# Patient Record
Sex: Female | Born: 1935 | Race: White | Hispanic: No
Health system: Southern US, Community
[De-identification: ages and names within clinical notes are randomized; demographics above are authoritative.]

## PROBLEM LIST (undated history)

## (undated) DIAGNOSIS — I429 Cardiomyopathy, unspecified: Secondary | ICD-10-CM

## (undated) DIAGNOSIS — Z950 Presence of cardiac pacemaker: Secondary | ICD-10-CM

## (undated) DIAGNOSIS — E119 Type 2 diabetes mellitus without complications: Secondary | ICD-10-CM

## (undated) DIAGNOSIS — G629 Polyneuropathy, unspecified: Secondary | ICD-10-CM

## (undated) DIAGNOSIS — K219 Gastro-esophageal reflux disease without esophagitis: Secondary | ICD-10-CM

## (undated) DIAGNOSIS — G454 Transient global amnesia: Secondary | ICD-10-CM

## (undated) DIAGNOSIS — R1031 Right lower quadrant pain: Principal | ICD-10-CM

## (undated) DIAGNOSIS — Z8719 Personal history of other diseases of the digestive system: Secondary | ICD-10-CM

## (undated) DIAGNOSIS — I10 Essential (primary) hypertension: Secondary | ICD-10-CM

## (undated) DIAGNOSIS — E785 Hyperlipidemia, unspecified: Secondary | ICD-10-CM

## (undated) DIAGNOSIS — I4891 Unspecified atrial fibrillation: Secondary | ICD-10-CM

## (undated) DIAGNOSIS — I6529 Occlusion and stenosis of unspecified carotid artery: Secondary | ICD-10-CM

## (undated) HISTORY — DX: Cardiomyopathy, unspecified: I42.9

## (undated) HISTORY — DX: Transient global amnesia: G45.4

## (undated) HISTORY — DX: Personal history of other diseases of the digestive system: Z87.19

## (undated) HISTORY — PX: ABDOMINAL HYSTERECTOMY: SHX81

## (undated) HISTORY — DX: Essential (primary) hypertension: I10

## (undated) HISTORY — DX: Right lower quadrant pain: R10.31

## (undated) HISTORY — DX: Gastro-esophageal reflux disease without esophagitis: K21.9

## (undated) HISTORY — PX: APPENDECTOMY: SHX54

## (undated) HISTORY — DX: Polyneuropathy, unspecified: G62.9

## (undated) HISTORY — PX: CHOLECYSTECTOMY: SHX55

## (undated) HISTORY — DX: Unspecified atrial fibrillation: I48.91

## (undated) HISTORY — DX: Hyperlipidemia, unspecified: E78.5

## (undated) HISTORY — PX: CARDIAC CATHETERIZATION: SHX172

## (undated) HISTORY — DX: Occlusion and stenosis of unspecified carotid artery: I65.29

## (undated) HISTORY — PX: CARPAL TUNNEL RELEASE: SHX101

---

## 1983-12-10 HISTORY — PX: BREAST SURGERY: SHX581

## 1998-05-31 ENCOUNTER — Other Ambulatory Visit: Admission: RE | Admit: 1998-05-31 | Discharge: 1998-05-31 | Payer: Self-pay | Admitting: Internal Medicine

## 1998-12-19 ENCOUNTER — Other Ambulatory Visit: Admission: RE | Admit: 1998-12-19 | Discharge: 1998-12-19 | Payer: Self-pay | Admitting: Obstetrics and Gynecology

## 1999-08-01 ENCOUNTER — Ambulatory Visit (HOSPITAL_COMMUNITY): Admission: RE | Admit: 1999-08-01 | Discharge: 1999-08-01 | Payer: Self-pay | Admitting: Gastroenterology

## 2000-04-02 ENCOUNTER — Other Ambulatory Visit: Admission: RE | Admit: 2000-04-02 | Discharge: 2000-04-02 | Payer: Self-pay | Admitting: Obstetrics and Gynecology

## 2000-05-20 ENCOUNTER — Ambulatory Visit (HOSPITAL_COMMUNITY): Admission: RE | Admit: 2000-05-20 | Discharge: 2000-05-20 | Payer: Self-pay | Admitting: Family Medicine

## 2000-05-20 ENCOUNTER — Encounter: Payer: Self-pay | Admitting: Family Medicine

## 2001-12-16 ENCOUNTER — Ambulatory Visit (HOSPITAL_COMMUNITY): Admission: RE | Admit: 2001-12-16 | Discharge: 2001-12-16 | Payer: Self-pay | Admitting: Gastroenterology

## 2002-11-24 ENCOUNTER — Other Ambulatory Visit: Admission: RE | Admit: 2002-11-24 | Discharge: 2002-11-24 | Payer: Self-pay | Admitting: Obstetrics and Gynecology

## 2003-01-31 ENCOUNTER — Ambulatory Visit (HOSPITAL_COMMUNITY): Admission: RE | Admit: 2003-01-31 | Discharge: 2003-01-31 | Payer: Self-pay | Admitting: Gastroenterology

## 2003-04-14 ENCOUNTER — Encounter: Payer: Self-pay | Admitting: Cardiology

## 2003-04-14 ENCOUNTER — Ambulatory Visit (HOSPITAL_COMMUNITY): Admission: RE | Admit: 2003-04-14 | Discharge: 2003-04-14 | Payer: Self-pay | Admitting: Cardiology

## 2003-06-16 ENCOUNTER — Emergency Department (HOSPITAL_COMMUNITY): Admission: EM | Admit: 2003-06-16 | Discharge: 2003-06-16 | Payer: Self-pay | Admitting: Emergency Medicine

## 2003-08-08 ENCOUNTER — Ambulatory Visit (HOSPITAL_COMMUNITY): Admission: RE | Admit: 2003-08-08 | Discharge: 2003-08-08 | Payer: Self-pay | Admitting: *Deleted

## 2003-09-30 ENCOUNTER — Ambulatory Visit (HOSPITAL_COMMUNITY): Admission: RE | Admit: 2003-09-30 | Discharge: 2003-09-30 | Payer: Self-pay | Admitting: Internal Medicine

## 2003-09-30 ENCOUNTER — Encounter: Payer: Self-pay | Admitting: Internal Medicine

## 2004-01-06 ENCOUNTER — Other Ambulatory Visit: Admission: RE | Admit: 2004-01-06 | Discharge: 2004-01-06 | Payer: Self-pay | Admitting: Obstetrics and Gynecology

## 2004-03-23 ENCOUNTER — Ambulatory Visit (HOSPITAL_COMMUNITY): Admission: RE | Admit: 2004-03-23 | Discharge: 2004-03-23 | Payer: Self-pay | Admitting: Cardiology

## 2004-03-23 ENCOUNTER — Encounter (INDEPENDENT_AMBULATORY_CARE_PROVIDER_SITE_OTHER): Payer: Self-pay | Admitting: Cardiology

## 2004-09-07 ENCOUNTER — Ambulatory Visit: Payer: Self-pay | Admitting: Cardiology

## 2004-09-07 ENCOUNTER — Inpatient Hospital Stay (HOSPITAL_COMMUNITY): Admission: EM | Admit: 2004-09-07 | Discharge: 2004-09-14 | Payer: Self-pay | Admitting: *Deleted

## 2004-09-08 HISTORY — PX: CARDIAC DEFIBRILLATOR PLACEMENT: SHX171

## 2004-09-10 ENCOUNTER — Encounter (INDEPENDENT_AMBULATORY_CARE_PROVIDER_SITE_OTHER): Payer: Self-pay | Admitting: Cardiology

## 2004-12-19 ENCOUNTER — Ambulatory Visit: Payer: Self-pay | Admitting: Internal Medicine

## 2005-01-17 ENCOUNTER — Ambulatory Visit (HOSPITAL_COMMUNITY): Admission: RE | Admit: 2005-01-17 | Discharge: 2005-01-17 | Payer: Self-pay | Admitting: Obstetrics and Gynecology

## 2005-07-11 ENCOUNTER — Emergency Department (HOSPITAL_COMMUNITY): Admission: EM | Admit: 2005-07-11 | Discharge: 2005-07-11 | Payer: Self-pay | Admitting: Emergency Medicine

## 2005-09-27 ENCOUNTER — Encounter: Admission: RE | Admit: 2005-09-27 | Discharge: 2005-09-27 | Payer: Self-pay | Admitting: Cardiology

## 2005-12-17 ENCOUNTER — Ambulatory Visit: Payer: Self-pay | Admitting: Internal Medicine

## 2006-02-24 ENCOUNTER — Ambulatory Visit: Payer: Self-pay | Admitting: Gastroenterology

## 2006-03-03 ENCOUNTER — Ambulatory Visit (HOSPITAL_COMMUNITY): Admission: RE | Admit: 2006-03-03 | Discharge: 2006-03-03 | Payer: Self-pay | Admitting: Obstetrics and Gynecology

## 2006-10-14 ENCOUNTER — Ambulatory Visit: Payer: Self-pay | Admitting: Gastroenterology

## 2006-10-15 ENCOUNTER — Ambulatory Visit: Payer: Self-pay | Admitting: Gastroenterology

## 2006-10-19 ENCOUNTER — Emergency Department (HOSPITAL_COMMUNITY): Admission: EM | Admit: 2006-10-19 | Discharge: 2006-10-19 | Payer: Self-pay | Admitting: Family Medicine

## 2006-10-21 ENCOUNTER — Inpatient Hospital Stay (HOSPITAL_COMMUNITY): Admission: EM | Admit: 2006-10-21 | Discharge: 2006-10-23 | Payer: Self-pay | Admitting: Emergency Medicine

## 2006-10-22 ENCOUNTER — Encounter: Payer: Self-pay | Admitting: Vascular Surgery

## 2006-10-22 ENCOUNTER — Encounter (INDEPENDENT_AMBULATORY_CARE_PROVIDER_SITE_OTHER): Payer: Self-pay | Admitting: Cardiology

## 2006-10-28 ENCOUNTER — Emergency Department (HOSPITAL_COMMUNITY): Admission: EM | Admit: 2006-10-28 | Discharge: 2006-10-28 | Payer: Self-pay | Admitting: Emergency Medicine

## 2007-02-17 ENCOUNTER — Ambulatory Visit: Payer: Self-pay | Admitting: Gastroenterology

## 2007-02-18 ENCOUNTER — Ambulatory Visit (HOSPITAL_COMMUNITY): Admission: RE | Admit: 2007-02-18 | Discharge: 2007-02-18 | Payer: Self-pay | Admitting: Internal Medicine

## 2007-02-23 ENCOUNTER — Emergency Department (HOSPITAL_COMMUNITY): Admission: EM | Admit: 2007-02-23 | Discharge: 2007-02-23 | Payer: Self-pay | Admitting: Emergency Medicine

## 2007-04-03 ENCOUNTER — Ambulatory Visit: Payer: Self-pay | Admitting: Internal Medicine

## 2007-04-03 LAB — CONVERTED CEMR LAB
Basophils Absolute: 0 10*3/uL (ref 0.0–0.1)
Basophils Relative: 0.8 % (ref 0.0–1.0)
Chloride: 110 meq/L (ref 96–112)
Creatinine, Ser: 1.3 mg/dL — ABNORMAL HIGH (ref 0.4–1.2)
Eosinophils Relative: 2.5 % (ref 0.0–5.0)
HCT: 33.9 % — ABNORMAL LOW (ref 36.0–46.0)
INR: 1 (ref 0.9–2.0)
MCHC: 35.8 g/dL (ref 30.0–36.0)
Neutrophils Relative %: 53.4 % (ref 43.0–77.0)
Prothrombin Time: 12.1 s (ref 10.0–14.0)
RBC: 3.72 M/uL — ABNORMAL LOW (ref 3.87–5.11)
RDW: 12.2 % (ref 11.5–14.6)
Sodium: 147 meq/L — ABNORMAL HIGH (ref 135–145)
WBC: 5.9 10*3/uL (ref 4.5–10.5)
aPTT: 28.7 s (ref 26.5–36.5)

## 2007-04-06 ENCOUNTER — Ambulatory Visit (HOSPITAL_COMMUNITY): Admission: RE | Admit: 2007-04-06 | Discharge: 2007-04-06 | Payer: Self-pay | Admitting: Internal Medicine

## 2007-04-06 ENCOUNTER — Ambulatory Visit: Payer: Self-pay | Admitting: Internal Medicine

## 2007-04-20 ENCOUNTER — Ambulatory Visit: Payer: Self-pay

## 2007-06-17 ENCOUNTER — Ambulatory Visit: Payer: Self-pay

## 2007-07-14 ENCOUNTER — Ambulatory Visit: Payer: Self-pay | Admitting: Internal Medicine

## 2007-09-17 ENCOUNTER — Ambulatory Visit: Payer: Self-pay | Admitting: Internal Medicine

## 2008-02-23 ENCOUNTER — Ambulatory Visit: Payer: Self-pay | Admitting: Gastroenterology

## 2008-03-01 ENCOUNTER — Ambulatory Visit: Payer: Self-pay | Admitting: Gastroenterology

## 2008-03-29 ENCOUNTER — Ambulatory Visit: Payer: Self-pay | Admitting: Internal Medicine

## 2008-07-29 ENCOUNTER — Ambulatory Visit (HOSPITAL_COMMUNITY): Admission: RE | Admit: 2008-07-29 | Discharge: 2008-07-29 | Payer: Self-pay | Admitting: Internal Medicine

## 2009-01-19 ENCOUNTER — Encounter: Payer: Self-pay | Admitting: Internal Medicine

## 2009-04-26 ENCOUNTER — Encounter: Admission: RE | Admit: 2009-04-26 | Discharge: 2009-04-26 | Payer: Self-pay | Admitting: Cardiology

## 2009-05-02 DIAGNOSIS — I420 Dilated cardiomyopathy: Secondary | ICD-10-CM | POA: Insufficient documentation

## 2009-05-02 DIAGNOSIS — I6529 Occlusion and stenosis of unspecified carotid artery: Secondary | ICD-10-CM | POA: Insufficient documentation

## 2009-05-02 DIAGNOSIS — Z95810 Presence of automatic (implantable) cardiac defibrillator: Secondary | ICD-10-CM | POA: Insufficient documentation

## 2009-05-03 ENCOUNTER — Ambulatory Visit: Payer: Self-pay | Admitting: Internal Medicine

## 2009-08-29 ENCOUNTER — Encounter: Payer: Self-pay | Admitting: Internal Medicine

## 2009-09-25 ENCOUNTER — Encounter: Payer: Self-pay | Admitting: Internal Medicine

## 2009-10-26 ENCOUNTER — Telehealth (INDEPENDENT_AMBULATORY_CARE_PROVIDER_SITE_OTHER): Payer: Self-pay | Admitting: *Deleted

## 2009-10-26 ENCOUNTER — Telehealth: Payer: Self-pay | Admitting: Internal Medicine

## 2009-10-27 ENCOUNTER — Ambulatory Visit: Payer: Self-pay | Admitting: Internal Medicine

## 2009-11-10 ENCOUNTER — Encounter: Payer: Self-pay | Admitting: Internal Medicine

## 2009-11-13 ENCOUNTER — Ambulatory Visit: Payer: Self-pay | Admitting: Internal Medicine

## 2009-11-13 DIAGNOSIS — I4460 Unspecified fascicular block: Secondary | ICD-10-CM | POA: Insufficient documentation

## 2009-11-13 DIAGNOSIS — I5022 Chronic systolic (congestive) heart failure: Secondary | ICD-10-CM | POA: Insufficient documentation

## 2009-11-13 LAB — CONVERTED CEMR LAB
Basophils Relative: 1 % (ref 0.0–3.0)
CO2: 30 meq/L (ref 19–32)
Chloride: 107 meq/L (ref 96–112)
Creatinine, Ser: 1.2 mg/dL (ref 0.4–1.2)
Eosinophils Absolute: 0.1 10*3/uL (ref 0.0–0.7)
Hemoglobin: 13.4 g/dL (ref 12.0–15.0)
MCHC: 34.1 g/dL (ref 30.0–36.0)
MCV: 93.5 fL (ref 78.0–100.0)
Monocytes Absolute: 0.5 10*3/uL (ref 0.1–1.0)
Neutro Abs: 3.5 10*3/uL (ref 1.4–7.7)
RBC: 4.2 M/uL (ref 3.87–5.11)
Sodium: 143 meq/L (ref 135–145)

## 2010-02-16 ENCOUNTER — Ambulatory Visit: Payer: Self-pay | Admitting: Vascular Surgery

## 2010-04-11 ENCOUNTER — Encounter: Payer: Self-pay | Admitting: Internal Medicine

## 2010-04-17 ENCOUNTER — Ambulatory Visit: Payer: Self-pay | Admitting: Internal Medicine

## 2010-04-20 ENCOUNTER — Encounter: Payer: Self-pay | Admitting: Internal Medicine

## 2010-05-01 ENCOUNTER — Encounter: Admission: RE | Admit: 2010-05-01 | Discharge: 2010-05-01 | Payer: Self-pay | Admitting: Neurology

## 2010-07-20 ENCOUNTER — Encounter (INDEPENDENT_AMBULATORY_CARE_PROVIDER_SITE_OTHER): Payer: Self-pay | Admitting: *Deleted

## 2010-12-05 ENCOUNTER — Encounter (INDEPENDENT_AMBULATORY_CARE_PROVIDER_SITE_OTHER): Payer: Self-pay | Admitting: *Deleted

## 2011-01-08 NOTE — Letter (Signed)
Summary: Device-Delinquent Phone Journalist, newspaper, Main Office  [address]   [place], [place] [address]   Phone: [phone]  Fax: [phone]     July 20, 2010 MRN: [id number]   [facility] [address] [place], [place]  [address]   Dear Ms. ZENT,  According to our records, you were scheduled for a device phone transmission on         07/19/2010.                     .     We did not receive any results from this check.  If you transmitted on your scheduled day, please call us to help troubleshoot your system.  If you forgot to send your transmission, please send one upon receipt of this letter.  Thank you, Altha Harm, LPN  July 20, 2010 11:02 AM   [facility]

## 2011-01-08 NOTE — Progress Notes (Signed)
Summary: Med List   Med List   Imported By: Roderic Ovens 04/27/2010 16:38:22  _____________________________________________________________________  External Attachment:    Type:   Image     Comment:   External Document

## 2011-01-08 NOTE — Miscellaneous (Signed)
  Clinical Lists Changes  Medications: Changed medication from CARVEDILOL 25 MG TABS (CARVEDILOL) Take one-half tablet by mouth twice a day only on M, w,F to CARVEDILOL 25 MG TABS (CARVEDILOL) Take one-half tablet by mouth twice a day Added new medication of LOSARTAN POTASSIUM 50 MG TABS (LOSARTAN POTASSIUM) Take one tablet by mouth once daily. Removed medication of MICARDIS 40 MG TABS (TELMISARTAN) Take one tablet by mouth daily Changed medication from PRAVASTATIN SODIUM 20 MG TABS (PRAVASTATIN SODIUM) Take one-half tablet by mouth daily at bedtime only on m,w,f. to PRAVASTATIN SODIUM 40 MG TABS (PRAVASTATIN SODIUM) Take 1/2 tablet by mouth daily at bedtime Added new medication of * BUMETANIDE 1/2 tablet monday + Thursday Added new medication of VITAMIN D 1000 UNIT TABS (CHOLECALCIFEROL) 2 tabs a day Added new medication of ASPIRIN 81 MG TBEC (ASPIRIN) Take one tablet by mouth daily Added new medication of FISH OIL   OIL (FISH OIL) 1000mg   2 a day Added new medication of RANITIDINE HCL 300 MG CAPS (RANITIDINE HCL) two times a day

## 2011-01-08 NOTE — Assessment & Plan Note (Signed)
Summary: pc2 medtronic    Visit Type:  Follow-up Referring Provider:  Dr.Tilley Primary Provider:  Dr.Shaw   History of Present Illness: Mrs. Ruffner returns today for follow-up.  She is a very pleasant, elderly woman with a nonischemic cardiomyopathy, congestive heart failure, status post prophylactic ICD insertion.  She also has a history of SVT.  Over the past several months, she has been stable.  I had initially seen her for Dr. Donnie Aho several months ago and consideration for BiV upgrade was made as she has LBBB.  She has not had any ICD therapies.  No significant chest pain.  Her CHF is class 2.  Allergies: No Known Drug Allergies  Past History:  Past Medical History: Last updated: 05/02/2009 AUTOMATIC IMPLANTABLE CARDIAC DEFIBRILLATOR SITU (ICD-V45.02) CARDIOMYOPATHY, SECONDARY (ICD-425.9) CAROTID STENOSIS (ICD-433.10) VASOVAGAL SYNCOPE (ICD-780.2)  Past Surgical History: Last updated: 05/02/2009  Dual-chamber defibrillator implantation  09/13/2004   Duke Salvia, M.D removal of previously implanted  Medtronic ICD, insertion of a new device :  04/06/2007   Doylene Canning. Ladona Ridgel, MD    Tilt table study with and without isoproterenol.  09/11/2004  Elmore Guise.  Cardiac catheterization  04/14/2003  Review of Systems  The patient denies chest pain, syncope, dyspnea on exertion, and peripheral edema.    Vital Signs:  Patient profile:   77 year old female Height:      63 inches Weight:      148 pounds BMI:     26.31 Pulse rate:   73 / minute BP sitting:   150 / 62  (left arm)  Vitals Entered By: Laurance Flatten CMA (Apr 17, 2010 3:57 PM) Comments Unable to confirm medications. Patient does not have correct medication information, nor correct pharmacy information.   Physical Exam  General:  Well developed, well nourished, in no acute distress.  HEENT: normal Neck: supple. No JVD. Carotids 2+ bilaterally no bruits Cor: RRR no rubs, gallops or murmur. PMI is enlarged  and laterally displaced. Lungs: CTA. ICD incision is well healed. Ab: soft, nontender. nondistended. No HSM. Good bowel sounds Ext: warm. no cyanosis, clubbing or edema Neuro: alert and oriented. Grossly nonfocal. affect pleasant     ICD Specifications Following MD:  Lewayne Bunting, MD     Referring MD:  Donnie Aho ICD Vendor:  Medtronic     ICD Model Number:  [id number]     ICD Serial Number:  [id number] ICD DOI:  04/06/2007     ICD Implanting MD:  Sherryl Manges, MD  Lead 1:    Location: RA     DOI: 09/14/2004     Model #: [id number]     Serial #: [id number]     Status: active Lead 2:    Location: RV     DOI: 09/14/2004     Model #: [id number]     Serial #: [id number]     Status: active  Indications::  NICM   ICD Follow Up Battery Voltage:  3.00 V     Charge Time:  9.0 seconds     Underlying rhythm:  SR ICD Dependent:  No       ICD Device Measurements Atrium:  Amplitude: 1.4 mV, Impedance: 520 ohms, Threshold: 0.5 V at 0.4 msec Right Ventricle:  Amplitude: 19.7 mV, Impedance: 568 ohms, Threshold: 0.5 V at 0.4 msec Shock Impedance: 47/62 ohms   Episodes MS Episodes:  0     Percent Mode Switch:  0     Shock:  0  ATP:  0     Nonsustained:  1     Atrial Therapies:  0 Atrial Pacing:  4.6%     Ventricular Pacing:  0.1%  Brady Parameters Mode DDD+     Lower Rate Limit:  60     Upper Rate Limit 130 PAV 180     Sensed AV Delay:  150  Tachy Zones VF:  231     VT1:  200     Next Cardiology Appt Due:  04/09/2011 Tech Comments:  1 NST EPISODE LASTING 3 SECONDS.  NORMAL DEVICE FUNCTION.  NO CHANGES MADE.  CARELINK CHECKS TO DR Donnie Aho. ANNUAL CHECKS W/DR Ladona Ridgel.  PT GIVEN LETTER AND MAGNET.  Vella Kohler  Apr 17, 2010 4:05 PM MD Comments:  Agree with above.  Impression & Recommendations:  Problem # 1:  AUTOMATIC IMPLANTABLE CARDIAC DEFIBRILLATOR SITU (ICD-V45.02) Her device is working normally. Will recheck in several months.  Problem # 2:  CHRONIC SYSTOLIC HEART FAILURE (ICD-428.22) At this point I  do not think her CHF symptoms are severe enough to warrant up grade to a BiV ICD.  Will follow, with plans for BiV upgrade only for worsening symptoms. Her updated medication list for this problem includes:    Carvedilol 25 Mg Tabs (Carvedilol) .Marland Kitchen... Take one-half tablet by mouth twice a day only on m, w,f    Micardis 40 Mg Tabs (Telmisartan) .Marland Kitchen... Take one tablet by mouth daily    Aspirin 81 Mg Tbec (Aspirin) .Marland Kitchen... Take one tablet by mouth daily  Patient Instructions: 1)  Your physician recommends that you schedule a follow-up appointment in: 12 months.

## 2011-01-08 NOTE — Cardiovascular Report (Signed)
Summary: Pre-Op Orders  Pre-Op Orders   Imported By: Marylou Mccoy 12/15/2009 12:34:37  _____________________________________________________________________  External Attachment:    Type:   Image     Comment:   External Document

## 2011-01-08 NOTE — Cardiovascular Report (Signed)
Summary: Office Visit   Office Visit   Imported By: Roderic Ovens 04/23/2010 16:41:14  _____________________________________________________________________  External Attachment:    Type:   Image     Comment:   External Document

## 2011-01-09 ENCOUNTER — Telehealth: Payer: Self-pay | Admitting: Internal Medicine

## 2011-01-10 NOTE — Letter (Signed)
Summary: Device-Delinquent Phone Journalist, newspaper, Main Office  [address]   [place], [place] [address]   Phone: [phone]  Fax: [phone]     December 05, 2010 MRN: [id number]   [facility] [address] [place], [place]  [address]   Dear Ms. PLACIDE,  According to our records, you were scheduled for a device phone transmission on  07-19-2010.     We did not receive any results from this check.  If you transmitted on your scheduled day, please call us to help troubleshoot your system.  If you forgot to send your transmission, please send one upon receipt of this letter.  Thank you,  Vella Kohler  December 05, 2010 12:03 PM   [facility] certified

## 2011-01-16 NOTE — Progress Notes (Signed)
Summary: left arm feels tried and feel funny whe she lay down   Phone Note Call from Patient Call back at Home Phone [phone]   Caller: Patient Summary of Call: Pt has a funny feeling at left breast and left arm feel tried Initial call taken by: Judie Grieve,  January 09, 2011 2:59 PM  Follow-up for Phone Call        Discussed with Dr Ladona Ridgel.  Does not think has to do with her device  This has been going on for 3 months  Talked with pt and if continues she will follow up with Dr Lin Landsman, RN, BSN  January 09, 2011 3:26 PM

## 2011-04-07 ENCOUNTER — Emergency Department (HOSPITAL_COMMUNITY): Payer: Medicare Other

## 2011-04-07 ENCOUNTER — Inpatient Hospital Stay (HOSPITAL_COMMUNITY)
Admission: EM | Admit: 2011-04-07 | Discharge: 2011-04-08 | DRG: 312 | Disposition: A | Discharge status: Home / Self Care | Payer: Medicare Other | Attending: Internal Medicine | Admitting: Internal Medicine

## 2011-04-07 DIAGNOSIS — Z95810 Presence of automatic (implantable) cardiac defibrillator: Secondary | ICD-10-CM

## 2011-04-07 DIAGNOSIS — Z860100 Personal history of colon polyps, unspecified: Secondary | ICD-10-CM

## 2011-04-07 DIAGNOSIS — Y9229 Other specified public building as the place of occurrence of the external cause: Secondary | ICD-10-CM

## 2011-04-07 DIAGNOSIS — T465X5A Adverse effect of other antihypertensive drugs, initial encounter: Secondary | ICD-10-CM | POA: Diagnosis present

## 2011-04-07 DIAGNOSIS — E119 Type 2 diabetes mellitus without complications: Secondary | ICD-10-CM | POA: Diagnosis present

## 2011-04-07 DIAGNOSIS — T38895A Adverse effect of other hormones and synthetic substitutes, initial encounter: Secondary | ICD-10-CM | POA: Diagnosis present

## 2011-04-07 DIAGNOSIS — I428 Other cardiomyopathies: Secondary | ICD-10-CM | POA: Diagnosis present

## 2011-04-07 DIAGNOSIS — R55 Syncope and collapse: Principal | ICD-10-CM | POA: Diagnosis present

## 2011-04-07 DIAGNOSIS — K219 Gastro-esophageal reflux disease without esophagitis: Secondary | ICD-10-CM | POA: Diagnosis present

## 2011-04-07 DIAGNOSIS — N179 Acute kidney failure, unspecified: Secondary | ICD-10-CM | POA: Diagnosis present

## 2011-04-07 DIAGNOSIS — E785 Hyperlipidemia, unspecified: Secondary | ICD-10-CM | POA: Diagnosis present

## 2011-04-07 DIAGNOSIS — Z7982 Long term (current) use of aspirin: Secondary | ICD-10-CM

## 2011-04-07 DIAGNOSIS — Z823 Family history of stroke: Secondary | ICD-10-CM

## 2011-04-07 DIAGNOSIS — I10 Essential (primary) hypertension: Secondary | ICD-10-CM | POA: Diagnosis present

## 2011-04-07 DIAGNOSIS — Z8249 Family history of ischemic heart disease and other diseases of the circulatory system: Secondary | ICD-10-CM

## 2011-04-07 DIAGNOSIS — I447 Left bundle-branch block, unspecified: Secondary | ICD-10-CM | POA: Diagnosis present

## 2011-04-07 LAB — URINALYSIS, ROUTINE W REFLEX MICROSCOPIC
Bilirubin Urine: NEGATIVE
Nitrite: NEGATIVE
Specific Gravity, Urine: 1.02 (ref 1.005–1.030)
pH: 7.5 (ref 5.0–8.0)

## 2011-04-07 LAB — CBC
HCT: 41.9 % (ref 36.0–46.0)
MCV: 89.1 fL (ref 78.0–100.0)
Platelets: 220 10*3/uL (ref 150–400)
RBC: 4.7 MIL/uL (ref 3.87–5.11)
WBC: 11 10*3/uL — ABNORMAL HIGH (ref 4.0–10.5)

## 2011-04-07 LAB — DIFFERENTIAL
Eosinophils Absolute: 0.2 10*3/uL (ref 0.0–0.7)
Lymphocytes Relative: 17 % (ref 12–46)
Lymphs Abs: 1.9 10*3/uL (ref 0.7–4.0)
Neutrophils Relative %: 76 % (ref 43–77)

## 2011-04-07 LAB — POCT I-STAT, CHEM 8
Chloride: 105 mEq/L (ref 96–112)
HCT: 42 % (ref 36.0–46.0)
Potassium: 4.3 mEq/L (ref 3.5–5.1)

## 2011-04-07 LAB — CK TOTAL AND CKMB (NOT AT ARMC): CK, MB: 1.1 ng/mL (ref 0.3–4.0)

## 2011-04-07 LAB — GLUCOSE, CAPILLARY
Glucose-Capillary: 112 mg/dL — ABNORMAL HIGH (ref 70–99)
Glucose-Capillary: 135 mg/dL — ABNORMAL HIGH (ref 70–99)

## 2011-04-07 LAB — URINE MICROSCOPIC-ADD ON

## 2011-04-07 LAB — POCT CARDIAC MARKERS
CKMB, poc: 1.2 ng/mL (ref 1.0–8.0)
Troponin i, poc: <0.05 ng/mL (ref 0.00–0.09)

## 2011-04-08 LAB — BASIC METABOLIC PANEL
CO2: 24 mEq/L (ref 19–32)
Calcium: 8.7 mg/dL (ref 8.4–10.5)
Creatinine, Ser: 1.18 mg/dL (ref 0.4–1.2)
Glucose, Bld: 89 mg/dL (ref 70–99)

## 2011-04-08 LAB — CBC
HCT: 37.1 % (ref 36.0–46.0)
Hemoglobin: 12.7 g/dL (ref 12.0–15.0)
MCH: 30 pg (ref 26.0–34.0)
MCHC: 34.2 g/dL (ref 30.0–36.0)
MCV: 87.7 fL (ref 78.0–100.0)

## 2011-04-09 LAB — URINE CULTURE: Culture  Setup Time: 201204292126

## 2011-04-10 ENCOUNTER — Encounter: Payer: Self-pay | Admitting: Internal Medicine

## 2011-04-10 ENCOUNTER — Encounter: Payer: Self-pay | Admitting: *Deleted

## 2011-04-11 ENCOUNTER — Encounter: Payer: Self-pay | Admitting: Internal Medicine

## 2011-04-11 ENCOUNTER — Ambulatory Visit (INDEPENDENT_AMBULATORY_CARE_PROVIDER_SITE_OTHER): Payer: Medicare Other | Admitting: Internal Medicine

## 2011-04-11 DIAGNOSIS — I5022 Chronic systolic (congestive) heart failure: Secondary | ICD-10-CM

## 2011-04-11 DIAGNOSIS — Z95810 Presence of automatic (implantable) cardiac defibrillator: Secondary | ICD-10-CM

## 2011-04-11 DIAGNOSIS — R55 Syncope and collapse: Secondary | ICD-10-CM

## 2011-04-11 DIAGNOSIS — I428 Other cardiomyopathies: Secondary | ICD-10-CM

## 2011-04-11 NOTE — Assessment & Plan Note (Signed)
Her CHF symptoms appear to be class II. I've asked her to continue her current medications and maintain a low-sodium diet.

## 2011-04-11 NOTE — Assessment & Plan Note (Signed)
Her device is working normally. There've been no intercurrent ICD shocks. She has brief episodes of nonsustained VT which do not correlate with the timing of her recent episode of syncope.

## 2011-04-11 NOTE — Patient Instructions (Signed)
Your physician wants you to follow-up in: 12 months with Dr. Taylor. You will receive a reminder letter in the mail two months in advance. If you don't receive a letter, please call our office to schedule the follow-up appointment.    

## 2011-04-11 NOTE — Progress Notes (Signed)
HPI [name] returns today for followup. She has a history of cardiomyopathy, congestive heart failure, and is status post ICD implantation. She was recently hospitalized over night when she had a syncopal episode. The patient notes that she had been at church and was not feeling well. She got up to stretch her legs and get a breath of fresh air when she suddenly passed out. When she awoke she still felt poorly and she was taken to the emergency room. She was observed overnight and discharged the next day. She ruled out for MI and the etiology of her syncope was not well known. The patient does have an ICD in place but it did not fire.  No Known Allergies   Current Outpatient Prescriptions  Medication Sig Dispense Refill  . aspirin 81 MG tablet Take 81 mg by mouth daily.        Marland Kitchen b complex vitamins capsule Take 1 capsule by mouth 2 (two) times daily.       . bumetanide (BUMEX) 0.5 MG tablet 0.5 mg. Take 1/2 tablet Monday, Thursday, and Saturday      . carvedilol (COREG) 25 MG tablet 25 mg. Take 1/2 tablet twice daily       . Cholecalciferol (VITAMIN D3) 1000 UNITS CAPS Take 2,000 Units by mouth 2 (two) times daily.       . ciprofloxacin (CIPRO) 250 MG tablet Take 250 mg by mouth daily.        . Echinacea 400 MG CAPS Take by mouth.        . ezetimibe (ZETIA) 10 MG tablet Take 10 mg by mouth daily.        Marland Kitchen gabapentin (NEURONTIN) 100 MG tablet Take 100 mg by mouth daily.        Marland Kitchen losartan (COZAAR) 50 MG tablet Take 50 mg by mouth daily.        . Magnesium 250 MG TABS Take 1 tablet by mouth 3 (three) times daily.        . pregabalin (LYRICA) 75 MG capsule Take 75 mg by mouth daily.        . ranitidine (ZANTAC) 300 MG capsule Take 300 mg by mouth 2 (two) times daily.        . pravastatin (PRAVACHOL) 40 MG tablet Take 40 mg by mouth daily. Take 1/2 daily       . DISCONTD: fish oil-omega-3 fatty acids 1000 MG capsule Take 1,200 g by mouth daily.           Past Medical History  Diagnosis Date    . Secondary cardiomyopathy, unspecified   . Occlusion and stenosis of carotid artery without mention of cerebral infarction   . Syncope and collapse   . Hypertension   . GERD (gastroesophageal reflux disease)   . Hyperlipemia     ROS:   All systems reviewed and negative except as noted in the HPI.   Past Surgical History  Procedure Date  . Insert / replace / remove pacemaker     cardiac defibrillator situ 09/2004  . Cardiac catheterization     04/2003     No family history on file.   History   Social History  . Marital Status: Widowed    Spouse Name: N/A    Number of Children: N/A  . Years of Education: N/A   Occupational History  . Not on file.   Social History Main Topics  . Smoking status: Never Smoker   . Smokeless tobacco: Never Used  .  Alcohol Use: Not on file  . Drug Use: Not on file  . Sexually Active: Not on file   Other Topics Concern  . Not on file   Social History Narrative  . No narrative on file     BP 130/52  Pulse 68  Ht 5\' 3"  (1.6 m)  Wt 141 lb (63.957 kg)  BMI 24.98 kg/m2  Physical Exam:  Well appearing NAD HEENT: Unremarkable Neck:  No JVD, no thyromegally Lymphatics:  No adenopathy Back:  No CVA tenderness Lungs:  Clear. Well-healed ICD incision. HEART:  Regular rate rhythm, no murmurs, no rubs, no clicks Abd:  Flat, positive bowel sounds, no organomegally, no rebound, no guarding Ext:  2 plus pulses, no edema, no cyanosis, no clubbing Skin:  No rashes no nodules Neuro:  CN II through XII intact, motor grossly intact  DEVICE  Normal device function.  See PaceArt for details.   Assess/Plan:

## 2011-04-11 NOTE — Assessment & Plan Note (Signed)
I suspect a neurally mediated source to be underlying her most recent episode of syncope. We talked today about the physiology of her syncope and what she might do to prevent an episode.

## 2011-04-12 NOTE — H&P (Signed)
NAMEDAWT, REEB                ACCOUNT NO.:  [id number]  MEDICAL RECORD NO.:  [id number]           PATIENT TYPE:  I  LOCATION:  3707                         FACILITY:  [facility]  PHYSICIAN:  Gordy Savers, MDDATE OF BIRTH:  [birth year]  DATE OF ADMISSION:  04/07/2011 DATE OF DISCHARGE:                             HISTORY & PHYSICAL   CHIEF COMPLAINT:  Syncope.  HISTORY OF PRESENT ILLNESS:  The patient is an 77 year old patient who was stable until the day of admission.  While she was sitting at Sunday school, she became quite weak, dizzy, and slightly diaphoretic and flushed.  Due to the persistent symptoms, she stood and attempted to leave the facility and had a syncopal episode while standing.  She took three steps and was caught by a bystander and was laid to the ground without history of trauma.  At the present time, the patient is symptom free.  The patient's past medical history is remarkable for a history ofnonischemic cardiomyopathy and she is status post ICD placement in October 2005.  Also in October 2005, she was admitted following syncope and was noted to have a positive tilt table test at that time.  She was also admitted to hospital in November 2007 following syncope that was thought to be vasodepressor in etiology.  She has had a full cardiac and neurological evaluation in the past.  This has included MRI and brain MRA.  She has a history of mild carotid artery disease and underwent a carotid duplex study followup in March 2011.  This revealed a 1% to 39% bilateral carotid stenoses.  She is status post heart catheterization in May 2004.  The patient is now admitted for observation following her syncope.  PAST MEDICAL HISTORY:  As noted above, she does have a history of nonischemic RN myopathy with an ejection fraction in the 35% to 40% range.  She has a history of PSVT and prior syncope in the past.  More recently, this was evaluated in November 2007 and  thought to be vasodepressor syncope.  She has a chronic left bundle-branch block.  She has a history of dyslipidemia, hypertension, osteoarthritis.  She also has a history of colonic polyps, esophageal stricture disease, and gastroesophageal reflux disease.  SOCIAL HISTORY:  She has been widowed since 2000.  She has a daughter who has had a stroke and a son who had an MI at 80.  Nonsmoker, nondrinker.  FAMILY HISTORY:  Mother died at 29 of dementia.  Father, history of pacemaker insertion.  One brother had an MI at age 43.  Surgical procedures have included a cholecystectomy, appendectomy, hysterectomy.  MEDICAL REGIMEN: 1. Zetia 10 mg daily. 2. Aspirin. 3. Coreg 25 mg b.i.d. 4. Micardis 40 mg daily. 5. Pravachol 40 mg daily.  These medications were obtained from office notes obtained via e-chart. The patient is unaware of her present medications other than aspirin and Zetia.  No known allergies.  REVIEW OF SYSTEMS:  GENERAL:  Exam is unremarkable except as mentioned in the history of present illness.  There has been no recent fever, chills, sweats, or weight loss.  HEENT:  No headache, visual disturbances, hearing or vision loss.  SKIN:  No rash.  CARDIOPULMONARY: Positive for syncope.  Denies any recent palpitations, orthopnea, cough, URI symptoms.  Her pacemaker was interrogated in the ED and revealed no significant findings.  GENITOURINARY:  Noted to have mild pyuria in the ED.  She denies any urinary frequency, urgency, dysuria, or hematuria. GI:  Positive for gastroesophageal reflux disease with stricture formation in the past.  Denies any swallowing difficulty, change in bowel habits, or abdominal pain.  ENDOCRINE:  No polyuria, polydipsia, heat or cold intolerance.  PHYSICAL EXAMINATION:  VITAL SIGNS:  Temperature 98.6, blood pressure 130/50, heart rate 63-69, respiratory rate 20-22, O2 saturation 100% on room air. GENERAL:  A well-developed elderly female who is  alert, cooperative in no distress. SKIN:  Warm and dry without rash.  No signs of trauma. HEENT:  Normal pupil responses.  Conjunctiva clear.  ENT unremarkable. Oropharynx benign. NECK:  Right carotid bruit.  There is no neck vein distention, adenopathy, or thyroid enlargement. CHEST:  Clear. CARDIOVASCULAR:  Normal S1 and S2.  Rhythm was irregular.  No significant murmur or gallop noted. CHEST:  Clear to auscultation. GI:  Soft, nontender.  No organomegaly. MUSCULOSKELETAL:  No edema, effusions.  Pedal pulses were full except for an absent right dorsalis pedis pulse. NEUROLOGIC:  The patient alert and oriented with normal speech.  Cranial nerve exam revealed normal pupil responses.  Conjunctiva clear. Extraocular muscles were intact.  There is no facial asymmetry.  Tongue and uvula were midline.  Shoulder shrug was normal.  There is no drift to the outstretched arms.  Finger-to-nose and heel-to-shin testing normal.  Reflexes were symmetrical without pathological reflexes.  Radiographic studies included a chest x-ray that revealed cardiomegaly and no active disease.  LABORATORY STUDIES:  White count 11.0, hemoglobin 14.2, hematocrit 41.9. Chemistries were unremarkable.  BUN was slightly elevated at 31, creatinine 1.6.  Random blood sugar 93.  Urinalysis revealed trace pyuria, specific gravity was 10-20.  IMPRESSION: 1. Syncope, probable vasodepressor.  She has had a similar episode in     the past, may be aggravated by beta-blocker and antihypertensive     therapy.  Her BUN and creatinine and specific gravity are slightly     elevated, may be mildly volume contracted.  The patient did receive     intravenous fluids in the emergency department. 2. Nonischemic cardiomyopathy status post implantable cardioverter-     defibrillator, history of paroxysmal supraventricular tachycardia     and chronic left bundle-branch block. 3. Hypertension.  We will admit to a telemetry setting and  observe.     The patient will have a liberal diet with     liberal fluid intake.  The patient has received intravenous fluids     in the emergency department.  We will check some orthostatic blood     pressures and cycle some enzymes.  We will consider discharge on     April 08, 2011, if stable.     Gordy Savers, MD     PFK/MEDQ  D:  04/07/2011  T:  04/08/2011  Job:  [id number]  Electronically Signed by Eleonore Chiquito MD on 04/12/2011 01:09:20 PM

## 2011-04-23 NOTE — Assessment & Plan Note (Signed)
[facility]                         ELECTROPHYSIOLOGY OFFICE NOTE   NAME:RUMLEYCarmesha, [place]                       MRN:          [id number]  DATE:03/29/2008                            DOB:          12/27/1924    [name] returns today for follow-up.  She is a very pleasant,  elderly woman with a nonischemic cardiomyopathy, congestive heart  failure, status post prophylactic ICD insertion.  She also has a history  of SVT.  She denies chest pain or shortness of breath.  She had one  episode of dizziness, but otherwise has been stable in the last year.   MEDICATIONS:  1. Carvedilol 25 twice a day.  2. Micardis 40 a day.  .  3. Bumetanide daily.  4. Zetia 10 mg daily.  5. Pravachol 40 mg daily for pain.  6. Resolve of 40 a day.  7. Aspirin daily.   PHYSICAL EXAMINATION:  GENERAL:  She is a pleasant, well-appearing,  elderly woman in no distress.  VITAL SIGNS:  Blood pressure is 153/71, pulse 67 and regular,  respirations were 18.  Weight was 154 pounds.  NECK:  No jugular distention.  LUNGS:  Clear bilaterally to auscultation.  No wheezes, rales or rhonchi  are present.  CARDIAC:  Regular rate and rhythm with no murmurs, rubs or gallops  present.  EXTREMITIES:  Demonstrated no edema.   Interrogation of the defibrillator demonstrates Medtronic EnTrust with P  and R waves of 2 and 16, impedance 496 in the A, 576 in the V.  Threshold 0.5 at 0.4, both in the right atrium and the right ventricle.  There are no episodes of VT noted.  She was A pacing 5% the time.   IMPRESSION:  1. Nonischemic cardiomyopathy.  2. Congestive heart failure.  3. Status post implantable cardioverter defibrillator insertion.   DISCUSSION:  Overall [name] is stable.  Her defibrillator is  working normally.  Her heart failure is well-controlled.  Will see her  back in the office in 1 year.  She will follow up with Dr. Donnie Aho in the  interim     Doylene Canning. Ladona Ridgel,  MD  Electronically Signed    GWT/MedQ  DD: 03/29/2008  DT: 03/29/2008  Job #: [id number]   cc:   Georga Hacking, M.D.

## 2011-04-23 NOTE — Op Note (Signed)
[name], [name] NO.:  [id number]   MEDICAL RECORD NO.:  [id number]          PATIENT TYPE:  OIB   LOCATION:  2899                         FACILITY:  [facility]   PHYSICIAN:  Doylene Canning. Ladona Ridgel, MD    DATE OF BIRTH:  04-Nov-1925   DATE OF PROCEDURE:  04/06/2007  DATE OF DISCHARGE:                               OPERATIVE REPORT   PROCEDURE PERFORMED:  Removal of previously implanted Medtronic Entrust  cardioverter-defibrillator which had reached the ERI prematurely, and  insertion of a new Medtronic defibrillator with internal cardioverter  defibrillator pocket revision.   INTRODUCTION:  The patient is a very pleasant, 77 year old woman with a  history of congestive heart failure, left bundle branch block and  syncope.  She has a nonischemic cardiomyopathy with EF 25%.  She is  status post ICD insertion.  She was subsequently found to have premature  battery depletion with the device region reaching ERI 2-1/2 years after  initial implant.  She had not had multiple shocks.  She is now referred  for ICD generator change.   PROCEDURE:  After informed consent was obtained, the patient was taken  to the diagnostic EP lab in a fasting state.  She received the usual  preparation and draping, and intravenous fentanyl and midazolam was  given for sedation.  Thirty mL of lidocaine was infiltrated into the  left infraclavicular region.  A 7-cm incision was carried out over the  old ICD insertion site and electrocautery utilized to dissect down to  the previous ICD pocket.  The generator was removed with gentle traction  without difficulty.  P and R waves were measured at 2 and 16, the  impedance 536 in the atrium, 608 in the ventricle.  The threshold was  0.4 in the atrium and 0.5 volts in the right ventricle.  With these  satisfactory parameters, the new Medtronic Entrust model D154ATG, serial  number [id number], dual-chamber ICD was connected back to the atrial and  ventricular leads and placed back in the subcutaneous pocket.  The  pocket was irrigated with copious amounts of kanamycin irrigation.  The  incision was closed with a layer of 2-0 Vicryl followed by a layer of 3-  0 Vicryl, followed by a layer of 4-0 Vicryl.  The patient was sedated  deeply with intravenous fentanyl and defibrillation threshold tests  carried out.  Ventricular fibrillation was induced with T-wave shock and  a 15-joule shock was delivered which terminated the VF and restored  sinus rhythm.  With previous satisfactory ICD testing, there was no  additional testing carried out, and benzoin was painted on the skin.  Steri-Strips were applied and a pressure dressing was placed.  The  patient was returned to her room in satisfactory condition.  There were  no complications of the procedure.   RESULTS:  This demonstrates successful removal of previously implanted  Medtronic ICD, insertion of a new device without immediate procedure  complications.      Doylene Canning. Ladona Ridgel, MD  Electronically Signed    GWT/MEDQ  D:  04/06/2007  T:  04/06/2007  Job:  [id number]   cc:   Georga Hacking, M.D.  Kirk Ruths, M.D.  Duke Salvia, MD, [facility]

## 2011-04-23 NOTE — Assessment & Plan Note (Signed)
[facility]                         ELECTROPHYSIOLOGY OFFICE NOTE   NAME:RUMLEYBridgitt, Raggio                       MRN:          [id number]  DATE:04/20/2007                            DOB:          1925/05/02    REASON FOR VISIT:  Ms. Drenning was seen today in the device clinic for  followup of her newly implanted Medtronic Entrust defibrillator.  Her  device was implanted on April 06, 2007 for nonischemic cardiomyopathy.  Interrogation of her device demonstrates P waves of 1.7 millivolts and  impedence of 544 ohms with a threshold of 0.5 volts at 0.4 milliseconds.  She had R waves of 17.1 millivolts with an impedence of 608 ohms with a  threshold of 0.5 volts at 0.4 milliseconds.  Her RV shock impedence was  44 ohms and her SVC shock impedence was 63 ohms.  Her battery voltage  was 3.16 volts with a charge time of 8.7 seconds.  She was in normal  sinus rhythm.  Her device demonstrated no episodes of arrhythmia's.  She  was programmed with a VT zone of 162 beats per minute and a VF zone of  200 beats per minute.  MVP is programmed with a low rate of 60 and an  upper rate of 130.  Her paced AV is 180 milliseconds and her sensed AV  is 150 milliseconds.  Ms. Holsclaw did have an old [phone] lead and those  alerts were programmed for her new device.  She has an appointment on  July 28, 2007 to come back to see Dr. Ladona Ridgel in the device clinic.  Her incision was checked today and shows no redness or swelling and her  Steri-Strips had already been removed.      Gypsy Balsam, RN,BSN  Electronically Signed      Doylene Canning. Ladona Ridgel, MD  Electronically Signed   AS/MedQ  DD: 04/20/2007  DT: 04/20/2007  Job #: [id number]

## 2011-04-23 NOTE — Assessment & Plan Note (Signed)
[facility]                         ELECTROPHYSIOLOGY OFFICE NOTE   NAME:[name], [name]                       MRN:          [id number]  DATE:07/14/2007                            DOB:          1925/07/19    Ms. Gingras returns today for followup.  She is a very pleasant woman  with a history of nonischemic cardiomyopathy, Class II heart failure and  is status post ICD insertion.  She recently received an ICD discharge.  She, of course, had early depletion of her initial device 2-1/2 years  after implant and subsequently had a replacement device placed back in  March.  She denies chest pain.  She has been working very diligently in  her garden putting up vegetables.  She has had no chest pain or  shortness of breath.  The one defibrillator shock she received was back  early in July, which occurred secondary to an atrial tachycardia.  The  tachycardia was, in fact, terminated with ICD discharge.  Since then she  has felt well.   MEDICATIONS:  1. Carvedilol 25 twice daily.  2. Pravastatin 40 a day.  3. Zetia 10 a day.  4. Aspirin 81 a day.  5. Micardis 40 a day.  6. Bumetanide 1 mg daily.   PHYSICAL EXAM:  She is a pleasant, well-appearing woman in no acute  distress.  Blood pressure today was 109/68, the pulse 70 and regular,  the respirations were 18, the weight was 153 pounds.  NECK:  No jugular venous distention.  LUNGS:  Clear bilaterally to auscultation.  No wheezes, rales or rhonchi  were present.  CARDIOVASCULAR EXAM:  A regular rate and rhythm with normal S1 and S2.  EXTREMITIES:  Trace peripheral edema bilaterally.   Interrogation of her defibrillator demonstrates a Medtronic EnTrust with  P and R waves of 1.4 and 16 respectively, the impedance 560 in the  atrium, 648 in the ventricle.  Threshold 0.5 at 0.4 in both the atrium  and the ventricle.  The battery voltage was 3.17 volts.   IMPRESSION:  1. Nonischemic cardiomyopathy.  2.  Supraventricular tachycardia.  3. Status post implantable cardioverter-defibrillator insertion.  4. Class II heart failure.   DISCUSSION:  Overall, Ms. Gut is stable.  Her defibrillator is  working normally.  Her incision has healed nicely.  Her device is  reprogrammed.  Will plan to see her back in our [facility] followup  program and I will see her back in the office in April of 2009.     Doylene Canning. Ladona Ridgel, MD  Electronically Signed    GWT/MedQ  DD: 07/14/2007  DT: 07/14/2007  Job #: [id number]   cc:   Georga Hacking, M.D.

## 2011-04-23 NOTE — Procedures (Signed)
CAROTID DUPLEX EXAM   INDICATION:  Dizziness and syncopal episodes.   HISTORY:  Diabetes:  No.  Cardiac:  Yes.  Hypertension:  Yes.  Smoking:  No.  Previous Surgery:  No.  CV History:  No.  Amaurosis Fugax No, Paresthesias No, Hemiparesis No                                       RIGHT             LEFT  Brachial systolic pressure:         128               118  Brachial Doppler waveforms:         Biphasic          Biphasic  Vertebral direction of flow:        Antegrade         Antegrade  DUPLEX VELOCITIES (cm/sec)  CCA peak systolic                   94                91  ECA peak systolic                   123               123  ICA peak systolic                   81                71  ICA end diastolic                   27                27  PLAQUE MORPHOLOGY:                  Calcified         Calcified  PLAQUE AMOUNT:                      Mild              Mild  PLAQUE LOCATION:                    ICA               ICA and ECA   IMPRESSION:  1. 1%-39% stenosis noted in bilateral internal carotid arteries.  2. Antegrade bilateral vertebral arteries.         ___________________________________________  Larina Earthly, M.D.   MG/MEDQ  D:  02/16/2010  T:  02/16/2010  Job:  [id number]

## 2011-04-26 NOTE — Discharge Summary (Signed)
[name], SOLTERO                ACCOUNT NO.:  [id number]   MEDICAL RECORD NO.:  [id number]          PATIENT TYPE:  INP   LOCATION:  4731                         FACILITY:  [facility]   PHYSICIAN:  Darden Palmer., M.D.DATE OF BIRTH:  Apr 29, 1925   DATE OF ADMISSION:  09/07/2004  DATE OF DISCHARGE:  09/14/2004                                 DISCHARGE SUMMARY   FINAL DIAGNOSES:  1.  Syncope.      1.  Positive tilt table test.      2.  Implantation of a Medtronic pacemaker defibrillator.  2.  Dilated nonischemic cardiomyopathy with ejection fraction of 35 to 40%.  3.  Left bundle branch block.  4.  Dyslipidemia.   CONSULTATIONS:  Doylene Canning. Ladona Ridgel, M.D.   PROCEDURE:  Insertion of defibrillator pacemaker on September 13, 2004.  Tilt  table test.  Echocardiogram.   HISTORY OF PRESENT ILLNESS:  This 77 year old female has previously been in  good health except is diagnosed with dilated cardiomyopathy earlier this  year and has been on optimal medical therapy.  She has had some episodic  dizziness.  While in a surgical center accompanying her daughter who was to  have surgery, she had walked over to see her daughter prior to commencement  of surgery and had a syncopal episode.  She was noted to have a pulse rate  of 30 following this, regained consciousness after a short period of time  and was brought to the emergency room.  Please see the previously dictated  history and physical for remainder of the details.   HOSPITAL COURSE:  Laboratory data on admission showed a hemoglobin of 11.6,  hematocrit of  34.  Repeat hemoglobin was 11.4 with hematocrit of 31.9.  Sodium 140, potassium 3.6, chloride 105, glucose 122.  BUN 19, creatinine  1.3.  Serial CPK and MBs were normal.  Lipid panel shows a cholesterol of  131, HDL cholesterol of 50, LDL cholesterol of 51 and triglycerides 151.  TSH is 2.566, iron studies were normal.  B12 and folate levels were normal  for evaluation of anemia.   Urinalysis was unremarkable.   EKG shows a left bundle branch block.   MR angiogram shows vertebrobasilar ectasia with mild narrowing of the  nondominant distal left vertebral artery, otherwise no significant stenoses  were noted.   The patient had carotid Doppler studies done that showed mild plaque, no  significant internal carotid artery stenosis.  Echocardiogram  showed no  evidence of obstruction.  She had an ejection fraction of 35 to 40% and  dyskinetic pattern of interventricular septum.  A tilt table test done  September 16, 2004, was positive with a vasodepressor response noted at 28  minutes into 70 degree tilt with blood pressure going to 70/40 and heart  rate dropping into the 30s.  She was seen in consultation by Dr. Doylene Canning.  Ladona Ridgel, who felt that she had syncope and dilated cardiomyopathy and  recommended a pacemaker and defibrillator.  This was implanted by his  partner on September 13, 2004, with a Medtronic  Entrust D154 ATG,  serial  number [id number] implant.  The ICD lead was a Medtronic 6949, 65 cm lead,  serial number [id number].  The atrial lead was a St. Jude model number [id number],  serial number [id number].  The pacemaker was programmed for atrial predominance  rhythm and ventricular sensing with backup DDD pacemaking.  She was found  the next morning with pacemaker site to be intact.  She was discharged the  next day in stable condition.  The pacemaker and defibrillator were checked  the next morning and were fine.   She was discharged with enteric coated aspirin 325 mg daily.  Lipitor 20 mg  daily.  Coreg 25 mg b.i.d.  Altace 2.5 mg daily.  Protonix 40 mg daily.  Zoloft 25 mg daily.   It is recommended that she follow up for an appointment in two to three  weeks with me.  She is to see the [facility] for follow-up on October 03, 2004, and see Dr. Graciela Husbands in January.      [name]   WST/MEDQ  D:  09/14/2004  T:  09/14/2004  Job:  [id number]   cc:   Duke Salvia,  M.D.   Lucky Cowboy, M.D.  [address], [address]  [place], [place] [address]  Fax: [phone]

## 2011-04-26 NOTE — Procedures (Signed)
[place]  Patient:    [name], [name] Visit Number: [id number] MRN: [id number]          Service Type: END Location: ENDO Attending Physician:  Orland Mustard Dictated by:   Llana Aliment. Randa Evens, M.D. Proc. Date: 12/16/01 Admit Date:  12/16/2001   CC:         Marinus Maw, M.D.  Elvina Sidle, M.D.   Procedure Report  PROCEDURE:  Esophagogastroduodenoscopy.  MEDICATIONS:  Fentanyl 50 mcg, Versed 5 mg IV, Cetacaine spray.  INDICATION:  Heme-positive stool.  This woman has had a previous history of ulcer disease.  DESCRIPTION OF PROCEDURE:  The procedure had been explained to the patient and consent obtained.  With the patient in the left lateral decubitus position, the Olympus video endoscope was inserted blindly into the esophagus and advanced under direct visualization.  The stomach was entered, pylorus identified and passed.  The duodenum including the bulb and second portion was normal, had no ulceration or inflammation.  The scope was withdrawn back into the stomach.  The pyloric channel, antrum, and body were seen well.  The stomach was free of erosions, ulcerations, or any other abnormality, and the fundus and cardia were seen well on the retroflex view.  The scope was withdrawn.  The distal and proximal esophagus were endoscopically normal.  The patient tolerated the procedure well, was maintained on low-flow oxygen and pulse oximeter throughout the procedure.  ASSESSMENT:  Heme-positive stool with essentially normal endoscopy.  PLAN:  Will proceed with colonoscopy at this time. Dictated by:   Llana Aliment. Randa Evens, M.D. Attending Physician:  Orland Mustard DD:  12/16/01 TD:  12/16/01 Job: [id number]

## 2011-04-26 NOTE — Cardiovascular Report (Signed)
NAMEALBIE, [name]                          ACCOUNT NO.:  [id number]   MEDICAL RECORD NO.:  [id number]                   PATIENT TYPE:  OIB   LOCATION:  2899                                 FACILITY:  [facility]   PHYSICIAN:  Darden Palmer., M.D.         DATE OF BIRTH:  [birth year]   DATE OF PROCEDURE:  04/14/2003  DATE OF DISCHARGE:  04/14/2003                              CARDIAC CATHETERIZATION   PROCEDURE:  Cardiac catheterization.   CARDIOLOGIST:  Georga Hacking, M.D.   INDICATIONS:  Dyspnea, abnormal Cardiolite scan, left bundle branch block.   DESCRIPTION OF PROCEDURE:  Right and left heart catheterization with  coronary angiograms, left ventriculogram, measurements of cardiac output,  oxygen saturation and right heart pressures.   ACCESS:  The right femoral vein was accessed using an 8 French sheath, right  femoral artery using a 6 French sheath.   CATHETERS USED:  6 French coronary catheters and a 7 French Swan-Ganz  catheter.   COMMENTS:  The patient tolerated the procedure well without complications  and had good peripheral pulses at the end of the procedure.   HEMODYNAMIC DATA:  1. Right atrium pressure is 10, V equals 9, mean is 7.  2. Right ventricle 35/6-12.  3. Pulmonary artery 35/16, mean is 25%.  The percent saturation is 65%.  4. Pulmonary capillary wedge pressure A is 16, V equals 22.  5. Aorta post contrast 137/60, LV post contrast 137/10-16.  6. Cardiac output 3.8 liters per minute.  Cardiac index 2.2 liters per     minute.   ANGIOGRAPHIC DATA:  LEFT VENTRICULOGRAM:  Performed in the 30 degree RAO  projection.  The aortic valve appears normal.  The mitral valve has 2-3+  mitral regurgitation noted.  The left ventricle was mildly dilated with  moderate decrease in contractility diffusely.  The estimated ejection  fraction is 35-40%.   CORONARY ARTERIOGRAPHY:  Coronary arteries arise and distribute normally.   The left main coronary artery  has mild calcification and contains scattered  irregularities.   The left anterior descending extends to the apex and has scattered  irregularities.  No significant focal obstructive stenosis is noted.  A  large diagonal branch is free of disease.   Circumflex coronary artery:  A very large first marginal branch arises very  proximal in the vessel.  The continuation branch is somewhat small and has a  twin posterior lateral branch.   The right coronary artery is a dominant vessel and contains no significant  stenoses.   IMPRESSION:  1. Abnormal LV function with a dilated ventricle and moderate diffuse     hypokinesis compatible with cardiomyopathy.  2. Moderate mitral regurgitation.  3. Mild elevation of right heart pressures.  4. No significant coronary artery disease identified.   RECOMMENDATIONS:  Medical treatment at this time.  Darden Palmer., M.D.    WST/MEDQ  D:  04/14/2003  T:  04/15/2003  Job:  [id number]   cc:   Lucky Cowboy, M.D.  [address], [address]  [place], [place] [address]  Fax: [phone]

## 2011-04-26 NOTE — Op Note (Signed)
[name], [name] NO.:  [id number]   MEDICAL RECORD NO.:  [id number]          PATIENT TYPE:  INP   LOCATION:  4731                         FACILITY:  [facility]   PHYSICIAN:  Duke Salvia, M.D.  DATE OF BIRTH:  [birth year]   DATE OF PROCEDURE:  09/13/2004  DATE OF DISCHARGE:  09/14/2004                                 OPERATIVE REPORT   PREOPERATIVE DIAGNOSIS:  Cardiomyopathy.   POSTOPERATIVE DIAGNOSIS:  Cardiomyopathy.   PROCEDURE:  Dual-chamber defibrillator implantation, with intraoperative  defibrillation threshold testing.   Following obtaining informed consent, the patient was brought to the  electrophysiology laboratory and placed on the fluoroscopic table in the  supine position.  After routine prep and drape of the left upper chest,  lidocaine was infiltrated in the prepectoral subclavicular region.  An  incision was made and carried down to the layer of the prepectoral fascia  using electrocautery and sharp dissection.  A pocket was formed similarly.  Hemostasis was obtained.   Thereafter, attention was turned to gaining access to the extrathoracic left  subclavian vein which was accomplished without difficulty and without the  aspiration of air or puncture of the artery.  Two separate venipunctures  were accomplished.  Guide wires were placed and retained, and a 0 silk  suture was placed in a figure-of-eight fashion and allowed to hang loosely.   Subsequently, a 7 [place] tear-away introducer sheath was placed, through  which were passed initially a Medtronic 6949, 65 cm active fixation dual-  coil defibrillator lead, serial [id number].  This was manipulated into the  right ventricular apex, where the bipolar R wave was 15.8 millivolts, with a  pacing impedance of 980 ohms, and a threshold of 0.6 volts at 0.5 msec,  current threshold of 0.6 MA, and there was no diaphragmatic pacing at 10  volts.   Through the subsequent sheath, a St. Jude 1642,  52 cm passive fixation  atrial lead, serial no. [id number] was passed and manipulated into the  right atrial appendage, where the bipolar P wave was 2.7 millivolts, with a  pacing impedance of 419 ohms and a threshold of 0.5 volts at 0.5 msec.  Current threshold was 1.3 MA.  With these acceptable parameters recorded,  the leads were secured to the prepectoral fascia and then attached to a  Medtronic EnTrust D154ATG device, serial number [id number].  Through the  device, the bipolar P wave was 2 millivolts, with a pacing impedance of 432,  a threshold of 0.5 volts at 45 msec, with an R wave of 13 millivolts, with a  pacing impedance of 720 ohms, and a threshold of 0.5 volts at 0.5 msec.  The  proximal coil impedance was 54 ohms.  The distal coil impedance was 45 ohms.  With these acceptable parameters recorded, defibrillation threshold testing  was undertaken.  Ventricular fibrillation was induced via a T wave shock.  After a total duration of 5 seconds, a 15-joule shock was delivered through  a measured resistance of 42 ohms, terminating ventricular fibrillation and  restoring sinus rhythm.  After a wait of  five to six minutes, ventricular  fibrillation was reinduced via a T wave shock.  After a total duration of 6  seconds, a 15-joule shock was delivered through a measured resistance of 41  ohms, terminating ventricular fibrillation and restoring sinus rhythm.  With  these acceptable parameters recorded, the system was implanted.  The pocket  was copiously irrigated with antibiotic-containing saline solution.  Hemostasis was ensured, and the leads and the pulse generator were then  placed in the pocket, secured to the prepectoral fascia.  The wound was  closed in three layers in the normal fashion.  The wound was washed, dried,  and a Benzoin and Steri-Strip dressing was applied.  Needle counts, sponge  counts, and instrument counts were correct at the end of the procedure,  according to  the staff.  The patient tolerated the procedure without  apparent complication.   The patient's device was programmed as a three-zone device at 160/200/240,  with her device programmed in the AAI-DDD minimal ventricular pacing mode.       SCK/MEDQ  D:  10/25/2004  T:  10/25/2004  Job:  [id number]   cc:   Darden Palmer., M.D.  [address], [address]  [place]  [place] [address]  Fax: [phone]   Electrophysiology Laboratory   [facility]

## 2011-04-26 NOTE — Consult Note (Signed)
NAMEISELA, STANTZ                ACCOUNT NO.:  [id number]   MEDICAL RECORD NO.:  [id number]          PATIENT TYPE:  INP   LOCATION:  2039                         FACILITY:  [facility]   PHYSICIAN:  Casimiro Needle L. Reynolds, M.D.DATE OF BIRTH:  1948-05-04   DATE OF CONSULTATION:  10/22/2006  DATE OF DISCHARGE:  10/23/2006                                   CONSULTATION   REASON FOR EVALUATION:  Syncope.   HPI:  This is an inpatient consultation evaluation of this existing [facility]  patient, a 77 year old woman seen about 6 years by  Dr. Noreene Filbert for some spells of dizziness and lightheadedness, at which time  an EEG and MRI were unremarkable.  The patient is admitted for syncope.  She  tells me that she remembers being on a church bus and suddenly losing  consciousness and then waking up in the hospital.  According to the chart,  she recalls feeling a little bit hot prior to passing out and was noted at  that time to have a full thready pulse, and she also broke out into a  sweat at that time.  She was unconscious for about 10 minutes or so and  sleepy and confused after that for a little while, but now feels she is back  to her full normal self.  She has had other syncopal episodes in the past,  which she says were similar, with no associated focal weakness, numbness, or  paresthesias and no witnessed seizure activity.  In the hospital she has  been noted to be hypotensive and was a little bit lightheaded this afternoon  when she got up to walk around.  She had a workup including orthostatic  vital signs and carotid Doppler studies and a 2D echocardiogram, and her  blood pressure medicines have been reduced.  A Neurology consultation was  requested for further opinion.   PAST MEDICAL HISTORY:  Remarkable for a nonischemic dilated cardiomyopathy  with an EF of 35-40% status post pacemaker and AICD placement in 2005.  She  had a catheterization in May of 2004 showing  clean coronaries.   SOCIAL HISTORY:  Hypertension, gastroesophageal reflux disease,  hyperlipidemia, syncope with a positive tilt table test in October 2005.   FAMILY/SOCIAL/REVIEW OF SYSTEMS:  As outlined in the admission H&P by Dr.  Donnie Aho of September 20, 2006, as well as the accompanying medicine consult  done by Dr. Clelia Croft September 20, 2006, which I reviewed.   MEDICATIONS:  Prior to admission she was taking Coreg, Bumex, Micardis,  Prilosec, baby aspirin, and Pravachol.  In the hospital she continues to  receive Protonix, Avapro, and placed on Micardis, baby aspirin, and Coreg as  well as prophylactics of Lovenox.  Bumex was stopped today.   PHYSICAL EXAMINATION:  VITAL SIGNS:  Temperature 97, blood pressure 101/57,  pulse 84, respirations 22, O2 saturation 96% on room air.  GENERAL EXAMINATION:  This is a thin, but otherwise healthy-appearing female  in no distress.  HEENT:  Head:  Cranium is normocephalic and atraumatic.  Oropharynx benign.  NECK:  Supple, with a soft  right carotid bruit.  HEART:  Regular rate and rhythm without murmurs.  NEUROLOGIC EXAM:  Mental status:  She is awake, alert, and fully oriented to  time, place, and person.  Recent and remote memory are intact.  Attention  span, concentration and fund of knowledge are all appropriate.  Speech is  fluent, not dysarthric with no defects to confrontational naming, and she  can repeat a phrase.  Cranial nerves:  Pupils are equal and reactive.  Extraocular movements full without nystagmus.  Visual fields full to  confrontation.  Hearing intact to conversational speech.  The patient was  intact to pinprick.  Face, tongue, and palate move normally and  symmetrically.  Shoulder shrug strength is normal.  Motor testing:  Normal  bulk and tone.  Normal strength in all tested extremity muscles.  Sensation  intact to pinprick and light touch in all extremities.  Coordination:  Rapid  movements are performed well.   Finger-to-nose performed well.  Gait normal.  Reflexes 2+ and symmetric.  Toes are downgoing bilaterally.   LABORATORY REVIEW:  CBC:  White count 8.2, hemoglobin 12.2, platelets  239,000.  BMET remarkable for an elevated BUN and creatinine of 34 and 1.4  respectively, a minimally elevated glucose of 102.  TSH is normal.  Liver  enzymes are normal.  Note that her BUN and creatinine on admission were 38  and 1.7 respectively.  A 2D echocardiogram performed today demonstrates  mildly decreased systolic function, EF of 40%, no regional wall motion  abnormalities.  A carotid Doppler study was performed today, official  results pending.  Preliminary results show bilateral stenosis in the 40-60%  range.   IMPRESSION:  1. Syncope, vasovagal plus or minus hypovolemia.  2. Lightheadedness and dizziness, which she says she gets with certain      neck positions, especially extension.  It is possible this is related      to decreased cerebral blood flow.  3. Mild-to-moderate bilateral carotid stenosis, not clearly symptomatic at      this time.  She is not having true symptoms of transient ischemic      attacks.   RECOMMENDATIONS:  Would decrease her blood pressure medicines as much as  possible.  If her symptoms persist, consider cerebral angiogram, plus or  minus a vascular surgery evaluation.  Note that she does have renal  insufficiency and consideration of an angiogram would have to be made very  carefully.  She will need to have her carotid Doppler studies checked every  6 months to make sure her stenosis is not progressing.  I do not feel that  carotid intervention is indicated at the present time.  We will not proceed  with further workup at present.      Michael L. Thad Ranger, M.D.  Electronically Signed     MLR/MEDQ  D:  10/22/2006  T:  10/23/2006  Job:  [id number]

## 2011-04-26 NOTE — Op Note (Signed)
   NAME:  [name], [name]                          ACCOUNT NO.:  [id number]   MEDICAL RECORD NO.:  [id number]                   PATIENT TYPE:  AMB   LOCATION:  ENDO                                 FACILITY:  [facility]   PHYSICIAN:  James L. Malon Kindle., M.D.          DATE OF BIRTH:  Aug 29, 1925   DATE OF PROCEDURE:  01/31/2003  DATE OF DISCHARGE:  01/31/2003                                 OPERATIVE REPORT   PROCEDURE:  24-hour pH probe.   INDICATIONS:  A patient with persistent upper symptoms without any real  improvement with proton pump inhibitors.   DESCRIPTION OF PROCEDURE:  The procedure had been explained to the patient  and consent was obtained. The 24-hour pH probe was performed following  manometry. The probe was down for a total duration of 23 hours and 49  minutes. The results are as follows:   1. Upright time and reflux 11.7%., normal less than  6.3.  2. Recumbent time and reflux 0.3, normal less than 1.2.  3. Total time in reflux 7.3%, normal less than 4.2%.  4. Episodes over 5 minutes 4, normal less than 3.  5. Longest episode 10 minutes, normal less than  9.2.  6. Total episodes 170, normal less than  50.  7. Distal channel composite score 30, normal less than  22.   IMPRESSION:  This study does reveal some esophageal reflux but really not  very severe. It should respond fairly easily to simple proton pump inhibitor  therapy.   PLAN:  Will discuss these results with the patient. Recommend that she stay  on proton pump inhibitor. Will see back in the office in the next month or  so.                                               James L. Malon Kindle., M.D.    Waldron Session  D:  02/04/2003  T:  02/04/2003  Job:  [id number]   cc:   Lucky Cowboy, M.D.  [address]  [place], [place] [address]  Fax: [phone]

## 2011-04-26 NOTE — H&P (Signed)
NAMEMARQUISE, LAMBSON NO.:  [id number]   MEDICAL RECORD NO.:  [id number]          PATIENT TYPE:  INP   LOCATION:  1823                         FACILITY:  [facility]   PHYSICIAN:  Georga Hacking, M.D.DATE OF BIRTH:  1925-03-12   DATE OF ADMISSION:  10/21/2006  DATE OF DISCHARGE:                              HISTORY & PHYSICAL   REASON FOR ADMISSION:  Blacked out.   HISTORY:  Patient is a 77 year old female who is brought into the  hospital for evaluation of syncope.  The patient has a complex past  medical history.  She was evaluated initially with a dilated  cardiomyopathy in the spring of 2004.  At that time, she had an ejection  fraction of around 30% and had a catheterization done that showed no  significant coronary artery disease.  She was treated intensively with  medical therapy.  She did well until September 07, 2004 when she had a  syncopal episode occurring while waiting with her daughter at a health  surgical center.  She was noted to be somewhat hypotensive and  bradycardic during that admission.  She had a positive tilt table test  noted with reproduction of symptoms and was seen in consultation by Dr.  Lewayne Bunting, who did not feel that she had vasodepressor syncope but  rather it was an arrhythmic syncope and recommended implantation with an  implantable defibrillator.  She had this done and had a Medtronic  defibrillator implanted on September 13, 2004.  She has not been back in  the office since then.  She has had some episodic mild dizziness and has  also had dizziness when she looks upward.  She was seen at the [facility] emergency room on Sunday night with a history of dizziness that was  worse when she looked up or looked around.  She did not have any  definite syncope but was quite dizzy and had a negative CT scan.  She  was treated with meclizine and Ativan.  She was with her family today  and was riding in a bus on a church trip when she  became somewhat hot  and had a syncopal episode while seated.  She evidently broke out into a  sweat at that time and was unconscious for around 10 minutes or so.  She  has been somewhat sleepy ever since then.  She presented to the  emergency room here, where she had a normal blood pressure on admission.  Her BUN was 38 and creatinine was 1.7.  Initial point-of-care enzymes  was normal, and B-type natriuretic peptide was less than 30.  Hemoglobin  and hematocrit were normal.  She was still somewhat sleepy, and her  blood pressure has been only mildly orthostatic.   Her last echocardiogram showed paradoxical septal motion and an ejection  fraction of around 35-40%.  She has a previous history of past history  of dyslipidemia.  There is no history of hypertension or diabetes.   PAST SURGICAL HISTORY:  Cholecystectomy, hysterectomy, and appendectomy.   CURRENT MEDICATIONS:  1. Coreg 25 b.i.d.  2. Bumex  1 mg daily.  3. Micardis 40 mg daily.  4. Prilosec 20 mg daily.  5. Aspirin 81 mg daily.  6. Pravachol 40 mg daily.   SOCIAL HISTORY:  She lives at home with her husband.  She is a nonsmoker  and does not use alcohol to excess.   FAMILY HISTORY:  Father died at age 80 with bradycardia and a pacemaker.  Mother died at age 72 with hardening of the arteries.   REVIEW OF SYSTEMS:  She has had episodic dizzy spells.  She has had  significant heart failure previously but is now class I-II.  She has a  history of esophageal problems and has had progressive dysphagia to  solid food and with a history of esophageal stricture.  She is supposed  to have an upper endoscopy with Savary dilation at some point in the  future.  She has a moderate amount of arthritis.  Other than as noted  above, the remainder of the review of systems is unremarkable.   PHYSICAL EXAMINATION:  GENERAL:  She is an elderly woman who is somewhat  sleepy and lethargic.  VITAL SIGNS:  Blood pressure is currently 111/80.   Pulse was currently  70 and regular.  SKIN:  Warm and dry.  HEENT:  EOMI.  PERRLA.  CNS clear.  Fundi not remarkable.  Oropharynx  negative.  NECK:  Supple without masses, JVD, thyromegaly, or bruits.  LUNGS:  Clear to A&P.  CARDIAC:  Normal S1 and S2.  No S3, S4, or murmur.  ABDOMEN:  Soft and nontender.  No mass, hepatosplenomegaly, or aneurysm.  Femoral distal pulses are 2+.   Labs show a BUN of 38, creatinine 1.7.  TSH is pending.   IMPRESSION:  1. Syncopal episode:  By description sounds as if it could be      vasodepressor in origin.  She has a previous history of an abnormal      tilt table in the past.  2. Nonischemic cardiomyopathy.  3. History of implantable defibrillator placed for syncope in the      presence of a positive tilt table.  4. Hyperlipidemia:  Under treatment.  5. Recent history of dizziness, treated with meclizine and      benzodiazepines.   RECOMMENDATIONS:  The patient has had a syncopal episode that sounds as  if it could vasodepressor in origin.  We will review her medicines and  also check for orthostasis.  I will have her wear thigh-high support  stockings.  We will also obtain a neurologic consultation.  We will hold  the meclizine and the benzodiazepines at this time.      Georga Hacking, M.D.  Electronically Signed     WST/MEDQ  D:  10/21/2006  T:  10/21/2006  Job:  [id number]   cc:   Kari Baars, M.D.  Doylene Canning. Ladona Ridgel, MD

## 2011-04-26 NOTE — Assessment & Plan Note (Signed)
 HEALTHCARE                         GASTROENTEROLOGY OFFICE NOTE   NAME:[name], [name]                       MRN:          [id number]  DATE:02/17/2007                            DOB:          27-Jun-1925    [name] is here to set up screening colonoscopy.  She apparently has  a history of colon polyps and has not been examined for at least 5  years.  She also has a history of an esophageal stricture for which she  underwent dilatation in November 2007.  She has very mild dysphagia to  solids with eating hamburgers.  This is a symptom that is well-  controlled and has not been progressive.  She has no lower GI  complaints.  She does have an implantable ventricular defibrillator  because of her history of a nonischemic cardiomyopathy.   Medications include bumetanide, Lipitor, carvedilol, Micardis, baby  aspirin, Zetia, Prevacid, and Osteomatrix.  She has no allergies.   PHYSICAL EXAMINATION:  VITAL SIGNS:  Pulse 68, blood pressure 108/66,  weight 150.  HEENT:  EOMI. PERRLA. Sclerae are anicteric.  Conjunctivae are pink.  NECK:  Supple without thyromegaly, adenopathy or carotid bruits.  CHEST:  Clear to auscultation and percussion without adventitious  sounds.  CARDIAC:  Regular rhythm; normal S1 S2.  There are no murmurs, gallops  or rubs.  ABDOMEN:  Bowel sounds are normoactive.  Abdomen is soft, non-tender and  non-distended.  There are no abdominal masses, tenderness, splenic  enlargement or hepatomegaly.  EXTREMITIES:  Full range of motion.  No cyanosis, clubbing or edema.  RECTAL:  Deferred.   IMPRESSION:  1. History of colon polyps.  2. Cardiomyopathy - status post implantable ventricular defibrillator.  3. Esophageal stricture - minimally symptomatic.   RECOMMENDATION:  1. Colonoscopy.  Her defibrillator will be temporarily inactivated for      the procedure.  2. Repeat dilatation as needed.     Barbette Hair. Arlyce Dice, MD,FACG  Electronically Signed    RDK/MedQ  DD: 02/17/2007  DT: 02/17/2007  Job #: [id number]   cc:   Kari Baars, M.D.

## 2011-04-26 NOTE — Assessment & Plan Note (Signed)
[facility]                         ELECTROPHYSIOLOGY OFFICE NOTE   NAME:[name], [name]                       MRN:          [id number]  DATE:04/03/2007                            DOB:          1925/03/19    [name] is referred today by Dr. Viann Fish for concerns about  premature depletion of her ICD.  The patient is a very pleasant elderly  woman with a history of nonischemic cardiomyopathy and LV dysfunction  with left bundle branch block.  Her heart failure is class II.  She is  status post insertion of a Medtronic Maximo dual chamber ICD back in  October of 2005.  She has been found to have recently a decrease in her  battery voltage and has subsequently developed premature battery  depletion now with her device approaching ERI less than 2-1/2 years  after implant.  She is referred now for additional evaluation.  The  patient denies chest pain or shortness of breath, exertionally dyspnea,  one episode of shortness of breath for which she went to the emergency  room recently but no other symptoms were noted.  She has had no other  problems.   PHYSICAL EXAMINATION:  GENERAL APPEARANCE:  She is a pleasant, well-  appearing woman in no distress.  VITAL SIGNS:  Blood pressure 127/66, pulse 83 and regular, respirations  18, weight 150 pounds.  NECK:  No jugular venous distension.  LUNGS:  Clear to auscultation bilaterally.  No wheezing, rhonchi or  rales.  CARDIOVASCULAR:  Regular rate and rhythm with normal S1 and S2.  EXTREMITIES:  No clubbing, cyanosis, or edema.   The EKG demonstrates sinus rhythm with left bundle branch block.  Interrogation of the patient's ICD which was carried out previously  demonstrates a battery voltage of 2.62 volts.  The impedance was 536 in  the atrium, 624 in the ventricle.  There were no episodes of VT, VF or  atrial fibrillation.  Her pacing activity counters have been fairly  stable in the 2-3 hour range.   She was 94% A sensed to V sensed and 6% A  paced V sensed.   IMPRESSION:  1. Nonischemic cardiomyopathy.  2. Status post implantable cardioverter defibrillator insertion.   DISCUSSION:  The patient has suffered premature battery depletion on her  ICD and will be scheduled for ICD generator change in the next several  days. The risks, benefits, goals and expectations of this procedure have  been discussed with her and she would like to proceed.     Doylene Canning. Ladona Ridgel, MD  Electronically Signed    GWT/MedQ  DD: 04/03/2007  DT: 04/03/2007  Job #: [id number]

## 2011-04-26 NOTE — Cardiovascular Report (Signed)
[name], [name] NO.:  [id number]   MEDICAL RECORD NO.:  [id number]          PATIENT TYPE:  INP   LOCATION:  4731                         FACILITY:  [facility]   PHYSICIAN:  Elmore Guise., M.D.DATE OF BIRTH:  May 09, 1925   DATE OF PROCEDURE:  09/11/2004  DATE OF DISCHARGE:                              CARDIAC CATHETERIZATION   PROCEDURE:  Tilt table study with and without isoproterenol.   PREOPERATIVE DIAGNOSIS:  Syncope.   POSTOPERATIVE DIAGNOSIS:  Syncope with vasodepressor response.   CARDIOLOGIST PRESENT:  Rosine Abe, M.D.   MEDICATIONS ADMINISTERED DURING TEST:  0.5 mg Atropine.   BRIEF PROCEDURE DESCRIPTION:  The procedure and risks were explained, and  informed consent was obtained.  The patient was brought to the tilt table  lab in a fasting nonsedated state.  After the patient was placed on the tilt  table, blood pressure, ECG, and pulse oximetry monitoring, baseline  recordings were done for 5 minutes.  The patient was placed then in a 70-  degree head up tilt position and monitored.  At approximately 28 minutes of  70-degree head up tilt position, the patient with decrease in blood pressure  to 50/20, a brief increase in heart rate to 80-90 followed by a decrease in  her heart rate to 32 beats/minute.  The patient was then placed back in the  supine position and given IV fluid challenge as well as 0.5 mg of Atropine  with return of consciousness within 10 seconds.  Please see attached flow  sheet for vital sign description and details.   RESULTS:  Baseline tilt:  Baseline supine vital signs:  Blood pressure  147/60, heart rate 60, telemetry showing normal sinus rhythm with left  bundle branch block.  O2 saturations:  96% on room air.  Tilt at 70 degrees:  Initial vital signs at 5 minutes:  Blood pressure 154/55, pulse 61, O2  saturations 96%, telemetry showing sinus rhythm with left bundle branch  block pattern.  At 10 minutes, blood  pressure 129/61, heart rate 68, O2  saturations 96%, telemetry showing normal sinus rhythm with left bundle  branch pattern.  15 minutes; blood pressure 139/60, pulse 72, O2 saturations  96%, telemetry showing sinus rhythm.  At 20 minutes, blood pressure 132/62,  heart rate 74, O2 saturations 96%, telemetry showing normal sinus rhythm.  At 25 minutes into 70-degree tilt position, blood pressure 126/62, heart  rate 78, O2 saturations 97%, telemetry showing normal sinus rhythm.  Approximately 28 minutes into test the patient feeling warm.  Blood  pressure 135/64, heart rate 85, O2 saturations 97%, telemetry continued to  show normal sinus rhythm.  At 30 minutes into test, blood pressure decreased  to 107/50, heart rate 84.  The patient feeling warm and lightheaded.  O2  saturations still 97%.  Approximately 30 seconds later, blood pressure  decreased to 70/40, heart rate 62.  The patient then with positive loss of  consciousness, blood pressure decreased to 50/30, heart rate decreased to 32  beats/minute, sinus bradyrhythm.  The patient placed back in the supine  position, given  fluid challenge as well as 0.5 mg of Atropine.  Blood  pressure responded nicely to 142/71, heart rate came up to 54, telemetry  shows sinus bradycardia.  O2 saturations remained 100%.  The patient was  then monitored for 10 minutes in the supine position feeling back to her  baseline.  The patient was then transported from the cardiac catheterization  to recovery room prior to being sent back to the floor.  The patient took  approximately 2 minutes to recover after her positive test.   COMPLICATIONS:  None.   IMPRESSION:  Positive tilt study with vasodepressor followed by brief  cardioinhibitory response.   PLAN:  Will start SSRI.  Further care per primary team and cardiologist.       TWK/MEDQ  D:  09/11/2004  T:  09/11/2004  Job:  [id number]

## 2011-04-26 NOTE — Op Note (Signed)
   NAME:  [name], [name]                          ACCOUNT NO.:  [id number]   MEDICAL RECORD NO.:  [id number]                   PATIENT TYPE:  AMB   LOCATION:  ENDO                                 FACILITY:  [facility]   PHYSICIAN:  James L. Malon Kindle., M.D.          DATE OF BIRTH:  03-30-1925   DATE OF PROCEDURE:  02/04/2003  DATE OF DISCHARGE:  01/31/2003                                 OPERATIVE REPORT   PROCEDURE PERFORMED:  Esophageal manometry.   INDICATIONS FOR PROCEDURE:  The patient has had previous endoscopies but  continues to have a full feeling in her throat despite the use of PPIs.  None of these medicines have helped.  This is done in conjunction with a 24-  hour pH probe to evaluate for esophageal motility disorder explaining her  continued symptoms and for the degree of esophageal reflux.   DESCRIPTION OF PROCEDURE:  The procedure was performed in the [facility] manometry lab in the usual manner and no provocative studies were  performed.  Results are as follows.   1. Upper esophageal sphincter.  Positive pharyngeal spikes were present.  2. Esophageal body.  Peristalsis is normal with a mean pressure of 65 mm and     a mean duration of 2.5 seconds.  3. Lower esophageal sphincter.  Measured by the computer at 31 but my     measurement looks closer to 20 to 25.  This relaxed with swallowing.   IMPRESSION:  Normal manometry with possibly slightly low lower esophageal  sphincter pressure.   PLAN:  Will proceed at this time with 24-hour pH probe.                                               James L. Malon Kindle., M.D.    Waldron Session  D:  02/04/2003  T:  02/04/2003  Job:  [id number]   cc:   Lucky Cowboy, M.D.  [address]  [place], [place] [address]  Fax: [phone]

## 2011-04-26 NOTE — H&P (Signed)
[name], [name] NO.:  [id number]   MEDICAL RECORD NO.:  [id number]          PATIENT TYPE:  EMS   LOCATION:  MAJO                         FACILITY:  [facility]   PHYSICIAN:  [name], M.D.  DATE OF BIRTH:  01-19-1925   DATE OF ADMISSION:  09/07/2004  DATE OF DISCHARGE:                                HISTORY & PHYSICAL   PRIMARY MEDICAL DOCTOR:  Dr. Oneta [name].   CARDIOLOGIST:  Dr. Donnie [name].   CHIEF COMPLAINT:  Blackout.   HISTORY OF PRESENT ILLNESS:  As above, this is a 77 year old Caucasian  female with a known history of hypertension, dyslipidemia, and  cardiomyopathy. According the patient and her daughter, who also helped  supply the history, the patient's daughter was in the [facility] today having day surgery, the patient had accompanied her there. She  sat in the chair for a while and when she was called over to say hello to  her daughter before the time of surgery she felt quite faint and dizzy as  she got up out of the chair. She felt dizzy all the way to her daughter and  then went back to sit down feeling quite faint all the while. She denies  chest pain, palpitations, shortness of breath at this time.  She was led to  a chair and shortly after she gave a small moan and slumped. The medical  personnel in the area were summoned who ended up calling 9-1-1. No  involuntary movements were noted. The patient was incontinent of urine.  There was no tongue biting. She came to after several minutes according to  her daughter, who is with her in the emergency room. The patient has no  history of previous blackouts, although she has had episodic dizziness since  2001. Workup in the past with MRI/EEG was all negative according to the  patient.   PAST MEDICAL HISTORY:  1.  Episodic dizziness since 2001.  2.  CHF.  3.  Hypertension.  4.  Dyslipidemia.   MEDICATION HISTORY:  1.  Lipitor 20 mg p.o. alternating days.  2.  Coreg 25 mg  p.o. b.i.d.  3.  Altace 2.5 mg p.o. daily.  4.  Protonix 5 mg p.o. daily.  5.  Aspirin 81 mg p.o. daily.   ALLERGIES/SENSITIVITIES:  No known drug allergies.   SOCIAL HISTORY:  The patient's daughter lives with her. The patient is a  nonsmoker, nondrinker. No history of drug abuse.   FAMILY HISTORY:  She has a strong family history of heart disease,  hypertension, and CVA. As a matter of fact one of her sons died at age 31  years status post MI.   REVIEW OF SYSTEMS:  As stated in HPI and chief complaint, otherwise  negative.   PHYSICAL EXAMINATION:  VITAL SIGNS: Temperature 96.9, pulse 67 per minute  and regular, respiratory rate 20, BP 120/47 mmHg, pulse oximetry 100% on two  liters of oxygen.  GENERAL: The patient appears quite comfortable, communicative, not short of  breath, depressed.  HEENT: No frank pallor or jaundice. No conjunctival injection.  NECK: Supple. JVP is not seen. No palpable lymphadenopathy. No palpable  goiter. No carotid bruits.  HEART: S1 and S2 heard. Normal rhythm. No murmurs.  ABDOMEN: Full, soft, and nontender with no palpable organomegaly. No  palpable masses. Normal bowel sounds.  EXTREMITIES: Lower extremity examination with on pitting edema. Palpable  peripheral pulses.  MUSCULOSKELETAL: Full range of motion in all _________ tenderness noted.  CENTRAL NERVOUS SYSTEM: Alert and oriented times three. No focal neurologic  deficits on gross examination.   INVESTIGATION:  Hemoglobin 11.6, hematocrit 34.0. Electrolytes show sodium  of 140, potassium 3.6, chloride 105, CO2 30.9, BUN 20, creatinine 1.4,  glucose 122. Troponin-I less than 0.05.   EKG shows a sinus rhythm at 84 per minute and regular, left bundle branch  block pattern. Chest x-ray reveals borderline cardiomegaly, minimal soft  segmental atelectasis/infiltrate or scarring in the left lung base.   ASSESSMENT/PLAN:  1.  Syncopal episode. Differential diagnosis somewhat wide.  Considerations      include vasovagal versus cardiac, depression, or syncope. One also has      to consider the possibility of seizure, although this appears unlikely      given the history. It may also have been a transient ischemic attack,      although I doubt this because there is no focal neurologic deficit at      this time. Will admit however to the telemetry unit, rule out MI      protocol. However I have requested cardiology consultation. The patient      already  has a cardiologist,  Dr. Donnie [name]. I have called him on consult and apparently this was supplied by  Dr. Donnie [name] and [name]. Will also have complete workup with MRI, carotid  duplex, and 2-D echocardiogram. Meanwhile, have increased aspirin dosage to  325 mg p.o. daily.  1.  History of hypertension. This appears to controlled. Will continue      current dosage of ramipril/Coreg.  2.  Cardiomyopathy. The patient is not clinically in congestive heart      failure at this time. Will continue Coreg and ACE inhibitor.  3.  Dyslipidemia. Will check lipid profile/TSH. Continue statin treatment      for now. The patient will be placed on PPI for gastroesophageal reflux      disease. Will also have deep venous thrombosis prophylaxis. She may      eventually need EPS consultation/tilt table testing. Further management      will depend on clinical course.       CO/MEDQ  D:  09/07/2004  T:  09/07/2004  Job:  [id number]   cc:   [name], M.D.  [address], [address]  [place], [place] [address]  Fax: [phone]   [name]., M.D.  [address], [address]  [place]  [place] [address]  Fax: [phone]

## 2011-04-26 NOTE — Procedures (Signed)
[facility]  Patient:    [name], [name] Visit Number: [id number] MRN: [id number]          Service Type: END Location: ENDO Attending Physician:  Orland Mustard Dictated by:   Llana Aliment. Randa Evens, M.D. Proc. Date: 12/16/01 Admit Date:  12/16/2001   CC:         Marinus Maw, M.D.  Elvina Sidle, M.D.   Procedure Report  PROCEDURE PERFORMED:  Colonoscopy.  ENDOSCOPIST:  Llana Aliment. Randa Evens, M.D.  MEDICATIONS USED:  Fentanyl 75 mcg, Versed 7.5 mg IV.  INSTRUMENT:  Pediatric Olympus video colonoscope .  INDICATIONS:  Heme positive stool.  Endoscopy done just prior to this was negative.  This is done to evaluate for a cause of heme positive stools.  DESCRIPTION OF PROCEDURE:  The procedure had been explained to the patient and consent obtained.  With the patient in the left lateral decubitus position, the Olympus video colonoscope was inserted and advanced under direct visualization.  The prep was excellent and we were able to advance to the cecum without difficulty.  The cecum, ascending colon, hepatic flexure, transverse colon, splenic flexure, descending colon were all seen well and were unremarkable.  There was moderate diverticular disease in the sigmoid colon.  No polyps or other lesions were seen throughout the entire colon.  The scope withdrawn.  There were moderate internal hemorrhoids in the rectum.  The patient tolerated the procedure well.  Maintained on low flow oxygen and pulse oximeter throughout both procedures with no obvious problem.  ASSESSMENT: 1. Heme positive stool.  Essentially normal colonoscopy. 2. Moderate diverticulosis. 3. Internal hemorrhoids possibly source of heme positive stool.  PLAN:  Will see back in the office in two months to recheck the stool and give information about diverticulosis. Dictated by:   Llana Aliment. Randa Evens, M.D. Attending Physician:  Orland Mustard DD:  12/16/01 TD:   12/16/01 Job: [id number]

## 2011-04-26 NOTE — Consult Note (Signed)
[name], [name] NO.:  [id number]   MEDICAL RECORD NO.:  [id number]          PATIENT TYPE:  INP   LOCATION:  2039                         FACILITY:  [facility]   PHYSICIAN:  Kari Baars, M.D.  DATE OF BIRTH:  19-Apr-1925   DATE OF CONSULTATION:  10/21/2006  DATE OF DISCHARGE:                                 CONSULTATION   REQUESTING PHYSICIAN:  Dr. Viann Fish.   CHIEF COMPLAINT:  Passed out.   HISTORY OF PRESENT ILLNESS:  [name] is a 77 year old white female  new patient of mine with a history of nonischemic dilated cardiomyopathy  (ejection fraction 35-40%), history of vasovagal syncope status post  pacemaker and AICD placement (October 2005), who presented to the  emergency department today with an episode of passing out.  The patient  had a syncopal episode in October 2005.  At that time she underwent  evaluation with tilt table, which was positive.  She also had an MRA  angiogram with vertebrobasilar ectasia, mild narrowing of the  nondominant distal left vertebral artery, and carotid studies that  showed mild plaque with no significant internal carotid artery stenosis.  At that time, a pacemaker and AICD was placed due to her underlying  cardiomyopathy.  She tolerated this well and has had no recurrence until  this morning.  She states that she felt quite dizzy on Sunday with an  unsteady gait with walking.  She presented to the emergency department  at [facility] at that time and was diagnosed with vertigo.  They  discharged her to home on meclizine and Ativan, which she has been  taking.  Today will while riding in a church vain to get a fruit cake,  she had a syncopal episode with loss of consciousness for about 10  minutes.  This was witnessed by her husband, and she had no evidence of  shaking, incontinence.  She was not responsive throughout the episode,  and her husband states that she had a thready pulse.  She presented to  the  emergency department, where her blood pressure was 96/43 with an  orthostatic drop in blood pressure with standing.  Her BUN and  creatinine were also noted to be elevated.  The patient denies any  recent change in her p.o. intake.  She has been taking her medications  as prescribed.  Of note, she did recently change from lisinopril to  Micardis due to throat irritation.  She specifically denies chest pain,  shortness of breath, palpitations, motor or sensory deficits, or  headache.   REVIEW OF SYSTEMS:  All systems were reviewed with the patient and are  negative except as in the HPI, the following exceptions:  Recent  dysphagia symptoms resulting in an esophageal dilation about a week ago.  Hip pain, saw Dr. Simonne Come yesterday and was prescribed Celebrex.   PAST MEDICAL HISTORY:  1. Congestive heart failure, nonischemic dilated cardiomyopathy      (ejection fraction 35-40%).  Cardiac catheterization in May 2004      showed clean coronaries with moderate mitral regurgitation.  2. Status post pacemaker/AICD (  October 2005).  3. History of syncope with a positive tilt table (October 2005).  4. Hypertension.  5. GERD/dysphagia secondary to recurrent peptic stricture, dilated      last Wednesday.  6. Hyperlipidemia.  7. Chronic left bundle branch block.  8. Status post cholecystectomy.  9. Status post appendectomy.  10.Status post partial hysterectomy.   MEDICATIONS:  1. Coreg 2.5 mg b.i.d.  2. Pravachol 240 mg daily.  3. Bumex 1 mg daily.  4. Aspirin 81 mg daily.  5. Nexium 40 mg daily.  6. Micardis 40 mg daily.  7. Celebrex 200 mg daily.  8. Meclizine p.r.n.  9. Ativan p.r.n.   ALLERGIES:  No known drug allergies.  Lisinopril causes throat  irritation.   SOCIAL HISTORY:  She is a widow since 46.  She has 6 children and is  retired from [employer].  No tobacco, alcohol or drug use.   FAMILY HISTORY:  Her father had a pacemaker.  Mother died at 70 of  dementia.  Brother  had an MRI at the age of 80.  She has a son who had  an MI at 45 and a daughter with stroke.   PHYSICAL EXAMINATION:  VITAL SIGNS:  Temperature 97.3, blood pressure  99/71 initially, 111/45 currently.  Pulse 79, respirations 20, oxygen  saturation 99% on room air.  Orthostatic blood pressures shows supine  blood pressure 96/43 with a pulse of 71, sitting 113/49 with pulse rate  of 69, and standing 84/42 with pulse rate of 92.  GENERAL:  She is resting comfortably in no acute distress.  She is a  little drowsy.  HEENT:  She has sunken orbits bilaterally.  NECK:  Supple without lymphadenopathy, JVD or carotid bruits.  HEART:  Regular rate and rhythm without murmurs, rubs or gallops.  LUNGS:  Clear to auscultation bilaterally.  ABDOMEN:  Soft, nondistended, nontender, with normoactive bowel sounds.  EXTREMITIES:  No clubbing, cyanosis or edema.  NEUROLOGIC:  Alert and oriented x4.  Cranial nerves II-XII are intact.  Motor strength is 5/5 in all extremities.  Normal sensation.  No focal  neurologic deficits.   LABORATORY DATA:  Hemoglobin 13.9.  BMET significant for sodium 140,  potassium 4.1, chloride 94, bicarb 27, BUN 38, creatinine 1.7, glucose  94.  Troponin less than 0.05.  BNP is less than 30.   STUDIES:  1. Head CT done on November 11 shows no acute intracranial      abnormalities.  2. EKG shows left old bundle branch block.  The pacemaker was      interrogated and showed no evidence of tachyarrhythmias.   ASSESSMENT/PLAN:  1. Syncope secondary to orthostasis, hypotension, vasovagal syncope.      The patient has been seen by cardiology and being admitted to their      service.  I greatly appreciate their management.  The hypotension      and orthostatic changes are the most likely cause of her syncope      with probable mild volume depletion contributing.  This is      supported by increased BUN and creatinine on Bumex.  The recent     medication adjustments with the change  to Micardis may also be      contributing with resultant decreased blood pressure.  Dosage      changes will be per cardiology.  I do not feel that any further      neurologic evaluation is really necessary, as she does not appear  to have had a primary neurologic event.  Further workup, if any,      per Dr. Donnie Aho.  2. Acute renal failure.  Will monitor her renal function with gentle      hydration and consider decrease in Bumex      and Micardis.  Obtain BMET in the morning.  3. Disposition.  Per Dr. Donnie Aho.  I will follow the patient's course      with you, and I greatly appreciate cardiology management.      Kari Baars, M.D.  Electronically Signed     WS/MEDQ  D:  10/21/2006  T:  10/22/2006  Job:  [id number]   cc:   Georga Hacking, M.D.

## 2011-04-26 NOTE — Discharge Summary (Signed)
NAMECHARVI, [name]                ACCOUNT NO.:  [id number]   MEDICAL RECORD NO.:  [id number]          PATIENT TYPE:  INP   LOCATION:  4731                         FACILITY:  [facility]   PHYSICIAN:  Colleen Can. Deborah Chalk, M.D.DATE OF BIRTH:  1925/09/21   DATE OF ADMISSION:  DATE OF DISCHARGE:                                 DISCHARGE SUMMARY   HISTORY:  [name] is a 77 year old female who was admitted for evaluation  of syncope.  She has history of nonischemic cardiomyopathy and a chronic  left bundle branch block followed by Dr. Donnie Aho.   She was with her daughter at [facility] today, waiting to see her daughter  in a preop situation, became somewhat tense and could not relax.  She got up  from sitting, felt weak and dizzy. She was assisted to the chair, and then  had a loss of consciousness for approximately 30 seconds. There was no  seizure activity, no tongue biting. She did have loss of bladder function  but no loss of bowel function.   The patient was brought to the emergency depression for further evaluation.  She feels well except for indigestion.  The report from the emergency room  says the patient turned blue and had heart rates into the 30s.  Within 2  minutes of the event, she was conscious and yet somnolent.   PAST MEDICAL HISTORY:  She has nonischemic cardiomyopathy with cath in May  2004 showing an ejection fraction of 35%. There was no coronary disease.  Hypertension. Dyslipidemia.  Childbirth x6.  Left bundle branch block.   PAST SURGICAL HISTORY:  Hysterectomy, cholecystectomy.   ALLERGIES:  NONE.   CURRENT MEDICATIONS:  Lipitor, Coreg, Altace and aspirin.   FAMILY HISTORY:  Father died at age 46, had permanent transvenous pacemaker.  Mother died at 63 of hardening of her arteries.   SOCIAL HISTORY:  She lives with her daughter, no tobacco, no alcohol.   REVIEW OF SYSTEMS:  Periodic dizziness with no history of prior syncope.  She does have occasional  constipation. No chest pain, no shortness of  breath, no palpitations, no edema.   PHYSICAL EXAMINATION:  VITAL SIGNS:  Blood pressure 120/47, heart rate in  the 60s.  GENERAL:  She is in no acute distress. She is very pleasant.  SKIN:  Warm and dry.  Color is normal.  LUNGS:  Clear.  CARDIOVASCULAR:  Regular rate and rhythm.  ABDOMEN:  Soft, nontender.  EXTREMITIES: Without edema.  NEUROLOGIC:  Intact.   PERTINENT LABORATORY DATA:  Potassium 3.6, BUN 20, creatinine 1.4.  Hematocrit 34.  Chest x-ray showed borderline cardiomegaly.  Cardiac markers  were negative. EKG showed left bundle branch block with normal PR interval.   OVERALL IMPRESSION:  1.  Syncope.  2.  Nonischemic cardiomyopathy.  3.  Left bundle branch block.  4.  Hypertension.   PLAN:  Agree with a 2-D echocardiogram, carotid Dopplers and MRI scan. She  needs to be monitored on telemetry.  It may be that tilt table testing will  be helpful in the future.  She will be monitored  on telemetry until that  time.       SNT/MEDQ  D:  09/09/2004  T:  09/09/2004  Job:  [id number]   cc:   Lucky Cowboy, M.D.  [address], [address]  [place], [place] [address]  Fax: [phone]   W. Ashley Royalty., M.D.  [address], [address]  [place]  [place] [address]  Fax: [phone]

## 2011-04-26 NOTE — Assessment & Plan Note (Signed)
[facility]                           GASTROENTEROLOGY OFFICE NOTE   NAME:[name], Danielle MCGROARTY                       MRN:          [id number]  DATE:10/14/2006                            DOB:          [birth year]    PROBLEM:  Dysphagia.  [name] has returned complaining of dysphagia.  She  has experienced progressive dysphagia to solids.  She has a history of  esophageal stricture.  Pyrosis is actually fairly well controlled with  Acufex 20 mg a day.   Other medications include carvedilol, Micardis, Zegerid at bedtime, and  folate.   EXAMINATION:  VITAL SIGNS:  Pulse 68.  Blood pressure 121/52.  Weight 181.   IMPRESSION:  Recurrent peptic esophageal stricture.   RECOMMENDATIONS:  1. Continue current medications.  2. Upper endoscopy with Savary dilatation.     Barbette Hair. Arlyce Dice, MD,FACG  Electronically Signed    RDK/MedQ  DD: 10/14/2006  DT: 10/14/2006  Job #: [id number]   cc:   Kari Baars

## 2011-04-26 NOTE — Consult Note (Signed)
NAMECORTINA, VULTAGGIO                ACCOUNT NO.:  [id number]   MEDICAL RECORD NO.:  [id number]          PATIENT TYPE:  INP   LOCATION:  4731                         FACILITY:  [facility]   PHYSICIAN:  Doylene Canning. Ladona Ridgel, M.D.  DATE OF BIRTH:  1925-09-15   DATE OF CONSULTATION:  09/12/2004  DATE OF DISCHARGE:                                   CONSULTATION   REASON FOR CONSULTATION:  Evaluation of unexplained syncope in a patient  with a dilated cardiomyopathy, left bundle branch block, and a positive head  up tilt table test.   HISTORY OF PRESENT ILLNESS:  The patient is a 77 year old woman who has a  history of dilated cardiomyopathy with an ejection fraction of 35-45%  diagnosed by Dr. Georga Hacking approximately two years ago.  She was in  her usual state of health when she was in the short-stay area with her  daughter who was about to undergo surgery.  While standing, she suddenly  felt like she was going to pass out and subsequently did within a few  seconds of the onset of her symptoms.   The patient was awake and felt well.  It is unclear how long she was  unresponsive.  She states that things just got dark.  Apparently, it was  approximately 30 seconds.  There was no seizure activity but the patient did  have loss of bladder incontinence.  She was brought to the emergency room  where she was said to have pulse rate in the 30s but this was not  documented.   PAST MEDICAL HISTORY:  1.  Dyslipidemia status post cholecystectomy.  2.  Status post hysterectomy.  3.  Status post appendectomy.   MEDICATIONS:  Lipitor, Coreg, Altace, and aspirin.   FAMILY HISTORY:  Notable for father dying at age 53 who had bradycardia and  a pacemaker.  Mother died at age 80 with hardening of the arteries.   SOCIAL HISTORY:  The patient lives with her daughter.  She denies tobacco or  ethanol use.   REVIEW OF SYMPTOMS:  Notable for occasional dizzy spells but no frank  syncope up until this  admission.  She denies chest pain, cough, hemoptysis.  When she was initially diagnosed with congestive heart failure, she had  class II to III symptoms but on maximum medical therapy she appears to be  class I.  Her additional review of systems was negative.   PHYSICAL EXAMINATION:  GENERAL:  She was a pleasant, well-appearing 78-year-  old woman in no distress.  VITAL SIGNS:  Blood pressure was 120/50, pulse 60 and regular, respirations  16.  HEENT:  Normocephalic and atraumatic.  Pupils were equal and round.  The  oropharynx was moist.  Sclerae were anicteric.  NECK:  No jugular venous distention.  There was no thyromegaly.  The trachea  was midline.  LUNGS:  Clear bilaterally to auscultation.  There were no wheezes, rales, or  rhonchi.  CARDIOVASCULAR:  Regular rate and rhythm with normal S1 and S2.  I did not  appreciate any gallops.  ABDOMEN:  Soft, nontender, and  nondistended.  There was no organomegaly.  EXTREMITIES:  No clubbing, cyanosis, or edema.  PULSES:  Pulses were 2+ and symmetric.  NEUROLOGICAL:  Alert and oriented x 3 with cranial nerves II through XII  grossly intact.  The strength was 5/5 and symmetric.   IMPRESSION:  1.  Unexplained syncope.  2.  Dilated cardiomyopathy with ejection fraction of 35-45%.  3.  Left bundle branch block.  4.  Positive tilt table test.   RECOMMENDATIONS:  The patient's history and age do not suggest neurally  mediated syncope.  She may well have had syncope secondary to a transient  arteriovenous block (Stokes-Adams) but I am also concerned about ventricular  tachycardia as the cause of her syncope.  Because syncope, particularly that  with sudden onset and dilated cardiomyopathy, are a class II indication for  implantable cardioverter defibrillator implantation and I have recommended a  dual-chamber defibrillator rather than dual-chamber pacemaker.  I have  discussed these issues with the patient and her son and they wish to  proceed  with this.  I will attempt to schedule this at the earliest possible  convenient time.       GWT/MEDQ  D:  09/12/2004  T:  09/12/2004  Job:  [id number]   cc:   Lucky Cowboy, M.D.  [address], [address]  [place], [place] [address]  Fax: [phone]

## 2011-04-26 NOTE — Discharge Summary (Signed)
[name], [name] NO.:  [id number]   MEDICAL RECORD NO.:  [id number]          PATIENT TYPE:  INP   LOCATION:  2039                         FACILITY:  [facility]   PHYSICIAN:  Georga Hacking, M.D.DATE OF BIRTH:  January 15, 1925   DATE OF ADMISSION:  10/21/2006  DATE OF DISCHARGE:                               DISCHARGE SUMMARY   FINAL DIAGNOSES:  1. Syncope thought due to vasodepressor syncope.  2. Cardiomyopathy with previous placement of a permanent pacemaker and      atrial implantable cardioverter-defibrillator.  3. Mild carotid artery disease.  4. Hyperlipidemia under treatment.  5. Previous history of hypertension.  6. History of reflux and dysphagia due to recurrent peptic stricture.  7. Hyperlipidemia.  8. Chronic left bundle branch block.   CONSULTATIONS:  1. Dr. Buren Kos.  2. Dr. Kelli Hope.   PROCEDURE:  Echocardiogram.   HISTORY:  This 77 year old female has a history of a nonischemic dilated  cardiomyopathy and a history of vasal syncope with a positive tilt test.  She also has had an AICD placement and permanent pacemaker implant.  She  had dizziness on Sunday with an unsteady gait with walking and presented  to the emergency room at [facility] where she was diagnosed  with vertigo.  She was treated with benzodiazepines as well as  meclizine.  Since then she has been somewhat lethargic.  While riding in  a church [name], to get a fruit cake, she had a syncope episode with loss  of consciousness while seated.  She was unconscious for about 10 minutes  and then she was nonresponsive and had a thready pulse.  She presented  to the ED where her blood pressure was 96/43 with orthostasis, BUN and  creatinine were mildly elevated.  She has recently changed from  lisinopril to Micardis because of throat irritation and also has been  taking fluid pills.  She was admitted for further evaluation.  Please  see the previously dictated  History and Physical for remainder of the  details.   HOSPITAL COURSE:  Lab data on admission shows normal cardiac enzymes.  Chemistry panel showed a BUN of 34, creatinine of 1.5, normal liver  test.  A CBC was normal.  A PT and PTT were normal.  B-natriuretic  peptide was less than 30.   She was admitted and her meclizine and benzodiazepines were held.  She  was noted to be somewhat orthostatic and it was thought that her history  was suggestive of vasodepressor syncope.  She was taken off of Celebrex  and she was placed on support stockings and her bumetanide was withheld.  Following this, she felt better although she continued to be mildly  orthostatic.  At this point in time we are going to adjust her blood  pressure medicines and heart failure medicines downward.  Her ejection  fraction is now about 45% and she has LVH with a history of a  dyssynergic pattern of intraventricular septum.  She had her  defibrillator interrogated and showed no evidence of implantable  defibrillator discharges.  She did have  an episode of supraventricular  tachycardia occurring on October 13, 2006.  She had really no  significant pace-making, no atrial fibrillation.  She was given  instructions to sleep with the head of her bed elevated, to wear over-  the-thigh venous support stockings and she is to drink two full glasses  of water first thing in the morning.  She was also instructed in  maneuvers to terminate syncope.  She is discharged at the time on  carvedilol 12.5 mg b.i.d., Micardis 40 mg daily, Prilosec 20 mg daily,  aspirin 81 mg daily, and pravastatin 40 mg daily.  She is to follow with  me in 1 month.  She was seen in consultation while she was in the  hospital by Dr. Kelli Hope who thought that she had mild carotid  artery disease and that if her symptoms increased then check cerebral  dopplers every 6 months and also to reduce her blood pressure medicines  as much as possible.  She  was much stable on discharge and is discharged  at this time in improved condition.      Georga Hacking, M.D.  Electronically Signed     WST/MEDQ  D:  10/23/2006  T:  10/23/2006  Job:  [id number]   cc:   Kari Baars, M.D.  Michael L. Thad Ranger, M.D.

## 2011-04-29 NOTE — Discharge Summary (Signed)
  [name], Danielle                ACCOUNT NO.:  [id number]  MEDICAL RECORD NO.:  [id number]           PATIENT TYPE:  I  LOCATION:  3707                         FACILITY:  [facility]  PHYSICIAN:  Zannie Cove, MD     DATE OF BIRTH:  [birth year]  DATE OF ADMISSION:  04/07/2011 DATE OF DISCHARGE:  04/08/2011                              DISCHARGE SUMMARY   CARDIOLOGIST:  Dr. Viann Fish  ELECTROPHYSIOLOGIST:  Dr. Lewayne Bunting  DISCHARGE DIAGNOSES: 1. Syncope secondary to vasodepressor syncope compounded by mild     dehydration. 2. Nonischemic cardiomyopathy with an EF of 40%. 3. History of prophylactic AICD. 4. Dyslipidemia. 5. History of hypertension. 6. Chronic left bundle branch block. 7. Gastroesophageal reflux disease. 8. History of colon polyps. 9. UTI  DISCHARGE MEDICATIONS:  Are as follows. 1. Ciprofloxacin 250 mg p.o. b.i.d. for 3 days. 2. Gabapentin 100 mg p.o. bedtime. 3. Aspirin over-the-counter 1 tablet daily. 4. Bumetanide half tablet on Monday, Wednesday, and Saturday. 5. Carvedilol 25 mg tab 1/2 tablet p.o. b.i.d. 6. Echinacea over-the-counter 1 tablet daily. 7. Estrace vaginal cream 0.1 mg per gram one application 3 times     weekly as needed. 8. Losartan 50 mg p.o. daily. 9. Lyrica 75 mg p.o. at bedtime. 10.Magnesium 250 mg p.o. t.i.d. 11.Pravachol 40 mg daily. 12.Ranitidine 300 mg p.o. b.i.d. 13.Vitamin B complex 100 mg p.o. b.i.d. 14.Vitamin D3 1000 units 2 tablets daily. 15.Zetia 10 mg daily.  DIAGNOSTICS/INVESTIGATIONS:  Chest x-ray of April 07, 2011, moderate cardiomegaly, no active lung disease.  HOSPITAL COURSE:  [name] is a very pleasant 77 year old female with history of nonischemic cardiomyopathy was at a church and then started feeling dizzy, diaphoretic, flushed and subsequently had a syncopal event brought to the hospital for evaluation.  She did have mild orthostatic positive orthostatics on admission, which subsequently improved  with fluid administration.  She did not have any acute changes on EKG and was ruled out for MI based on cardiac markers.  She also had a pacemaker evaluation, which did not show any arrhythmias, and tele did not show any abnormalities as well. Her UA had increased WBCs, Urine cultures pending at this point, hence empirically being treated with Cipro for 3 days.  Rest of her cardiac problems remained stable and is being discharged home today to follow up with Dr. Viann Fish in 7-10 days.     Zannie Cove, MD     PJ/MEDQ  D:  04/20/2011  T:  04/20/2011  Job:  [id number]  cc:   Georga Hacking, M.D.  Electronically Signed by Zannie Cove  on 04/29/2011 07:53:33 PM

## 2011-05-21 ENCOUNTER — Telehealth: Payer: Self-pay | Admitting: Internal Medicine

## 2011-05-21 NOTE — Telephone Encounter (Signed)
Pt signed ROI,Mailed all Records to Daughter Leim Fabry (POA)  05/21/11/km

## 2011-05-31 ENCOUNTER — Telehealth: Payer: Self-pay | Admitting: Internal Medicine

## 2011-05-31 NOTE — Telephone Encounter (Signed)
Dr Ladona Ridgel aware of her medications she is taking  Okay for her to take Lyrica

## 2011-05-31 NOTE — Telephone Encounter (Signed)
Has questions re medication

## 2011-09-23 ENCOUNTER — Other Ambulatory Visit: Payer: Self-pay | Admitting: Obstetrics and Gynecology

## 2011-09-23 DIAGNOSIS — N6459 Other signs and symptoms in breast: Secondary | ICD-10-CM

## 2011-10-09 ENCOUNTER — Other Ambulatory Visit: Payer: Self-pay | Admitting: Radiology

## 2011-12-19 DIAGNOSIS — I951 Orthostatic hypotension: Secondary | ICD-10-CM | POA: Diagnosis not present

## 2011-12-19 DIAGNOSIS — Z95810 Presence of automatic (implantable) cardiac defibrillator: Secondary | ICD-10-CM | POA: Diagnosis not present

## 2011-12-19 DIAGNOSIS — I6529 Occlusion and stenosis of unspecified carotid artery: Secondary | ICD-10-CM | POA: Diagnosis not present

## 2011-12-19 DIAGNOSIS — I428 Other cardiomyopathies: Secondary | ICD-10-CM | POA: Diagnosis not present

## 2011-12-19 DIAGNOSIS — I509 Heart failure, unspecified: Secondary | ICD-10-CM | POA: Diagnosis not present

## 2011-12-19 DIAGNOSIS — E785 Hyperlipidemia, unspecified: Secondary | ICD-10-CM | POA: Diagnosis not present

## 2011-12-19 DIAGNOSIS — I10 Essential (primary) hypertension: Secondary | ICD-10-CM | POA: Diagnosis not present

## 2012-01-14 ENCOUNTER — Other Ambulatory Visit (HOSPITAL_COMMUNITY): Payer: Self-pay | Admitting: Internal Medicine

## 2012-01-14 ENCOUNTER — Ambulatory Visit (HOSPITAL_COMMUNITY)
Admission: RE | Admit: 2012-01-14 | Discharge: 2012-01-14 | Disposition: A | Discharge status: Home / Self Care | Payer: Medicare Other | Source: Ambulatory Visit | Attending: Internal Medicine | Admitting: Internal Medicine

## 2012-01-14 DIAGNOSIS — M47817 Spondylosis without myelopathy or radiculopathy, lumbosacral region: Secondary | ICD-10-CM | POA: Diagnosis not present

## 2012-01-14 DIAGNOSIS — N390 Urinary tract infection, site not specified: Secondary | ICD-10-CM | POA: Diagnosis not present

## 2012-01-14 DIAGNOSIS — E559 Vitamin D deficiency, unspecified: Secondary | ICD-10-CM | POA: Diagnosis not present

## 2012-01-14 DIAGNOSIS — M545 Low back pain, unspecified: Secondary | ICD-10-CM | POA: Insufficient documentation

## 2012-01-14 DIAGNOSIS — R209 Unspecified disturbances of skin sensation: Secondary | ICD-10-CM | POA: Diagnosis not present

## 2012-01-14 DIAGNOSIS — E782 Mixed hyperlipidemia: Secondary | ICD-10-CM | POA: Diagnosis not present

## 2012-01-14 DIAGNOSIS — I10 Essential (primary) hypertension: Secondary | ICD-10-CM | POA: Diagnosis not present

## 2012-01-14 DIAGNOSIS — R7309 Other abnormal glucose: Secondary | ICD-10-CM | POA: Diagnosis not present

## 2012-02-26 DIAGNOSIS — H26499 Other secondary cataract, unspecified eye: Secondary | ICD-10-CM | POA: Diagnosis not present

## 2012-02-28 DIAGNOSIS — I10 Essential (primary) hypertension: Secondary | ICD-10-CM | POA: Diagnosis not present

## 2012-02-28 DIAGNOSIS — I509 Heart failure, unspecified: Secondary | ICD-10-CM | POA: Diagnosis not present

## 2012-02-28 DIAGNOSIS — I951 Orthostatic hypotension: Secondary | ICD-10-CM | POA: Diagnosis not present

## 2012-02-28 DIAGNOSIS — Z95810 Presence of automatic (implantable) cardiac defibrillator: Secondary | ICD-10-CM | POA: Diagnosis not present

## 2012-02-28 DIAGNOSIS — I6529 Occlusion and stenosis of unspecified carotid artery: Secondary | ICD-10-CM | POA: Diagnosis not present

## 2012-02-28 DIAGNOSIS — I428 Other cardiomyopathies: Secondary | ICD-10-CM | POA: Diagnosis not present

## 2012-02-28 DIAGNOSIS — E785 Hyperlipidemia, unspecified: Secondary | ICD-10-CM | POA: Diagnosis not present

## 2012-03-19 DIAGNOSIS — I509 Heart failure, unspecified: Secondary | ICD-10-CM | POA: Diagnosis not present

## 2012-03-19 DIAGNOSIS — E785 Hyperlipidemia, unspecified: Secondary | ICD-10-CM | POA: Diagnosis not present

## 2012-03-19 DIAGNOSIS — I6529 Occlusion and stenosis of unspecified carotid artery: Secondary | ICD-10-CM | POA: Diagnosis not present

## 2012-03-19 DIAGNOSIS — I428 Other cardiomyopathies: Secondary | ICD-10-CM | POA: Diagnosis not present

## 2012-03-19 DIAGNOSIS — I10 Essential (primary) hypertension: Secondary | ICD-10-CM | POA: Diagnosis not present

## 2012-03-19 DIAGNOSIS — Z95810 Presence of automatic (implantable) cardiac defibrillator: Secondary | ICD-10-CM | POA: Diagnosis not present

## 2012-03-19 DIAGNOSIS — I951 Orthostatic hypotension: Secondary | ICD-10-CM | POA: Diagnosis not present

## 2012-03-31 DIAGNOSIS — Z01419 Encounter for gynecological examination (general) (routine) without abnormal findings: Secondary | ICD-10-CM | POA: Diagnosis not present

## 2012-04-07 DIAGNOSIS — E559 Vitamin D deficiency, unspecified: Secondary | ICD-10-CM | POA: Diagnosis not present

## 2012-04-07 DIAGNOSIS — Z79899 Other long term (current) drug therapy: Secondary | ICD-10-CM | POA: Diagnosis not present

## 2012-04-07 DIAGNOSIS — E782 Mixed hyperlipidemia: Secondary | ICD-10-CM | POA: Diagnosis not present

## 2012-04-07 DIAGNOSIS — R7309 Other abnormal glucose: Secondary | ICD-10-CM | POA: Diagnosis not present

## 2012-04-07 DIAGNOSIS — I10 Essential (primary) hypertension: Secondary | ICD-10-CM | POA: Diagnosis not present

## 2012-04-14 ENCOUNTER — Other Ambulatory Visit: Payer: Self-pay | Admitting: Cardiology

## 2012-04-14 DIAGNOSIS — D249 Benign neoplasm of unspecified breast: Secondary | ICD-10-CM | POA: Diagnosis not present

## 2012-04-14 DIAGNOSIS — Z09 Encounter for follow-up examination after completed treatment for conditions other than malignant neoplasm: Secondary | ICD-10-CM | POA: Diagnosis not present

## 2012-04-21 ENCOUNTER — Encounter: Payer: Medicare Other | Admitting: Internal Medicine

## 2012-04-22 ENCOUNTER — Ambulatory Visit (INDEPENDENT_AMBULATORY_CARE_PROVIDER_SITE_OTHER): Payer: Medicare Other | Admitting: Internal Medicine

## 2012-04-22 ENCOUNTER — Encounter: Payer: Self-pay | Admitting: Internal Medicine

## 2012-04-22 VITALS — BP 120/64 | HR 71 | Ht 63.0 in | Wt 152.0 lb

## 2012-04-22 DIAGNOSIS — I5022 Chronic systolic (congestive) heart failure: Secondary | ICD-10-CM | POA: Diagnosis not present

## 2012-04-22 DIAGNOSIS — I509 Heart failure, unspecified: Secondary | ICD-10-CM

## 2012-04-22 DIAGNOSIS — Z95810 Presence of automatic (implantable) cardiac defibrillator: Secondary | ICD-10-CM | POA: Diagnosis not present

## 2012-04-22 LAB — ICD DEVICE OBSERVATION
AL AMPLITUDE: 1.4 mv
ATRIAL PACING ICD: 9.6 pct
BAMS-0001: 200 {beats}/min
BATTERY VOLTAGE: 2.69 V
DEV-0020ICD: NEGATIVE
RV LEAD AMPLITUDE: 19.7 mv
TZAT-0001ATACH: 2
TZAT-0001SLOWVT: 1
TZAT-0001SLOWVT: 2
TZAT-0002ATACH: NEGATIVE
TZAT-0002ATACH: NEGATIVE
TZAT-0004FASTVT: 8
TZAT-0005SLOWVT: 88 pct
TZAT-0005SLOWVT: 91 pct
TZAT-0011SLOWVT: 10 ms
TZAT-0011SLOWVT: 10 ms
TZAT-0012ATACH: 150 ms
TZAT-0012FASTVT: 200 ms
TZAT-0018ATACH: NEGATIVE
TZAT-0018ATACH: NEGATIVE
TZAT-0018SLOWVT: NEGATIVE
TZAT-0018SLOWVT: NEGATIVE
TZAT-0019ATACH: 6 V
TZAT-0019ATACH: 6 V
TZAT-0019ATACH: 6 V
TZAT-0019FASTVT: 8 V
TZAT-0019SLOWVT: 8 V
TZAT-0020ATACH: 1.5 ms
TZAT-0020FASTVT: 1.5 ms
TZON-0003VSLOWVT: 400 ms
TZON-0004VSLOWVT: 20
TZON-0005SLOWVT: 12
TZST-0001ATACH: 5
TZST-0001FASTVT: 2
TZST-0001FASTVT: 4
TZST-0001FASTVT: 6
TZST-0001SLOWVT: 3
TZST-0002ATACH: NEGATIVE
TZST-0003FASTVT: 35 J
TZST-0003FASTVT: 35 J
TZST-0003FASTVT: 35 J
TZST-0003SLOWVT: 20 J
TZST-0003SLOWVT: 35 J

## 2012-04-22 NOTE — Patient Instructions (Addendum)
Your physician wants you to follow-up in: 1 year with Dr. Taylor. You will receive a reminder letter in the mail two months in advance. If you don't receive a letter, please call our office to schedule the follow-up appointment.  Your physician recommends that you continue on your current medications as directed. Please refer to the Current Medication list given to you today.  

## 2012-04-22 NOTE — Progress Notes (Signed)
HPI [name] returns today for followup. She is a very pleasant now 78 year old woman with a nonischemic cardiomyopathy, class II congestive heart failure, status post ICD implantation. She has left bundle branch block and a QRS duration of 155 ms. The patient has had no syncope. She thinks that her heart failure symptoms have worsened, though she has not been hospitalized. She denies syncope, or chest pain. She remains in her garden but notes that she has to rest often and take frequent breaks. No Known Allergies   Current Outpatient Prescriptions  Medication Sig Dispense Refill  . aspirin 81 MG tablet Take 81 mg by mouth daily.        Marland Kitchen b complex vitamins capsule Take 1 capsule by mouth 2 (two) times daily.       . bumetanide (BUMEX) 0.5 MG tablet 0.5 mg. Take 1/2 tablet Monday, Thursday, and Saturday      . carvedilol (COREG) 25 MG tablet 25 mg. Take 1/4 tablet twice daily      . Cholecalciferol (VITAMIN D3) 1000 UNITS CAPS Take 2,000 Units by mouth 2 (two) times daily.       Marland Kitchen ezetimibe (ZETIA) 10 MG tablet Take 10 mg by mouth daily.        Marland Kitchen losartan (COZAAR) 50 MG tablet Take 50 mg by mouth. Take 1/2 daily      . Magnesium 250 MG TABS Take 1 tablet by mouth 3 (three) times daily.        . pravastatin (PRAVACHOL) 40 MG tablet Take 40 mg by mouth daily. Take 1/2 daily       . pregabalin (LYRICA) 75 MG capsule Take 75 mg by mouth daily.        . ranitidine (ZANTAC) 300 MG capsule Take 300 mg by mouth 2 (two) times daily.           Past Medical History  Diagnosis Date  . Secondary cardiomyopathy, unspecified   . Occlusion and stenosis of carotid artery without mention of cerebral infarction   . Syncope and collapse   . Hypertension   . GERD (gastroesophageal reflux disease)   . Hyperlipemia     ROS:   All systems reviewed and negative except as noted in the HPI.   Past Surgical History  Procedure Date  . Insert / replace / remove pacemaker     cardiac defibrillator situ  09/2004  . Cardiac catheterization     04/2003     No family history on file.   History   Social History  . Marital Status: Widowed    Spouse Name: N/A    Number of Children: N/A  . Years of Education: N/A   Occupational History  . Not on file.   Social History Main Topics  . Smoking status: Never Smoker   . Smokeless tobacco: Never Used  . Alcohol Use: Not on file  . Drug Use: Not on file  . Sexually Active: Not on file   Other Topics Concern  . Not on file   Social History Narrative  . No narrative on file     BP 120/64  Pulse 71  Ht 5\' 3"  (1.6 m)  Wt 152 lb (68.947 kg)  BMI 26.93 kg/m2  Physical Exam:  Well appearing elderly woman, NAD HEENT: Unremarkable Neck:  No JVD, no thyromegally Lungs:  Clear with no wheezes, rales, or rhonchi. HEART:  Regular rate rhythm, no murmurs, no rubs, no clicks Abd:  soft, positive bowel sounds, no organomegally, no  rebound, no guarding Ext:  2 plus pulses, no edema, no cyanosis, no clubbing Skin:  No rashes no nodules Neuro:  CN II through XII intact, motor grossly intact  EKG Normal sinus rhythm with left bundle branch block. DEVICE  Normal device function.  See PaceArt for details.   Assess/Plan:

## 2012-04-22 NOTE — Assessment & Plan Note (Signed)
Her symptoms are currently class II. She will continue her current medical therapy. 

## 2012-04-22 NOTE — Assessment & Plan Note (Signed)
Her device is working normally. Her 6949-lead is stable. She has not received any ICD therapies. She is approaching but not yet at elective replacement. When this occurs, I would recommend considering discontinuing her ICD and placing a biventricular pacemaker. I will discuss this with her primary cardiologist, Dr. Donnie Aho.

## 2012-04-27 ENCOUNTER — Other Ambulatory Visit: Payer: Self-pay | Admitting: Cardiology

## 2012-04-27 DIAGNOSIS — I10 Essential (primary) hypertension: Secondary | ICD-10-CM | POA: Diagnosis not present

## 2012-04-27 DIAGNOSIS — E782 Mixed hyperlipidemia: Secondary | ICD-10-CM | POA: Diagnosis not present

## 2012-04-28 ENCOUNTER — Other Ambulatory Visit (HOSPITAL_COMMUNITY): Payer: Self-pay | Admitting: Internal Medicine

## 2012-04-28 DIAGNOSIS — R198 Other specified symptoms and signs involving the digestive system and abdomen: Secondary | ICD-10-CM

## 2012-04-28 DIAGNOSIS — R1310 Dysphagia, unspecified: Secondary | ICD-10-CM

## 2012-04-30 ENCOUNTER — Other Ambulatory Visit (HOSPITAL_COMMUNITY): Payer: Self-pay | Admitting: Internal Medicine

## 2012-04-30 ENCOUNTER — Ambulatory Visit (HOSPITAL_COMMUNITY)
Admission: RE | Admit: 2012-04-30 | Discharge: 2012-04-30 | Disposition: A | Discharge status: Home / Self Care | Payer: Medicare Other | Source: Ambulatory Visit | Attending: Internal Medicine | Admitting: Internal Medicine

## 2012-04-30 DIAGNOSIS — K219 Gastro-esophageal reflux disease without esophagitis: Secondary | ICD-10-CM | POA: Diagnosis not present

## 2012-04-30 DIAGNOSIS — K449 Diaphragmatic hernia without obstruction or gangrene: Secondary | ICD-10-CM | POA: Diagnosis not present

## 2012-04-30 DIAGNOSIS — K225 Diverticulum of esophagus, acquired: Secondary | ICD-10-CM | POA: Insufficient documentation

## 2012-04-30 DIAGNOSIS — R1310 Dysphagia, unspecified: Secondary | ICD-10-CM | POA: Diagnosis not present

## 2012-04-30 DIAGNOSIS — R198 Other specified symptoms and signs involving the digestive system and abdomen: Secondary | ICD-10-CM | POA: Diagnosis not present

## 2012-04-30 DIAGNOSIS — R1900 Intra-abdominal and pelvic swelling, mass and lump, unspecified site: Secondary | ICD-10-CM | POA: Diagnosis not present

## 2012-05-19 DIAGNOSIS — K219 Gastro-esophageal reflux disease without esophagitis: Secondary | ICD-10-CM | POA: Diagnosis not present

## 2012-05-19 DIAGNOSIS — R1310 Dysphagia, unspecified: Secondary | ICD-10-CM | POA: Diagnosis not present

## 2012-06-18 DIAGNOSIS — I509 Heart failure, unspecified: Secondary | ICD-10-CM | POA: Diagnosis not present

## 2012-06-18 DIAGNOSIS — I951 Orthostatic hypotension: Secondary | ICD-10-CM | POA: Diagnosis not present

## 2012-06-18 DIAGNOSIS — Z95810 Presence of automatic (implantable) cardiac defibrillator: Secondary | ICD-10-CM | POA: Diagnosis not present

## 2012-06-18 DIAGNOSIS — E785 Hyperlipidemia, unspecified: Secondary | ICD-10-CM | POA: Diagnosis not present

## 2012-06-18 DIAGNOSIS — I6529 Occlusion and stenosis of unspecified carotid artery: Secondary | ICD-10-CM | POA: Diagnosis not present

## 2012-06-18 DIAGNOSIS — I10 Essential (primary) hypertension: Secondary | ICD-10-CM | POA: Diagnosis not present

## 2012-06-18 DIAGNOSIS — I428 Other cardiomyopathies: Secondary | ICD-10-CM | POA: Diagnosis not present

## 2012-07-31 DIAGNOSIS — L919 Hypertrophic disorder of the skin, unspecified: Secondary | ICD-10-CM | POA: Diagnosis not present

## 2012-07-31 DIAGNOSIS — I10 Essential (primary) hypertension: Secondary | ICD-10-CM | POA: Diagnosis not present

## 2012-07-31 DIAGNOSIS — L909 Atrophic disorder of skin, unspecified: Secondary | ICD-10-CM | POA: Diagnosis not present

## 2012-08-25 DIAGNOSIS — L821 Other seborrheic keratosis: Secondary | ICD-10-CM | POA: Diagnosis not present

## 2012-08-25 DIAGNOSIS — D1801 Hemangioma of skin and subcutaneous tissue: Secondary | ICD-10-CM | POA: Diagnosis not present

## 2012-08-25 DIAGNOSIS — L820 Inflamed seborrheic keratosis: Secondary | ICD-10-CM | POA: Diagnosis not present

## 2012-08-31 DIAGNOSIS — E785 Hyperlipidemia, unspecified: Secondary | ICD-10-CM | POA: Diagnosis not present

## 2012-08-31 DIAGNOSIS — I10 Essential (primary) hypertension: Secondary | ICD-10-CM | POA: Diagnosis not present

## 2012-08-31 DIAGNOSIS — Z95810 Presence of automatic (implantable) cardiac defibrillator: Secondary | ICD-10-CM | POA: Diagnosis not present

## 2012-08-31 DIAGNOSIS — I428 Other cardiomyopathies: Secondary | ICD-10-CM | POA: Diagnosis not present

## 2012-08-31 DIAGNOSIS — I951 Orthostatic hypotension: Secondary | ICD-10-CM | POA: Diagnosis not present

## 2012-08-31 DIAGNOSIS — I6529 Occlusion and stenosis of unspecified carotid artery: Secondary | ICD-10-CM | POA: Diagnosis not present

## 2012-08-31 DIAGNOSIS — I509 Heart failure, unspecified: Secondary | ICD-10-CM | POA: Diagnosis not present

## 2012-10-08 DIAGNOSIS — Z79899 Other long term (current) drug therapy: Secondary | ICD-10-CM | POA: Diagnosis not present

## 2012-10-08 DIAGNOSIS — E559 Vitamin D deficiency, unspecified: Secondary | ICD-10-CM | POA: Diagnosis not present

## 2012-10-08 DIAGNOSIS — I10 Essential (primary) hypertension: Secondary | ICD-10-CM | POA: Diagnosis not present

## 2012-10-08 DIAGNOSIS — R7309 Other abnormal glucose: Secondary | ICD-10-CM | POA: Diagnosis not present

## 2012-10-08 DIAGNOSIS — Z1212 Encounter for screening for malignant neoplasm of rectum: Secondary | ICD-10-CM | POA: Diagnosis not present

## 2012-10-08 DIAGNOSIS — Z23 Encounter for immunization: Secondary | ICD-10-CM | POA: Diagnosis not present

## 2012-10-08 DIAGNOSIS — E782 Mixed hyperlipidemia: Secondary | ICD-10-CM | POA: Diagnosis not present

## 2012-10-27 DIAGNOSIS — R7309 Other abnormal glucose: Secondary | ICD-10-CM | POA: Diagnosis not present

## 2012-11-19 DIAGNOSIS — I428 Other cardiomyopathies: Secondary | ICD-10-CM | POA: Diagnosis not present

## 2012-11-19 DIAGNOSIS — I6529 Occlusion and stenosis of unspecified carotid artery: Secondary | ICD-10-CM | POA: Diagnosis not present

## 2012-11-19 DIAGNOSIS — I10 Essential (primary) hypertension: Secondary | ICD-10-CM | POA: Diagnosis not present

## 2012-11-19 DIAGNOSIS — I951 Orthostatic hypotension: Secondary | ICD-10-CM | POA: Diagnosis not present

## 2012-11-19 DIAGNOSIS — I509 Heart failure, unspecified: Secondary | ICD-10-CM | POA: Diagnosis not present

## 2012-11-19 DIAGNOSIS — Z95810 Presence of automatic (implantable) cardiac defibrillator: Secondary | ICD-10-CM | POA: Diagnosis not present

## 2012-11-19 DIAGNOSIS — E785 Hyperlipidemia, unspecified: Secondary | ICD-10-CM | POA: Diagnosis not present

## 2012-12-18 DIAGNOSIS — Z79899 Other long term (current) drug therapy: Secondary | ICD-10-CM | POA: Diagnosis not present

## 2012-12-18 DIAGNOSIS — R7309 Other abnormal glucose: Secondary | ICD-10-CM | POA: Diagnosis not present

## 2012-12-18 DIAGNOSIS — I10 Essential (primary) hypertension: Secondary | ICD-10-CM | POA: Diagnosis not present

## 2012-12-18 DIAGNOSIS — E782 Mixed hyperlipidemia: Secondary | ICD-10-CM | POA: Diagnosis not present

## 2012-12-18 DIAGNOSIS — E559 Vitamin D deficiency, unspecified: Secondary | ICD-10-CM | POA: Diagnosis not present

## 2013-02-18 DIAGNOSIS — I951 Orthostatic hypotension: Secondary | ICD-10-CM | POA: Diagnosis not present

## 2013-02-18 DIAGNOSIS — I509 Heart failure, unspecified: Secondary | ICD-10-CM | POA: Diagnosis not present

## 2013-02-18 DIAGNOSIS — E785 Hyperlipidemia, unspecified: Secondary | ICD-10-CM | POA: Diagnosis not present

## 2013-02-18 DIAGNOSIS — I428 Other cardiomyopathies: Secondary | ICD-10-CM | POA: Diagnosis not present

## 2013-02-18 DIAGNOSIS — Z95810 Presence of automatic (implantable) cardiac defibrillator: Secondary | ICD-10-CM | POA: Diagnosis not present

## 2013-02-18 DIAGNOSIS — I10 Essential (primary) hypertension: Secondary | ICD-10-CM | POA: Diagnosis not present

## 2013-02-18 DIAGNOSIS — I6529 Occlusion and stenosis of unspecified carotid artery: Secondary | ICD-10-CM | POA: Diagnosis not present

## 2013-02-22 DIAGNOSIS — Z95810 Presence of automatic (implantable) cardiac defibrillator: Secondary | ICD-10-CM | POA: Diagnosis not present

## 2013-02-22 DIAGNOSIS — I6529 Occlusion and stenosis of unspecified carotid artery: Secondary | ICD-10-CM | POA: Diagnosis not present

## 2013-02-22 DIAGNOSIS — E785 Hyperlipidemia, unspecified: Secondary | ICD-10-CM | POA: Diagnosis not present

## 2013-02-22 DIAGNOSIS — I10 Essential (primary) hypertension: Secondary | ICD-10-CM | POA: Diagnosis not present

## 2013-02-22 DIAGNOSIS — I951 Orthostatic hypotension: Secondary | ICD-10-CM | POA: Diagnosis not present

## 2013-02-22 DIAGNOSIS — I428 Other cardiomyopathies: Secondary | ICD-10-CM | POA: Diagnosis not present

## 2013-02-22 DIAGNOSIS — I509 Heart failure, unspecified: Secondary | ICD-10-CM | POA: Diagnosis not present

## 2013-03-23 DIAGNOSIS — I10 Essential (primary) hypertension: Secondary | ICD-10-CM | POA: Diagnosis not present

## 2013-03-23 DIAGNOSIS — E559 Vitamin D deficiency, unspecified: Secondary | ICD-10-CM | POA: Diagnosis not present

## 2013-03-23 DIAGNOSIS — E782 Mixed hyperlipidemia: Secondary | ICD-10-CM | POA: Diagnosis not present

## 2013-03-23 DIAGNOSIS — R7309 Other abnormal glucose: Secondary | ICD-10-CM | POA: Diagnosis not present

## 2013-03-23 DIAGNOSIS — Z79899 Other long term (current) drug therapy: Secondary | ICD-10-CM | POA: Diagnosis not present

## 2013-04-05 DIAGNOSIS — Z124 Encounter for screening for malignant neoplasm of cervix: Secondary | ICD-10-CM | POA: Diagnosis not present

## 2013-04-16 DIAGNOSIS — Z1231 Encounter for screening mammogram for malignant neoplasm of breast: Secondary | ICD-10-CM | POA: Diagnosis not present

## 2013-04-20 DIAGNOSIS — I10 Essential (primary) hypertension: Secondary | ICD-10-CM | POA: Diagnosis not present

## 2013-04-30 ENCOUNTER — Encounter (HOSPITAL_COMMUNITY): Payer: Self-pay | Admitting: Pharmacy Technician

## 2013-04-30 ENCOUNTER — Encounter: Payer: Self-pay | Admitting: Internal Medicine

## 2013-04-30 ENCOUNTER — Ambulatory Visit (INDEPENDENT_AMBULATORY_CARE_PROVIDER_SITE_OTHER): Payer: Medicare Other | Admitting: Internal Medicine

## 2013-04-30 ENCOUNTER — Other Ambulatory Visit: Payer: Self-pay | Admitting: *Deleted

## 2013-04-30 ENCOUNTER — Encounter: Payer: Self-pay | Admitting: *Deleted

## 2013-04-30 VITALS — BP 142/68 | HR 72 | Ht 63.0 in | Wt 148.6 lb

## 2013-04-30 DIAGNOSIS — I5022 Chronic systolic (congestive) heart failure: Secondary | ICD-10-CM

## 2013-04-30 DIAGNOSIS — Z95810 Presence of automatic (implantable) cardiac defibrillator: Secondary | ICD-10-CM | POA: Diagnosis not present

## 2013-04-30 DIAGNOSIS — I428 Other cardiomyopathies: Secondary | ICD-10-CM | POA: Diagnosis not present

## 2013-04-30 LAB — ICD DEVICE OBSERVATION
AL AMPLITUDE: 1.3 mv
AL IMPEDENCE ICD: 528 Ohm
ATRIAL PACING ICD: 7.52 pct
DEV-0020ICD: NEGATIVE
RV LEAD AMPLITUDE: 19.7 mv
RV LEAD IMPEDENCE ICD: 488 Ohm
TOT-0001: 2
TOT-0006: 20080428000000
TZAT-0001ATACH: 2
TZAT-0001ATACH: 3
TZAT-0001SLOWVT: 1
TZAT-0001SLOWVT: 2
TZAT-0002ATACH: NEGATIVE
TZAT-0002ATACH: NEGATIVE
TZAT-0005SLOWVT: 88 pct
TZAT-0005SLOWVT: 91 pct
TZAT-0018ATACH: NEGATIVE
TZAT-0018SLOWVT: NEGATIVE
TZAT-0018SLOWVT: NEGATIVE
TZAT-0019ATACH: 6 V
TZAT-0019ATACH: 6 V
TZAT-0019ATACH: 6 V
TZAT-0019FASTVT: 8 V
TZAT-0019SLOWVT: 8 V
TZAT-0019SLOWVT: 8 V
TZAT-0020FASTVT: 1.5 ms
TZON-0004VSLOWVT: 36
TZON-0005SLOWVT: 12
TZST-0001ATACH: 5
TZST-0001ATACH: 6
TZST-0001FASTVT: 2
TZST-0001FASTVT: 4
TZST-0001FASTVT: 6
TZST-0001SLOWVT: 3
TZST-0001SLOWVT: 5
TZST-0002ATACH: NEGATIVE
TZST-0003FASTVT: 35 J
TZST-0003FASTVT: 35 J
TZST-0003FASTVT: 35 J
TZST-0003FASTVT: 35 J
TZST-0003SLOWVT: 35 J
VF: 0

## 2013-04-30 LAB — CBC WITH DIFFERENTIAL/PLATELET
Basophils Relative: 0.4 % (ref 0.0–3.0)
Eosinophils Relative: 3 % (ref 0.0–5.0)
Hemoglobin: 13.8 g/dL (ref 12.0–15.0)
Lymphocytes Relative: 26.6 % (ref 12.0–46.0)
MCHC: 34.2 g/dL (ref 30.0–36.0)
Monocytes Relative: 6.6 % (ref 3.0–12.0)
Neutro Abs: 5.5 10*3/uL (ref 1.4–7.7)
Neutrophils Relative %: 63.4 % (ref 43.0–77.0)
RBC: 4.48 Mil/uL (ref 3.87–5.11)
WBC: 8.7 10*3/uL (ref 4.5–10.5)

## 2013-04-30 LAB — BASIC METABOLIC PANEL
CO2: 28 mEq/L (ref 19–32)
Calcium: 9.8 mg/dL (ref 8.4–10.5)
GFR: 40.95 mL/min — ABNORMAL LOW (ref >60.00)
Sodium: 142 mEq/L (ref 135–145)

## 2013-04-30 NOTE — Patient Instructions (Signed)
Will upgrade device to Bi-V Pacemaker   See instruction sheet

## 2013-04-30 NOTE — Assessment & Plan Note (Signed)
Her CHF symptoms are worsened and I have recommended she take her diuretic daily. Will plan BiV PPM upgrade.

## 2013-04-30 NOTE — Progress Notes (Signed)
HPI [name] returns today for followup. She is a very pleasant 79 yo woman with a h/o a non-ischemic CM, chronic systolic CHF, class 2, and LBBB, s/p ICD implant. She notes increasing dyspnea over the past month. She denies dietary indiscretion. No syncope or ICD shock. She has had mild peripheral edema. She denies medical non-compliance.  No Known Allergies   Current Outpatient Prescriptions  Medication Sig Dispense Refill  . aspirin 81 MG tablet Take 81 mg by mouth daily.        Marland Kitchen b complex vitamins capsule Take 1 capsule by mouth 2 (two) times daily.       . bumetanide (BUMEX) 0.5 MG tablet 0.5 mg. Take 1/2 tablet Monday, Thursday, and Saturday      . carvedilol (COREG) 25 MG tablet 25 mg. Take 1/4 tablet twice daily      . Cholecalciferol (VITAMIN D3) 1000 UNITS CAPS Take 2,000 Units by mouth 2 (two) times daily. Monday, Wednesday and friday      . ezetimibe (ZETIA) 10 MG tablet Take 5 mg by mouth daily.       Marland Kitchen gabapentin (NEURONTIN) 600 MG tablet Take 600 mg by mouth daily.      Marland Kitchen losartan (COZAAR) 50 MG tablet Take 50 mg by mouth. Take 1/2 daily      . Magnesium 250 MG TABS Take 1 tablet by mouth 3 (three) times daily.        . ranitidine (ZANTAC) 300 MG capsule Take 300 mg by mouth 2 (two) times daily.         No current facility-administered medications for this visit.     Past Medical History  Diagnosis Date  . Secondary cardiomyopathy, unspecified   . Occlusion and stenosis of carotid artery without mention of cerebral infarction   . Syncope and collapse   . Hypertension   . GERD (gastroesophageal reflux disease)   . Hyperlipemia     ROS:   All systems reviewed and negative except as noted in the HPI.   Past Surgical History  Procedure Laterality Date  . Insert / replace / remove pacemaker      cardiac defibrillator situ 09/2004  . Cardiac catheterization      04/2003     No family history on file.   History   Social History  . Marital Status: Widowed     Spouse Name: N/A    Number of Children: N/A  . Years of Education: N/A   Occupational History  . Not on file.   Social History Main Topics  . Smoking status: Never Smoker   . Smokeless tobacco: Never Used  . Alcohol Use: Not on file  . Drug Use: Not on file  . Sexually Active: Not on file   Other Topics Concern  . Not on file   Social History Narrative  . No narrative on file     BP 142/68  Pulse 72  Ht 5\' 3"  (1.6 m)  Wt 148 lb 9.6 oz (67.405 kg)  BMI 26.33 kg/m2  Physical Exam:  Well appearing elderly woman, NAD HEENT: Unremarkable Neck:  No JVD, no thyromegally Lymphatics:  No adenopathy Back:  No CVA tenderness Lungs:  Clear with no wheezes, rales, or rhonchi HEART:  Regular rate rhythm, no murmurs, no rubs, no clicks Abd:  soft, positive bowel sounds, no organomegally, no rebound, no guarding Ext:  2 plus pulses, no edema, no cyanosis, no clubbing Skin:  No rashes no nodules Neuro:  CN II  through XII intact, motor grossly intact  EKG - nsr with LBBB  DEVICE  Normal device function.  See PaceArt for details.   Assess/Plan:

## 2013-04-30 NOTE — Assessment & Plan Note (Signed)
She is almost at [facility].Marland Kitchen She has not had any ICD therapy. I have discussed the treatment options and with her advanced age, lack of VT therapies and need for a BiV upgrade, I have recommended removing her old device and insertion a new RV pacing lead, LV lead and BiV PPM. I will discuss with Dr. Donnie Aho to be sure he thinks this plan is reasonable.

## 2013-05-04 ENCOUNTER — Telehealth: Payer: Self-pay | Admitting: Internal Medicine

## 2013-05-04 DIAGNOSIS — I509 Heart failure, unspecified: Secondary | ICD-10-CM | POA: Diagnosis not present

## 2013-05-04 DIAGNOSIS — E785 Hyperlipidemia, unspecified: Secondary | ICD-10-CM | POA: Diagnosis not present

## 2013-05-04 DIAGNOSIS — I428 Other cardiomyopathies: Secondary | ICD-10-CM | POA: Diagnosis not present

## 2013-05-04 DIAGNOSIS — I10 Essential (primary) hypertension: Secondary | ICD-10-CM | POA: Diagnosis not present

## 2013-05-04 DIAGNOSIS — Z4502 Encounter for adjustment and management of automatic implantable cardiac defibrillator: Secondary | ICD-10-CM | POA: Diagnosis not present

## 2013-05-04 DIAGNOSIS — I447 Left bundle-branch block, unspecified: Secondary | ICD-10-CM | POA: Diagnosis not present

## 2013-05-04 DIAGNOSIS — Z79899 Other long term (current) drug therapy: Secondary | ICD-10-CM | POA: Diagnosis not present

## 2013-05-04 DIAGNOSIS — K219 Gastro-esophageal reflux disease without esophagitis: Secondary | ICD-10-CM | POA: Diagnosis not present

## 2013-05-04 DIAGNOSIS — I5022 Chronic systolic (congestive) heart failure: Secondary | ICD-10-CM | POA: Diagnosis not present

## 2013-05-04 MED ORDER — GENTAMICIN SULFATE 40 MG/ML IJ SOLN
80.0000 mg | INTRAMUSCULAR | Status: DC
  Filled 2013-05-04: qty 2

## 2013-05-04 MED ORDER — CEFAZOLIN SODIUM-DEXTROSE 2-3 GM-% IV SOLR
2.0000 g | INTRAVENOUS | Status: DC
  Filled 2013-05-04 (×2): qty 50

## 2013-05-04 NOTE — Telephone Encounter (Signed)
New problem   Pt's daughter need to speak to nurse concerning her moms sx tomorrow morning. Please call

## 2013-05-04 NOTE — Telephone Encounter (Signed)
Spoke with patient's daughter and explained the procedure to her  She is ok with everything as it stands for tomorrow

## 2013-05-05 ENCOUNTER — Encounter (HOSPITAL_COMMUNITY): Payer: Self-pay | Admitting: General Practice

## 2013-05-05 ENCOUNTER — Encounter (HOSPITAL_COMMUNITY)
Admission: RE | Disposition: A | Discharge status: Home / Self Care | Payer: Self-pay | Source: Ambulatory Visit | Attending: Internal Medicine

## 2013-05-05 ENCOUNTER — Ambulatory Visit (HOSPITAL_COMMUNITY)
Admission: RE | Admit: 2013-05-05 | Discharge: 2013-05-06 | Disposition: A | Discharge status: Home / Self Care | Payer: Medicare Other | Source: Ambulatory Visit | Attending: Internal Medicine | Admitting: Internal Medicine

## 2013-05-05 DIAGNOSIS — I10 Essential (primary) hypertension: Secondary | ICD-10-CM | POA: Insufficient documentation

## 2013-05-05 DIAGNOSIS — I5022 Chronic systolic (congestive) heart failure: Secondary | ICD-10-CM

## 2013-05-05 DIAGNOSIS — K219 Gastro-esophageal reflux disease without esophagitis: Secondary | ICD-10-CM | POA: Insufficient documentation

## 2013-05-05 DIAGNOSIS — I428 Other cardiomyopathies: Secondary | ICD-10-CM | POA: Insufficient documentation

## 2013-05-05 DIAGNOSIS — I447 Left bundle-branch block, unspecified: Secondary | ICD-10-CM | POA: Insufficient documentation

## 2013-05-05 DIAGNOSIS — Z4502 Encounter for adjustment and management of automatic implantable cardiac defibrillator: Secondary | ICD-10-CM | POA: Insufficient documentation

## 2013-05-05 DIAGNOSIS — I4460 Unspecified fascicular block: Secondary | ICD-10-CM | POA: Diagnosis not present

## 2013-05-05 DIAGNOSIS — I509 Heart failure, unspecified: Secondary | ICD-10-CM | POA: Insufficient documentation

## 2013-05-05 DIAGNOSIS — Z79899 Other long term (current) drug therapy: Secondary | ICD-10-CM | POA: Insufficient documentation

## 2013-05-05 DIAGNOSIS — E785 Hyperlipidemia, unspecified: Secondary | ICD-10-CM | POA: Insufficient documentation

## 2013-05-05 HISTORY — PX: BIV PACEMAKER GENERATOR CHANGE OUT: SHX5746

## 2013-05-05 HISTORY — DX: Presence of cardiac pacemaker: Z95.0

## 2013-05-05 HISTORY — PX: BI-VENTRICULAR PACEMAKER UPGRADE: SHX5464

## 2013-05-05 HISTORY — DX: Type 2 diabetes mellitus without complications: E11.9

## 2013-05-05 LAB — GLUCOSE, CAPILLARY
Glucose-Capillary: 103 mg/dL — ABNORMAL HIGH (ref 70–99)
Glucose-Capillary: 97 mg/dL (ref 70–99)

## 2013-05-05 LAB — SURGICAL PCR SCREEN: Staphylococcus aureus: NEGATIVE

## 2013-05-05 SURGERY — BI-VENTRICULAR PACEMAKER UPGRADE
Anesthesia: LOCAL

## 2013-05-05 MED ORDER — MUPIROCIN 2 % EX OINT: TOPICAL_OINTMENT | Freq: Two times a day (BID) | CUTANEOUS | Status: DC

## 2013-05-05 MED ORDER — FENTANYL CITRATE 0.05 MG/ML IJ SOLN
INTRAMUSCULAR | Status: AC
  Filled 2013-05-05: qty 2

## 2013-05-05 MED ORDER — LIVING WELL WITH DIABETES BOOK
Freq: Once | Status: AC
  Administered 2013-05-05: 22:00:00
  Filled 2013-05-05: qty 1

## 2013-05-05 MED ORDER — LOSARTAN POTASSIUM 25 MG PO TABS
25.0000 mg | ORAL_TABLET | Freq: Every day | ORAL | Status: DC
  Administered 2013-05-05 – 2013-05-06 (×2): 25 mg via ORAL
  Filled 2013-05-05 (×2): qty 1

## 2013-05-05 MED ORDER — ALUM & MAG HYDROXIDE-SIMETH 200-200-20 MG/5ML PO SUSP
15.0000 mL | Freq: Three times a day (TID) | ORAL | Status: DC | PRN
  Administered 2013-05-05 – 2013-05-06 (×2): 15 mL via ORAL
  Filled 2013-05-05 (×2): qty 30

## 2013-05-05 MED ORDER — YOU HAVE A PACEMAKER BOOK
Freq: Once | Status: AC
  Administered 2013-05-05: 22:00:00
  Filled 2013-05-05: qty 1

## 2013-05-05 MED ORDER — ONDANSETRON HCL 4 MG/2ML IJ SOLN: 4.0000 mg | Freq: Four times a day (QID) | INTRAMUSCULAR | Status: DC | PRN

## 2013-05-05 MED ORDER — SODIUM CHLORIDE 0.9 % IR SOLN
80.0000 mg | Status: DC
  Filled 2013-05-05: qty 2

## 2013-05-05 MED ORDER — SODIUM CHLORIDE 0.9 % IV SOLN
INTRAVENOUS | Status: DC
  Administered 2013-05-05: 07:00:00 via INTRAVENOUS

## 2013-05-05 MED ORDER — MUPIROCIN 2 % EX OINT
TOPICAL_OINTMENT | CUTANEOUS | Status: AC
  Administered 2013-05-05: 07:00:00
  Filled 2013-05-05: qty 22

## 2013-05-05 MED ORDER — BUMETANIDE 0.5 MG PO TABS
0.5000 mg | ORAL_TABLET | ORAL | Status: DC
  Administered 2013-05-06: 10:00:00 0.5 mg via ORAL
  Filled 2013-05-05: qty 1

## 2013-05-05 MED ORDER — CARVEDILOL 6.25 MG PO TABS
6.2500 mg | ORAL_TABLET | Freq: Two times a day (BID) | ORAL | Status: DC
  Administered 2013-05-05 – 2013-05-06 (×2): 6.25 mg via ORAL
  Filled 2013-05-05 (×4): qty 1

## 2013-05-05 MED ORDER — LIDOCAINE HCL (PF) 1 % IJ SOLN
INTRAMUSCULAR | Status: AC
  Filled 2013-05-05: qty 60

## 2013-05-05 MED ORDER — GABAPENTIN 600 MG PO TABS
600.0000 mg | ORAL_TABLET | Freq: Every day | ORAL | Status: DC
  Administered 2013-05-05 – 2013-05-06 (×2): 600 mg via ORAL
  Filled 2013-05-05 (×2): qty 1

## 2013-05-05 MED ORDER — ACETAMINOPHEN 325 MG PO TABS
325.0000 mg | ORAL_TABLET | ORAL | Status: DC | PRN
  Administered 2013-05-05: 650 mg via ORAL
  Filled 2013-05-05: qty 2

## 2013-05-05 MED ORDER — MIDAZOLAM HCL 5 MG/5ML IJ SOLN
INTRAMUSCULAR | Status: AC
  Filled 2013-05-05: qty 5

## 2013-05-05 MED ORDER — MAGNESIUM GLUCONATE 500 MG PO TABS
500.0000 mg | ORAL_TABLET | Freq: Two times a day (BID) | ORAL | Status: DC
  Administered 2013-05-05 – 2013-05-06 (×3): 500 mg via ORAL
  Filled 2013-05-05 (×4): qty 1

## 2013-05-05 MED ORDER — CEFAZOLIN SODIUM-DEXTROSE 2-3 GM-% IV SOLR
2.0000 g | Freq: Four times a day (QID) | INTRAVENOUS | Status: AC
  Administered 2013-05-05 – 2013-05-06 (×3): 2 g via INTRAVENOUS
  Filled 2013-05-05 (×3): qty 50

## 2013-05-05 MED ORDER — EZETIMIBE 10 MG PO TABS
5.0000 mg | ORAL_TABLET | Freq: Every day | ORAL | Status: DC
  Administered 2013-05-05 – 2013-05-06 (×2): 5 mg via ORAL
  Filled 2013-05-05 (×2): qty 0.5

## 2013-05-05 NOTE — Op Note (Signed)
ICD generator removal with capping of old ICD lead and insertion of a new RV lead, and LV lead and a BiV PPM without immediate complication. [id number].

## 2013-05-05 NOTE — H&P (View-Only) (Signed)
HPI [name] returns today for followup. She is a very pleasant 79 yo woman with a h/o a non-ischemic CM, chronic systolic CHF, class 2, and LBBB, s/p ICD implant. She notes increasing dyspnea over the past month. She denies dietary indiscretion. No syncope or ICD shock. She has had mild peripheral edema. She denies medical non-compliance.  No Known Allergies   Current Outpatient Prescriptions  Medication Sig Dispense Refill  . aspirin 81 MG tablet Take 81 mg by mouth daily.        . b complex vitamins capsule Take 1 capsule by mouth 2 (two) times daily.       . bumetanide (BUMEX) 0.5 MG tablet 0.5 mg. Take 1/2 tablet Monday, Thursday, and Saturday      . carvedilol (COREG) 25 MG tablet 25 mg. Take 1/4 tablet twice daily      . Cholecalciferol (VITAMIN D3) 1000 UNITS CAPS Take 2,000 Units by mouth 2 (two) times daily. Monday, Wednesday and friday      . ezetimibe (ZETIA) 10 MG tablet Take 5 mg by mouth daily.       . gabapentin (NEURONTIN) 600 MG tablet Take 600 mg by mouth daily.      . losartan (COZAAR) 50 MG tablet Take 50 mg by mouth. Take 1/2 daily      . Magnesium 250 MG TABS Take 1 tablet by mouth 3 (three) times daily.        . ranitidine (ZANTAC) 300 MG capsule Take 300 mg by mouth 2 (two) times daily.         No current facility-administered medications for this visit.     Past Medical History  Diagnosis Date  . Secondary cardiomyopathy, unspecified   . Occlusion and stenosis of carotid artery without mention of cerebral infarction   . Syncope and collapse   . Hypertension   . GERD (gastroesophageal reflux disease)   . Hyperlipemia     ROS:   All systems reviewed and negative except as noted in the HPI.   Past Surgical History  Procedure Laterality Date  . Insert / replace / remove pacemaker      cardiac defibrillator situ 09/2004  . Cardiac catheterization      04/2003     No family history on file.   History   Social History  . Marital Status: Widowed     Spouse Name: N/A    Number of Children: N/A  . Years of Education: N/A   Occupational History  . Not on file.   Social History Main Topics  . Smoking status: Never Smoker   . Smokeless tobacco: Never Used  . Alcohol Use: Not on file  . Drug Use: Not on file  . Sexually Active: Not on file   Other Topics Concern  . Not on file   Social History Narrative  . No narrative on file     BP 142/68  Pulse 72  Ht 5' 3" (1.6 m)  Wt 148 lb 9.6 oz (67.405 kg)  BMI 26.33 kg/m2  Physical Exam:  Well appearing elderly woman, NAD HEENT: Unremarkable Neck:  No JVD, no thyromegally Lymphatics:  No adenopathy Back:  No CVA tenderness Lungs:  Clear with no wheezes, rales, or rhonchi HEART:  Regular rate rhythm, no murmurs, no rubs, no clicks Abd:  soft, positive bowel sounds, no organomegally, no rebound, no guarding Ext:  2 plus pulses, no edema, no cyanosis, no clubbing Skin:  No rashes no nodules Neuro:  CN II   through XII intact, motor grossly intact  EKG - nsr with LBBB  DEVICE  Normal device function.  See PaceArt for details.   Assess/Plan: 

## 2013-05-05 NOTE — Interval H&P Note (Signed)
History and Physical Interval Note:  05/05/2013 7:22 AM  Larrie Kass  has presented today for surgery, with the diagnosis of Upgrade  The various methods of treatment have been discussed with the patient and family. After consideration of risks, benefits and other options for treatment, the patient has consented to  Procedure(s): BI-VENTRICULAR PACEMAKER UPGRADE (N/A) as a surgical intervention .  The patient's history has been reviewed, patient examined, no change in status, stable for surgery.  I have reviewed the patient's chart and labs.  Questions were answered to the patient's satisfaction.     Lewayne Bunting

## 2013-05-06 ENCOUNTER — Telehealth: Payer: Self-pay | Admitting: Internal Medicine

## 2013-05-06 ENCOUNTER — Encounter (HOSPITAL_COMMUNITY): Payer: Self-pay | Admitting: *Deleted

## 2013-05-06 ENCOUNTER — Ambulatory Visit (HOSPITAL_COMMUNITY): Payer: Medicare Other

## 2013-05-06 DIAGNOSIS — I428 Other cardiomyopathies: Secondary | ICD-10-CM | POA: Diagnosis not present

## 2013-05-06 DIAGNOSIS — Z95810 Presence of automatic (implantable) cardiac defibrillator: Secondary | ICD-10-CM | POA: Diagnosis not present

## 2013-05-06 DIAGNOSIS — I509 Heart failure, unspecified: Secondary | ICD-10-CM | POA: Diagnosis not present

## 2013-05-06 DIAGNOSIS — I5022 Chronic systolic (congestive) heart failure: Secondary | ICD-10-CM | POA: Diagnosis not present

## 2013-05-06 DIAGNOSIS — Z4502 Encounter for adjustment and management of automatic implantable cardiac defibrillator: Secondary | ICD-10-CM | POA: Diagnosis not present

## 2013-05-06 LAB — GLUCOSE, CAPILLARY: Glucose-Capillary: 97 mg/dL (ref 70–99)

## 2013-05-06 NOTE — Progress Notes (Signed)
Patient ID: [name], female   DOB: December 01, 1925, 79 y.o.   MRN: [id number] Subjective:  " I feel good", denies chest pain  Objective:  Vital Signs in the last 24 hours: Temp:  [97.7 F (36.5 C)-98.4 F (36.9 C)] 98.1 F (36.7 C) (05/29 0439) Pulse Rate:  [64-82] 68 (05/29 0439) Resp:  [16-20] 20 (05/29 0439) BP: (126-167)/(41-125) 167/58 mmHg (05/29 0439) SpO2:  [93 %-97 %] 96 % (05/29 0439) Weight:  [159 lb 9.8 oz (72.4 kg)] 159 lb 9.8 oz (72.4 kg) (05/29 0016)  Intake/Output from previous day: 05/28 0701 - 05/29 0700 In: 530 [P.O.:480; IV Piggyback:50] Out: 1600 [Urine:1600] Intake/Output from this shift:    Physical Exam: Well appearing elderly woman,NAD HEENT: Unremarkable Neck:  No JVD, no thyromegally Lungs:  Clear, with no wheezes, rales, or rhonchi, PM wound clean and dry HEART:  Regular rate rhythm, no murmurs, no rubs, no clicks Abd:  Flat, positive bowel sounds, no organomegally, no rebound, no guarding Ext:  2 plus pulses, no edema, no cyanosis, no clubbing Skin:  No rashes no nodules Neuro:  CN II through XII intact, motor grossly intact  Lab Results: No results found for this basename: WBC, HGB, PLT,  in the last 72 hours No results found for this basename: NA, K, CL, CO2, GLUCOSE, BUN, CREATININE,  in the last 72 hours No results found for this basename: TROPONINI, CK, MB,  in the last 72 hours Hepatic Function Panel No results found for this basename: PROT, ALBUMIN, AST, ALT, ALKPHOS, BILITOT, BILIDIR, IBILI,  in the last 72 hours No results found for this basename: CHOL,  in the last 72 hours No results found for this basename: PROTIME,  in the last 72 hours  Imaging: No results found.  Cardiac Studies: Tele - NSR with BiV pacing Assessment/Plan:  1. S/p BiV Pacemaker upgrade 2. ICD removal at ERI 3. Non-ischemic CM 4. Class 3 CHF, EF 30% Rec: ok for discharge. Usual followup.   Gregg Taylor,M.D.  LOS: 1 day    Lewayne Bunting 05/06/2013,  7:28 AM

## 2013-05-06 NOTE — Op Note (Signed)
NAMEAANCHAL, COPE NO.:  [id number]  MEDICAL RECORD NO.:  [id number]  LOCATION:  6531                         FACILITY:  [facility]  PHYSICIAN:  Doylene Canning. Ladona Ridgel, MD    DATE OF BIRTH:  [birth year]  DATE OF PROCEDURE:  05/05/2013 DATE OF DISCHARGE:                              OPERATIVE REPORT   PROCEDURE PERFORMED:  Removal of a previously implanted dual-chamber ICD, which was approaching elective replacement and insertion of a new biventricular pacemaker with capping of the old ICD lead, and insertion of a new right ventricular as well as left ventricular pacing lead.  INTRODUCTION:  The patient is a very pleasant 79 year old woman with a nonischemic cardiomyopathy and chronic systolic heart failure, which has worsened in the last several months.  Her symptoms were class II and now they are class III.  The patient is essentially at elective replacement. Her voltage was 0.01 V below ERI.  Because of her worsening heart failure symptoms, she is now referred for removal of old dual-chamber ICD generator, capping of the old defibrillator lead, and insertion of a new right ventricular pacing lead and a left ventricular pacing lead in the setting of chronic systolic heart failure, ejection fraction of 30%, and left bundle-branch block.  PROCEDURE:  After informed consent was obtained, the patient was taken to the diagnostic EP lab in a fasting state.  After usual preparation and draping, intravenous fentanyl and midazolam was given for sedation. 30 mL of lidocaine was infiltrated into the left infraclavicular region. A 6-cm incision was carried out over this region.  Electrocautery was utilized to dissect down to the fascial plane.  Care was taken to initially not to enter the ICD pocket.  The left subclavian vein was punctured x2 and the Medtronic model 5076 52 cm active fixation pacing lead, serial number [id number] was advanced into the right ventricle and mapping  carried out.  At the final site, the R-waves were 13 mV and the pacing impedance was 788 ohms.  Pacing threshold 0.5 V at 0.5 milliseconds with the lead actively fixed, and there was a good injury current with active fixation of the lead.  10 V pacing did not stimulate the diaphragm.  With the right ventricular lead in satisfactory position, the 7-French guiding catheter along with a 6-French hexapolar EP catheter was inserted into the left subclavian vein by way of a 9.5- French safe sheath.  The guiding catheter and the 6-French hexapolar EP catheter were maneuvered in the right atrium and the coronary sinus was cannulated.  Venography of the coronary sinus was then carried out demonstrating a large lateral vein, which had a S curve.  The Medtronic inner catheter was utilized along with the MB2 guiding catheter and an 0.14 angioplasty guidewire was advanced into the lateral vein.  The inner catheter was removed and the Medtronic model 4194 88 cm bipolar pacing lead, serial number [id number] was advanced over the angioplasty guidewire and into the lateral vein of the left ventricle.  At the initial site approximately two-thirds from base to apex, there was ultimately diaphragmatic stimulation, for which we had difficult time programming around.  This was manifested after  hooking the LV lead to the BiV pacemaker.  Because of concerns about inadequate pacing safety margins and diaphragmatic stimulation threshold margins, the LV lead was disconnected from the BiV pacemaker and freed up from its silk suture. The 0.14 angioplasty guidewire was readvanced into the lateral vein and the LV lead was withdrawn slightly to a position approximately 1/3rd from base to apex.  In this location, there was no diaphragmatic stimulation, and the pacing threshold was actually less than 1 V at 0.5 milliseconds.  With these satisfactory parameters, the guiding catheter was liberated from the LV pacing lead in  the usual manner.  The LV lead and the RV leads were secured to the subpectoral fascia with a figure-of- eight silk suture and the sewing sleeves were secured with silk suture. At this point, electrocautery was utilized to enter the ICD pocket, and the dense fibrous adhesions were freed up from the ICD pocket.  The generator was removed with gentle traction.  The old defibrillator lead was capped.  The atrial lead was evaluated and found to be working satisfactorily.  With these satisfactory parameters, the new Medtronic BiV pacemaker, serial number [id number] was connected to the RV and LV leads and placed back in the subcutaneous pocket along with connection of the atrial lead as well.  The pocket was irrigated with antibiotic irrigation and the incision was closed with 2-0 and 3-0 Vicryl.  Benzoin and Steri-Strips were painted on the skin and the patient was returned to her room in satisfactory condition.  COMPLICATIONS:  There were no immediate procedure complications.  RESULTS:  This demonstrates successful implantation of a Medtronic biventricular pacemaker utilizing the existing old right atrial lead. It also demonstrates successful capping and removal of the previously implanted BiV ICD lead and BiV ICD generator, which had approached elective replacement.  Please note, the patient's ejection fraction is 30%.     Doylene Canning. Ladona Ridgel, MD     GWT/MEDQ  D:  05/05/2013  T:  05/06/2013  Job:  [id number]  cc:   Georga Hacking, M.D.

## 2013-05-06 NOTE — Discharge Summary (Signed)
ELECTROPHYSIOLOGY PROCEDURE DISCHARGE SUMMARY    Patient ID: [name],  MRN: [id number], DOB/AGE: 77-Sep-1926 77 y.o.  Admit date: 05/05/2013 Discharge date: 05/06/2013  Primary Care Physician: Buren Kos, MD Primary Cardiologist: Viann Fish, MD  Primary Discharge Diagnosis:  Non ischemic cardiomyopathy with previously implanted defibrillator at [facility], CHF, and LBBB s/p CRTP upgrade this admission.  Secondary Discharge Diagnosis:  1.  Hypertension 2.  GERD 3.  Hyperlipidemia   Procedures This Admission:  1.  Upgrade of previously implanted dual chamber defibrillator to a CRT-P on 05-05-2013 by Dr Ladona Ridgel.  The patient received a Medtronic CRTP with the previously implanted 5076 right atrial lead used.  A new 5076 right ventricular lead was placed as well as a 4194 left ventricular lead.  There were no early apparent complications. 2.  CXR on 05-06-2013 demonstrated no pneumothorax status post device upgrade.   Brief HPI: The patient is a very pleasant 79 year old woman with a nonischemic cardiomyopathy and chronic systolic heart failure, which has worsened in the last several months. Her symptoms were class II and now they are class III. The patient is essentially at elective replacement. Her voltage was 0.01 V below ERI. Because of her worsening heart failure symptoms, she is now referred for removal of old dual-chamber ICD generator, capping of the old defibrillator lead, and insertion of a new right ventricular pacing lead and a left ventricular pacing lead in the setting of chronic systolic heart failure, ejection fraction of 30%, and left bundle-branch block.  Hospital Course:  The patient was admitted and underwent upgrade of her previously implanted dual chamber defibrillator to a Medtronic CRTP with details as outlined above.   She was monitored on telemetry overnight which demonstrated sinus rhythm with ventricular pacing.  Left chest was without hematoma or ecchymosis.   The device was interrogated and found to be functioning normally.  CXR was obtained and demonstrated no pneumothorax status post device implantation.  Wound care, arm mobility, and restrictions were reviewed with the patient.  Dr Ladona Ridgel examined the patient and considered them stable for discharge to home.    Discharge Vitals: Blood pressure 116/50, pulse 68, temperature 97.8 F (36.6 C), temperature source Oral, resp. rate 18, height 5\' 3"  (1.6 m), weight 159 lb 9.8 oz (72.4 kg), SpO2 97.00%.    Labs:   Lab Results  Component Value Date   WBC 8.7 04/30/2013   HGB 13.8 04/30/2013   HCT 40.3 04/30/2013   MCV 89.9 04/30/2013   PLT 231.0 04/30/2013     Recent Labs Lab 04/30/13 1014  NA 142  K 4.8  CL 106  CO2 28  BUN 17  CREATININE 1.3*  CALCIUM 9.8  GLUCOSE 106*    Discharge Medications:    Medication List    TAKE these medications       aspirin 81 MG tablet  Take 81 mg by mouth daily.     b complex vitamins capsule  Take 1 capsule by mouth 2 (two) times daily.     bumetanide 0.5 MG tablet  Commonly known as:  BUMEX  Take 0.5 mg by mouth daily. Take 1/2 tablet Monday, Thursday, and Saturday     carvedilol 25 MG tablet  Commonly known as:  COREG  Take 25 mg by mouth 2 (two) times daily with a meal. Take 1/4 tablet twice daily     ezetimibe 10 MG tablet  Commonly known as:  ZETIA  Take 5 mg by mouth daily.  gabapentin 600 MG tablet  Commonly known as:  NEURONTIN  Take 600 mg by mouth daily.     losartan 50 MG tablet  Commonly known as:  COZAAR  Take 25 mg by mouth daily. Take 1/2 daily     magnesium gluconate 500 MG tablet  Commonly known as:  MAGONATE  Take 500 mg by mouth 2 (two) times daily.     ranitidine 300 MG capsule  Commonly known as:  ZANTAC  Take 300 mg by mouth daily.     Vitamin D3 1000 UNITS Caps  Take 2,000 Units by mouth 2 (two) times daily. Monday, Wednesday and friday        Disposition:  Discharge Orders   Future Appointments  Provider Department Dept Phone   05/13/2013 10:00 AM [facility] [employer] Main Office [place]) [phone]   05/19/2013 2:30 PM Melvyn Novas, MD [facility] [phone]   08/06/2013 2:15 PM Marinus Maw, MD [facility] Main Office [place]) [phone]   Future Orders Complete By Expires     Diet - low sodium heart healthy  As directed     Discharge instructions  As directed     Comments:      Please see post device implant discharge instructions    Increase activity slowly  As directed       Follow-up Information   Follow up with [facility] On 05/13/2013. (At 11:00 AM for wound check)    Contact information:   [address] [place] [address] [phone]      Follow up with Lewayne Bunting, MD On 08/06/2013. (At 2:15 PM)    Contact information:   [address] [place] [address] [phone]      Duration of Discharge Encounter: Greater than 30 minutes including physician time.  Signed, Gypsy Balsam, RN, BSN 05/06/2013, 2:13 PM  EP Attending  Patient seen and examined. Agree with above. Leonia Reeves.D.

## 2013-05-06 NOTE — Telephone Encounter (Signed)
Spoke with daughter and went over post PPM instructions.  She is going to get her mother some non prescription thigh-high hose until she is able to pull the pantyhose ones up

## 2013-05-06 NOTE — Telephone Encounter (Signed)
New problem    pts daughter has questions

## 2013-05-13 ENCOUNTER — Ambulatory Visit: Payer: Medicare Other

## 2013-05-17 ENCOUNTER — Ambulatory Visit (INDEPENDENT_AMBULATORY_CARE_PROVIDER_SITE_OTHER): Payer: Medicare Other | Admitting: *Deleted

## 2013-05-17 DIAGNOSIS — I428 Other cardiomyopathies: Secondary | ICD-10-CM | POA: Diagnosis not present

## 2013-05-17 DIAGNOSIS — I5022 Chronic systolic (congestive) heart failure: Secondary | ICD-10-CM

## 2013-05-17 LAB — PACEMAKER DEVICE OBSERVATION
ATRIAL PACING PM: 10.71
BAMS-0001: 150 {beats}/min
BATTERY VOLTAGE: 3.0744 V
LV LEAD THRESHOLD: 1.25 V
VENTRICULAR PACING PM: 98.4

## 2013-05-17 NOTE — Progress Notes (Signed)
Wound check-PPM 

## 2013-05-19 ENCOUNTER — Ambulatory Visit (INDEPENDENT_AMBULATORY_CARE_PROVIDER_SITE_OTHER): Payer: Medicare Other | Admitting: Neurology

## 2013-05-19 ENCOUNTER — Encounter: Payer: Self-pay | Admitting: Neurology

## 2013-05-19 VITALS — BP 135/75 | HR 74 | Temp 98.5°F | Ht 63.0 in | Wt 153.0 lb

## 2013-05-19 DIAGNOSIS — Z95810 Presence of automatic (implantable) cardiac defibrillator: Secondary | ICD-10-CM

## 2013-05-19 DIAGNOSIS — R55 Syncope and collapse: Secondary | ICD-10-CM

## 2013-05-19 NOTE — Progress Notes (Addendum)
[facility]  Provider:  Dr Kean Gautreau Referring Provider: Juluis Mire, [name] Primary Care Physician:  [name]  Chief Complaint  Patient presents with  . New Evaluation    [place], syncopal episodes, paper referral,     HPI:  [name] is a 79 y.o. female here as a referral from Dr. Arelia Sneddon , her gynaecologist for  Syncope . [name] was last seen by me on 04/24/2010 she's now we referred. She is a right-handed, Caucasian 79 year old female who stated that she had suffered from spelled characterized as a feeling of wine wave coming over her but only once did she pass out. She was not sure how long she may have been losing awareness however she had not fallen. Her cardiologist evaluated her defibrillator she had received the machine in 2006 within  3 years  She was told that one" of the leads was on recall "and that the battery was going out.  Nor recorded the defibrillator any spells. Sometimes her near fainting spells were associated with her vision " going black".  This occurred without any physical strain or any trigger that she knew of.  She had no classic symptoms of vertigo chief the night feeling and motion when she was not moving at all and her spells have not been witnessed by others as at time make or seizure-like.   On 05/01/2010 a CT of the head showed mild atrophy and chronic small vessel disease. Carotid Dopplers were negative. Emboli monitoring was negative. We felt that her symptoms may be more likely related to cardiac problems than to TIA or stroke, and clinically there was no evidence of epilepsy.  She presented with shortness of breath earlier this months to her cardiologist, which followed her for her the defibrillator. He referred her for a pacemaker implantation which was performed on 05/05/2013. The patient still has soreness in the left shoulder and upper extremity. The patient had since only 2 very slight shortness of breath attacks  and no dizzy spells or near fainting spells.     Has a strong strong family history of sudden cardiac death, her oldest son died at age 78 she lost her nephew is possibly her father, who was a pacemaker patient.   Review of Systems: Out of a complete 14 system review, the patient complains of only the following symptoms, and all other reviewed systems are negative.  no longer present - syncope is resolved.   History   Social History  . Marital Status: Widowed    Spouse Name: N/A    Number of Children: 2  . Years of Education: 12   Occupational History  . Not on file.   Social History Main Topics  . Smoking status: Passive Smoke Exposure - Never Smoker  . Smokeless tobacco: Never Used  . Alcohol Use: No  . Drug Use: No  . Sexually Active: Not on file   Other Topics Concern  . Not on file   Social History Narrative  . No narrative on file    Family History  Problem Relation Age of Onset  . Cancer Maternal Grandmother     breast    Past Medical History  Diagnosis Date  . Secondary cardiomyopathy, unspecified   . Occlusion and stenosis of carotid artery without mention of cerebral infarction   . Syncope and collapse   . Hypertension   . GERD (gastroesophageal reflux disease)   . Hyperlipemia   . Dysrhythmia     LBBB  .  Biventricular cardiac pacemaker in situ 05/05/2013    UPGRADE with Dr Ladona Ridgel; Medtronic; previously implanted dual chamber ICD  . Shortness of breath   . Diabetes mellitus without complication   . CHF (congestive heart failure)   . Arthritis   . Heart disease   . A-fib     Past Surgical History  Procedure Laterality Date  . Cardiac defibrillator placement  09/2004    MDT ICD implanted for primary prevention; device upgrade to CRTP in 04-2013  . Cardiac catheterization      04/2003  . Appendectomy    . Abdominal hysterectomy    . Biv pacemaker generator change out  05/05/2013    MDT CRTP upgrade (previously implanted dual chamber ICD) by  Dr Ladona Ridgel.  New RV/LV leads placed.   . Cardiac defibrillator placement      batteries gave up with 3 years , implanted in 2006    Current Outpatient Prescriptions  Medication Sig Dispense Refill  . aspirin 81 MG tablet Take 81 mg by mouth daily.       Marland Kitchen b complex vitamins capsule Take 1 capsule by mouth daily.       . bumetanide (BUMEX) 0.5 MG tablet Take 0.5 mg by mouth daily. Take 1/2 tablet Monday, Thursday, and Saturday      . carvedilol (COREG) 25 MG tablet Take 25 mg by mouth 2 (two) times daily with a meal. Take 1/4 tablet twice daily      . Cholecalciferol (VITAMIN D3) 1000 UNITS CAPS Take 1,000 Units by mouth daily. Monday, Wednesday and Friday,other days take 2 daily      . ezetimibe (ZETIA) 10 MG tablet Take 10 mg by mouth daily. Take 1/4 daily      . gabapentin (NEURONTIN) 600 MG tablet Take 600 mg by mouth daily.      Marland Kitchen losartan (COZAAR) 50 MG tablet Take 25 mg by mouth daily. Take 1/2 daily      . magnesium gluconate (MAGONATE) 500 MG tablet Take 500 mg by mouth 2 (two) times daily.      . ranitidine (ZANTAC) 300 MG capsule Take 300 mg by mouth daily.        No current facility-administered medications for this visit.    Allergies as of 05/19/2013  . (No Known Allergies)    Vitals: BP 135/75  Pulse 74  Temp(Src) 98.5 F (36.9 C) (Oral)  Ht 5\' 3"  (1.6 m)  Wt 153 lb (69.4 kg)  BMI 27.11 kg/m2 Last Weight:  Wt Readings from Last 1 Encounters:  05/19/13 153 lb (69.4 kg)   Last Height:   Ht Readings from Last 1 Encounters:  05/19/13 5\' 3"  (1.6 m)    Physical exam:  General: The patient is awake, alert and appears not in acute distress. The patient is well groomed. Head: Normocephalic, atraumatic. Neck is supple. Cardiovascular:  Regular rate and rhythm, without  murmurs or carotid bruit, and without distended neck veins. Respiratory: Lungs are clear to auscultation. Skin:  Without evidence of edema, or rash Trunk: BMI is normal, as is  posture.  Neurologic  exam : The patient is awake and alert, oriented to place and time.  Memory subjective  described as intact. There is a normal attention span & concentration ability. Speech is fluent without \\dysarthria , dysphonia or aphasia.  Mood and affect are appropriate.  Cranial nerves: Pupils are equal and briskly reactive to light.  Extraocular movements  in vertical and horizontal planes intact and without nystagmus. Visual fields  by finger perimetry are intact. Hearing to finger rub intact.  Facial sensation intact to fine touch. Facial motor strength is symmetric and tongue and uvula move midline.  Motor exam: Normal tone and normal muscle bulk and symmetric normal strength in all extremities. She is sore in the left shoulder and arm, due to the left pacemaker implant 05-05-13 .  Sensory:  Fine touch, pinprick and vibration were tested in all extremities. Proprioception is tested in the upper extremities only. This was  normal.  Coordination: Rapid alternating movements in the fingers/hands is tested and normal. Finger-to-nose maneuver tested and normal without evidence of ataxia, dysmetria or tremor.  Gait and station: Patient walks without assistive device and is able and assisted stool climb up to the exam table. Strength within normal limits. Stance is stable and normal. Steps are unfragmented.  Deep tendon reflexes: in the  upper and lower extremities are symmetric and intact. Babinski maneuver response is  downgoing.   Assessment:  After physical and neurologic examination, review of laboratory studies, imaging, neurophysiology testing and pre-existing records, has no evidence of recurrent congestive heart failure since her pacemaker was implanted, there is no lower extremity edema and her shortness of breath has markedly improved. She had no syncopal spells nor vertigo spells.  Plan:  Treatment plan and additional workup will be reviewed under Problem List. Continue with cardiology workup and  followup. A.c. indication to place the patient on any other medication or to work or further up for syncope or TIA after she has done so very well with her pacemaker implant. She had no TIA like symptoms either since 2011.  Return to Dr. Viann Fish, [name] .

## 2013-05-19 NOTE — Assessment & Plan Note (Signed)
05-05-13  Dr Donnie Aho.

## 2013-05-19 NOTE — Patient Instructions (Signed)
  Please continue with your cardiology followup with Dr. Viann Fish.  Electrophysiologist is Dr. Sharrell Ku , Corinda Gubler.  The patient had no acute neurologic complaints and actually has done better in terms of shortness of breath lower extremity edema and syncopal or near syncopal spells. There is no need for neurologic followup

## 2013-05-28 ENCOUNTER — Encounter: Payer: Self-pay | Admitting: Cardiology

## 2013-05-28 ENCOUNTER — Telehealth: Payer: Self-pay | Admitting: Internal Medicine

## 2013-05-28 DIAGNOSIS — Z950 Presence of cardiac pacemaker: Secondary | ICD-10-CM | POA: Insufficient documentation

## 2013-05-28 DIAGNOSIS — R55 Syncope and collapse: Secondary | ICD-10-CM | POA: Insufficient documentation

## 2013-05-28 DIAGNOSIS — E782 Mixed hyperlipidemia: Secondary | ICD-10-CM | POA: Insufficient documentation

## 2013-05-28 DIAGNOSIS — I10 Essential (primary) hypertension: Secondary | ICD-10-CM | POA: Insufficient documentation

## 2013-05-28 NOTE — Telephone Encounter (Signed)
New Prob     Pt has some questions regarding her pacemaker. Please call.

## 2013-05-28 NOTE — Telephone Encounter (Signed)
Spoke with patient.   She was concerned that her bra covering her pacemaker would cause the device not to work.  I tried to reassure her that it was ok for clothing to cover the device and that the pacemaker function was within normal limits when we checked her at the wound check appointment on 05/17/13.  She also c/o some SOB and fatigue.  At last interrogation there were no mode switch episodes and all impedances and threshold values were stable.  I advised her to follow up with Dr. Donnie Aho as her primary cardiologist.

## 2013-05-31 DIAGNOSIS — Z961 Presence of intraocular lens: Secondary | ICD-10-CM | POA: Diagnosis not present

## 2013-05-31 DIAGNOSIS — H35379 Puckering of macula, unspecified eye: Secondary | ICD-10-CM | POA: Diagnosis not present

## 2013-06-01 DIAGNOSIS — I428 Other cardiomyopathies: Secondary | ICD-10-CM | POA: Diagnosis not present

## 2013-06-01 DIAGNOSIS — Z95810 Presence of automatic (implantable) cardiac defibrillator: Secondary | ICD-10-CM | POA: Diagnosis not present

## 2013-06-01 DIAGNOSIS — I509 Heart failure, unspecified: Secondary | ICD-10-CM | POA: Diagnosis not present

## 2013-06-01 DIAGNOSIS — E785 Hyperlipidemia, unspecified: Secondary | ICD-10-CM | POA: Diagnosis not present

## 2013-06-01 DIAGNOSIS — I6529 Occlusion and stenosis of unspecified carotid artery: Secondary | ICD-10-CM | POA: Diagnosis not present

## 2013-06-01 DIAGNOSIS — I951 Orthostatic hypotension: Secondary | ICD-10-CM | POA: Diagnosis not present

## 2013-06-01 DIAGNOSIS — R0602 Shortness of breath: Secondary | ICD-10-CM | POA: Diagnosis not present

## 2013-06-01 DIAGNOSIS — I10 Essential (primary) hypertension: Secondary | ICD-10-CM | POA: Diagnosis not present

## 2013-06-10 DIAGNOSIS — M26609 Unspecified temporomandibular joint disorder, unspecified side: Secondary | ICD-10-CM | POA: Diagnosis not present

## 2013-06-10 DIAGNOSIS — J309 Allergic rhinitis, unspecified: Secondary | ICD-10-CM | POA: Diagnosis not present

## 2013-06-21 ENCOUNTER — Emergency Department (HOSPITAL_COMMUNITY): Payer: Medicare Other

## 2013-06-21 ENCOUNTER — Observation Stay (HOSPITAL_COMMUNITY)
Admission: EM | Admit: 2013-06-21 | Discharge: 2013-06-23 | Disposition: A | Discharge status: Home / Self Care | Payer: Medicare Other | Attending: Internal Medicine | Admitting: Internal Medicine

## 2013-06-21 ENCOUNTER — Encounter (HOSPITAL_COMMUNITY): Payer: Self-pay | Admitting: *Deleted

## 2013-06-21 DIAGNOSIS — R4789 Other speech disturbances: Principal | ICD-10-CM | POA: Insufficient documentation

## 2013-06-21 DIAGNOSIS — Z79899 Other long term (current) drug therapy: Secondary | ICD-10-CM | POA: Diagnosis not present

## 2013-06-21 DIAGNOSIS — E1149 Type 2 diabetes mellitus with other diabetic neurological complication: Secondary | ICD-10-CM | POA: Insufficient documentation

## 2013-06-21 DIAGNOSIS — Z950 Presence of cardiac pacemaker: Secondary | ICD-10-CM | POA: Diagnosis not present

## 2013-06-21 DIAGNOSIS — I129 Hypertensive chronic kidney disease with stage 1 through stage 4 chronic kidney disease, or unspecified chronic kidney disease: Secondary | ICD-10-CM | POA: Insufficient documentation

## 2013-06-21 DIAGNOSIS — G459 Transient cerebral ischemic attack, unspecified: Secondary | ICD-10-CM | POA: Diagnosis not present

## 2013-06-21 DIAGNOSIS — N390 Urinary tract infection, site not specified: Secondary | ICD-10-CM | POA: Diagnosis not present

## 2013-06-21 DIAGNOSIS — N289 Disorder of kidney and ureter, unspecified: Secondary | ICD-10-CM

## 2013-06-21 DIAGNOSIS — I420 Dilated cardiomyopathy: Secondary | ICD-10-CM | POA: Diagnosis present

## 2013-06-21 DIAGNOSIS — E785 Hyperlipidemia, unspecified: Secondary | ICD-10-CM | POA: Diagnosis not present

## 2013-06-21 DIAGNOSIS — I5022 Chronic systolic (congestive) heart failure: Secondary | ICD-10-CM | POA: Insufficient documentation

## 2013-06-21 DIAGNOSIS — I4891 Unspecified atrial fibrillation: Secondary | ICD-10-CM | POA: Diagnosis not present

## 2013-06-21 DIAGNOSIS — E1142 Type 2 diabetes mellitus with diabetic polyneuropathy: Secondary | ICD-10-CM | POA: Insufficient documentation

## 2013-06-21 DIAGNOSIS — I509 Heart failure, unspecified: Secondary | ICD-10-CM | POA: Diagnosis not present

## 2013-06-21 DIAGNOSIS — I6529 Occlusion and stenosis of unspecified carotid artery: Secondary | ICD-10-CM | POA: Diagnosis present

## 2013-06-21 DIAGNOSIS — I10 Essential (primary) hypertension: Secondary | ICD-10-CM | POA: Diagnosis present

## 2013-06-21 DIAGNOSIS — N183 Chronic kidney disease, stage 3 unspecified: Secondary | ICD-10-CM | POA: Diagnosis not present

## 2013-06-21 DIAGNOSIS — R4701 Aphasia: Secondary | ICD-10-CM | POA: Diagnosis not present

## 2013-06-21 DIAGNOSIS — I428 Other cardiomyopathies: Secondary | ICD-10-CM | POA: Insufficient documentation

## 2013-06-21 DIAGNOSIS — E782 Mixed hyperlipidemia: Secondary | ICD-10-CM | POA: Diagnosis present

## 2013-06-21 LAB — COMPREHENSIVE METABOLIC PANEL
ALT: 17 U/L (ref 0–35)
CO2: 25 mEq/L (ref 19–32)
Calcium: 9.7 mg/dL (ref 8.4–10.5)
Chloride: 101 mEq/L (ref 96–112)
Creatinine, Ser: 1.35 mg/dL — ABNORMAL HIGH (ref 0.50–1.10)
GFR calc Af Amer: 40 mL/min — ABNORMAL LOW (ref >90)
GFR calc non Af Amer: 35 mL/min — ABNORMAL LOW (ref >90)
Glucose, Bld: 95 mg/dL (ref 70–99)
Total Bilirubin: 0.4 mg/dL (ref 0.3–1.2)

## 2013-06-21 LAB — CBC WITH DIFFERENTIAL/PLATELET
Eosinophils Relative: 2 % (ref 0–5)
HCT: 39.6 % (ref 36.0–46.0)
Hemoglobin: 13.7 g/dL (ref 12.0–15.0)
Lymphocytes Relative: 32 % (ref 12–46)
Lymphs Abs: 3.1 10*3/uL (ref 0.7–4.0)
MCV: 89.2 fL (ref 78.0–100.0)
Monocytes Absolute: 1 10*3/uL (ref 0.1–1.0)
Neutro Abs: 5.4 10*3/uL (ref 1.7–7.7)
RBC: 4.44 MIL/uL (ref 3.87–5.11)
WBC: 9.7 10*3/uL (ref 4.0–10.5)

## 2013-06-21 LAB — POCT I-STAT, CHEM 8
Calcium, Ion: 1.12 mmol/L — ABNORMAL LOW (ref 1.13–1.30)
Chloride: 103 mEq/L (ref 96–112)
Glucose, Bld: 92 mg/dL (ref 70–99)
HCT: 40 % (ref 36.0–46.0)
Hemoglobin: 13.6 g/dL (ref 12.0–15.0)
Potassium: 4.4 mEq/L (ref 3.5–5.1)

## 2013-06-21 LAB — POCT I-STAT TROPONIN I: Troponin i, poc: 0.02 ng/mL (ref 0.00–0.08)

## 2013-06-21 MED ORDER — INSULIN ASPART 100 UNIT/ML ~~LOC~~ SOLN
0.0000 [IU] | Freq: Three times a day (TID) | SUBCUTANEOUS | Status: DC
  Administered 2013-06-23: 1 [IU] via SUBCUTANEOUS

## 2013-06-21 MED ORDER — B COMPLEX-C PO TABS
1.0000 | ORAL_TABLET | Freq: Every day | ORAL | Status: DC
  Administered 2013-06-22 – 2013-06-23 (×2): 1 via ORAL
  Filled 2013-06-21 (×2): qty 1

## 2013-06-21 MED ORDER — ASPIRIN 325 MG PO TABS
325.0000 mg | ORAL_TABLET | Freq: Every day | ORAL | Status: DC
  Administered 2013-06-22 – 2013-06-23 (×2): 325 mg via ORAL
  Filled 2013-06-21 (×2): qty 1

## 2013-06-21 MED ORDER — GABAPENTIN 300 MG PO CAPS
300.0000 mg | ORAL_CAPSULE | Freq: Every day | ORAL | Status: DC
  Administered 2013-06-21 – 2013-06-22 (×2): 300 mg via ORAL
  Filled 2013-06-21 (×3): qty 1

## 2013-06-21 MED ORDER — VITAMIN D3 25 MCG (1000 UT) PO CAPS: 1000.0000 [IU] | ORAL_CAPSULE | Freq: Every day | ORAL | Status: DC

## 2013-06-21 MED ORDER — CARVEDILOL 6.25 MG PO TABS
6.2500 mg | ORAL_TABLET | Freq: Two times a day (BID) | ORAL | Status: DC
  Administered 2013-06-22 – 2013-06-23 (×3): 6.25 mg via ORAL
  Filled 2013-06-21 (×6): qty 1

## 2013-06-21 MED ORDER — MAGNESIUM GLUCONATE 500 MG PO TABS
500.0000 mg | ORAL_TABLET | Freq: Two times a day (BID) | ORAL | Status: DC
  Administered 2013-06-22: 500 mg via ORAL
  Administered 2013-06-22 – 2013-06-23 (×2): via ORAL
  Filled 2013-06-21 (×4): qty 1

## 2013-06-21 MED ORDER — FAMOTIDINE 20 MG PO TABS
20.0000 mg | ORAL_TABLET | Freq: Every day | ORAL | Status: DC
  Administered 2013-06-22 – 2013-06-23 (×2): 20 mg via ORAL
  Filled 2013-06-21 (×2): qty 1

## 2013-06-21 MED ORDER — LOSARTAN POTASSIUM 25 MG PO TABS
25.0000 mg | ORAL_TABLET | Freq: Every day | ORAL | Status: DC
  Administered 2013-06-22 – 2013-06-23 (×2): 25 mg via ORAL
  Filled 2013-06-21 (×2): qty 1

## 2013-06-21 MED ORDER — B COMPLEX VITAMINS PO CAPS: 1.0000 | ORAL_CAPSULE | Freq: Every day | ORAL | Status: DC

## 2013-06-21 MED ORDER — ENOXAPARIN SODIUM 40 MG/0.4ML ~~LOC~~ SOLN
40.0000 mg | SUBCUTANEOUS | Status: DC
  Administered 2013-06-21: 40 mg via SUBCUTANEOUS
  Filled 2013-06-21 (×2): qty 0.4

## 2013-06-21 MED ORDER — INSULIN ASPART 100 UNIT/ML ~~LOC~~ SOLN: 0.0000 [IU] | Freq: Every day | SUBCUTANEOUS | Status: DC

## 2013-06-21 MED ORDER — VITAMIN D3 25 MCG (1000 UNIT) PO TABS
1000.0000 [IU] | ORAL_TABLET | ORAL | Status: DC
  Administered 2013-06-23: 1000 [IU] via ORAL
  Filled 2013-06-21: qty 1

## 2013-06-21 MED ORDER — ACETAMINOPHEN 325 MG PO TABS: 650.0000 mg | ORAL_TABLET | ORAL | Status: DC | PRN

## 2013-06-21 MED ORDER — SODIUM CHLORIDE 0.9 % IV SOLN
INTRAVENOUS | Status: AC
  Administered 2013-06-21: 23:00:00 via INTRAVENOUS

## 2013-06-21 MED ORDER — SIMVASTATIN 20 MG PO TABS
20.0000 mg | ORAL_TABLET | Freq: Every day | ORAL | Status: DC
  Administered 2013-06-22 – 2013-06-23 (×2): 20 mg via ORAL
  Filled 2013-06-21 (×2): qty 1

## 2013-06-21 MED ORDER — VITAMIN D3 25 MCG (1000 UNIT) PO TABS
2000.0000 [IU] | ORAL_TABLET | ORAL | Status: DC
  Administered 2013-06-22: 2000 [IU] via ORAL
  Filled 2013-06-21: qty 2

## 2013-06-21 MED ORDER — EZETIMIBE 10 MG PO TABS
10.0000 mg | ORAL_TABLET | Freq: Every day | ORAL | Status: DC
  Administered 2013-06-22 – 2013-06-23 (×2): 10 mg via ORAL
  Filled 2013-06-21 (×2): qty 1

## 2013-06-21 NOTE — ED Provider Notes (Signed)
History    CSN: [id number] Arrival date & time 06/21/13  1648  First MD Initiated Contact with Patient 06/21/13 1841     No chief complaint on file.  (Consider location/radiation/quality/duration/timing/severity/associated sxs/prior Treatment) HPI Comments: 79 year old female who has a history of atrial fibrillation, biventricular cardiac pacemaker, hypertension and diet controlled diabetes who has a history of occlusion and stenosis of the carotid artery. She presents with a complaint of intermittent expressive aphasia. This occurred yesterday for the first time when she was around family members, it then spontaneously resolved after several minutes and came back today. She says that it lasted much longer today but has now resolved. Her son talked to her on the phone and noted that she had slurred speech at one point. She denies any associated weakness or numbness or difficulty ambulating but states that she has chronic lightheadedness. She has no vertigo, no headache, no chest pain or shortness of breath. Her symptoms have resolved spontaneously and at this time she has normal speech.  The history is provided by the patient and a relative.   Past Medical History  Diagnosis Date  . Secondary cardiomyopathy, unspecified   . Occlusion and stenosis of carotid artery without mention of cerebral infarction   . Hypertension   . GERD (gastroesophageal reflux disease)   . Diabetes mellitus without complication   . A-fib   . Biventricular cardiac pacemaker in situ     05/06/13  Old AICD explanted and 6949 lead capped  RV lead  Medtronic 5076 [id number]  LV lead  Medtronic 4194  [id number] V Medtronic biventricular pacer  PVX [id number]   . Hyperlipidemia   . Peripheral neuropathy   . History of esophageal stricture   . History of duodenal ulcer   . Transient global amnesia    Past Surgical History  Procedure Laterality Date  . Cardiac defibrillator placement  09/2004    MDT ICD implanted for  primary prevention; device upgrade to CRTP in 04-2013  . Cardiac catheterization      04/2003  . Appendectomy    . Abdominal hysterectomy    . Biv pacemaker generator change out  05/05/2013    MDT CRTP upgrade (previously implanted dual chamber ICD) by Dr Ladona Ridgel.  New RV/LV leads placed.   . Carpal tunnel release    . Cholecystectomy     Family History  Problem Relation Age of Onset  . Cancer Maternal Grandmother     breast   History  Substance Use Topics  . Smoking status: Passive Smoke Exposure - Never Smoker  . Smokeless tobacco: Never Used  . Alcohol Use: No   OB History   Grav Para Term Preterm Abortions TAB SAB Ect Mult Living                 Review of Systems  All other systems reviewed and are negative.    Allergies  Review of patient's allergies indicates no known allergies.  Home Medications   Current Outpatient Rx  Name  Route  Sig  Dispense  Refill  . aspirin EC 81 MG tablet   Oral   Take 81 mg by mouth at bedtime.         Marland Kitchen b complex vitamins capsule   Oral   Take 1 capsule by mouth daily.          . bumetanide (BUMEX) 1 MG tablet   Oral   Take 0.5 mg by mouth daily.         Marland Kitchen  carvedilol (COREG) 6.25 MG tablet   Oral   Take 6.25 mg by mouth 2 (two) times daily with a meal.         . Cholecalciferol (VITAMIN D3) 1000 UNITS CAPS   Oral   Take 1,000-2,000 Units by mouth daily. Take 1000mg  on Monday, Wednesday, and Friday. Takes 2000mg  all other days         . ezetimibe (ZETIA) 10 MG tablet   Oral   Take 10 mg by mouth daily. Take 1/4 daily         . gabapentin (NEURONTIN) 300 MG capsule   Oral   Take 300 mg by mouth at bedtime.         Marland Kitchen losartan (COZAAR) 50 MG tablet   Oral   Take 25 mg by mouth daily.          . magnesium gluconate (MAGONATE) 500 MG tablet   Oral   Take 500 mg by mouth 2 (two) times daily.         . pravastatin (PRAVACHOL) 40 MG tablet   Oral   Take 40 mg by mouth daily.         . ranitidine  (ZANTAC) 300 MG tablet   Oral   Take 300 mg by mouth every morning.          BP 132/70  Pulse 66  Temp(Src) 98.3 F (36.8 C) (Oral)  Resp 20  SpO2 98% Physical Exam  Nursing note and vitals reviewed. Constitutional: She appears well-developed and well-nourished. No distress.  HENT:  Head: Normocephalic and atraumatic.  Mouth/Throat: Oropharynx is clear and moist. No oropharyngeal exudate.  Eyes: Conjunctivae and EOM are normal. Pupils are equal, round, and reactive to light. Right eye exhibits no discharge. Left eye exhibits no discharge. No scleral icterus.  Neck: Normal range of motion. Neck supple. No JVD present. No thyromegaly present.  Cardiovascular: Normal rate, regular rhythm, normal heart sounds and intact distal pulses.  Exam reveals no gallop and no friction rub.   No murmur heard. Pulmonary/Chest: Effort normal and breath sounds normal. No respiratory distress. She has no wheezes. She has no rales.  Abdominal: Soft. Bowel sounds are normal. She exhibits no distension and no mass. There is no tenderness.  Musculoskeletal: Normal range of motion. She exhibits no edema and no tenderness.  Lymphadenopathy:    She has no cervical adenopathy.  Neurological: She is alert. Coordination normal.  Speech is clear, cranial nerves III through XII are intact, memory is intact, strength is normal in all 4 extremities including grips, sensation is intact to light touch and pinprick in all 4 extremities. Coordination as tested by finger-nose-finger is normal, no limb ataxia.  Skin: Skin is warm and dry. No rash noted. No erythema.  Psychiatric: She has a normal mood and affect. Her behavior is normal.    ED Course  Procedures (including critical care time) Labs Reviewed  GLUCOSE, CAPILLARY - Abnormal; Notable for the following:    Glucose-Capillary 111 (*)    All other components within normal limits  COMPREHENSIVE METABOLIC PANEL - Abnormal; Notable for the following:     Creatinine, Ser 1.35 (*)    GFR calc non Af Amer 35 (*)    GFR calc Af Amer 40 (*)    All other components within normal limits  POCT I-STAT, CHEM 8 - Abnormal; Notable for the following:    BUN 24 (*)    Creatinine, Ser 1.60 (*)    Calcium, Ion 1.12 (*)  All other components within normal limits  CBC WITH DIFFERENTIAL   Ct Head (brain) Wo Contrast  06/21/2013   *RADIOLOGY REPORT*  Clinical Data: Expressive aphasia for 2 days.  CT HEAD WITHOUT CONTRAST  Technique:  Contiguous axial images were obtained from the base of the skull through the vertex without contrast.  Comparison: 05/01/2010.  Findings:  Calvarium: No acute osseous abnormality.  No lytic or blastic lesion.  Orbits: Bilateral cataract resection.  Brain: No acute infarct is detected.  Similar pattern of patchy bilateral deep and subcortical cerebral white matter low attenuation.  Small remote ischemic insults to the peripheral left cerebellum.  No evidence of acute hemorrhage, hydrocephalus, mass effect/shift.  IMPRESSION: 1.  No acute infarct detected.  2.  Chronic small vessel ischemic changes.   Original Report Authenticated By: Tiburcio Pea   1. TIA (transient ischemic attack)   2. Renal insufficiency     MDM  At this time the patient has a normal cardiac and neurologic exam. She has no carotid bruits, no focal neurologic deficits including her speech which is clear. She will need further workup for evaluation of possible transient ischemic attacks.  ED ECG REPORT  I personally interpreted this EKG   Date: 06/21/2013   Rate: 69  Rhythm: Electronic paced rhythm  QRS Axis: left  Intervals: Paced rhythm  ST/T Wave abnormalities: nonspecific ST/T changes  Conduction Disutrbances:none  Narrative Interpretation:   Old EKG Reviewed: Compared with 05/06/2013, no significant changes are seen  CT negative - labs unremarkable other than for her renal insufficiency = pt has not decompensated nor has she had recurrence of her  aphasia - she will need admission and further w/u.    Vida Roller, MD 06/21/13 2022

## 2013-06-21 NOTE — ED Notes (Signed)
Pt states yesterday she was talking and then could not get words out.  Pt states it happened twice today.  Last time it happened was at 1300.  Pt states she was dizzy and walking unbalanced.  Pt reports dizziness increased and consistent over the last 2 days

## 2013-06-21 NOTE — Progress Notes (Signed)
Attempted to get report from ER nurse. Number given. ER nurse will call to give report

## 2013-06-21 NOTE — H&P (Signed)
Patient's PCP: Nadean Corwin, MD Patient's cardiologist: Dr. Donnie Aho  Chief Complaint: Slurred speech and difficulty speaking  History of Present Illness: [name] is a 79 y.o. Caucasian female with history of nonischemic cardiomyopathy with EF of 40% based on last 2-D echocardiogram on 10/22/2006, hypertension, diabetes not on any medications at home appears to be diet controlled, atrial fibrillation not on anticoagulation, hyperlipidemia, and Bi-V cardiac pacemaker who presents with the above complaints. Patient reports that her symptoms started yesterday afternoon when she had a sudden onset of difficulty speaking.  She is unsure what time she had the onset of difficulty speaking and how long this lasted for, but eventually resolved.  She had 2 episodes this morning with difficulty speaking and slurred speech while talking to the son on the telephone.  Patient is not able to tell me at which time she had these episodes and for how long she had these episodes. However her symptoms resolved by the time her son and daughter came home.  She called Dr. Donnie Aho, her cardiologist, who instructed her to come to the emergency department for further evaluation.  Patient denies any recent fevers, chills, nausea, vomiting, chest pain, shortness of breath, abdominal pain, diarrhea, headaches or vision changes.  Denies any recent numbness/tingling or weakness anywhere in her body.  She has chronic neuropathy in her feet bilaterally which is the only complaint she has at this time.  Hospitalist service was asked to admit the patient for further care and management.    Review of Systems: All systems reviewed with the patient and positive as per history of present illness, otherwise all other systems are negative.  Past Medical History  Diagnosis Date  . Secondary cardiomyopathy, unspecified   . Occlusion and stenosis of carotid artery without mention of cerebral infarction   . Hypertension   . GERD  (gastroesophageal reflux disease)   . Diabetes mellitus without complication   . A-fib   . Biventricular cardiac pacemaker in situ     05/06/13  Old AICD explanted and 6949 lead capped  RV lead  Medtronic 5076 [id number]  LV lead  Medtronic 4194  [id number] V Medtronic biventricular pacer  PVX [id number]   . Hyperlipidemia   . Peripheral neuropathy   . History of esophageal stricture   . History of duodenal ulcer   . Transient global amnesia    Past Surgical History  Procedure Laterality Date  . Cardiac defibrillator placement  09/2004    MDT ICD implanted for primary prevention; device upgrade to CRTP in 04-2013  . Cardiac catheterization      04/2003  . Appendectomy    . Abdominal hysterectomy    . Biv pacemaker generator change out  05/05/2013    MDT CRTP upgrade (previously implanted dual chamber ICD) by Dr Ladona Ridgel.  New RV/LV leads placed.   . Carpal tunnel release    . Cholecystectomy     Family History  Problem Relation Age of Onset  . Cancer Maternal Grandmother     breast  . Dementia Mother   . Heart Problems Father   . Stroke Son   . Stroke Daughter    History   Social History  . Marital Status: Widowed    Spouse Name: N/A    Number of Children: 2  . Years of Education: 12   Occupational History  . Not on file.   Social History Main Topics  . Smoking status: Passive Smoke Exposure - Never Smoker  . Smokeless tobacco: Never Used  .  Alcohol Use: No  . Drug Use: No  . Sexually Active: Not on file   Other Topics Concern  . Not on file   Social History Narrative  . No narrative on file   Allergies: Review of patient's allergies indicates no known allergies.  Home Meds: Prior to Admission medications   Medication Sig Start Date End Date Taking? Authorizing Provider  aspirin EC 81 MG tablet Take 81 mg by mouth at bedtime.   Yes Historical Provider, MD  b complex vitamins capsule Take 1 capsule by mouth daily.    Yes Historical Provider, MD  bumetanide (BUMEX)  1 MG tablet Take 0.5 mg by mouth daily.   Yes Historical Provider, MD  carvedilol (COREG) 6.25 MG tablet Take 6.25 mg by mouth 2 (two) times daily with a meal.   Yes Historical Provider, MD  Cholecalciferol (VITAMIN D3) 1000 UNITS CAPS Take 1,000-2,000 Units by mouth daily. Take 1000mg  on Monday, Wednesday, and Friday. Takes 2000mg  all other days   Yes Historical Provider, MD  ezetimibe (ZETIA) 10 MG tablet Take 10 mg by mouth daily. Take 1/4 daily   Yes Historical Provider, MD  gabapentin (NEURONTIN) 300 MG capsule Take 300 mg by mouth at bedtime.   Yes Historical Provider, MD  losartan (COZAAR) 50 MG tablet Take 25 mg by mouth daily.    Yes Historical Provider, MD  magnesium gluconate (MAGONATE) 500 MG tablet Take 500 mg by mouth 2 (two) times daily.   Yes Historical Provider, MD  pravastatin (PRAVACHOL) 40 MG tablet Take 40 mg by mouth daily.   Yes Historical Provider, MD  ranitidine (ZANTAC) 300 MG tablet Take 300 mg by mouth every morning.   Yes Historical Provider, MD    Physical Exam: Blood pressure 132/70, pulse 66, temperature 98.3 F (36.8 C), temperature source Oral, resp. rate 20, SpO2 98.00%. General: Awake, Oriented x3, No acute distress. HEENT: EOMI, Moist mucous membranes Neck: Supple CV: S1 and S2 Lungs: Clear to ascultation bilaterally Abdomen: Soft, Nontender, Nondistended, +bowel sounds. Ext: Good pulses. Trace edema. No clubbing or cyanosis noted. Neuro: Cranial Nerves II-XII grossly intact. Has 5/5 motor strength in upper and lower extremities.  Lab results:  Recent Labs  06/21/13 1910 06/21/13 2014  NA 138 139  K 4.5 4.4  CL 101 103  CO2 25  --   GLUCOSE 95 92  BUN 22 24*  CREATININE 1.35* 1.60*  CALCIUM 9.7  --     Recent Labs  06/21/13 1910  AST 25  ALT 17  ALKPHOS 56  BILITOT 0.4  PROT 6.6  ALBUMIN 3.7   No results found for this basename: LIPASE, AMYLASE,  in the last 72 hours  Recent Labs  06/21/13 1910 06/21/13 2014  WBC 9.7  --    NEUTROABS 5.4  --   HGB 13.7 13.6  HCT 39.6 40.0  MCV 89.2  --   PLT 244  --    No results found for this basename: CKTOTAL, CKMB, CKMBINDEX, TROPONINI,  in the last 72 hours No components found with this basename: POCBNP,  No results found for this basename: DDIMER,  in the last 72 hours No results found for this basename: HGBA1C,  in the last 72 hours No results found for this basename: CHOL, HDL, LDLCALC, TRIG, CHOLHDL, LDLDIRECT,  in the last 72 hours No results found for this basename: TSH, T4TOTAL, FREET3, T3FREE, THYROIDAB,  in the last 72 hours No results found for this basename: VITAMINB12, FOLATE, FERRITIN, TIBC, IRON, RETICCTPCT,  in the last 72 hours Imaging results:  Ct Head (brain) Wo Contrast  06/21/2013   *RADIOLOGY REPORT*  Clinical Data: Expressive aphasia for 2 days.  CT HEAD WITHOUT CONTRAST  Technique:  Contiguous axial images were obtained from the base of the skull through the vertex without contrast.  Comparison: 05/01/2010.  Findings:  Calvarium: No acute osseous abnormality.  No lytic or blastic lesion.  Orbits: Bilateral cataract resection.  Brain: No acute infarct is detected.  Similar pattern of patchy bilateral deep and subcortical cerebral white matter low attenuation.  Small remote ischemic insults to the peripheral left cerebellum.  No evidence of acute hemorrhage, hydrocephalus, mass effect/shift.  IMPRESSION: 1.  No acute infarct detected.  2.  Chronic small vessel ischemic changes.   Original Report Authenticated By: Tiburcio Pea   Other results: EKG:  paced on monitor .  Assessment & Plan by Problem: Slurred speech and aphasia resolved Etiology unclear.  Head CT negative.  Initiate TIA workup with carotid Dopplers and 2-D echocardiogram.  Patient has a pacemaker hence cannot do an MRI.  Patient will be on aspirin 325 mg daily.  Requests PT and OT consultation in the morning.  If any further episodes, may need neurology evaluation.  Check lipid panel and  hemoglobin A1c.  Gently hydrate the patient on normal saline 50 cc/hr over 12 hours.  Type 2 diabetes with neuropathy Patient is not on any diabetic medications.  Appears to be diet controlled.  Sliding scale insulin, sensitive.  Check hemoglobin A1c.  Continue gabapentin for neuropathy.  Hypertension Stable.  Continue home antihypertensive medications.  Hyperlipidemia Continue statin and Zetia.  Check lipid panel in the morning.  Nonischemic dilated cardiomyopathy status post biventricular cardiac pacemaker Patient's Bumex held for now.  Reassess on status in the morning, to determine if Bumex needs to be restarted.  Monitor on telemetry.  Last available EF was 40%.  GERD Continue famotidine which has been substituted for ranitidine.  Chronic kidney disease stage III Renal function at baseline.  Continue to monitor.  Prophylaxis Lovenox.  CODE STATUS Full code.  This was discussed with the patient at the time of admission.  Disposition Admit the patient to telemetry as observation.  Time spent on admission, talking to the patient, and coordinating care was: 50 mins.  Trentan Trippe A, MD 06/21/2013, 8:52 PM

## 2013-06-22 DIAGNOSIS — R4701 Aphasia: Secondary | ICD-10-CM | POA: Diagnosis not present

## 2013-06-22 DIAGNOSIS — I428 Other cardiomyopathies: Secondary | ICD-10-CM | POA: Diagnosis not present

## 2013-06-22 DIAGNOSIS — G459 Transient cerebral ischemic attack, unspecified: Secondary | ICD-10-CM | POA: Diagnosis not present

## 2013-06-22 DIAGNOSIS — G458 Other transient cerebral ischemic attacks and related syndromes: Secondary | ICD-10-CM | POA: Diagnosis not present

## 2013-06-22 DIAGNOSIS — I5022 Chronic systolic (congestive) heart failure: Secondary | ICD-10-CM | POA: Diagnosis not present

## 2013-06-22 LAB — GLUCOSE, CAPILLARY: Glucose-Capillary: 101 mg/dL — ABNORMAL HIGH (ref 70–99)

## 2013-06-22 LAB — RAPID URINE DRUG SCREEN, HOSP PERFORMED
Amphetamines: NOT DETECTED
Barbiturates: NOT DETECTED
Benzodiazepines: NOT DETECTED

## 2013-06-22 LAB — CBC
MCH: 30.5 pg (ref 26.0–34.0)
MCHC: 33.5 g/dL (ref 30.0–36.0)
Platelets: 219 10*3/uL (ref 150–400)
RDW: 12.9 % (ref 11.5–15.5)

## 2013-06-22 LAB — BASIC METABOLIC PANEL
Calcium: 9 mg/dL (ref 8.4–10.5)
GFR calc Af Amer: 36 mL/min — ABNORMAL LOW (ref >90)
GFR calc non Af Amer: 31 mL/min — ABNORMAL LOW (ref >90)
Glucose, Bld: 90 mg/dL (ref 70–99)
Sodium: 141 mEq/L (ref 135–145)

## 2013-06-22 LAB — HEMOGLOBIN A1C
Hgb A1c MFr Bld: 5.7 % — ABNORMAL HIGH (ref <5.7)
Mean Plasma Glucose: 117 mg/dL — ABNORMAL HIGH (ref <117)

## 2013-06-22 LAB — URINE MICROSCOPIC-ADD ON

## 2013-06-22 LAB — URINALYSIS, ROUTINE W REFLEX MICROSCOPIC
Bilirubin Urine: NEGATIVE
Hgb urine dipstick: NEGATIVE
Protein, ur: NEGATIVE mg/dL
Urobilinogen, UA: 0.2 mg/dL (ref 0.0–1.0)

## 2013-06-22 LAB — LIPID PANEL
Cholesterol: 180 mg/dL (ref 0–200)
Total CHOL/HDL Ratio: 3.2 RATIO
VLDL: 40 mg/dL (ref 0–40)

## 2013-06-22 LAB — VITAMIN B12: Vitamin B-12: 657 pg/mL (ref 211–911)

## 2013-06-22 MED ORDER — ENOXAPARIN SODIUM 30 MG/0.3ML ~~LOC~~ SOLN
30.0000 mg | SUBCUTANEOUS | Status: DC
  Administered 2013-06-22: 30 mg via SUBCUTANEOUS
  Filled 2013-06-22 (×2): qty 0.3

## 2013-06-22 MED ORDER — ZOLPIDEM TARTRATE 5 MG PO TABS: 5.0000 mg | ORAL_TABLET | Freq: Every evening | ORAL | Status: DC | PRN

## 2013-06-22 NOTE — Progress Notes (Signed)
*  PRELIMINARY RESULTS* Echocardiogram 2D Echocardiogram has been performed.  [name] 06/22/2013, 12:10 PM

## 2013-06-22 NOTE — Evaluation (Signed)
Physical Therapy Evaluation Patient Details Name: [name] MRN: [id number] DOB: 01-10-1925 Today's Date: 06/22/2013 Time: 6962-9528 PT Time Calculation (min): 30 min  PT Assessment / Plan / Recommendation History of Present Illness  79 y.o. female with hx of hypertension, DM, atrial fibrillation not on anticoagulation, hyperlipidemia, and Bi-V cardiac pacemaker who presents with sudden onset of difficulty speaking. She is unsure what time she had the onset of difficulty speaking and how long this lasted for, but eventually resolved. She had 2 episodes this morning with difficulty speaking and slurred speech while talking to the son on the telephone.Son called Dr. Donnie Aho, her cardiologist, who instructed her to come to the emergency department for further evaluation. Ct scan negative and pacemaker prevents MRI  Clinical Impression  Presents to PT today without c/o weakness, denies any strength deficits and reports her speech has returned to baseline. She is a very active and independent lady who enjoys gardening and cooking. Today she did demonstrate higher level balance impairments with DGI 17/24. Will benefit physical therapy in the acute setting to maximize safety for d/c home and determine if full balance w/u needed as an outpatient.     PT Assessment  Patient needs continued PT services    Follow Up Recommendations  Outpatient PT(balance)    Does the patient have the potential to tolerate intense rehabilitation      Barriers to Discharge        Equipment Recommendations  None recommended by PT    Recommendations for Other Services     Frequency Min 3X/week    Precautions / Restrictions     Pertinent Vitals/Pain Denies pain      Mobility  Bed Mobility Bed Mobility: Supine to Sit Supine to Sit: 7: Independent Transfers Transfers: Sit to Stand;Stand to Sit Sit to Stand: 5: Supervision;With upper extremity assist;From bed Stand to Sit: 6: Modified independent  (Device/Increase time);With upper extremity assist;To chair/3-in-1;To bed;With armrests Details for Transfer Assistance: initial attempt to stand unsuccessful needing to sit back down quickly, stood with definite need for upper extremities durign stand however successful next attempt with supervision Ambulation/Gait Ambulation/Gait Assistance: 5: Supervision Ambulation Distance (Feet): 500 Feet Assistive device: None Gait Pattern: Step-through pattern Gait velocity: decreased General Gait Details: slight increase in lateral sway and decreased speed from baseline, appears cautious Stairs: Yes Stairs Assistance: 6: Modified independent (Device/Increase time) Stair Management Technique: One rail Left;Alternating pattern Number of Stairs: 12 Modified Rankin (Stroke Patients Only) Pre-Morbid Rankin Score: No symptoms Modified Rankin: No symptoms    Exercises     PT Diagnosis: Abnormality of gait  PT Problem List: Decreased balance PT Treatment Interventions: Gait training;Balance training;Therapeutic exercise;Therapeutic activities;Neuromuscular re-education;Patient/family education     PT Goals(Current goals can be found in the care plan section) Acute Rehab PT Goals Patient Stated Goal: home PT Goal Formulation: With patient Time For Goal Achievement: 06/29/13 Potential to Achieve Goals: Good  Visit Information  Last PT Received On: 06/22/13 Assistance Needed: +1 History of Present Illness: 79 y.o. female with hx of hypertension, DM, atrial fibrillation not on anticoagulation, hyperlipidemia, and Bi-V cardiac pacemaker who presents with sudden onset of difficulty speaking. She is unsure what time she had the onset of difficulty speaking and how long this lasted for, but eventually resolved. She had 2 episodes this morning with difficulty speaking and slurred speech while talking to the son on the telephone.Son called Dr. Donnie Aho, her cardiologist, who instructed her to come to the  emergency department for further evaluation. Ct  scan negative and pacemaker prevents MRI       Prior Functioning  Home Living Family/patient expects to be discharged to:: Private residence Living Arrangements: Children Available Help at Discharge: Family;Available PRN/intermittently Type of Home: House Home Access: Stairs to enter [employer] of Steps: 3 Entrance Stairs-Rails: None Home Layout: Two level;Laundry or work area in Artist of Steps: goes down there a lot, 1 flight of steps Alternate Level Stairs-Rails: Right Home Equipment: None Prior Function Level of Independence: Independent Comments: drives occasionally, lives with daughter who works during the day, very active in her garden Musician: No difficulties    Cognition  Cognition Arousal/Alertness: Awake/alert Behavior During Therapy: WFL for tasks assessed/performed Overall Cognitive Status: Within Functional Limits for tasks assessed    Extremity/Trunk Assessment Upper Extremity Assessment Upper Extremity Assessment: Overall WFL for tasks assessed Lower Extremity Assessment Lower Extremity Assessment: Overall WFL for tasks assessed Cervical / Trunk Assessment Cervical / Trunk Assessment: Normal   Balance Balance Balance Assessed: Yes Static Standing Balance Rhomberg - Eyes Opened: 60 (needs mingaurdA) Rhomberg - Eyes Closed: 30 (needs mingaurdA) Standardized Balance Assessment Standardized Balance Assessment: Dynamic Gait Index Dynamic Gait Index Level Surface: Mild Impairment Change in Gait Speed: Normal Gait with Horizontal Head Turns: Moderate Impairment Gait with Vertical Head Turns: Mild Impairment Gait and Pivot Turn: Mild Impairment Step Over Obstacle: Normal Step Around Obstacles: Mild Impairment Steps: Mild Impairment Total Score: 17  End of Session PT - End of Session Equipment Utilized During Treatment: Gait belt Activity  Tolerance: Patient tolerated treatment well Patient left: in chair;with call bell/phone within reach Nurse Communication: Mobility status  GP Functional Limitation: Mobility: Walking and moving around Mobility: Walking and Moving Around Current Status ([id number]): At least 1 percent but less than 20 percent impaired, limited or restricted Mobility: Walking and Moving Around Goal Status [phone]): 0 percent impaired, limited or restricted   [facility] HELEN 06/22/2013, 12:08 PM

## 2013-06-22 NOTE — Progress Notes (Signed)
Occupational Therapy Evaluation and Discharge Patient Details Name: [name] MRN: [id number] DOB: August 20, 1925 Today's Date: 06/22/2013 Time: 1478-2956 OT Time Calculation (min): 15 min  OT Assessment / Plan / Recommendation History of present illness 79 y.o. female with hx of hypertension, DM, atrial fibrillation not on anticoagulation, hyperlipidemia, and Bi-V cardiac pacemaker who presents with sudden onset of difficulty speaking. She is unsure what time she had the onset of difficulty speaking and how long this lasted for, but eventually resolved. She had 2 episodes this morning with difficulty speaking and slurred speech while talking to the son on the telephone.Son called Dr. Donnie Aho, her cardiologist, who instructed her to come to the emergency department for further evaluation. Ct scan negative and pacemaker prevents MRI   Clinical Impression   Pt is a pleasant 79 y o female who PTA was independent. Pt able to perform tub transfer and BUE grooming tasks standing unsupported at sink at modified independent. Per daughter and son, she is back at baseline. Pt still having some word finding problems, OTS had trouble understanding parts of her speech. Pt is not in need of any further OT services at this time.     OT Assessment  Patient does not need any further OT services    Follow Up Recommendations  No OT follow up       Equipment Recommendations  None recommended by OT          Precautions / Restrictions Restrictions Weight Bearing Restrictions: No   Pertinent Vitals/Pain Pt did not report any pain during the session    ADL  Eating/Feeding: Independent Where Assessed - Eating/Feeding: Chair Grooming: Wash/dry hands;Supervision/safety Where Assessed - Grooming: Unsupported standing Upper Body Bathing: Modified independent Where Assessed - Upper Body Bathing: Unsupported sitting Lower Body Bathing: Supervision/safety Where Assessed - Lower Body Bathing: Unsupported  sitting Upper Body Dressing: Modified independent Where Assessed - Upper Body Dressing: Unsupported sitting Lower Body Dressing: Supervision/safety Where Assessed - Lower Body Dressing: Unsupported sit to stand Toilet Transfer: Modified independent Toilet Transfer Method: Sit to Barista: Comfort height toilet Toileting - Clothing Manipulation and Hygiene: Independent Where Assessed - Engineer, mining and Hygiene: Standing Tub/Shower Transfer: Modified independent Tub/Shower Transfer Method: Ambulating Equipment Used: Gait belt Transfers/Ambulation Related to ADLs: Pt modified independent for transfers and ambulation (required incr time) ADL Comments: Pt able to complete tub transfer, hold eyes closed and balance for 15 seconds. Pt able to problem solve in the bathroom and perform BUE grooming tasks at the sink.      OT Goals(Current goals can be found in the care plan section) Acute Rehab OT Goals Patient Stated Goal: home  Visit Information  Last OT Received On: 06/22/13 Assistance Needed: +1 History of Present Illness: 79 y.o. female with hx of hypertension, DM, atrial fibrillation not on anticoagulation, hyperlipidemia, and Bi-V cardiac pacemaker who presents with sudden onset of difficulty speaking. She is unsure what time she had the onset of difficulty speaking and how long this lasted for, but eventually resolved. She had 2 episodes this morning with difficulty speaking and slurred speech while talking to the son on the telephone.Son called Dr. Donnie Aho, her cardiologist, who instructed her to come to the emergency department for further evaluation. Ct scan negative and pacemaker prevents MRI       Prior Functioning     Home Living Family/patient expects to be discharged to:: Private residence Living Arrangements: Children (Daughter) Available Help at Discharge: Family;Available PRN/intermittently Type of Home: House  Home Access: Stairs  to enter Entrance Stairs-Number of Steps: 3 Entrance Stairs-Rails: None Home Layout: Two level;Laundry or work area in Artist of Steps: goes down there a lot, 1 flight of steps Alternate Level Stairs-Rails: Right Home Equipment: None Prior Function Level of Independence: Independent Comments: drives occasionally, lives with daughter who works during the day, very active in her garden Communication Communication: Expressive difficulties;Other (comment) (word finding ) Dominant Hand: Right         Vision/Perception Vision - History Baseline Vision: Wears glasses all the time Patient Visual Report: No change from baseline Vision - Assessment Eye Alignment: Within Functional Limits Vision Assessment: Vision tested Ocular Range of Motion: Within Functional Limits Alignment/Gaze Preference: Within Defined Limits Tracking/Visual Pursuits: Able to track stimulus in all quads without difficulty Saccades: Within functional limits Visual Fields: No apparent deficits Additional Comments: Pt reports no diploplia or blurriness, WFL for testing during functional tasks as well.   Cognition  Cognition Arousal/Alertness: Awake/alert Behavior During Therapy: WFL for tasks assessed/performed Overall Cognitive Status: Within Functional Limits for tasks assessed    Extremity/Trunk Assessment Upper Extremity Assessment Upper Extremity Assessment: Overall WFL for tasks assessed Lower Extremity Assessment Lower Extremity Assessment: Overall WFL for tasks assessed Cervical / Trunk Assessment Cervical / Trunk Assessment: Normal     Mobility Bed Mobility Bed Mobility: Not assessed Supine to Sit: 7: Independent Transfers Transfers: Sit to Stand;Stand to Sit Sit to Stand: 6: Modified independent (Device/Increase time);With upper extremity assist;With armrests;From chair/3-in-1 Stand to Sit: 6: Modified independent (Device/Increase time);With upper extremity  assist;With armrests;To chair/3-in-1 Details for Transfer Assistance: Pt modifed independent for transfers with BUE assist necessary        Balance Balance Balance Assessed: Yes Static Standing Balance Rhomberg - Eyes Opened: 60 (needs mingaurdA) Rhomberg - Eyes Closed: 30 (needs mingaurdA) Standardized Balance Assessment Standardized Balance Assessment: Dynamic Gait Index Dynamic Gait Index Level Surface: Mild Impairment Change in Gait Speed: Normal Gait with Horizontal Head Turns: Moderate Impairment Gait with Vertical Head Turns: Mild Impairment Gait and Pivot Turn: Mild Impairment Step Over Obstacle: Normal Step Around Obstacles: Mild Impairment Steps: Mild Impairment Total Score: 17   End of Session OT - End of Session Equipment Utilized During Treatment: Gait belt Activity Tolerance: Patient tolerated treatment well Patient left: in chair;with call bell/phone within reach;with family/visitor present Nurse Communication: Mobility status  GO     Sherryl Manges 06/22/2013, 12:28 PM

## 2013-06-22 NOTE — Progress Notes (Signed)
[facility] PROGRESS NOTE  [name] ZOX:[id number] DOB: 11-05-1925 DOA: 06/21/2013 PCP: Nadean Corwin, MD  Assessment/Plan: Slurred speech and aphasia resolved  -Head CT negative.  -Initiate TIA workup with carotid Dopplers and 2-D echocardiogram. -Patient has a pacemaker hence cannot do an MRI.  -Patient will be on aspirin 325 mg daily.  -PT and OT consultation -If any further episodes, may need neurology evaluation. -Gentle hydration for now -Will repeat noncontrast head CT in 24hrs to ensure no change Type 2 diabetes with neuropathy  -Patient is not on any diabetic medications.  -Appears to be diet controlled.  -Sliding scale insulin -Continue gabapentin for neuropathy.  Hypertension  -Stable. Continue home antihypertensive medications.  Hyperlipidemia  -Continue statin and Zetia.  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  -Monitor on telemetry. Last available EF was 40%.  GERD  -Continue famotidine which has been substituted for ranitidine.  Chronic kidney disease stage III  -Renal function at baseline. Continue to monitor.  Prophylaxis  -Lovenox.  Code Status: Full Family Communication: Pt in room (indicate person spoken with, relationship, and if by phone, the number) Disposition Plan: Pending  Procedures:  2d echo pending  Carotid dopplers pending  HPI/Subjective: No complaints  Objective: Filed Vitals:   06/21/13 2345 06/22/13 0407 06/22/13 0606 06/22/13 0743  BP: 130/54 135/58 135/53 145/53  Pulse: 63 72 68 69  Temp: 98.1 F (36.7 C) 97.9 F (36.6 C) 97.8 F (36.6 C) 98.3 F (36.8 C)  TempSrc: Oral Oral Oral Oral  Resp: 16 16 18 20   Height:      Weight:      SpO2: 96% 98% 97% 100%    Intake/Output Summary (Last 24 hours) at 06/22/13 1049 Last data filed at 06/22/13 0757  Gross per 24 hour  Intake    480 ml  Output    300 ml  Net    180 ml   Filed  Weights   06/21/13 2136 06/21/13 2200  Weight: 66.18 kg (145 lb 14.4 oz) 67.042 kg (147 lb 12.8 oz)    Exam:   General:  Awake, in nad  Cardiovascular: regular, s1, s2  Respiratory: normal resp effort   Abdomen: soft, nontender  Musculoskeletal: perfused, no clubbing   Data Reviewed: Basic Metabolic Panel:  Recent Labs Lab 06/21/13 1910 06/21/13 2014 06/22/13 0545  NA 138 139 141  K 4.5 4.4 4.3  CL 101 103 104  CO2 25  --  24  GLUCOSE 95 92 90  BUN 22 24* 21  CREATININE 1.35* 1.60* 1.49*  CALCIUM 9.7  --  9.0   Liver Function Tests:  Recent Labs Lab 06/21/13 1910  AST 25  ALT 17  ALKPHOS 56  BILITOT 0.4  PROT 6.6  ALBUMIN 3.7   No results found for this basename: LIPASE, AMYLASE,  in the last 168 hours No results found for this basename: AMMONIA,  in the last 168 hours CBC:  Recent Labs Lab 06/21/13 1910 06/21/13 2014 06/22/13 0545  WBC 9.7  --  6.9  NEUTROABS 5.4  --   --   HGB 13.7 13.6 13.4  HCT 39.6 40.0 40.0  MCV 89.2  --  90.9  PLT 244  --  219   Cardiac Enzymes: No results found for this basename: CKTOTAL, CKMB, CKMBINDEX, TROPONINI,  in the last 168 hours BNP (last 3 results) No results found for this basename: PROBNP,  in the last 8760 hours CBG:  Recent Labs Lab  06/21/13 1715 06/21/13 2348 06/22/13 0800  GLUCAP 111* 166* 116*    No results found for this or any previous visit (from the past 240 hour(s)).   Studies: Ct Head (brain) Wo Contrast  06/21/2013   *RADIOLOGY REPORT*  Clinical Data: Expressive aphasia for 2 days.  CT HEAD WITHOUT CONTRAST  Technique:  Contiguous axial images were obtained from the base of the skull through the vertex without contrast.  Comparison: 05/01/2010.  Findings:  Calvarium: No acute osseous abnormality.  No lytic or blastic lesion.  Orbits: Bilateral cataract resection.  Brain: No acute infarct is detected.  Similar pattern of patchy bilateral deep and subcortical cerebral white matter low  attenuation.  Small remote ischemic insults to the peripheral left cerebellum.  No evidence of acute hemorrhage, hydrocephalus, mass effect/shift.  IMPRESSION: 1.  No acute infarct detected.  2.  Chronic small vessel ischemic changes.   Original Report Authenticated By: Tiburcio Pea    Scheduled Meds: . aspirin  325 mg Oral Daily  . B-complex with vitamin C  1 tablet Oral Daily  . carvedilol  6.25 mg Oral BID WC  . [START ON 06/23/2013] cholecalciferol  1,000 Units Oral Q M,W,F  . cholecalciferol  2,000 Units Oral Custom  . enoxaparin (LOVENOX) injection  30 mg Subcutaneous Q24H  . ezetimibe  10 mg Oral Daily  . famotidine  20 mg Oral Daily  . gabapentin  300 mg Oral QHS  . insulin aspart  0-5 Units Subcutaneous QHS  . insulin aspart  0-9 Units Subcutaneous TID WC  . losartan  25 mg Oral Daily  . magnesium gluconate  500 mg Oral BID  . simvastatin  20 mg Oral q1800   Continuous Infusions:   Principal Problem:   Slurred speech Active Problems:   Dilated cardiomyopathy, nonischemic   Chronic systolic heart failure   Biventricular cardiac pacemaker in situ   Hyperlipidemia   Hypertension   Aphasia   Diabetes mellitus with neuropathy   CKD (chronic kidney disease), stage III  Time spent:  Deserea Bordley K  [facility] Pager [phone]. If 7PM-7AM, please contact night-coverage at [email or web address], password [facility] 06/22/2013, 10:49 AM  LOS: 1 day

## 2013-06-22 NOTE — Progress Notes (Signed)
I agree with the following treatment note after reviewing documentation.   Johnston, Tell Rozelle Brynn   OTR/L Pager: [phone] Office: [phone] .   

## 2013-06-23 ENCOUNTER — Observation Stay (HOSPITAL_COMMUNITY): Payer: Medicare Other

## 2013-06-23 DIAGNOSIS — R471 Dysarthria and anarthria: Secondary | ICD-10-CM | POA: Diagnosis not present

## 2013-06-23 DIAGNOSIS — N390 Urinary tract infection, site not specified: Secondary | ICD-10-CM | POA: Diagnosis not present

## 2013-06-23 DIAGNOSIS — G458 Other transient cerebral ischemic attacks and related syndromes: Secondary | ICD-10-CM | POA: Diagnosis not present

## 2013-06-23 DIAGNOSIS — I10 Essential (primary) hypertension: Secondary | ICD-10-CM | POA: Diagnosis not present

## 2013-06-23 DIAGNOSIS — G459 Transient cerebral ischemic attack, unspecified: Secondary | ICD-10-CM | POA: Diagnosis not present

## 2013-06-23 DIAGNOSIS — I5022 Chronic systolic (congestive) heart failure: Secondary | ICD-10-CM

## 2013-06-23 LAB — URINE CULTURE

## 2013-06-23 LAB — GLUCOSE, CAPILLARY
Glucose-Capillary: 108 mg/dL — ABNORMAL HIGH (ref 70–99)
Glucose-Capillary: 125 mg/dL — ABNORMAL HIGH (ref 70–99)
Glucose-Capillary: 88 mg/dL (ref 70–99)

## 2013-06-23 MED ORDER — CEFUROXIME AXETIL 250 MG PO TABS: 250.0000 mg | ORAL_TABLET | Freq: Two times a day (BID) | ORAL | Status: DC

## 2013-06-23 MED ORDER — SODIUM CHLORIDE 0.9 % IV SOLN
INTRAVENOUS | Status: DC
  Administered 2013-06-23: 08:00:00 via INTRAVENOUS

## 2013-06-23 MED ORDER — DEXTROSE 5 % IV SOLN
1.0000 g | INTRAVENOUS | Status: DC
  Administered 2013-06-23: 1 g via INTRAVENOUS
  Filled 2013-06-23: qty 10

## 2013-06-23 MED ORDER — CLOPIDOGREL BISULFATE 75 MG PO TABS: 75.0000 mg | ORAL_TABLET | Freq: Every day | ORAL | Status: DC

## 2013-06-23 NOTE — Consult Note (Signed)
Referring Physician: Janee Morn    Chief Complaint: Transient expressive difficulties  HPI:                                                                                                                                         [name] is an 79 y.o. female who is very hard of hearing and followed by cardiology for Afib and currently is paced. She has known EF 30%.  Patient is currently on ASA. Per family, patient had a 3 hour period where she was having difficulty expressing herself.  Per son, this was intermittent and has fully resolved.  Patient recalls event and states "I knew what I wanted to say but I could not". Per note, she had one episode while in hospital.  Currently patient is having no symptoms.  Carotid dopplers showed no significant ICA stenosis, A1c 5.7, LDL 84 and CT head showed no acute infarct.  Of note: patient was positive for UTI on admission which she is currently being treated  Date last known well: 7.14.14 Time last known well: Unable to determine tPA Given: No: out of window   Past Medical History  Diagnosis Date  . Secondary cardiomyopathy, unspecified   . Occlusion and stenosis of carotid artery without mention of cerebral infarction   . Hypertension   . GERD (gastroesophageal reflux disease)   . Diabetes mellitus without complication   . A-fib   . Biventricular cardiac pacemaker in situ     05/06/13  Old AICD explanted and 6949 lead capped  RV lead  Medtronic 5076 [id number]  LV lead  Medtronic 4194  [id number] V Medtronic biventricular pacer  PVX [id number]   . Hyperlipidemia   . Peripheral neuropathy   . History of esophageal stricture   . History of duodenal ulcer   . Transient global amnesia     Past Surgical History  Procedure Laterality Date  . Cardiac defibrillator placement  09/2004    MDT ICD implanted for primary prevention; device upgrade to CRTP in 04-2013  . Cardiac catheterization      04/2003  . Appendectomy    . Abdominal hysterectomy     . Biv pacemaker generator change out  05/05/2013    MDT CRTP upgrade (previously implanted dual chamber ICD) by Dr Ladona Ridgel.  New RV/LV leads placed.   . Carpal tunnel release    . Cholecystectomy      Family History  Problem Relation Age of Onset  . Cancer Maternal Grandmother     breast  . Dementia Mother   . Heart Problems Father   . Stroke Son   . Stroke Daughter    Social History:  reports that she has been passively smoking.  She has never used smokeless tobacco. She reports that she does not drink alcohol or use illicit drugs.  Allergies: No Known Allergies  Medications:  Scheduled: . aspirin  325 mg Oral Daily  . B-complex with vitamin C  1 tablet Oral Daily  . carvedilol  6.25 mg Oral BID WC  . cholecalciferol  1,000 Units Oral Q M,W,F  . cholecalciferol  2,000 Units Oral Custom  . enoxaparin (LOVENOX) injection  30 mg Subcutaneous Q24H  . ezetimibe  10 mg Oral Daily  . famotidine  20 mg Oral Daily  . gabapentin  300 mg Oral QHS  . insulin aspart  0-5 Units Subcutaneous QHS  . insulin aspart  0-9 Units Subcutaneous TID WC  . losartan  25 mg Oral Daily  . magnesium gluconate  500 mg Oral BID  . simvastatin  20 mg Oral q1800   Continuous: . sodium chloride 75 mL/hr at 06/23/13 0753    ROS:                                                                                                                                       History obtained from the patient  General ROS: negative for - chills, fatigue, fever, night sweats, weight gain or weight loss Psychological ROS: negative for - behavioral disorder, hallucinations, memory difficulties, mood swings or suicidal ideation Ophthalmic ROS: negative for - blurry vision, double vision, eye pain or loss of vision ENT ROS: negative for - epistaxis, nasal discharge, oral lesions, sore throat, tinnitus or  vertigo Allergy and Immunology ROS: negative for - hives or itchy/watery eyes Hematological and Lymphatic ROS: negative for - bleeding problems, bruising or swollen lymph nodes Endocrine ROS: negative for - galactorrhea, hair pattern changes, polydipsia/polyuria or temperature intolerance Respiratory ROS: negative for - cough, hemoptysis, shortness of breath or wheezing Cardiovascular ROS: negative for - chest pain, dyspnea on exertion, edema or irregular heartbeat Gastrointestinal ROS: negative for - abdominal pain, diarrhea, hematemesis, nausea/vomiting or stool incontinence Genito-Urinary ROS: negative for - dysuria, hematuria, incontinence or urinary frequency/urgency Musculoskeletal ROS: negative for - joint swelling or muscular weakness Neurological ROS: as noted in HPI Dermatological ROS: negative for rash and skin lesion changes  Neurologic Examination:                                                                                                      Blood pressure 131/51, pulse 68, temperature 97.6 F (36.4 C), temperature source Oral, resp. rate 18, height 5\' 3"  (1.6 m), weight 67.042 kg (147 lb 12.8 oz), SpO2 98.00%.   Mental Status: Alert, oriented, thought content appropriate.  Speech fluent without evidence  of aphasia.  Able to follow 3 step commands without difficulty. Cranial Nerves: II: Discs flat bilaterally; Visual fields grossly normal, pupils equal, round, reactive to light and accommodation III,IV, VI: ptosis not present, extra-ocular motions intact bilaterally V,VII: smile symmetric, facial light touch sensation normal bilaterally VIII: hearing severely decreased in the left ear and moderately decreased in the right.  IX,X: gag reflex present XI: bilateral shoulder shrug XII: midline tongue extension Motor: Right : Upper extremity   5/5    Left:     Upper extremity   5/5  Lower extremity   5/5     Lower extremity   5/5 --drift noted in right arm Tone and  bulk:normal tone throughout; no atrophy noted Sensory: Pinprick and light touch intact throughout, bilaterally Deep Tendon Reflexes:  Right: Upper Extremity   Left: Upper extremity   biceps (C-5 to C-6) 2/4   biceps (C-5 to C-6) 2/4 tricep (C7) 2/4    triceps (C7) 2/4 Brachioradialis (C6) 2/4  Brachioradialis (C6) 2/4  Lower Extremity Lower Extremity  quadriceps (L-2 to L-4) 1/4   quadriceps (L-2 to L-4) 1/4 Achilles (S1) 0/4   Achilles (S1) 0/4  Plantars: Mute bilaterally Cerebellar: normal finger-to-nose,  normal heel-to-shin test CV: pulses palpable throughout    Results for orders placed during the hospital encounter of 06/21/13 (from the past 48 hour(s))  GLUCOSE, CAPILLARY     Status: Abnormal   Collection Time    06/21/13  5:15 PM      Result Value Range   Glucose-Capillary 111 (*) 70 - 99 mg/dL   Comment 1 Notify RN    CBC WITH DIFFERENTIAL     Status: None   Collection Time    06/21/13  7:10 PM      Result Value Range   WBC 9.7  4.0 - 10.5 K/uL   RBC 4.44  3.87 - 5.11 MIL/uL   Hemoglobin 13.7  12.0 - 15.0 g/dL   HCT 16.1  09.6 - 04.5 %   MCV 89.2  78.0 - 100.0 fL   MCH 30.9  26.0 - 34.0 pg   MCHC 34.6  30.0 - 36.0 g/dL   RDW 40.9  81.1 - 91.4 %   Platelets 244  150 - 400 K/uL   Neutrophils Relative % 56  43 - 77 %   Neutro Abs 5.4  1.7 - 7.7 K/uL   Lymphocytes Relative 32  12 - 46 %   Lymphs Abs 3.1  0.7 - 4.0 K/uL   Monocytes Relative 10  3 - 12 %   Monocytes Absolute 1.0  0.1 - 1.0 K/uL   Eosinophils Relative 2  0 - 5 %   Eosinophils Absolute 0.2  0.0 - 0.7 K/uL   Basophils Relative 0  0 - 1 %   Basophils Absolute 0.0  0.0 - 0.1 K/uL  COMPREHENSIVE METABOLIC PANEL     Status: Abnormal   Collection Time    06/21/13  7:10 PM      Result Value Range   Sodium 138  135 - 145 mEq/L   Potassium 4.5  3.5 - 5.1 mEq/L   Chloride 101  96 - 112 mEq/L   CO2 25  19 - 32 mEq/L   Glucose, Bld 95  70 - 99 mg/dL   BUN 22  6 - 23 mg/dL   Creatinine, Ser 7.82 (*)  0.50 - 1.10 mg/dL   Calcium 9.7  8.4 - 95.6 mg/dL   Total Protein 6.6  6.0 -  8.3 g/dL   Albumin 3.7  3.5 - 5.2 g/dL   AST 25  0 - 37 U/L   ALT 17  0 - 35 U/L   Alkaline Phosphatase 56  39 - 117 U/L   Total Bilirubin 0.4  0.3 - 1.2 mg/dL   GFR calc non Af Amer 35 (*) >90 mL/min   GFR calc Af Amer 40 (*) >90 mL/min   Comment:            The eGFR has been calculated     using the CKD EPI equation.     This calculation has not been     validated in all clinical     situations.     eGFR's persistently     <90 mL/min signify     possible Chronic Kidney Disease.  POCT I-STAT, CHEM 8     Status: Abnormal   Collection Time    06/21/13  8:14 PM      Result Value Range   Sodium 139  135 - 145 mEq/L   Potassium 4.4  3.5 - 5.1 mEq/L   Chloride 103  96 - 112 mEq/L   BUN 24 (*) 6 - 23 mg/dL   Creatinine, Ser 1.61 (*) 0.50 - 1.10 mg/dL   Glucose, Bld 92  70 - 99 mg/dL   Calcium, Ion 0.96 (*) 1.13 - 1.30 mmol/L   TCO2 27  0 - 100 mmol/L   Hemoglobin 13.6  12.0 - 15.0 g/dL   HCT 04.5  40.9 - 81.1 %  POCT I-STAT TROPONIN I     Status: None   Collection Time    06/21/13  8:26 PM      Result Value Range   Troponin i, poc 0.02  0.00 - 0.08 ng/mL   Comment 3            Comment: Due to the release kinetics of cTnI,     a negative result within the first hours     of the onset of symptoms does not rule out     myocardial infarction with certainty.     If myocardial infarction is still suspected,     repeat the test at appropriate intervals.  GLUCOSE, CAPILLARY     Status: Abnormal   Collection Time    06/21/13 11:48 PM      Result Value Range   Glucose-Capillary 166 (*) 70 - 99 mg/dL   Comment 1 Notify RN     Comment 2 Documented in Chart    VITAMIN B12     Status: None   Collection Time    06/22/13  5:45 AM      Result Value Range   Vitamin B-12 657  211 - 911 pg/mL  FOLATE     Status: None   Collection Time    06/22/13  5:45 AM      Result Value Range   Folate >20.0      Comment: (NOTE)     Reference Ranges            Deficient:       0.4 - 3.3 ng/mL            Indeterminate:   3.4 - 5.4 ng/mL            Normal:              > 5.4 ng/mL  CBC     Status: None   Collection Time    06/22/13  5:45 AM      Result Value Range   WBC 6.9  4.0 - 10.5 K/uL   RBC 4.40  3.87 - 5.11 MIL/uL   Hemoglobin 13.4  12.0 - 15.0 g/dL   HCT 78.2  95.6 - 21.3 %   MCV 90.9  78.0 - 100.0 fL   MCH 30.5  26.0 - 34.0 pg   MCHC 33.5  30.0 - 36.0 g/dL   RDW 08.6  57.8 - 46.9 %   Platelets 219  150 - 400 K/uL  BASIC METABOLIC PANEL     Status: Abnormal   Collection Time    06/22/13  5:45 AM      Result Value Range   Sodium 141  135 - 145 mEq/L   Potassium 4.3  3.5 - 5.1 mEq/L   Chloride 104  96 - 112 mEq/L   CO2 24  19 - 32 mEq/L   Glucose, Bld 90  70 - 99 mg/dL   BUN 21  6 - 23 mg/dL   Creatinine, Ser 6.29 (*) 0.50 - 1.10 mg/dL   Calcium 9.0  8.4 - 52.8 mg/dL   GFR calc non Af Amer 31 (*) >90 mL/min   GFR calc Af Amer 36 (*) >90 mL/min   Comment:            The eGFR has been calculated     using the CKD EPI equation.     This calculation has not been     validated in all clinical     situations.     eGFR's persistently     <90 mL/min signify     possible Chronic Kidney Disease.  HEMOGLOBIN A1C     Status: Abnormal   Collection Time    06/22/13  5:45 AM      Result Value Range   Hemoglobin A1C 5.7 (*) <5.7 %   Comment: (NOTE)                                                                               According to the ADA Clinical Practice Recommendations for 2011, when     HbA1c is used as a screening test:      >=6.5%   Diagnostic of Diabetes Mellitus               (if abnormal result is confirmed)     5.7-6.4%   Increased risk of developing Diabetes Mellitus     References:Diagnosis and Classification of Diabetes Mellitus,Diabetes     Care,2011,34(Suppl 1):S62-S69 and Standards of Medical Care in             Diabetes - 2011,Diabetes Care,2011,34 (Suppl  1):S11-S61.   Mean Plasma Glucose 117 (*) <117 mg/dL  LIPID PANEL     Status: Abnormal   Collection Time    06/22/13  5:45 AM      Result Value Range   Cholesterol 180  0 - 200 mg/dL   Triglycerides 413 (*) <150 mg/dL   HDL 56  >24 mg/dL   Total CHOL/HDL Ratio 3.2     VLDL 40  0 - 40 mg/dL   LDL Cholesterol 84  0 - 99 mg/dL   Comment:  Total Cholesterol/HDL:CHD Risk     Coronary Heart Disease Risk Table                         Men   Women      1/2 Average Risk   3.4   3.3      Average Risk       5.0   4.4      2 X Average Risk   9.6   7.1      3 X Average Risk  23.4   11.0                Use the calculated Patient Ratio     above and the CHD Risk Table     to determine the patient's CHD Risk.                ATP III CLASSIFICATION (LDL):      <100     mg/dL   Optimal      161-096  mg/dL   Near or Above                        Optimal      130-159  mg/dL   Borderline      045-409  mg/dL   High      >811     mg/dL   Very High  URINALYSIS, ROUTINE W REFLEX MICROSCOPIC     Status: Abnormal   Collection Time    06/22/13  7:59 AM      Result Value Range   Color, Urine YELLOW  YELLOW   APPearance HAZY (*) CLEAR   Specific Gravity, Urine 1.019  1.005 - 1.030   pH 6.0  5.0 - 8.0   Glucose, UA NEGATIVE  NEGATIVE mg/dL   Hgb urine dipstick NEGATIVE  NEGATIVE   Bilirubin Urine NEGATIVE  NEGATIVE   Ketones, ur NEGATIVE  NEGATIVE mg/dL   Protein, ur NEGATIVE  NEGATIVE mg/dL   Urobilinogen, UA 0.2  0.0 - 1.0 mg/dL   Nitrite POSITIVE (*) NEGATIVE   Leukocytes, UA MODERATE (*) NEGATIVE  URINE CULTURE     Status: None   Collection Time    06/22/13  7:59 AM      Result Value Range   Specimen Description URINE, CLEAN CATCH     Special Requests NONE     Culture  Setup Time 06/22/2013 13:43     Colony Count >=100,000 COLONIES/ML     Culture ESCHERICHIA COLI     Report Status PENDING    URINE RAPID DRUG SCREEN (HOSP PERFORMED)     Status: None   Collection Time     06/22/13  7:59 AM      Result Value Range   Opiates NONE DETECTED  NONE DETECTED   Cocaine NONE DETECTED  NONE DETECTED   Benzodiazepines NONE DETECTED  NONE DETECTED   Amphetamines NONE DETECTED  NONE DETECTED   Tetrahydrocannabinol NONE DETECTED  NONE DETECTED   Barbiturates NONE DETECTED  NONE DETECTED   Comment:            DRUG SCREEN FOR MEDICAL PURPOSES     ONLY.  IF CONFIRMATION IS NEEDED     FOR ANY PURPOSE, NOTIFY LAB     WITHIN 5 DAYS.                LOWEST DETECTABLE LIMITS     FOR URINE DRUG SCREEN  Drug Class       Cutoff (ng/mL)     Amphetamine      1000     Barbiturate      200     Benzodiazepine   200     Tricyclics       300     Opiates          300     Cocaine          300     THC              50  URINE MICROSCOPIC-ADD ON     Status: Abnormal   Collection Time    06/22/13  7:59 AM      Result Value Range   Squamous Epithelial / LPF RARE  RARE   WBC, UA 11-20  <3 WBC/hpf   RBC / HPF 0-2  <3 RBC/hpf   Bacteria, UA MANY (*) RARE  GLUCOSE, CAPILLARY     Status: Abnormal   Collection Time    06/22/13  8:00 AM      Result Value Range   Glucose-Capillary 116 (*) 70 - 99 mg/dL   Comment 1 Documented in Chart     Comment 2 Notify RN    GLUCOSE, CAPILLARY     Status: None   Collection Time    06/22/13 11:45 AM      Result Value Range   Glucose-Capillary 97  70 - 99 mg/dL   Comment 1 Notify RN     Comment 2 Documented in Chart    GLUCOSE, CAPILLARY     Status: Abnormal   Collection Time    06/22/13  4:48 PM      Result Value Range   Glucose-Capillary 108 (*) 70 - 99 mg/dL  GLUCOSE, CAPILLARY     Status: Abnormal   Collection Time    06/22/13  9:54 PM      Result Value Range   Glucose-Capillary 101 (*) 70 - 99 mg/dL   Comment 1 Documented in Chart     Comment 2 Notify RN    GLUCOSE, CAPILLARY     Status: None   Collection Time    06/23/13  6:36 AM      Result Value Range   Glucose-Capillary 88  70 - 99 mg/dL   Comment 1 Documented in Chart      Comment 2 Notify RN    GLUCOSE, CAPILLARY     Status: Abnormal   Collection Time    06/23/13 11:11 AM      Result Value Range   Glucose-Capillary 125 (*) 70 - 99 mg/dL   Ct Head Wo Contrast  06/23/2013   *RADIOLOGY REPORT*  Clinical Data: Stroke symptoms and, dysarthria  CT HEAD WITHOUT CONTRAST  Technique:  Contiguous axial images were obtained from the base of the skull through the vertex without contrast.  Comparison: 06/21/2013  Findings: Stable diffuse brain atrophy and periventricular white matter microvascular ischemic changes.  No acute intracranial hemorrhage, definite acute infarction, mass lesion, mass effect, or focal edema.  Stable midline ventricles.  No midline shift, herniation, or extra-axial fluid collection.  Cisterns patent.  No cerebellar abnormality.  Mastoids and sinuses clear.  IMPRESSION: Stable atrophy and microvascular ischemic changes.  No significant interval change.   Original Report Authenticated By: Judie Petit. Miles Costain, M.D.   Ct Head (brain) Wo Contrast  06/21/2013   *RADIOLOGY REPORT*  Clinical Data: Expressive aphasia for 2 days.  CT HEAD WITHOUT CONTRAST  Technique:  Contiguous axial images were obtained from the base of the skull through the vertex without contrast.  Comparison: 05/01/2010.  Findings:  Calvarium: No acute osseous abnormality.  No lytic or blastic lesion.  Orbits: Bilateral cataract resection.  Brain: No acute infarct is detected.  Similar pattern of patchy bilateral deep and subcortical cerebral white matter low attenuation.  Small remote ischemic insults to the peripheral left cerebellum.  No evidence of acute hemorrhage, hydrocephalus, mass effect/shift.  IMPRESSION: 1.  No acute infarct detected.  2.  Chronic small vessel ischemic changes.   Original Report Authenticated By: Tiburcio Pea    Assessment and plan discussed with with attending physician and they are in agreement.    Felicie Morn PA-C [facility] [phone]  06/23/2013, 1:58  PM   Assessment: 79 y.o. female history of nonischemic cardiomyopathy with EF of 30% and pacemaker.  She recently experienced transient exprssive aphasia which waxed and waned for about 3 hours and fully resolved.  Stroke workup was negative other than known cardiomyopathy.  Repeat CT was negative for stroke.    Stroke Risk Factors - atrial fibrillation, diabetes mellitus, hyperlipidemia and hypertension  Plan:  1. Prophylactic therapy-Antiplatelet med: Plavix - dose 75 mg daily 2. Continue stroke risk factors 3. Treat underlying UTI  No further neurological work-up necessary.   Patient seen and examined together with physician assistant and I concur with the assessment and plan.  Wyatt Portela, MD

## 2013-06-23 NOTE — Progress Notes (Signed)
Subjective:  Admitted with transient aphasia and trouble finding words.  Some difficulty yesterday also noted by OT. She saw a neurologist on 05/19/13.  No symptoms today.  Objective:  Vital Signs in the last 24 hours: BP 127/49  Pulse 68  Temp(Src) 97.7 F (36.5 C) (Oral)  Resp 20  Ht 5\' 3"  (1.6 m)  Wt 67.042 kg (147 lb 12.8 oz)  BMI 26.19 kg/m2  SpO2 99%  Physical Exam: Pleasant WF in NAD Lungs:  Clear to A&P Cardiac:  Regular rhythm, normal S1 and S2, no S3 Abdomen:  Soft, nontender, no masses Extremities:  No edema present  Intake/Output from previous day: 07/15 0701 - 07/16 0700 In: 480 [P.O.:480] Out: 300 [Urine:300] Weight Filed Weights   06/21/13 2136 06/21/13 2200  Weight: 66.18 kg (145 lb 14.4 oz) 67.042 kg (147 lb 12.8 oz)    Lab Results: Basic Metabolic Panel:  Recent Labs  16/10/96 1910 06/21/13 2014 06/22/13 0545  NA 138 139 141  K 4.5 4.4 4.3  CL 101 103 104  CO2 25  --  24  GLUCOSE 95 92 90  BUN 22 24* 21  CREATININE 1.35* 1.60* 1.49*    CBC:  Recent Labs  06/21/13 1910 06/21/13 2014 06/22/13 0545  WBC 9.7  --  6.9  NEUTROABS 5.4  --   --   HGB 13.7 13.6 13.4  HCT 39.6 40.0 40.0  MCV 89.2  --  90.9  PLT 244  --  219    Assessment/Plan:  1.  Intermittent aphasia 2. Biventricular pacer 3. Nonischemic cardiomyopathy 4. History of vasodepressor syncope with positive tilit  Rec:  I would ask neuro to see again while in hospital.  Interrogate pacer to see if any atrial fib.  ECHO shows EF 30% with some septal dyssynchrony.       Darden Palmer  MD [facility]  06/23/2013, 9:01 AM

## 2013-06-23 NOTE — Progress Notes (Signed)
Bilateral carotid artery duplex:  Less than 39% ICA stenosis.  Vertebral artery flow is antegrade.     

## 2013-06-23 NOTE — Discharge Summary (Signed)
Physician Discharge Summary  [name] ZOX:[id number] DOB: 08/22/1925 DOA: 06/21/2013  PCP: Nadean Corwin, MD  Admit date: 06/21/2013 Discharge date: 06/23/2013  Time spent: 65 minutes  Recommendations for Outpatient Follow-up:  1. Followup with Nadean Corwin, MD in 1 week. 2. Followup with Dr. Donnie Aho as previously scheduled.  Discharge Diagnoses:  Principal Problem:   Slurred speech Active Problems:   Dilated cardiomyopathy, nonischemic   Chronic systolic heart failure   Biventricular cardiac pacemaker in situ   Hyperlipidemia   Hypertension   Aphasia   Diabetes mellitus with neuropathy   CKD (chronic kidney disease), stage III   UTI (urinary tract infection)   Discharge Condition: Stable and improved  Diet recommendation: Carb modified  Filed Weights   06/21/13 2136 06/21/13 2200  Weight: 66.18 kg (145 lb 14.4 oz) 67.042 kg (147 lb 12.8 oz)    History of present illness:  [name] is a 79 y.o. Caucasian female with history of nonischemic cardiomyopathy with EF of 40% based on last 2-D echocardiogram on 10/22/2006, hypertension, diabetes not on any medications at home appears to be diet controlled, atrial fibrillation not on anticoagulation, hyperlipidemia, and Bi-V cardiac pacemaker who presents with the above complaints. Patient reports that her symptoms started yesterday afternoon when she had a sudden onset of difficulty speaking. She is unsure what time she had the onset of difficulty speaking and how long this lasted for, but eventually resolved. She had 2 episodes this morning with difficulty speaking and slurred speech while talking to the son on the telephone. Patient is not able to tell me at which time she had these episodes and for how long she had these episodes. However her symptoms resolved by the time her son and daughter came home. She called Dr. Donnie Aho, her cardiologist, who instructed her to come to the emergency department for further  evaluation. Patient denies any recent fevers, chills, nausea, vomiting, chest pain, shortness of breath, abdominal pain, diarrhea, headaches or vision changes. Denies any recent numbness/tingling or weakness anywhere in her body. She has chronic neuropathy in her feet bilaterally which is the only complaint she has at this time. Hospitalist service was asked to admit the patient for further care and management.    Hospital Course:  #1 transient expressive aphasia Patient was admitted with a transient expressive aphasia that was waxing and waning for about 3 hours. Patient has called her cardiologist Dr. Donnie Aho was transferred to present to the ED. Patient improved clinically and was admitted for stroke workup. Urinalysis which was done was positive for UTI and patient was treated appropriately. Patient was placed in the telemetry and neurology consultation was obtained. Patient was on aspirin prior to admission and this was subsequently changed to Plavix. 2-D echo which was obtained and no source of cardiac emboli. Carotid Dopplers which were obtained had no significant ICA stenosis. Patient improved clinically and was back to baseline by day of discharge. She'll be discharged home in stable and improved condition to followup with neurology as outpatient.  #2 urinary tract infection On admission urinalysis which was done was positive for UTI. Patient was placed empirically on IV antibiotics while urine cultures were obtained. Urine cultures were positive for greater than 100,000 Escherichia coli. Patient will be discharged home on 5 more days of oral Ceftin complete a one-week course of antibiotic therapy.  #3 diet-controlled type 2 diabetes Patient was maintained on a sliding scale during the hospitalization. Patient will followup with PCP as outpatient.  #4 hypertension  Remained stable during the hospitalization patient was maintained on a home regimen.  #5 hyperlipidemia Patient was maintained  on a home regimen during the hospitalization.  #6 known ischemic dilated cardiomyopathy status post biventricular cardiac pacemaker. Patient was seen during the hospitalization by her cardiologist Dr. Donnie Aho. Patient's pacemaker was interrogated and negative for atrial fibrillation. Patient remained stable and will be discharged in stable condition to followup with cardiologist as outpatient.  Procedures:   carotid Doppler 06/23/2013  2-D echo 06/22/2013  CT head 06/21/2013, 06/23/2013  Consultations:  Neurology: Dr. Cyril Mourning 06/23/2013  Cardiology: Dr. Donnie Aho 06/23/2013  Discharge Exam: Filed Vitals:   06/23/13 0530 06/23/13 1014 06/23/13 1411 06/23/13 1759  BP: 127/49 131/51 123/62 131/60  Pulse: 68 68 61 68  Temp: 97.7 F (36.5 C) 97.6 F (36.4 C) 97.6 F (36.4 C) 97.5 F (36.4 C)  TempSrc: Oral Oral Oral Oral  Resp: 20 18 16 17   Height:      Weight:      SpO2: 99% 98% 98% 100%    General: NAD Cardiovascular: RRR Respiratory: CTAB  Discharge Instructions  Discharge Orders   Future Appointments Provider Department Dept Phone   08/06/2013 2:15 PM Marinus Maw, MD [facility] Main [facility] [place]) [phone]   Future Orders Complete By Expires     Diet - low sodium heart healthy  As directed     Discharge instructions  As directed     Comments:      Follow up with Nadean Corwin, MD in 1 week. Follow up with Dr. Donnie Aho as scheduled.    Increase activity slowly  As directed         Medication List    STOP taking these medications       aspirin EC 81 MG tablet      TAKE these medications       b complex vitamins capsule  Take 1 capsule by mouth daily.     bumetanide 1 MG tablet  Commonly known as:  BUMEX  Take 0.5 mg by mouth daily.     carvedilol 6.25 MG tablet  Commonly known as:  COREG  Take 6.25 mg by mouth 2 (two) times daily with a meal.     cefUROXime 250 MG tablet  Commonly known as:  CEFTIN  Take 1 tablet (250 mg total)  by mouth 2 (two) times daily. Take for 5 days then stop.     clopidogrel 75 MG tablet  Commonly known as:  PLAVIX  Take 1 tablet (75 mg total) by mouth daily with breakfast.  Start taking on:  06/24/2013     ezetimibe 10 MG tablet  Commonly known as:  ZETIA  Take 10 mg by mouth daily. Take 1/4 daily     gabapentin 300 MG capsule  Commonly known as:  NEURONTIN  Take 300 mg by mouth at bedtime.     losartan 50 MG tablet  Commonly known as:  COZAAR  Take 25 mg by mouth daily.     magnesium gluconate 500 MG tablet  Commonly known as:  MAGONATE  Take 500 mg by mouth 2 (two) times daily.     pravastatin 40 MG tablet  Commonly known as:  PRAVACHOL  Take 40 mg by mouth daily.     ranitidine 300 MG tablet  Commonly known as:  ZANTAC  Take 300 mg by mouth every morning.     Vitamin D3 1000 UNITS Caps  Take 1,000-2,000 Units by mouth daily. Take 1000mg  on Monday,  Wednesday, and Friday. Takes 2000mg  all other days       No Known Allergies     Follow-up Information   Follow up with MCKEOWN,WILLIAM DAVID, MD. Schedule an appointment as soon as possible for a visit in 1 week.   Contact information:   [address] [place] [address] [phone]       Follow up with Darden Palmer, MD. (f/u as scheduled)    Contact information:   [address] [place] [address] [phone]        The results of significant diagnostics from this hospitalization (including imaging, microbiology, ancillary and laboratory) are listed below for reference.    Significant Diagnostic Studies: Ct Head Wo Contrast  06/23/2013   *RADIOLOGY REPORT*  Clinical Data: Stroke symptoms and, dysarthria  CT HEAD WITHOUT CONTRAST  Technique:  Contiguous axial images were obtained from the base of the skull through the vertex without contrast.  Comparison: 06/21/2013  Findings: Stable diffuse brain atrophy and periventricular white matter microvascular ischemic  changes.  No acute intracranial hemorrhage, definite acute infarction, mass lesion, mass effect, or focal edema.  Stable midline ventricles.  No midline shift, herniation, or extra-axial fluid collection.  Cisterns patent.  No cerebellar abnormality.  Mastoids and sinuses clear.  IMPRESSION: Stable atrophy and microvascular ischemic changes.  No significant interval change.   Original Report Authenticated By: Judie Petit. Miles Costain, M.D.   Ct Head (brain) Wo Contrast  06/21/2013   *RADIOLOGY REPORT*  Clinical Data: Expressive aphasia for 2 days.  CT HEAD WITHOUT CONTRAST  Technique:  Contiguous axial images were obtained from the base of the skull through the vertex without contrast.  Comparison: 05/01/2010.  Findings:  Calvarium: No acute osseous abnormality.  No lytic or blastic lesion.  Orbits: Bilateral cataract resection.  Brain: No acute infarct is detected.  Similar pattern of patchy bilateral deep and subcortical cerebral white matter low attenuation.  Small remote ischemic insults to the peripheral left cerebellum.  No evidence of acute hemorrhage, hydrocephalus, mass effect/shift.  IMPRESSION: 1.  No acute infarct detected.  2.  Chronic small vessel ischemic changes.   Original Report Authenticated By: Tiburcio Pea    Microbiology: Recent Results (from the past 240 hour(s))  URINE CULTURE     Status: None   Collection Time    06/22/13  7:59 AM      Result Value Range Status   Specimen Description URINE, CLEAN CATCH   Final   Special Requests NONE   Final   Culture  Setup Time 06/22/2013 13:43   Final   Colony Count >=100,000 COLONIES/ML   Final   Culture ESCHERICHIA COLI   Final   Report Status PENDING   Incomplete     Labs: Basic Metabolic Panel:  Recent Labs Lab 06/21/13 1910 06/21/13 2014 06/22/13 0545  NA 138 139 141  K 4.5 4.4 4.3  CL 101 103 104  CO2 25  --  24  GLUCOSE 95 92 90  BUN 22 24* 21  CREATININE 1.35* 1.60* 1.49*  CALCIUM 9.7  --  9.0   Liver Function  Tests:  Recent Labs Lab 06/21/13 1910  AST 25  ALT 17  ALKPHOS 56  BILITOT 0.4  PROT 6.6  ALBUMIN 3.7   No results found for this basename: LIPASE, AMYLASE,  in the last 168 hours No results found for this basename: AMMONIA,  in the last 168 hours CBC:  Recent Labs Lab 06/21/13 1910 06/21/13 2014 06/22/13 0545  WBC 9.7  --  6.9  NEUTROABS 5.4  --   --   HGB 13.7 13.6 13.4  HCT 39.6 40.0 40.0  MCV 89.2  --  90.9  PLT 244  --  219   Cardiac Enzymes: No results found for this basename: CKTOTAL, CKMB, CKMBINDEX, TROPONINI,  in the last 168 hours BNP: BNP (last 3 results) No results found for this basename: PROBNP,  in the last 8760 hours CBG:  Recent Labs Lab 06/22/13 1648 06/22/13 2154 06/23/13 0636 06/23/13 1111 06/23/13 1636  GLUCAP 108* 101* 88 125* 88       Signed:  Lamyra Malcolm  [facility] 06/23/2013, 6:03 PM

## 2013-06-23 NOTE — Progress Notes (Addendum)
Physical Therapy Treatment and Discharge Patient Details Name: [name] MRN: [id number] DOB: 02-24-1925 Today's Date: 06/23/2013 Time: 4098-1191 PT Time Calculation (min): 18 min  PT Assessment / Plan / Recommendation  PT Comments   Still ambulating at slower speed today however able to adjust better for dynamic challenges. Still scoring 19/24 on DGI indicating risk for falls. Re-educated pt on benefits of OPPT for full balance w/u. Pt is modified independent otherwise, no further acute PT needs at this time. Encouraged pt to ambulate with nsg if staying here longer.   Follow Up Recommendations  Outpatient PT     Does the patient have the potential to tolerate intense rehabilitation     Barriers to Discharge        Equipment Recommendations  None recommended by PT    Recommendations for Other Services    Frequency     Progress towards PT Goals Progress towards PT goals: Goals met/education completed, patient discharged from PT  Plan Current plan remains appropriate    Precautions / Restrictions Precautions Precautions: Fall Restrictions Weight Bearing Restrictions: No   Pertinent Vitals/Pain Denies pain    Mobility  Bed Mobility Bed Mobility: Supine to Sit;Sit to Supine Supine to Sit: 7: Independent Sit to Supine: 7: Independent Transfers Transfers: Sit to Stand;Stand to Sit Sit to Stand: 5: Supervision;From bed Stand to Sit: 5: Supervision;To bed Details for Transfer Assistance: again today stood initially and had to sit right back down to catch her balance, stood again without physical assist and able to steady herself Ambulation/Gait Ambulation/Gait Assistance: 6: Modified independent (Device/Increase time);5: Supervision Ambulation Distance (Feet): 600 Feet Assistive device: None Ambulation/Gait Assistance Details: supervision for higher level activities, modified independent for straight path ambulation (slower speed) Gait Pattern: Step-through  pattern;Decreased stride length Gait velocity: decreased, limited ability to increase speed significantly however able to slow down and stop as needed for obstacles in the hallway General Gait Details: downward gaze Stairs Assistance: 6: Modified independent (Device/Increase time) Stair Management Technique: Two rails;Alternating pattern Number of Stairs: 2     PT Goals (current goals can now be found in the care plan section)    Visit Information  Last PT Received On: 06/23/13 Assistance Needed: +1 History of Present Illness: 79 y.o. female with hx of hypertension, DM, atrial fibrillation not on anticoagulation, hyperlipidemia, and Bi-V cardiac pacemaker who presents with sudden onset of difficulty speaking. She is unsure what time she had the onset of difficulty speaking and how long this lasted for, but eventually resolved. She had 2 episodes this morning with difficulty speaking and slurred speech while talking to the son on the telephone.Son called Dr. Donnie Aho, her cardiologist, who instructed her to come to the emergency department for further evaluation. Ct scan negative and pacemaker prevents MRI    Subjective Data      Cognition  Cognition Arousal/Alertness: Awake/alert Behavior During Therapy: WFL for tasks assessed/performed Overall Cognitive Status: Within Functional Limits for tasks assessed    Balance  Dynamic Gait Index Level Surface: Mild Impairment Change in Gait Speed: Mild Impairment Gait with Horizontal Head Turns: Mild Impairment Gait with Vertical Head Turns: Normal Gait and Pivot Turn: Normal Step Over Obstacle: Normal Step Around Obstacles: Mild Impairment Steps: Mild Impairment Total Score: 19  End of Session PT - End of Session Equipment Utilized During Treatment: Gait belt Activity Tolerance: Patient tolerated treatment well Patient left: in bed;with call bell/phone within reach;with bed alarm set Nurse Communication: Mobility status   GP  [facility] HELEN 06/23/2013, 4:16 PM

## 2013-06-28 DIAGNOSIS — H35379 Puckering of macula, unspecified eye: Secondary | ICD-10-CM | POA: Diagnosis not present

## 2013-07-01 ENCOUNTER — Encounter: Payer: Self-pay | Admitting: Internal Medicine

## 2013-07-09 DIAGNOSIS — E782 Mixed hyperlipidemia: Secondary | ICD-10-CM | POA: Diagnosis not present

## 2013-07-09 DIAGNOSIS — I10 Essential (primary) hypertension: Secondary | ICD-10-CM | POA: Diagnosis not present

## 2013-07-09 DIAGNOSIS — N3000 Acute cystitis without hematuria: Secondary | ICD-10-CM | POA: Diagnosis not present

## 2013-07-09 DIAGNOSIS — Z79899 Other long term (current) drug therapy: Secondary | ICD-10-CM | POA: Diagnosis not present

## 2013-07-09 DIAGNOSIS — R7309 Other abnormal glucose: Secondary | ICD-10-CM | POA: Diagnosis not present

## 2013-07-09 DIAGNOSIS — E559 Vitamin D deficiency, unspecified: Secondary | ICD-10-CM | POA: Diagnosis not present

## 2013-07-26 DIAGNOSIS — R3989 Other symptoms and signs involving the genitourinary system: Secondary | ICD-10-CM | POA: Diagnosis not present

## 2013-08-06 ENCOUNTER — Ambulatory Visit (INDEPENDENT_AMBULATORY_CARE_PROVIDER_SITE_OTHER): Payer: Medicare Other | Admitting: Internal Medicine

## 2013-08-06 ENCOUNTER — Encounter: Payer: Self-pay | Admitting: Internal Medicine

## 2013-08-06 VITALS — BP 117/67 | HR 73 | Ht 63.0 in | Wt 148.4 lb

## 2013-08-06 DIAGNOSIS — Z950 Presence of cardiac pacemaker: Secondary | ICD-10-CM

## 2013-08-06 DIAGNOSIS — I420 Dilated cardiomyopathy: Secondary | ICD-10-CM

## 2013-08-06 DIAGNOSIS — I4460 Unspecified fascicular block: Secondary | ICD-10-CM

## 2013-08-06 DIAGNOSIS — I428 Other cardiomyopathies: Secondary | ICD-10-CM

## 2013-08-06 DIAGNOSIS — I10 Essential (primary) hypertension: Secondary | ICD-10-CM

## 2013-08-06 DIAGNOSIS — I5022 Chronic systolic (congestive) heart failure: Secondary | ICD-10-CM

## 2013-08-06 LAB — PACEMAKER DEVICE OBSERVATION
AL AMPLITUDE: 1.5 mv
AL IMPEDENCE PM: 532 Ohm
AL THRESHOLD: 0.5 V
RV LEAD AMPLITUDE: 17.5 mv
RV LEAD IMPEDENCE PM: 532 Ohm

## 2013-08-06 NOTE — Progress Notes (Signed)
HPI [name] returns today for followup.  She is a very pleasant 79 year old woman with a history of chronic systolic heart failure, status post ICD implantation, who underwent removal of her prior ICD and insertion of a biventricular pacemaker. She returns today for followup. She has done well in the interim, returning to gardening and working in her kitchen and yard. She denies chest pain or shortness of breath. No syncope. No Known Allergies   Current Outpatient Prescriptions  Medication Sig Dispense Refill  . b complex vitamins capsule Take 1 capsule by mouth daily.       . bumetanide (BUMEX) 1 MG tablet Take 0.5 mg by mouth daily.      . carvedilol (COREG) 6.25 MG tablet Take 6.25 mg by mouth 2 (two) times daily with a meal.      . Cholecalciferol (VITAMIN D3) 1000 UNITS CAPS Take 1,000-2,000 Units by mouth daily. Take 1000mg  on Monday, Wednesday, and Friday. Takes 2000mg  all other days      . clopidogrel (PLAVIX) 75 MG tablet Take 1 tablet (75 mg total) by mouth daily with breakfast.  30 tablet  0  . ezetimibe (ZETIA) 10 MG tablet Take 10 mg by mouth daily. Take 1/4 daily      . gabapentin (NEURONTIN) 300 MG capsule Take 300 mg by mouth at bedtime.      Marland Kitchen losartan (COZAAR) 50 MG tablet Take 25 mg by mouth daily.       . magnesium gluconate (MAGONATE) 500 MG tablet Take 500 mg by mouth 2 (two) times daily.      . pravastatin (PRAVACHOL) 40 MG tablet Take 40 mg by mouth daily.      . ranitidine (ZANTAC) 300 MG tablet Take 300 mg by mouth every morning.       No current facility-administered medications for this visit.     Past Medical History  Diagnosis Date  . Secondary cardiomyopathy, unspecified   . Occlusion and stenosis of carotid artery without mention of cerebral infarction   . Hypertension   . GERD (gastroesophageal reflux disease)   . Diabetes mellitus without complication   . A-fib   . Biventricular cardiac pacemaker in situ     05/06/13  Old AICD explanted and 6949  lead capped  RV lead  Medtronic 5076 [id number]  LV lead  Medtronic 4194  [id number] V Medtronic biventricular pacer  PVX [id number]   . Hyperlipidemia   . Peripheral neuropathy   . History of esophageal stricture   . History of duodenal ulcer   . Transient global amnesia     ROS:   All systems reviewed and negative except as noted in the HPI.   Past Surgical History  Procedure Laterality Date  . Cardiac defibrillator placement  09/2004    MDT ICD implanted for primary prevention; device upgrade to CRTP in 04-2013  . Cardiac catheterization      04/2003  . Appendectomy    . Abdominal hysterectomy    . Biv pacemaker generator change out  05/05/2013    MDT CRTP upgrade (previously implanted dual chamber ICD) by Dr Ladona Ridgel.  New RV/LV leads placed.   . Carpal tunnel release    . Cholecystectomy       Family History  Problem Relation Age of Onset  . Cancer Maternal Grandmother     breast  . Dementia Mother   . Heart Problems Father   . Stroke Son   . Stroke Daughter      History  Social History  . Marital Status: Widowed    Spouse Name: N/A    Number of Children: 2  . Years of Education: 12   Occupational History  . Not on file.   Social History Main Topics  . Smoking status: Passive Smoke Exposure - Never Smoker  . Smokeless tobacco: Never Used  . Alcohol Use: No  . Drug Use: No  . Sexual Activity: Not on file   Other Topics Concern  . Not on file   Social History Narrative  . No narrative on file     BP 117/67  Pulse 73  Ht 5\' 3"  (1.6 m)  Wt 148 lb 6.4 oz (67.314 kg)  BMI 26.29 kg/m2  Physical Exam:  Well appearing elderly woman, NAD HEENT: Unremarkable Neck:  7 cm JVD, no thyromegally Back:  No CVA tenderness Lungs:  Clear with no wheezes, rales, or rhonchi. Well-healed pacemaker incision. HEART:  Regular rate rhythm, no murmurs, no rubs, no clicks Abd:  soft, positive bowel sounds, no organomegally, no rebound, no guarding Ext:  2 plus pulses,  no edema, no cyanosis, no clubbing Skin:  No rashes no nodules Neuro:  CN II through XII intact, motor grossly intact  EKG - normal sinus rhythm with biventricular pacing.  DEVICE  Normal device function.  See PaceArt for details.   Assess/Plan:

## 2013-08-06 NOTE — Assessment & Plan Note (Signed)
Her heart failure symptoms are class IIA. She'll continue her current medical therapy, and maintain a low-sodium diet.

## 2013-08-06 NOTE — Patient Instructions (Signed)
Your physician wants you to follow-up in: 9 months with Dr Taylor.  You will receive a reminder letter in the mail two months in advance. If you don't receive a letter, please call our office to schedule the follow-up appointment.  

## 2013-08-06 NOTE — Assessment & Plan Note (Signed)
Her blood pressure today is well controlled. No change in medical therapy. 

## 2013-08-06 NOTE — Assessment & Plan Note (Signed)
Her Medtronic biventricular pacemaker appears to be working normally. We'll plan to recheck in several months.

## 2013-08-18 DIAGNOSIS — I6529 Occlusion and stenosis of unspecified carotid artery: Secondary | ICD-10-CM | POA: Diagnosis not present

## 2013-08-18 DIAGNOSIS — I951 Orthostatic hypotension: Secondary | ICD-10-CM | POA: Diagnosis not present

## 2013-08-18 DIAGNOSIS — R0602 Shortness of breath: Secondary | ICD-10-CM | POA: Diagnosis not present

## 2013-08-18 DIAGNOSIS — Z95810 Presence of automatic (implantable) cardiac defibrillator: Secondary | ICD-10-CM | POA: Diagnosis not present

## 2013-08-18 DIAGNOSIS — I428 Other cardiomyopathies: Secondary | ICD-10-CM | POA: Diagnosis not present

## 2013-08-18 DIAGNOSIS — E785 Hyperlipidemia, unspecified: Secondary | ICD-10-CM | POA: Diagnosis not present

## 2013-08-18 DIAGNOSIS — I10 Essential (primary) hypertension: Secondary | ICD-10-CM | POA: Diagnosis not present

## 2013-08-18 DIAGNOSIS — I509 Heart failure, unspecified: Secondary | ICD-10-CM | POA: Diagnosis not present

## 2013-08-23 DIAGNOSIS — R0602 Shortness of breath: Secondary | ICD-10-CM | POA: Diagnosis not present

## 2013-08-23 DIAGNOSIS — Z8673 Personal history of transient ischemic attack (TIA), and cerebral infarction without residual deficits: Secondary | ICD-10-CM | POA: Diagnosis not present

## 2013-08-23 DIAGNOSIS — I6529 Occlusion and stenosis of unspecified carotid artery: Secondary | ICD-10-CM | POA: Diagnosis not present

## 2013-08-23 DIAGNOSIS — I10 Essential (primary) hypertension: Secondary | ICD-10-CM | POA: Diagnosis not present

## 2013-08-23 DIAGNOSIS — E785 Hyperlipidemia, unspecified: Secondary | ICD-10-CM | POA: Diagnosis not present

## 2013-08-23 DIAGNOSIS — I509 Heart failure, unspecified: Secondary | ICD-10-CM | POA: Diagnosis not present

## 2013-08-23 DIAGNOSIS — I951 Orthostatic hypotension: Secondary | ICD-10-CM | POA: Diagnosis not present

## 2013-08-23 DIAGNOSIS — I428 Other cardiomyopathies: Secondary | ICD-10-CM | POA: Diagnosis not present

## 2013-10-05 DIAGNOSIS — J019 Acute sinusitis, unspecified: Secondary | ICD-10-CM | POA: Diagnosis not present

## 2013-10-16 ENCOUNTER — Encounter: Payer: Self-pay | Admitting: Internal Medicine

## 2013-10-19 ENCOUNTER — Other Ambulatory Visit: Payer: Self-pay | Admitting: Internal Medicine

## 2013-10-19 ENCOUNTER — Ambulatory Visit: Payer: Medicare Other | Admitting: Internal Medicine

## 2013-10-19 ENCOUNTER — Encounter: Payer: Self-pay | Admitting: Internal Medicine

## 2013-10-19 VITALS — BP 134/68 | HR 72 | Temp 98.2°F | Resp 18 | Ht 63.0 in | Wt 150.0 lb

## 2013-10-19 DIAGNOSIS — R7309 Other abnormal glucose: Secondary | ICD-10-CM | POA: Diagnosis not present

## 2013-10-19 DIAGNOSIS — Z1212 Encounter for screening for malignant neoplasm of rectum: Secondary | ICD-10-CM

## 2013-10-19 DIAGNOSIS — Z23 Encounter for immunization: Secondary | ICD-10-CM

## 2013-10-19 DIAGNOSIS — E559 Vitamin D deficiency, unspecified: Secondary | ICD-10-CM

## 2013-10-19 DIAGNOSIS — Z79899 Other long term (current) drug therapy: Secondary | ICD-10-CM

## 2013-10-19 DIAGNOSIS — Z0000 Encounter for general adult medical examination without abnormal findings: Secondary | ICD-10-CM

## 2013-10-19 DIAGNOSIS — I10 Essential (primary) hypertension: Secondary | ICD-10-CM

## 2013-10-19 DIAGNOSIS — E782 Mixed hyperlipidemia: Secondary | ICD-10-CM

## 2013-10-19 DIAGNOSIS — N3000 Acute cystitis without hematuria: Secondary | ICD-10-CM

## 2013-10-19 LAB — CBC WITH DIFFERENTIAL/PLATELET
Basophils Absolute: 0.1 10*3/uL (ref 0.0–0.1)
Basophils Relative: 1 % (ref 0–1)
Hemoglobin: 12.6 g/dL (ref 12.0–15.0)
MCHC: 34.7 g/dL (ref 30.0–36.0)
Monocytes Relative: 7 % (ref 3–12)
Neutro Abs: 6.4 10*3/uL (ref 1.7–7.7)
Neutrophils Relative %: 65 % (ref 43–77)
Platelets: 290 10*3/uL (ref 150–400)
RDW: 13.2 % (ref 11.5–15.5)

## 2013-10-19 LAB — LIPID PANEL
Cholesterol: 174 mg/dL (ref 0–200)
HDL: 47 mg/dL
LDL Cholesterol: 53 mg/dL (ref 0–99)
Total CHOL/HDL Ratio: 3.7 ratio
Triglycerides: 369 mg/dL — ABNORMAL HIGH
VLDL: 74 mg/dL — ABNORMAL HIGH (ref 0–40)

## 2013-10-19 LAB — BASIC METABOLIC PANEL WITHOUT GFR
BUN: 16 mg/dL (ref 6–23)
CO2: 27 meq/L (ref 19–32)
Calcium: 9.4 mg/dL (ref 8.4–10.5)
Chloride: 103 meq/L (ref 96–112)
Creat: 1.28 mg/dL — ABNORMAL HIGH (ref 0.50–1.10)
GFR, Est African American: 44 mL/min — ABNORMAL LOW
GFR, Est Non African American: 38 mL/min — ABNORMAL LOW
Glucose, Bld: 80 mg/dL (ref 70–99)
Potassium: 4.8 meq/L (ref 3.5–5.3)
Sodium: 139 meq/L (ref 135–145)

## 2013-10-19 LAB — HEPATIC FUNCTION PANEL
ALT: 11 U/L (ref 0–35)
AST: 18 U/L (ref 0–37)
Albumin: 3.9 g/dL (ref 3.5–5.2)
Alkaline Phosphatase: 52 U/L (ref 39–117)
Bilirubin, Direct: 0.1 mg/dL (ref 0.0–0.3)
Indirect Bilirubin: 0.3 mg/dL (ref 0.0–0.9)
Total Bilirubin: 0.4 mg/dL (ref 0.3–1.2)
Total Protein: 6.1 g/dL (ref 6.0–8.3)

## 2013-10-19 LAB — TSH: TSH: 1.867 u[IU]/mL (ref 0.350–4.500)

## 2013-10-19 MED ORDER — ATORVASTATIN CALCIUM 80 MG PO TABS: 80.0000 mg | ORAL_TABLET | Freq: Every day | ORAL | Status: DC

## 2013-10-19 NOTE — Progress Notes (Signed)
Patient ID: [name], female   DOB: [birth year], 79 y.o.   MRN: [id number]   HPI Patient presents for complete physical. Patient has long standing hx/o HTN, ASHD, CHF, non-ischemic dilated cardiomyopathy, hx/o cardiac arrhythmia for which she has a biventricular pacemaker(2004). Her primary cardiologist is Dr Donnie Aho who follows her regularly. She did have negative stress Myoview in 2010. Patient's BP has been controlled at home. Patient denies any cardiac symptoms as chest pain, palpitations, shortness of breath, dizziness or ankle swelling. Patient is felt to have had TIA when hospitalized earlier this year when she presented with slurred speech and had a negative CTs of brain as symptoms resolved.  Patient's hyperlipidemia is controlled with diet and medications. Patient denies myalgias or other medication SE's. Last cholesterol last visit was 204 , triglycerides 224, HDL 60 and LDL 99.   Patient has prediabetes/insulin resistance with last A1c  5.7%. Patient denies reactive hypoglycemic symptoms, visual blurring, diabetic polys, or paresthesias.   Also, patient has GERD and stricture which is controlled with diet and medications.   Other problems include Vitamin D Deficiency for which patient is supplementing Vitamin D.      Medication List                    ADULT SUPPOSITORY RE  Place rectally. Valium vaginal suppository    1 bid     atorvastatin 80 MG tablet  Commonly known as:  LIPITOR  Take 1 tablet (80 mg total) by mouth daily.     b complex vitamins capsule  Take 1 capsule by mouth daily.     bumetanide 1 MG tablet  Commonly known as:  BUMEX  Take 0.5 mg by mouth daily.     carvedilol 6.25 MG tablet  Commonly known as:  COREG  Take 6.25 mg by mouth 2 (two) times daily with a meal.     clopidogrel 75 MG tablet  Commonly known as:  PLAVIX  Take 1 tablet (75 mg total) by mouth daily with breakfast.     gabapentin 300 MG capsule  Commonly known as:  NEURONTIN   Take 300 mg by mouth at bedtime.     losartan 50 MG tablet  Commonly known as:  COZAAR  Take 25 mg by mouth daily.     magnesium gluconate 500 MG tablet  Commonly known as:  MAGONATE  Take 500 mg by mouth 2 (two) times daily.     ranitidine 300 MG tablet  Commonly known as:  ZANTAC  Take 300 mg by mouth every morning.     Vitamin D3 1000 UNITS Caps  Take 1,000-2,000 Units by mouth daily. Take 1000mg  on Monday, Wednesday, and Friday. Takes 2000mg  all other days        Health Maintenance  Topic Date Due  . Zostavax  09/25/1987  . Tetanus/tdap  12/09/2012  . Influenza Vaccine  07/09/2013  . Colonoscopy  06/08/2021  . Pneumococcal Polysaccharide Vaccine Age 70 And Over  Completed    Allergies  Allergen Reactions  . Nexium [Esomeprazole Magnesium]     Diarrhea    Past Medical History  Diagnosis Date  . Secondary cardiomyopathy, unspecified   . Occlusion and stenosis of carotid artery without mention of cerebral infarction   . GERD (gastroesophageal reflux disease)   . Diabetes mellitus without complication   . A-fib   . Biventricular cardiac pacemaker in situ     05/06/13  Old AICD explanted and 6949 lead capped  RV lead  Medtronic [id number]  LV lead  Medtronic [id number]  [id number] V Medtronic biventricular pacer  PVX [id number] S   . Hyperlipidemia   . Peripheral neuropathy   . History of esophageal stricture   . History of duodenal ulcer   . Transient global amnesia   . Hypertension     Past Surgical History  Procedure Laterality Date  . Cardiac defibrillator placement  09/2004    MDT ICD implanted for primary prevention; device upgrade to CRTP in 04-2013  . Cardiac catheterization      04/2003  . Appendectomy    . Abdominal hysterectomy    . Biv pacemaker generator change out  05/05/2013    MDT CRTP upgrade (previously implanted dual chamber ICD) by Dr Ladona Ridgel.  New RV/LV leads placed.   . Carpal tunnel release    . Cholecystectomy    . Breast surgery Left 1985     Negative  Biopsy    Family History  Problem Relation Age of Onset  . Cancer Maternal Grandmother     breast  . Dementia Mother   . Diabetes Mother   . Heart Problems Father   . Heart attack Son   . Heart disease Son   . Stroke Daughter   . Hypertension Daughter   . Hyperlipidemia Daughter   . Diabetes Daughter   . Diabetes Sister   . Hypertension Sister   . Hyperlipidemia Sister   . Heart disease Brother   . Heart attack Brother   . Hypertension Sister   . Hyperlipidemia Sister   . Cancer Sister     Breast  . Heart Problems Sister     History   Social History  . Marital Status: Widowed    Spouse Name: N/A    Number of Children: 2  . Years of Education: 12   Occupational History  . Not on file.   Social History Main Topics  . Smoking status: Passive Smoke Exposure - Never Smoker  . Smokeless tobacco: Never Used  . Alcohol Use: No  . Drug Use: No  . Sexual Activity: Not on file   Other Topics Concern  . Not on file   Social History Narrative  . No narrative on file    SYSTEMS REVIEW Constitutional: Denies fever, chills, weight loss/gain, headaches, insomnia, fatigue, night sweats, and change in appetite. Eyes: Denies redness, blurred vision, diplopia, discharge, itchy, watery eyes.  ENT: Denies discharge, congestion, post nasal drip, epistaxis, sore throat, earache, hearing loss, dental pain, Tinnitus, Vertigo, Sinus pain, snoring.  Cardio: Denies chest pain, palpitations, irregular heartbeat, syncope, dyspnea, diaphoresis, orthopnea, PND, claudication, edema Respiratory: denies cough, dyspnea, DOE, pleurisy, hoarseness, laryngitis, wheezing.  Gastrointestinal: Denies dysphagia, heartburn, reflux, water brash, pain, cramps, nausea, vomiting, bloating, diarrhea, constipation, hematemesis, melena, hematochezia, jaundice, hemorrhoids Genitourinary: Denies dysuria, frequency, urgency, nocturia, hesitancy, discharge, hematuria, flank pain Musculoskeletal:  Denies arthralgia, myalgia, stiffness, Jt. Swelling, pain, limp, and strain/sprain. Skin: Denies puritis, rash, hives, warts, acne, eczema, changing in skin lesion Neuro: Weakness, tremor, incoordination, spasms, paresthesia, pain Psychiatric: Denies confusion, memory loss, sensory loss Endocrine: Denies change in weight, skin, hair change, nocturia, and paresthesia, Diabetic Polys, visual blurring, hyper /hypo glycemic episodes.  Heme/Lymph: Excessive bleeding, bruising, enlarged lymph node   Physical Exam: Filed Vitals:   10/19/13 1418  BP: 134/68  Pulse: 72  Temp: 98.2 F (36.8 C)  Resp: 18   Body mass index is 26.58 kg/(m^2).  General Appearance: Well nourished, in no apparent distress. Eyes: PERRLA, EOMs, conjunctiva no swelling or  erythema, normal fundi and vessels. Sinuses: No Frontal/maxillary tenderness ENT/Mouth: EACs Patent / TMs  nl. Nares clear without erythema, swelling, mucoid exudates. Good dentition. No erythema, swelling, or exudate on post pharynx. Tongue normal, non-obstructing. Tonsils not swollen or erythematous. Hearing normal.  Neck: Supple, thyroid normal. No bruits or JVD. Respiratory: Respiratory effort normal.  BS equal bilaterally without rales, rhonci, wheezing or stridor. Cardio: Heart sounds normal, regular rate and rhythm without murmurs, rubs or gallops. Peripheral pulses brisk and equal bilaterally, without edema. No aortic or femoral bruits. Chest: symmetric, with normal excursions and percussion. Breasts: Normal w/ abn. Masses or tenderness Abdomen: Flat, soft, with bowl sounds. Nontender, no guarding, rebound, hernias, masses, or organomegaly.  Lymphatics: Non tender without lymphadenopathy.  Genitourinary: Musculoskeletal: Full ROM all peripheral extremities, joint stability, 5/5 strength, and normal gait. Skin: Warm, dry without rashes, lesions, ecchymosis.  Neuro: Cranial nerves intact, reflexes equal bilaterally. Normal muscle tone, no  cerebellar symptoms. Sensation intact.  Pysch: Awake and oriented X 3, normal affect, Insight and Judgment appropriate.   Assessment and Plan  1.Hypertension - continue diet, exercise and meds as discussed  2. Hyperlipidemia - continue low cholesterol diet, exercise and since apparently off pravastatin/simvastatin - will try on alternate day atorvastatin to see if she tolerates this.  3. Prediabetes/Insulin Resistance - diet, exercise, weight loss as discussed  4. Vitamin D Deficiency - Supplement as discussed   Plan routine screening labs, EKG, hemoccult, Aortoscan

## 2013-10-19 NOTE — Patient Instructions (Signed)
Continue diet and meds as discussed. Further disposition pending results of labs.  Stop Zetia as discussed due to cost. Also not taking simvastatin or pravastatin. Start Atorvastatin 80 mg 1/2 tab 3 x / week on mon - wed - fri  Hypertension As your heart beats, it forces blood through your arteries. This force is your blood pressure. If the pressure is too high, it is called hypertension (HTN) or high blood pressure. HTN is dangerous because you may have it and not know it. High blood pressure may mean that your heart has to work harder to pump blood. Your arteries may be narrow or stiff. The extra work puts you at risk for heart disease, stroke, and other problems.  Blood pressure consists of two numbers, a higher number over a lower, 110/72, for example. It is stated as "110 over 72." The ideal is below 120 for the top number (systolic) and under 80 for the bottom (diastolic). Write down your blood pressure today. You should pay close attention to your blood pressure if you have certain conditions such as:  Heart failure.  Prior heart attack.  Diabetes  Chronic kidney disease.  Prior stroke.  Multiple risk factors for heart disease. To see if you have HTN, your blood pressure should be measured while you are seated with your arm held at the level of the heart. It should be measured at least twice. A one-time elevated blood pressure reading (especially in the Emergency Department) does not mean that you need treatment. There may be conditions in which the blood pressure is different between your right and left arms. It is important to see your caregiver soon for a recheck. Most people have essential hypertension which means that there is not a specific cause. This type of high blood pressure may be lowered by changing lifestyle factors such as:  Stress.  Smoking.  Lack of exercise.  Excessive weight.  Drug/tobacco/alcohol use.  Eating less salt. Most people do not have symptoms  from high blood pressure until it has caused damage to the body. Effective treatment can often prevent, delay or reduce that damage. TREATMENT  When a cause has been identified, treatment for high blood pressure is directed at the cause. There are a large number of medications to treat HTN. These fall into several categories, and your caregiver will help you select the medicines that are best for you. Medications may have side effects. You should review side effects with your caregiver. If your blood pressure stays high after you have made lifestyle changes or started on medicines,   Your medication(s) may need to be changed.  Other problems may need to be addressed.  Be certain you understand your prescriptions, and know how and when to take your medicine.  Be sure to follow up with your caregiver within the time frame advised (usually within two weeks) to have your blood pressure rechecked and to review your medications.  If you are taking more than one medicine to lower your blood pressure, make sure you know how and at what times they should be taken. Taking two medicines at the same time can result in blood pressure that is too low. SEEK IMMEDIATE MEDICAL CARE IF:  You develop a severe headache, blurred or changing vision, or confusion.  You have unusual weakness or numbness, or a faint feeling.  You have severe chest or abdominal pain, vomiting, or breathing problems. MAKE SURE YOU:   Understand these instructions.  Will watch your condition.  Will get  help right away if you are not doing well or get worse. Document Released: 11/25/2005 Document Revised: 02/17/2012 Document Reviewed: 07/15/2008 [facility] Patient Information 2014 [place], [place].   Diabetes and Exercise Exercising regularly is important. It is not just about losing weight. It has many health benefits, such as:  Improving your overall fitness, flexibility, and endurance.  Increasing your bone  density.  Helping with weight control.  Decreasing your body fat.  Increasing your muscle strength.  Reducing stress and tension.  Improving your overall health. People with diabetes who exercise gain additional benefits because exercise:  Reduces appetite.  Improves the body's use of blood sugar (glucose).  Helps lower or control blood glucose.  Decreases blood pressure.  Helps control blood lipids (such as cholesterol and triglycerides).  Improves the body's use of the hormone insulin by:  Increasing the body's insulin sensitivity.  Reducing the body's insulin needs.  Decreases the risk for heart disease because exercising:  Lowers cholesterol and triglycerides levels.  Increases the levels of good cholesterol (such as high-density lipoproteins [HDL]) in the body.  Lowers blood glucose levels. YOUR ACTIVITY PLAN  Choose an activity that you enjoy and set realistic goals. Your health care provider or diabetes educator can help you make an activity plan that works for you. You can break activities into 2 or 3 sessions throughout the day. Doing so is as good as one long session. Exercise ideas include:  Taking the dog for a walk.  Taking the stairs instead of the elevator.  Dancing to your favorite song.  Doing your favorite exercise with a friend. RECOMMENDATIONS FOR EXERCISING WITH TYPE 1 OR TYPE 2 DIABETES   Check your blood glucose before exercising. If blood glucose levels are greater than 240 mg/dL, check for urine ketones. Do not exercise if ketones are present.  Avoid injecting insulin into areas of the body that are going to be exercised. For example, avoid injecting insulin into:  The arms when playing tennis.  The legs when jogging.  Keep a record of:  Food intake before and after you exercise.  Expected peak times of insulin action.  Blood glucose levels before and after you exercise.  The type and amount of exercise you have done.  Review  your records with your health care provider. Your health care provider will help you to develop guidelines for adjusting food intake and insulin amounts before and after exercising.  If you take insulin or oral hypoglycemic agents, watch for signs and symptoms of hypoglycemia. They include:  Dizziness.  Shaking.  Sweating.  Chills.  Confusion.  Drink plenty of water while you exercise to prevent dehydration or heat stroke. Body water is lost during exercise and must be replaced.  Talk to your health care provider before starting an exercise program to make sure it is safe for you. Remember, almost any type of activity is better than none. Document Released: 02/15/2004 Document Revised: 07/28/2013 Document Reviewed: 05/04/2013 [facility] Patient Information 2014 [place], [place].    Cholesterol Cholesterol is a white, waxy, fat-like protein needed by your body in small amounts. The liver makes all the cholesterol you need. It is carried from the liver by the blood through the blood vessels. Deposits (plaque) may build up on blood vessel walls. This makes the arteries narrower and stiffer. Plaque increases the risk for heart attack and stroke. You cannot feel your cholesterol level even if it is very high. The only way to know is by a blood test to check your  lipid (fats) levels. Once you know your cholesterol levels, you should keep a record of the test results. Work with your caregiver to to keep your levels in the desired range. WHAT THE RESULTS MEAN:  Total cholesterol is a rough measure of all the cholesterol in your blood.  LDL is the so-called bad cholesterol. This is the type that deposits cholesterol in the walls of the arteries. You want this level to be low.  HDL is the good cholesterol because it cleans the arteries and carries the LDL away. You want this level to be high.  Triglycerides are fat that the body can either burn for energy or store. High levels are closely linked  to heart disease. DESIRED LEVELS:  Total cholesterol below 200.  LDL below 100 for people at risk, below 70 for very high risk.  HDL above 50 is good, above 60 is best.  Triglycerides below 150. HOW TO LOWER YOUR CHOLESTEROL:  Diet.  Choose fish or white meat chicken and [place], roasted or baked. Limit fatty cuts of red meat, fried foods, and processed meats, such as sausage and lunch meat.  Eat lots of fresh fruits and vegetables. Choose whole grains, beans, pasta, potatoes and cereals.  Use only small amounts of olive, corn or canola oils. Avoid butter, mayonnaise, shortening or palm kernel oils. Avoid foods with trans-fats.  Use skim/nonfat milk and low-fat/nonfat yogurt and cheeses. Avoid whole milk, cream, ice cream, egg yolks and cheeses. Healthy desserts include angel food cake, ginger snaps, animal crackers, hard candy, popsicles, and low-fat/nonfat frozen yogurt. Avoid pastries, cakes, pies and cookies.  Exercise.  A regular program helps decrease LDL and raises HDL.  Helps with weight control.  Do things that increase your activity level like gardening, walking, or taking the stairs.  Medication.  May be prescribed by your caregiver to help lowering cholesterol and the risk for heart disease.  You may need medicine even if your levels are normal if you have several risk factors. HOME CARE INSTRUCTIONS   Follow your diet and exercise programs as suggested by your caregiver.  Take medications as directed.  Have blood work done when your caregiver feels it is necessary. MAKE SURE YOU:   Understand these instructions.  Will watch your condition.  Will get help right away if you are not doing well or get worse. Document Released: 08/20/2001 Document Revised: 02/17/2012 Document Reviewed: 02/10/2008 [facility] Patient Information 2014 [place], [place].

## 2013-10-20 LAB — URINALYSIS, MICROSCOPIC ONLY
Bacteria, UA: NONE SEEN
Casts: NONE SEEN
Crystals: NONE SEEN
Squamous Epithelial / LPF: NONE SEEN

## 2013-10-20 LAB — MICROALBUMIN / CREATININE URINE RATIO
Creatinine, Urine: 76.7 mg/dL
Microalb Creat Ratio: 16.6 mg/g (ref 0.0–30.0)

## 2013-10-20 LAB — INSULIN, FASTING: Insulin fasting, serum: 9 u[IU]/mL (ref 3–28)

## 2013-10-20 NOTE — Addendum Note (Signed)
Addended by: Lucky Cowboy on: 10/20/2013 08:28 AM   Modules accepted: Orders

## 2013-10-25 ENCOUNTER — Encounter: Payer: Self-pay | Admitting: Podiatry

## 2013-10-25 ENCOUNTER — Ambulatory Visit (INDEPENDENT_AMBULATORY_CARE_PROVIDER_SITE_OTHER): Payer: Medicare Other | Admitting: Podiatry

## 2013-10-25 VITALS — BP 138/67 | HR 75 | Resp 16 | Ht 63.0 in | Wt 150.0 lb

## 2013-10-25 DIAGNOSIS — Q828 Other specified congenital malformations of skin: Secondary | ICD-10-CM | POA: Diagnosis not present

## 2013-10-25 DIAGNOSIS — E1149 Type 2 diabetes mellitus with other diabetic neurological complication: Secondary | ICD-10-CM | POA: Diagnosis not present

## 2013-10-25 DIAGNOSIS — R609 Edema, unspecified: Secondary | ICD-10-CM | POA: Diagnosis not present

## 2013-10-25 NOTE — Progress Notes (Signed)
  Subjective:    Patient ID: [name], female    DOB: 08-29-1925, 79 y.o.   MRN: [id number]  HPI Comments: N piece of skin on toe , painful  L right great toe  D 6 months  O all of sudden  C worse  A walk  T soaks      Review of Systems  Constitutional: Positive for chills.  Respiratory: Positive for shortness of breath.   Endocrine: Positive for cold intolerance.       Excessive thirst   Genitourinary: Positive for urgency.  Musculoskeletal:       Joint pain  Difficulty walking   Neurological: Positive for dizziness and light-headedness.  Hematological: Bruises/bleeds easily.       Objective:   Physical Exam        Assessment & Plan:

## 2013-10-27 NOTE — Progress Notes (Signed)
Subjective:     Patient ID: [name], female   DOB: 1925-03-22, 79 y.o.   MRN: [id number]  HPI patient presents stating I'm having pain in his right big toe and I'm not sure why. States it's been hurting her for about 6 months and gradually has gotten worse   Review of Systems  All other systems reviewed and are negative.       Objective:   Physical Exam  Nursing note and vitals reviewed. Constitutional: She is oriented to person, place, and time.  Cardiovascular: Intact distal pulses.   Musculoskeletal: Normal range of motion.  Neurological: She is oriented to person, place, and time.  Skin: Skin is warm.   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 Today debridement of lesion accomplished with padding and cushion. May require other treatments if symptoms do not improve and she is encouraged to come back if that were to occur

## 2013-11-02 DIAGNOSIS — R3989 Other symptoms and signs involving the genitourinary system: Secondary | ICD-10-CM | POA: Diagnosis not present

## 2013-11-02 DIAGNOSIS — N3000 Acute cystitis without hematuria: Secondary | ICD-10-CM | POA: Diagnosis not present

## 2013-11-10 DIAGNOSIS — E785 Hyperlipidemia, unspecified: Secondary | ICD-10-CM | POA: Diagnosis not present

## 2013-11-10 DIAGNOSIS — R0602 Shortness of breath: Secondary | ICD-10-CM | POA: Diagnosis not present

## 2013-11-10 DIAGNOSIS — I428 Other cardiomyopathies: Secondary | ICD-10-CM | POA: Diagnosis not present

## 2013-11-10 DIAGNOSIS — I509 Heart failure, unspecified: Secondary | ICD-10-CM | POA: Diagnosis not present

## 2013-11-10 DIAGNOSIS — I6529 Occlusion and stenosis of unspecified carotid artery: Secondary | ICD-10-CM | POA: Diagnosis not present

## 2013-11-10 DIAGNOSIS — I951 Orthostatic hypotension: Secondary | ICD-10-CM | POA: Diagnosis not present

## 2013-11-10 DIAGNOSIS — Z8673 Personal history of transient ischemic attack (TIA), and cerebral infarction without residual deficits: Secondary | ICD-10-CM | POA: Diagnosis not present

## 2013-11-10 DIAGNOSIS — I10 Essential (primary) hypertension: Secondary | ICD-10-CM | POA: Diagnosis not present

## 2013-11-17 DIAGNOSIS — I428 Other cardiomyopathies: Secondary | ICD-10-CM | POA: Diagnosis not present

## 2013-11-17 DIAGNOSIS — I10 Essential (primary) hypertension: Secondary | ICD-10-CM | POA: Diagnosis not present

## 2013-11-17 DIAGNOSIS — I951 Orthostatic hypotension: Secondary | ICD-10-CM | POA: Diagnosis not present

## 2013-11-17 DIAGNOSIS — E785 Hyperlipidemia, unspecified: Secondary | ICD-10-CM | POA: Diagnosis not present

## 2013-11-17 DIAGNOSIS — Z8673 Personal history of transient ischemic attack (TIA), and cerebral infarction without residual deficits: Secondary | ICD-10-CM | POA: Diagnosis not present

## 2013-11-17 DIAGNOSIS — I6529 Occlusion and stenosis of unspecified carotid artery: Secondary | ICD-10-CM | POA: Diagnosis not present

## 2013-11-17 DIAGNOSIS — Z950 Presence of cardiac pacemaker: Secondary | ICD-10-CM | POA: Diagnosis not present

## 2013-11-17 DIAGNOSIS — I509 Heart failure, unspecified: Secondary | ICD-10-CM | POA: Diagnosis not present

## 2013-11-17 DIAGNOSIS — R0602 Shortness of breath: Secondary | ICD-10-CM | POA: Diagnosis not present

## 2014-01-17 ENCOUNTER — Encounter: Payer: Self-pay | Admitting: Physician Assistant

## 2014-01-17 ENCOUNTER — Ambulatory Visit (INDEPENDENT_AMBULATORY_CARE_PROVIDER_SITE_OTHER): Payer: Medicare Other | Admitting: Physician Assistant

## 2014-01-17 VITALS — BP 122/62 | HR 72 | Temp 98.1°F | Resp 16 | Ht 63.0 in | Wt 148.0 lb

## 2014-01-17 DIAGNOSIS — E785 Hyperlipidemia, unspecified: Secondary | ICD-10-CM

## 2014-01-17 DIAGNOSIS — E782 Mixed hyperlipidemia: Secondary | ICD-10-CM

## 2014-01-17 DIAGNOSIS — E1142 Type 2 diabetes mellitus with diabetic polyneuropathy: Secondary | ICD-10-CM | POA: Diagnosis not present

## 2014-01-17 DIAGNOSIS — E1149 Type 2 diabetes mellitus with other diabetic neurological complication: Secondary | ICD-10-CM

## 2014-01-17 DIAGNOSIS — I10 Essential (primary) hypertension: Secondary | ICD-10-CM | POA: Diagnosis not present

## 2014-01-17 DIAGNOSIS — Z79899 Other long term (current) drug therapy: Secondary | ICD-10-CM | POA: Diagnosis not present

## 2014-01-17 DIAGNOSIS — E559 Vitamin D deficiency, unspecified: Secondary | ICD-10-CM | POA: Diagnosis not present

## 2014-01-17 DIAGNOSIS — E1140 Type 2 diabetes mellitus with diabetic neuropathy, unspecified: Secondary | ICD-10-CM

## 2014-01-17 LAB — CBC WITH DIFFERENTIAL/PLATELET
Basophils Absolute: 0.1 10*3/uL (ref 0.0–0.1)
Basophils Relative: 1 % (ref 0–1)
EOS ABS: 0.2 10*3/uL (ref 0.0–0.7)
EOS PCT: 3 % (ref 0–5)
HCT: 38.9 % (ref 36.0–46.0)
Hemoglobin: 13.5 g/dL (ref 12.0–15.0)
LYMPHS ABS: 2.3 10*3/uL (ref 0.7–4.0)
Lymphocytes Relative: 27 % (ref 12–46)
MCH: 31.1 pg (ref 26.0–34.0)
MCHC: 34.7 g/dL (ref 30.0–36.0)
MCV: 89.6 fL (ref 78.0–100.0)
MONOS PCT: 7 % (ref 3–12)
Monocytes Absolute: 0.6 10*3/uL (ref 0.1–1.0)
Neutro Abs: 5.4 10*3/uL (ref 1.7–7.7)
Neutrophils Relative %: 62 % (ref 43–77)
Platelets: 264 10*3/uL (ref 150–400)
RBC: 4.34 MIL/uL (ref 3.87–5.11)
RDW: 13.6 % (ref 11.5–15.5)
WBC: 8.6 10*3/uL (ref 4.0–10.5)

## 2014-01-17 LAB — HEPATIC FUNCTION PANEL
ALBUMIN: 4.1 g/dL (ref 3.5–5.2)
ALT: 16 U/L (ref 0–35)
AST: 20 U/L (ref 0–37)
Alkaline Phosphatase: 59 U/L (ref 39–117)
Bilirubin, Direct: 0.1 mg/dL (ref 0.0–0.3)
Indirect Bilirubin: 0.5 mg/dL (ref 0.2–1.2)
TOTAL PROTEIN: 6.3 g/dL (ref 6.0–8.3)
Total Bilirubin: 0.6 mg/dL (ref 0.2–1.2)

## 2014-01-17 LAB — LIPID PANEL
CHOLESTEROL: 151 mg/dL (ref 0–200)
HDL: 57 mg/dL (ref >39)
LDL Cholesterol: 60 mg/dL (ref 0–99)
Total CHOL/HDL Ratio: 2.6 Ratio
Triglycerides: 171 mg/dL — ABNORMAL HIGH (ref <150)
VLDL: 34 mg/dL (ref 0–40)

## 2014-01-17 LAB — BASIC METABOLIC PANEL WITH GFR
BUN: 20 mg/dL (ref 6–23)
CALCIUM: 9.5 mg/dL (ref 8.4–10.5)
CO2: 28 meq/L (ref 19–32)
CREATININE: 1.23 mg/dL — AB (ref 0.50–1.10)
Chloride: 103 mEq/L (ref 96–112)
GFR, Est African American: 46 mL/min — ABNORMAL LOW
GFR, Est Non African American: 40 mL/min — ABNORMAL LOW
GLUCOSE: 112 mg/dL — AB (ref 70–99)
Potassium: 4.8 mEq/L (ref 3.5–5.3)
Sodium: 143 mEq/L (ref 135–145)

## 2014-01-17 LAB — HEMOGLOBIN A1C
Hgb A1c MFr Bld: 5.9 % — ABNORMAL HIGH (ref <5.7)
Mean Plasma Glucose: 123 mg/dL — ABNORMAL HIGH (ref <117)

## 2014-01-17 LAB — TSH: TSH: 1.99 u[IU]/mL (ref 0.350–4.500)

## 2014-01-17 LAB — MAGNESIUM: Magnesium: 2 mg/dL (ref 1.5–2.5)

## 2014-01-17 MED ORDER — GABAPENTIN 300 MG PO CAPS: 300.0000 mg | ORAL_CAPSULE | Freq: Two times a day (BID) | ORAL | Status: DC

## 2014-01-17 MED ORDER — PANTOPRAZOLE SODIUM 40 MG PO TBEC: 40.0000 mg | DELAYED_RELEASE_TABLET | Freq: Every day | ORAL | Status: DC

## 2014-01-17 NOTE — Progress Notes (Signed)
HPI 80 y.o. female  presents for 3 month follow up with hypertension, hyperlipidemia, diabetes and vitamin D. Her blood pressure has been controlled at home, today their BP is BP: 122/62 mmHg She denies chest pain, shortness of breath, dizziness.  Her cholesterol is diet controlled. In addition they are on lipitor and denies myalgias. Her cholesterol is at goal. The cholesterol last visit was:   Lab Results  Component Value Date   CHOL 174 10/19/2013   HDL 47 10/19/2013   LDLCALC 53 10/19/2013   TRIG 369* 10/19/2013   CHOLHDL 3.7 10/19/2013   She has been working on diet and exercise for Diabetes, and denies blurry vision, polydipsia, polyphagia and polyuria. Her sugars have been running 80-120's. Last A1C in the office was:  Lab Results  Component Value Date   HGBA1C 5.9* 10/19/2013   Patient is on Vitamin D supplement.   She states that while she eats in the morning she has trouble swallowing and sometimes feels very anxious.  She has RLS and she states her legs have been worse at night, she only takes one gabapentin at night.  Current Medications:  Current Outpatient Prescriptions on File Prior to Visit  Medication Sig Dispense Refill  . atorvastatin (LIPITOR) 80 MG tablet Take 1 tablet (80 mg total) by mouth daily.  30 tablet  11  . b complex vitamins capsule Take 1 capsule by mouth daily.       . bumetanide (BUMEX) 1 MG tablet Take 0.5 mg by mouth daily.      . carvedilol (COREG) 6.25 MG tablet Take 6.25 mg by mouth 2 (two) times daily with a meal.      . Cholecalciferol (VITAMIN D3) 1000 UNITS CAPS Take 1,000-2,000 Units by mouth daily. Take 1000mg  on Monday, Wednesday, and Friday. Takes 2000mg  all other days      . clopidogrel (PLAVIX) 75 MG tablet Take 1 tablet (75 mg total) by mouth daily with breakfast.  30 tablet  0  . gabapentin (NEURONTIN) 300 MG capsule Take 300 mg by mouth at bedtime.      . Glycerin, Laxative, (ADULT SUPPOSITORY RE) Place rectally. Valium vaginal  suppository    1 bid      . losartan (COZAAR) 50 MG tablet Take 25 mg by mouth daily.       . magnesium gluconate (MAGONATE) 500 MG tablet Take 500 mg by mouth daily.       . ranitidine (ZANTAC) 300 MG tablet Take 300 mg by mouth every morning.       No current facility-administered medications on file prior to visit.   Medical History:  Past Medical History  Diagnosis Date  . Secondary cardiomyopathy, unspecified   . Occlusion and stenosis of carotid artery without mention of cerebral infarction   . GERD (gastroesophageal reflux disease)   . Diabetes mellitus without complication   . A-fib   . Biventricular cardiac pacemaker in situ     05/06/13  Old AICD explanted and 6949 lead capped  RV lead  Medtronic 5076 [id number]  LV lead  Medtronic 4194  [id number] V Medtronic biventricular pacer  PVX [id number] S   . Hyperlipidemia   . Peripheral neuropathy   . History of esophageal stricture   . History of duodenal ulcer   . Transient global amnesia   . Hypertension    Allergies:  Allergies  Allergen Reactions  . Nexium [Esomeprazole Magnesium]     Diarrhea    ROS Constitutional: Denies fever, chills, headaches, insomnia, fatigue, night  sweats Eyes: Denies redness, blurred vision, diplopia, discharge, itchy, watery eyes.  ENT: Denies congestion, post nasal drip, sore throat, earache, dental pain, Tinnitus, Vertigo, Sinus pain, snoring.  Cardio: Denies chest pain, palpitations, irregular heartbeat, dyspnea, diaphoresis, orthopnea, PND, claudication, edema Respiratory: denies cough, shortness of breath, wheezing.  Gastrointestinal: + GERD/dysphagia Denies AB pain/ cramps, N/V, diarrhea, constipation, hematemesis, melena, hematochezia,  hemorrhoids Genitourinary: Denies dysuria, frequency, urgency, nocturia, hesitancy, discharge, hematuria, flank pain Musculoskeletal: Denies myalgia, stiffness, pain, swelling and strain/sprain. Skin: Denies pruritis, rash, changing in skin lesion Neuro: +  Paraesthesia Denies Weakness, tremor, incoordination, spasms, pain Psychiatric: Denies confusion, memory loss, sensory loss Endocrine: Denies change in weight, skin, hair change, nocturia Diabetic Polys, Denies visual blurring, hyper /hypo glycemic episodes Heme/Lymph: Denies Excessive bleeding, bruising, enlarged lymph nodes  Family history- Review and unchanged Social history- Review and unchanged Physical Exam: Filed Vitals:   01/17/14 0934  BP: 122/62  Pulse: 72  Temp: 98.1 F (36.7 C)  Resp: 16   Filed Weights   01/17/14 0934  Weight: 148 lb (67.132 kg)   General Appearance: Well nourished, in no apparent distress. Eyes: PERRLA, EOMs, conjunctiva no swelling or erythema Sinuses: No Frontal/maxillary tenderness ENT/Mouth: Ext aud canals clear, TMs without erythema, bulging. No erythema, swelling, or exudate on post pharynx.  Tonsils not swollen or erythematous. Hearing normal.  Neck: Supple, thyroid normal.  Respiratory: Respiratory effort normal, BS equal bilaterally without rales, rhonchi, wheezing or stridor.  Cardio: RRR with no MRGs. Brisk peripheral pulses without edema.  Abdomen: Soft, + BS.  Non tender, no guarding, rebound, hernias, masses. Lymphatics: Non tender without lymphadenopathy.  Musculoskeletal: Full ROM, 5/5 strength, normal gait.  Skin: Warm, dry without rashes, lesions, ecchymosis.  Neuro: Cranial nerves intact. No cerebellar symptoms.  Psych: Awake and oriented X 3, normal affect, Insight and Judgment appropriate.   Assessment and Plan:  Hypertension: Continue medication, monitor blood pressure at home. Continue DASH diet. Cholesterol: Continue diet and exercise. Check cholesterol.  Diabetes-Continue diet and exercise. Check A1C Vitamin D Def- check level and continue medications.  RLS- increase Gabepatin to BID- normal pulses, if not better follow up in OV GERD- get on Protonix, if not better refer GI.   Continue diet and meds as discussed.  Further disposition pending results of labs. Discussed med's effects and SE's.  OVER 40 minutes of exam, counseling, chart review, referral performed    Vicie Mutters 9:50 AM

## 2014-01-17 NOTE — Patient Instructions (Signed)
Try the Zantac twice a day for 2 weeks, then if that does not help then take the protonix once a day for 2 weeks to see if it helps.   Diet for Gastroesophageal Reflux Disease, Adult Reflux (acid reflux) is when acid from your stomach flows up into the esophagus. When acid comes in contact with the esophagus, the acid causes irritation and soreness (inflammation) in the esophagus. When reflux happens often or so severely that it causes damage to the esophagus, it is called gastroesophageal reflux disease (GERD). Nutrition therapy can help ease the discomfort of GERD. FOODS OR DRINKS TO AVOID OR LIMIT  Smoking or chewing tobacco. Nicotine is one of the most potent stimulants to acid production in the gastrointestinal tract.  Caffeinated and decaffeinated coffee and black tea.  Regular or low-calorie carbonated beverages or energy drinks (caffeine-free carbonated beverages are allowed).   Strong spices, such as black pepper, white pepper, red pepper, cayenne, curry powder, and chili powder.  Peppermint or spearmint.  Chocolate.  High-fat foods, including meats and fried foods. Extra added fats including oils, butter, salad dressings, and nuts. Limit these to less than 8 tsp per day.  Fruits and vegetables if they are not tolerated, such as citrus fruits or tomatoes.  Alcohol.  Any food that seems to aggravate your condition. If you have questions regarding your diet, call your caregiver or a registered dietitian. OTHER THINGS THAT MAY HELP GERD INCLUDE:   Eating your meals slowly, in a relaxed setting.  Eating 5 to 6 small meals per day instead of 3 large meals.  Eliminating food for a period of time if it causes distress.  Not lying down until 3 hours after eating a meal.  Keeping the head of your bed raised 6 to 9 inches (15 to 23 cm) by using a foam wedge or blocks under the legs of the bed. Lying flat may make symptoms worse.  Being physically active. Weight loss may be  helpful in reducing reflux in overweight or obese adults.  Wear loose fitting clothing EXAMPLE MEAL PLAN This meal plan is approximately 2,000 calories based on [email or web address] meal planning guidelines. Breakfast   cup cooked oatmeal.  1 cup strawberries.  1 cup low-fat milk.  1 oz almonds. Snack  1 cup cucumber slices.  6 oz yogurt (made from low-fat or fat-free milk). Lunch  2 slice whole-wheat bread.  2 oz sliced [place].  2 tsp mayonnaise.  1 cup blueberries.  1 cup snap peas. Snack  6 whole-wheat crackers.  1 oz string cheese. Dinner   cup brown rice.  1 cup mixed veggies.  1 tsp olive oil.  3 oz grilled fish. Document Released: 11/25/2005 Document Revised: 02/17/2012 Document Reviewed: 10/11/2011 [facility] Patient Information 2014 [place], [place].    Bad carbs also include fruit juice, alcohol, and sweet tea. These are empty calories that do not signal to your brain that you are full.   Please remember the good carbs are still carbs which convert into sugar. So please measure them out no more than 1/2-1 cup of rice, oatmeal, pasta, and beans.  Veggies are however free foods! Pile them on.   I like lean protein at every meal such as chicken, [place], pork chops, cottage cheese, etc. Just do not fry these meats and please center your meal around vegetable, the meats should be a side dish.   No all fruit is created equal. Please see the list below, the fruit at the bottom is higher in sugars  than the fruit at the top

## 2014-02-11 DIAGNOSIS — I6529 Occlusion and stenosis of unspecified carotid artery: Secondary | ICD-10-CM | POA: Diagnosis not present

## 2014-02-11 DIAGNOSIS — I951 Orthostatic hypotension: Secondary | ICD-10-CM | POA: Diagnosis not present

## 2014-02-11 DIAGNOSIS — I428 Other cardiomyopathies: Secondary | ICD-10-CM | POA: Diagnosis not present

## 2014-02-11 DIAGNOSIS — I509 Heart failure, unspecified: Secondary | ICD-10-CM | POA: Diagnosis not present

## 2014-02-11 DIAGNOSIS — I10 Essential (primary) hypertension: Secondary | ICD-10-CM | POA: Diagnosis not present

## 2014-02-11 DIAGNOSIS — R0602 Shortness of breath: Secondary | ICD-10-CM | POA: Diagnosis not present

## 2014-02-11 DIAGNOSIS — E785 Hyperlipidemia, unspecified: Secondary | ICD-10-CM | POA: Diagnosis not present

## 2014-02-11 DIAGNOSIS — Z8673 Personal history of transient ischemic attack (TIA), and cerebral infarction without residual deficits: Secondary | ICD-10-CM | POA: Diagnosis not present

## 2014-02-16 DIAGNOSIS — I509 Heart failure, unspecified: Secondary | ICD-10-CM | POA: Diagnosis not present

## 2014-02-16 DIAGNOSIS — I10 Essential (primary) hypertension: Secondary | ICD-10-CM | POA: Diagnosis not present

## 2014-02-16 DIAGNOSIS — E785 Hyperlipidemia, unspecified: Secondary | ICD-10-CM | POA: Diagnosis not present

## 2014-02-16 DIAGNOSIS — R0602 Shortness of breath: Secondary | ICD-10-CM | POA: Diagnosis not present

## 2014-02-16 DIAGNOSIS — I951 Orthostatic hypotension: Secondary | ICD-10-CM | POA: Diagnosis not present

## 2014-02-16 DIAGNOSIS — Z8673 Personal history of transient ischemic attack (TIA), and cerebral infarction without residual deficits: Secondary | ICD-10-CM | POA: Diagnosis not present

## 2014-02-16 DIAGNOSIS — I428 Other cardiomyopathies: Secondary | ICD-10-CM | POA: Diagnosis not present

## 2014-02-16 DIAGNOSIS — Z95810 Presence of automatic (implantable) cardiac defibrillator: Secondary | ICD-10-CM | POA: Diagnosis not present

## 2014-02-16 DIAGNOSIS — I6529 Occlusion and stenosis of unspecified carotid artery: Secondary | ICD-10-CM | POA: Diagnosis not present

## 2014-03-24 ENCOUNTER — Ambulatory Visit (INDEPENDENT_AMBULATORY_CARE_PROVIDER_SITE_OTHER): Payer: Medicare Other | Admitting: Podiatry

## 2014-03-24 ENCOUNTER — Encounter: Payer: Self-pay | Admitting: Podiatry

## 2014-03-24 VITALS — BP 161/67 | HR 65 | Resp 16

## 2014-03-24 DIAGNOSIS — L84 Corns and callosities: Secondary | ICD-10-CM

## 2014-03-24 NOTE — Progress Notes (Signed)
Subjective:     Patient ID: [name], female   DOB: 1925/01/07, 80 y.o.   MRN: [id number]  HPI patient presents with keratotic lesion right hallux which has become thick and sore again and has not been worked on for approximately 8 months   Review of Systems     Objective:   Physical Exam Neurovascular status intact no changes in health history with large keratotic lesion sub-hallux right foot that's painful when pressed    Assessment:     Keratotic lesion and pain right big toe    Plan:     Debridement lesion and no bleeding noted and applied dressing to the area

## 2014-04-12 DIAGNOSIS — N952 Postmenopausal atrophic vaginitis: Secondary | ICD-10-CM | POA: Diagnosis not present

## 2014-04-12 DIAGNOSIS — R350 Frequency of micturition: Secondary | ICD-10-CM | POA: Diagnosis not present

## 2014-04-18 DIAGNOSIS — Z1231 Encounter for screening mammogram for malignant neoplasm of breast: Secondary | ICD-10-CM | POA: Diagnosis not present

## 2014-04-20 ENCOUNTER — Ambulatory Visit: Payer: Self-pay | Admitting: Internal Medicine

## 2014-04-21 ENCOUNTER — Ambulatory Visit: Payer: Self-pay | Admitting: Internal Medicine

## 2014-04-22 ENCOUNTER — Other Ambulatory Visit: Payer: Self-pay | Admitting: Internal Medicine

## 2014-05-20 ENCOUNTER — Encounter: Payer: Self-pay | Admitting: Internal Medicine

## 2014-05-20 ENCOUNTER — Ambulatory Visit (INDEPENDENT_AMBULATORY_CARE_PROVIDER_SITE_OTHER): Payer: Medicare Other | Admitting: Internal Medicine

## 2014-05-20 VITALS — BP 122/60 | HR 60 | Temp 98.1°F | Resp 16 | Ht 63.0 in | Wt 152.2 lb

## 2014-05-20 DIAGNOSIS — Z79899 Other long term (current) drug therapy: Secondary | ICD-10-CM | POA: Diagnosis not present

## 2014-05-20 DIAGNOSIS — E1149 Type 2 diabetes mellitus with other diabetic neurological complication: Secondary | ICD-10-CM

## 2014-05-20 DIAGNOSIS — E559 Vitamin D deficiency, unspecified: Secondary | ICD-10-CM

## 2014-05-20 DIAGNOSIS — E785 Hyperlipidemia, unspecified: Secondary | ICD-10-CM

## 2014-05-20 DIAGNOSIS — I10 Essential (primary) hypertension: Secondary | ICD-10-CM | POA: Diagnosis not present

## 2014-05-20 DIAGNOSIS — R7309 Other abnormal glucose: Secondary | ICD-10-CM

## 2014-05-20 DIAGNOSIS — E1140 Type 2 diabetes mellitus with diabetic neuropathy, unspecified: Secondary | ICD-10-CM

## 2014-05-20 DIAGNOSIS — E1142 Type 2 diabetes mellitus with diabetic polyneuropathy: Secondary | ICD-10-CM | POA: Diagnosis not present

## 2014-05-20 LAB — BASIC METABOLIC PANEL WITH GFR
BUN: 17 mg/dL (ref 6–23)
CALCIUM: 9.2 mg/dL (ref 8.4–10.5)
CO2: 27 mEq/L (ref 19–32)
Chloride: 103 mEq/L (ref 96–112)
Creat: 1.41 mg/dL — ABNORMAL HIGH (ref 0.50–1.10)
GFR, Est African American: 39 mL/min — ABNORMAL LOW
GFR, Est Non African American: 34 mL/min — ABNORMAL LOW
GLUCOSE: 106 mg/dL — AB (ref 70–99)
Potassium: 4.4 mEq/L (ref 3.5–5.3)
SODIUM: 142 meq/L (ref 135–145)

## 2014-05-20 LAB — CBC WITH DIFFERENTIAL/PLATELET
BASOS PCT: 1 % (ref 0–1)
Basophils Absolute: 0.1 10*3/uL (ref 0.0–0.1)
EOS ABS: 0.2 10*3/uL (ref 0.0–0.7)
Eosinophils Relative: 3 % (ref 0–5)
HEMATOCRIT: 36.7 % (ref 36.0–46.0)
HEMOGLOBIN: 13.2 g/dL (ref 12.0–15.0)
Lymphocytes Relative: 30 % (ref 12–46)
Lymphs Abs: 2.1 10*3/uL (ref 0.7–4.0)
MCH: 32.2 pg (ref 26.0–34.0)
MCHC: 36 g/dL (ref 30.0–36.0)
MCV: 89.5 fL (ref 78.0–100.0)
MONO ABS: 0.4 10*3/uL (ref 0.1–1.0)
MONOS PCT: 6 % (ref 3–12)
Neutro Abs: 4.3 10*3/uL (ref 1.7–7.7)
Neutrophils Relative %: 60 % (ref 43–77)
Platelets: 256 10*3/uL (ref 150–400)
RBC: 4.1 MIL/uL (ref 3.87–5.11)
RDW: 12.7 % (ref 11.5–15.5)
WBC: 7.1 10*3/uL (ref 4.0–10.5)

## 2014-05-20 LAB — HEPATIC FUNCTION PANEL
ALT: 13 U/L (ref 0–35)
AST: 20 U/L (ref 0–37)
Albumin: 4.1 g/dL (ref 3.5–5.2)
Alkaline Phosphatase: 52 U/L (ref 39–117)
BILIRUBIN DIRECT: 0.1 mg/dL (ref 0.0–0.3)
BILIRUBIN INDIRECT: 0.5 mg/dL (ref 0.2–1.2)
Total Bilirubin: 0.6 mg/dL (ref 0.2–1.2)
Total Protein: 6.3 g/dL (ref 6.0–8.3)

## 2014-05-20 LAB — LIPID PANEL
CHOL/HDL RATIO: 2.6 ratio
Cholesterol: 157 mg/dL (ref 0–200)
HDL: 61 mg/dL (ref >39)
LDL Cholesterol: 73 mg/dL (ref 0–99)
Triglycerides: 117 mg/dL (ref <150)
VLDL: 23 mg/dL (ref 0–40)

## 2014-05-20 LAB — VITAMIN D 25 HYDROXY (VIT D DEFICIENCY, FRACTURES): Vit D, 25-Hydroxy: 72 ng/mL (ref 30–89)

## 2014-05-20 LAB — HEMOGLOBIN A1C
HEMOGLOBIN A1C: 6 % — AB (ref <5.7)
Mean Plasma Glucose: 126 mg/dL — ABNORMAL HIGH (ref <117)

## 2014-05-20 LAB — MAGNESIUM: MAGNESIUM: 1.9 mg/dL (ref 1.5–2.5)

## 2014-05-20 LAB — TSH: TSH: 2.404 u[IU]/mL (ref 0.350–4.500)

## 2014-05-20 NOTE — Progress Notes (Signed)
Patient ID: [name], female   DOB: 05-26-1925, 80 y.o.   MRN: [id number]    This very nice 80 y.o.WWF presents for 3 month follow up with Hypertension, Hyperlipidemia, Pre-Diabetes and Vitamin D Deficiency.    HTN predates since 57. BP has been controlled at home. Today's BP: 122/60 mmHg. Patient had a permanent pacemaker in 2008 and a changeout to AICD/Defib in May 2014. Patient had a negative Stress Myoview in 2010. Patient denies any cardiac type chest pain, palpitations, dyspnea/orthopnea/PND, dizziness, claudication, or dependent edema.   Hyperlipidemia is controlled with diet & meds. Last Lipids as below have been at goal. Patient denies myalgias or other med SE's.  Lab Results  Component Value Date   CHOL 157 05/20/2014   CHOL 151 01/17/2014   CHOL 174 10/19/2013   Lab Results  Component Value Date   HDL 61 05/20/2014   HDL 57 01/17/2014   HDL 47 10/19/2013   Lab Results  Component Value Date   LDLCALC 73 05/20/2014   LDLCALC 60 01/17/2014   LDLCALC 53 10/19/2013   Lab Results  Component Value Date   TRIG 117 05/20/2014   TRIG 171* 01/17/2014   TRIG 369* 10/19/2013   Lab Results  Component Value Date   CHOLHDL 2.6 05/20/2014   CHOLHDL 2.6 01/17/2014   CHOLHDL 3.7 10/19/2013    Also, the patient has history of PreDiabetes/insulin resistance since 2012 with last A1c was 5.9% in Feb 2015. Marland Kitchen Patient denies any symptoms of reactive hypoglycemia, diabetic polys, paresthesias or visual blurring.   Further, Patient has history of Vitamin D Deficiency of 33 in 2008 and last vitamin D was 55 in Nov 2014. Patient supplements vitamin D without any suspected side-effects.    Medication List   atorvastatin 80 MG tablet  Commonly known as:  LIPITOR  Take 1 tablet (80 mg total) by mouth daily.     b complex vitamins capsule  Take 1 capsule by mouth daily.     bumetanide 1 MG tablet  Commonly known as:  BUMEX  Take 0.5 mg by mouth daily.     carvedilol 6.25 MG tablet  Commonly  known as:  COREG  Take 6.25 mg by mouth 2 (two) times daily with a meal.     clopidogrel 75 MG tablet  Commonly known as:  PLAVIX  Take 1 tablet (75 mg total) by mouth daily with breakfast.     gabapentin 300 MG capsule  Commonly known as:  NEURONTIN  Take 1 capsule (300 mg total) by mouth 2 (two) times daily.     gabapentin 600 MG tablet  Commonly known as:  NEURONTIN  TAKE 1 TABLET BY MOUTH AT BEDTIME FOR NEUROPATHY     losartan 50 MG tablet  Commonly known as:  COZAAR  Take 25 mg by mouth daily.     magnesium gluconate 500 MG tablet  Commonly known as:  MAGONATE  Take 500 mg by mouth daily.     pantoprazole 40 MG tablet  Commonly known as:  PROTONIX  Take 1 tablet (40 mg total) by mouth daily.     ranitidine 300 MG tablet  Commonly known as:  ZANTAC  Take 300 mg by mouth every morning.     Vitamin D3 1000 UNITS Caps  Take 1,000-2,000 Units by mouth daily. Take 1000mg  on Monday, Wednesday, and Friday. Takes 2000mg  all other days     Allergies  Allergen Reactions  . Nexium [Esomeprazole Magnesium]     Diarrhea  PMHx:   Past Medical History  Diagnosis Date  . Secondary cardiomyopathy, unspecified   . Occlusion and stenosis of carotid artery without mention of cerebral infarction   . GERD (gastroesophageal reflux disease)   . Diabetes mellitus without complication   . A-fib   . Biventricular cardiac pacemaker in situ     05/06/13  Old AICD explanted and 6949 lead capped  RV lead  Medtronic 5076 [id number]  LV lead  Medtronic 4194  [id number] V Medtronic biventricular pacer  PVX [id number] S   . Hyperlipidemia   . Peripheral neuropathy   . History of esophageal stricture   . History of duodenal ulcer   . Transient global amnesia   . Hypertension    FHx:    Reviewed / unchanged  SHx:    Reviewed / unchanged   Systems Review: Constitutional: Denies fever, chills, wt changes, headaches, insomnia, fatigue, night sweats, change in appetite. Eyes: Denies redness,  blurred vision, diplopia, discharge, itchy, watery eyes.  ENT: Denies discharge, congestion, post nasal drip, epistaxis, sore throat, earache, hearing loss, dental pain, tinnitus, vertigo, sinus pain, snoring.  CV: Denies chest pain, palpitations, irregular heartbeat, syncope, dyspnea, diaphoresis, orthopnea, PND, claudication or edema. Respiratory: denies cough, dyspnea, DOE, pleurisy, hoarseness, laryngitis, wheezing.  Gastrointestinal: Denies dysphagia, odynophagia, heartburn, reflux, water brash, abdominal pain or cramps, nausea, vomiting, bloating, diarrhea, constipation, hematemesis, melena, hematochezia  or hemorrhoids. Genitourinary: Denies dysuria, frequency, urgency, nocturia, hesitancy, discharge, hematuria or flank pain. Musculoskeletal: Denies arthralgias, myalgias, stiffness, jt. swelling, pain, limping or strain/sprain.  Skin: Denies pruritus, rash, hives, warts, acne, eczema or change in skin lesion(s). Neuro: No weakness, tremor, incoordination, spasms, paresthesia or pain. Psychiatric: Denies confusion, memory loss or sensory loss. Endo: Denies change in weight, skin or hair change.  Heme/Lymph: No excessive bleeding, bruising or enlarged lymph nodes.   Exam:  BP 122/60  Pulse 60  Temp 98.1 F  Resp 16  H  Wt 152 lb 3.2 oz   BMI 26.97 kg/m2  Appears well nourished - in no distress. Eyes: PERRLA, EOMs, conjunctiva no swelling or erythema. Sinuses: No frontal/maxillary tenderness ENT/Mouth: EAC's clear, TM's nl w/o erythema, bulging. Nares clear w/o erythema, swelling, exudates. Oropharynx clear without erythema or exudates. Oral hygiene is good. Tongue normal, non obstructing. Hearing intact.  Neck: Supple. Thyroid nl. Car 2+/2+ without bruits, nodes or JVD. Chest: Respirations nl with BS clear & equal w/o rales, rhonchi, wheezing or stridor.  Cor: Heart sounds normal w/ regular rate and rhythm without sig. murmurs, gallops, clicks, or rubs. Peripheral pulses normal and  equal  without edema.  Abdomen: Soft & bowel sounds normal. Non-tender w/o guarding, rebound, hernias, masses, or organomegaly.  Lymphatics: Unremarkable.  Musculoskeletal: Full ROM all peripheral extremities, joint stability, 5/5 strength, and normal gait.  Skin: Warm, dry without exposed rashes, lesions or ecchymosis apparent.  Neuro: Cranial nerves intact, reflexes equal bilaterally. Sensory-motor testing grossly intact. Tendon reflexes grossly intact.  Pysch: Alert & oriented x 3. Insight and judgement nl & appropriate. No ideations.  Assessment and Plan:  1. Hypertension - Continue monitor blood pressure at home. Continue diet/meds same.  2. Hyperlipidemia - Continue diet/meds, exercise,& lifestyle modifications. Continue monitor periodic cholesterol/liver & renal functions   3. Pre-diabetes/Insulin Resistance - Continue diet, exercise, lifestyle modifications. Monitor appropriate labs.  3. Diabetes - continue recommend prudent low glycemic diet, weight control, regular exercise, diabetic monitoring and periodic eye exams.  4. Vitamin D Deficiency - Continue supplementation.  Recommended regular exercise, BP monitoring, weight control, and  discussed med and SE's. Recommended labs to assess and monitor clinical status. Further disposition pending results of labs.

## 2014-05-21 DIAGNOSIS — R7303 Prediabetes: Secondary | ICD-10-CM | POA: Insufficient documentation

## 2014-05-21 LAB — INSULIN, FASTING: INSULIN FASTING, SERUM: 11 u[IU]/mL (ref 3–28)

## 2014-05-23 ENCOUNTER — Other Ambulatory Visit: Payer: Self-pay | Admitting: *Deleted

## 2014-05-23 DIAGNOSIS — Z1211 Encounter for screening for malignant neoplasm of colon: Secondary | ICD-10-CM | POA: Diagnosis not present

## 2014-06-06 ENCOUNTER — Encounter (HOSPITAL_COMMUNITY): Payer: Self-pay | Admitting: Emergency Medicine

## 2014-06-06 ENCOUNTER — Emergency Department (HOSPITAL_COMMUNITY): Payer: Medicare Other

## 2014-06-06 ENCOUNTER — Emergency Department (HOSPITAL_COMMUNITY)
Admission: EM | Admit: 2014-06-06 | Discharge: 2014-06-06 | Disposition: A | Discharge status: Home / Self Care | Payer: Medicare Other | Attending: Emergency Medicine | Admitting: Emergency Medicine

## 2014-06-06 DIAGNOSIS — Z79899 Other long term (current) drug therapy: Secondary | ICD-10-CM | POA: Diagnosis not present

## 2014-06-06 DIAGNOSIS — F809 Developmental disorder of speech and language, unspecified: Secondary | ICD-10-CM | POA: Diagnosis not present

## 2014-06-06 DIAGNOSIS — K219 Gastro-esophageal reflux disease without esophagitis: Secondary | ICD-10-CM | POA: Insufficient documentation

## 2014-06-06 DIAGNOSIS — I10 Essential (primary) hypertension: Secondary | ICD-10-CM | POA: Diagnosis not present

## 2014-06-06 DIAGNOSIS — M5412 Radiculopathy, cervical region: Secondary | ICD-10-CM | POA: Diagnosis not present

## 2014-06-06 DIAGNOSIS — E785 Hyperlipidemia, unspecified: Secondary | ICD-10-CM | POA: Insufficient documentation

## 2014-06-06 DIAGNOSIS — Z950 Presence of cardiac pacemaker: Secondary | ICD-10-CM | POA: Insufficient documentation

## 2014-06-06 DIAGNOSIS — IMO0002 Reserved for concepts with insufficient information to code with codable children: Secondary | ICD-10-CM | POA: Insufficient documentation

## 2014-06-06 DIAGNOSIS — Z8669 Personal history of other diseases of the nervous system and sense organs: Secondary | ICD-10-CM | POA: Insufficient documentation

## 2014-06-06 DIAGNOSIS — R079 Chest pain, unspecified: Secondary | ICD-10-CM | POA: Diagnosis not present

## 2014-06-06 DIAGNOSIS — R0789 Other chest pain: Secondary | ICD-10-CM | POA: Diagnosis not present

## 2014-06-06 DIAGNOSIS — Z9889 Other specified postprocedural states: Secondary | ICD-10-CM | POA: Insufficient documentation

## 2014-06-06 DIAGNOSIS — E119 Type 2 diabetes mellitus without complications: Secondary | ICD-10-CM | POA: Diagnosis not present

## 2014-06-06 LAB — CBC
HEMATOCRIT: 38.2 % (ref 36.0–46.0)
Hemoglobin: 13.1 g/dL (ref 12.0–15.0)
MCH: 31.3 pg (ref 26.0–34.0)
MCHC: 34.3 g/dL (ref 30.0–36.0)
MCV: 91.2 fL (ref 78.0–100.0)
Platelets: 228 10*3/uL (ref 150–400)
RBC: 4.19 MIL/uL (ref 3.87–5.11)
RDW: 12.3 % (ref 11.5–15.5)
WBC: 7.6 10*3/uL (ref 4.0–10.5)

## 2014-06-06 LAB — BASIC METABOLIC PANEL
BUN: 21 mg/dL (ref 6–23)
CO2: 25 meq/L (ref 19–32)
Calcium: 9.7 mg/dL (ref 8.4–10.5)
Chloride: 98 mEq/L (ref 96–112)
Creatinine, Ser: 1.32 mg/dL — ABNORMAL HIGH (ref 0.50–1.10)
GFR calc non Af Amer: 35 mL/min — ABNORMAL LOW (ref >90)
GFR, EST AFRICAN AMERICAN: 41 mL/min — AB (ref >90)
Glucose, Bld: 108 mg/dL — ABNORMAL HIGH (ref 70–99)
Potassium: 4 mEq/L (ref 3.7–5.3)
Sodium: 139 mEq/L (ref 137–147)

## 2014-06-06 LAB — I-STAT TROPONIN, ED: TROPONIN I, POC: 0.01 ng/mL (ref 0.00–0.08)

## 2014-06-06 MED ORDER — TRAMADOL HCL 50 MG PO TABS: 50.0000 mg | ORAL_TABLET | Freq: Four times a day (QID) | ORAL | Status: DC | PRN

## 2014-06-06 NOTE — ED Provider Notes (Signed)
CSN: [id number]     Arrival date & time 06/06/14  1703 History   First MD Initiated Contact with Patient 06/06/14 1811     Chief Complaint  Patient presents with  . Jaw Pain     (Consider location/radiation/quality/duration/timing/severity/associated sxs/prior Treatment) HPI Comments: Patient was referred to the emergency department by Doctor Tilly's nurse. She called today because she has been having intermittent pains in the neck with "quivering" of the jaw. She was told to go to the ER rather than the office to be evaluated.  Patient states that she has a shooting pain on the left side of the neck that occurs intermittently. It often occurs when she moves her head and neck. It does not last long, immediately resolves. She has also been experiencing the intermittent quivering and trembling of the jaw. There is no jaw pain. This has not occurring concomitantly with the neck pain.  Patient has noticed some discomfort in the area of her pacemaker in the left chest. She has been noticing discomfort going down her arm. She reports that she cannot rest her arm on an arm rest were insert physicians because that causes the pain that she has been experiencing. She has not noticed any weakness in the arm. There is no numbness, tingling.   Past Medical History  Diagnosis Date  . Secondary cardiomyopathy, unspecified   . Occlusion and stenosis of carotid artery without mention of cerebral infarction   . GERD (gastroesophageal reflux disease)   . Diabetes mellitus without complication   . A-fib   . Biventricular cardiac pacemaker in situ     05/06/13  Old AICD explanted and 6949 lead capped  RV lead  Medtronic 5076 [id number]  LV lead  Medtronic 4194  [id number] V Medtronic biventricular pacer  PVX [id number]   . Hyperlipidemia   . Peripheral neuropathy   . History of esophageal stricture   . History of duodenal ulcer   . Transient global amnesia   . Hypertension    Past Surgical History  Procedure  Laterality Date  . Cardiac defibrillator placement  09/2004    MDT ICD implanted for primary prevention; device upgrade to CRTP in 04-2013  . Cardiac catheterization      04/2003  . Appendectomy    . Abdominal hysterectomy    . Biv pacemaker generator change out  05/05/2013    MDT CRTP upgrade (previously implanted dual chamber ICD) by Dr Lovena Le.  New RV/LV leads placed.   . Carpal tunnel release    . Cholecystectomy    . Breast surgery Left 1985    Negative  Biopsy   Family History  Problem Relation Age of Onset  . Cancer Maternal Grandmother     breast  . Dementia Mother   . Diabetes Mother   . Heart Problems Father   . Heart attack Son   . Heart disease Son   . Stroke Daughter   . Hypertension Daughter   . Hyperlipidemia Daughter   . Diabetes Daughter   . Diabetes Sister   . Hypertension Sister   . Hyperlipidemia Sister   . Heart disease Brother   . Heart attack Brother   . Hypertension Sister   . Hyperlipidemia Sister   . Cancer Sister     Breast  . Heart Problems Sister    History  Substance Use Topics  . Smoking status: Passive Smoke Exposure - Never Smoker  . Smokeless tobacco: Never Used  . Alcohol Use: No   OB History  Grav Para Term Preterm Abortions TAB SAB Ect Mult Living                 Review of Systems  Respiratory: Negative for shortness of breath.   Cardiovascular: Positive for chest pain.  Musculoskeletal: Positive for neck pain.  All other systems reviewed and are negative.     Allergies  Nexium  Home Medications   Prior to Admission medications   Medication Sig Start Date End Date Taking? Authorizing Provider  atorvastatin (LIPITOR) 80 MG tablet Take 1 tablet (80 mg total) by mouth daily. 10/19/13 10/19/14  Unk Pinto, MD  b complex vitamins capsule Take 1 capsule by mouth daily.     Historical Provider, MD  bumetanide (BUMEX) 1 MG tablet Take 0.5 mg by mouth daily.    Historical Provider, MD  carvedilol (COREG) 6.25 MG tablet  Take 6.25 mg by mouth 2 (two) times daily with a meal.    Historical Provider, MD  Cholecalciferol (VITAMIN D3) 1000 UNITS CAPS Take 1,000-2,000 Units by mouth daily. Take 1000mg  on Monday, Wednesday, and Friday. Takes 2000mg  all other days    Historical Provider, MD  clopidogrel (PLAVIX) 75 MG tablet Take 1 tablet (75 mg total) by mouth daily with breakfast. 06/24/13   Eugenie Filler, MD  gabapentin (NEURONTIN) 600 MG tablet TAKE 1 TABLET BY MOUTH AT BEDTIME FOR NEUROPATHY 04/22/14   Vicie Mutters, PA-C  losartan (COZAAR) 50 MG tablet Take 25 mg by mouth daily.     Historical Provider, MD  magnesium gluconate (MAGONATE) 500 MG tablet Take 500 mg by mouth daily.     Historical Provider, MD  ranitidine (ZANTAC) 300 MG tablet Take 300 mg by mouth every morning.    Historical Provider, MD  traMADol (ULTRAM) 50 MG tablet Take 1 tablet (50 mg total) by mouth every 6 (six) hours as needed. 06/06/14   Orpah Greek, MD   BP 147/57  Pulse 62  Temp(Src) 98.5 F (36.9 C) (Oral)  Resp 21  Ht 5\' 3"  (1.6 m)  Wt 152 lb (68.947 kg)  BMI 26.93 kg/m2  SpO2 100% Physical Exam  Constitutional: She is oriented to person, place, and time. She appears well-developed and well-nourished. No distress.  HENT:  Head: Normocephalic and atraumatic.  Right Ear: Hearing normal.  Left Ear: Hearing normal.  Nose: Nose normal.  Mouth/Throat: Oropharynx is clear and moist and mucous membranes are normal.  Eyes: Conjunctivae and EOM are normal. Pupils are equal, round, and reactive to light.  Neck: Normal range of motion. Neck supple.  Cardiovascular: Regular rhythm, S1 normal and S2 normal.  Exam reveals no gallop and no friction rub.   No murmur heard. Pulmonary/Chest: Effort normal and breath sounds normal. No respiratory distress. She exhibits no tenderness.  Abdominal: Soft. Normal appearance and bowel sounds are normal. There is no hepatosplenomegaly. There is no tenderness. There is no rebound, no  guarding, no tenderness at McBurney's point and negative Murphy's sign. No hernia.  Musculoskeletal: Normal range of motion.  Neurological: She is alert and oriented to person, place, and time. She has normal strength. No cranial nerve deficit or sensory deficit. Coordination normal. GCS eye subscore is 4. GCS verbal subscore is 5. GCS motor subscore is 6.  Reflex Scores:      Tricep reflexes are 2+ on the right side and 2+ on the left side.      Bicep reflexes are 2+ on the right side and 2+ on the left side. Upper extremity  grip strength 5 over 5 symmetric bilaterally  Sensation normal bilateral upper extremities  Skin: Skin is warm, dry and intact. No rash noted. No cyanosis.  Psychiatric: She has a normal mood and affect. Her speech is normal and behavior is normal. Thought content normal.    ED Course  Procedures (including critical care time) Labs Review Labs Reviewed  BASIC METABOLIC PANEL - Abnormal; Notable for the following:    Glucose, Bld 108 (*)    Creatinine, Ser 1.32 (*)    GFR calc non Af Amer 35 (*)    GFR calc Af Amer 41 (*)    All other components within normal limits  CBC  I-STAT TROPOININ, ED    Imaging Review Dg Chest 2 View  06/06/2014   CLINICAL DATA:  Left-sided chest pain  EXAM: CHEST  2 VIEW  COMPARISON:  05/06/2013  FINDINGS: There is no focal parenchymal opacity, pleural effusion, or pneumothorax. The heart and mediastinal contours are unremarkable. An AICD is noted.  The osseous structures are unremarkable.  IMPRESSION: No active cardiopulmonary disease.   Electronically Signed   By: Kathreen Devoid   On: 06/06/2014 19:00     EKG Interpretation   Date/Time:  Monday June 06 2014 17:45:33 EDT Ventricular Rate:  65 PR Interval:  134 QRS Duration: 124 QT Interval:  490 QTC Calculation: 509 R Axis:   -82 Text Interpretation:  ventricular pacing Confirmed by POLLINA  MD,  CHRISTOPHER ([id number]) on 06/06/2014 6:16:57 PM      MDM   Final diagnoses:   Cervical radiculopathy   Please refer to the emergency department by her doctor's office because of neck pain and jaw pain. The symptoms, however, were actually more of a quivering of the muscles around the, never had any jaw pain. Neck pain is only intermittent and is related to movement. She denies any injury. She has normal strength and sensation in the upper extremities, but there is radiation of the pain down the arm. This is consistent with cervical radiculopathy. She does have a history of A. fib and cardiomyopathy. Chest x-ray and troponin was therefore obtained. These were normal. No evidence of congestive heart failure, no concern for acute coronary syndrome. She has not experiencing any pain in the left arm at any point during the evaluation here in the ER. She had mentioned some area of discomfort around the pacemaker, but there is no concern for infection or swelling based on exam.  Findings were discussed with the patient and her family. I have recommended discharge with prompt followup with primary doctor for repeat evaluation of cervical radiculopathy. She can recontact her cardiology office and be seen. She was prescribed Ultram for the neck pain, return for any recurrent chest pain.    Orpah Greek, MD 06/06/14 2016

## 2014-06-06 NOTE — Discharge Instructions (Signed)

## 2014-06-06 NOTE — ED Notes (Addendum)
Pt in c/o jaw "quivering" and left neck pain with movement, states she has a pacemaker, denies chest pain, also dizziness which is worse with position change, no distress noted, pt states symptoms have been going on over the last week

## 2014-06-18 ENCOUNTER — Other Ambulatory Visit: Payer: Self-pay | Admitting: Internal Medicine

## 2014-07-05 DIAGNOSIS — L821 Other seborrheic keratosis: Secondary | ICD-10-CM | POA: Diagnosis not present

## 2014-07-05 DIAGNOSIS — D239 Other benign neoplasm of skin, unspecified: Secondary | ICD-10-CM | POA: Diagnosis not present

## 2014-07-05 DIAGNOSIS — L570 Actinic keratosis: Secondary | ICD-10-CM | POA: Diagnosis not present

## 2014-07-14 ENCOUNTER — Ambulatory Visit: Payer: Self-pay | Admitting: Internal Medicine

## 2014-07-20 ENCOUNTER — Encounter: Payer: Self-pay | Admitting: Internal Medicine

## 2014-07-20 ENCOUNTER — Ambulatory Visit: Payer: Self-pay | Admitting: Internal Medicine

## 2014-07-20 ENCOUNTER — Other Ambulatory Visit: Payer: Self-pay | Admitting: Internal Medicine

## 2014-07-21 ENCOUNTER — Encounter: Payer: Self-pay | Admitting: Physician Assistant

## 2014-07-21 ENCOUNTER — Ambulatory Visit (INDEPENDENT_AMBULATORY_CARE_PROVIDER_SITE_OTHER): Payer: Medicare Other | Admitting: Physician Assistant

## 2014-07-21 VITALS — BP 120/62 | HR 68 | Temp 98.1°F | Resp 16 | Ht 63.0 in | Wt 153.0 lb

## 2014-07-21 DIAGNOSIS — R42 Dizziness and giddiness: Secondary | ICD-10-CM | POA: Diagnosis not present

## 2014-07-21 DIAGNOSIS — I10 Essential (primary) hypertension: Secondary | ICD-10-CM

## 2014-07-21 DIAGNOSIS — I5022 Chronic systolic (congestive) heart failure: Secondary | ICD-10-CM | POA: Diagnosis not present

## 2014-07-21 DIAGNOSIS — N390 Urinary tract infection, site not specified: Secondary | ICD-10-CM | POA: Diagnosis not present

## 2014-07-21 DIAGNOSIS — Z950 Presence of cardiac pacemaker: Secondary | ICD-10-CM | POA: Diagnosis not present

## 2014-07-21 DIAGNOSIS — R0602 Shortness of breath: Secondary | ICD-10-CM | POA: Diagnosis not present

## 2014-07-21 DIAGNOSIS — Z79899 Other long term (current) drug therapy: Secondary | ICD-10-CM

## 2014-07-21 LAB — CBC WITH DIFFERENTIAL/PLATELET
Basophils Absolute: 0 10*3/uL (ref 0.0–0.1)
Basophils Relative: 0 % (ref 0–1)
Eosinophils Absolute: 0.2 10*3/uL (ref 0.0–0.7)
Eosinophils Relative: 3 % (ref 0–5)
HCT: 36.5 % (ref 36.0–46.0)
Hemoglobin: 12.5 g/dL (ref 12.0–15.0)
LYMPHS ABS: 2.4 10*3/uL (ref 0.7–4.0)
LYMPHS PCT: 34 % (ref 12–46)
MCH: 30.9 pg (ref 26.0–34.0)
MCHC: 34.2 g/dL (ref 30.0–36.0)
MCV: 90.3 fL (ref 78.0–100.0)
Monocytes Absolute: 0.5 10*3/uL (ref 0.1–1.0)
Monocytes Relative: 7 % (ref 3–12)
NEUTROS PCT: 56 % (ref 43–77)
Neutro Abs: 3.9 10*3/uL (ref 1.7–7.7)
PLATELETS: 254 10*3/uL (ref 150–400)
RBC: 4.04 MIL/uL (ref 3.87–5.11)
RDW: 13 % (ref 11.5–15.5)
WBC: 7 10*3/uL (ref 4.0–10.5)

## 2014-07-21 NOTE — Patient Instructions (Addendum)
If your morning sugar is always below 100 then the issue is with your sugar spiking after meals. Try to take your blood sugar approximately 2 hours after eating, this number should be less than 200. If it is not, think about the foods that you ate and better choices you can make.   Go to the ER if any CP, SOB, nausea, dizziness, severe HA, changes vision/speech   Low Blood Sugar Low blood sugar (hypoglycemia) means that the level of sugar in your blood is lower than it should be. Signs of low blood sugar include:  Getting sweaty.  Feeling hungry.  Feeling dizzy or weak.  Feeling sleepier than normal.  Feeling nervous.  Headaches.  Having a fast heartbeat. Low blood sugar can happen fast and can be an emergency. Your doctor can do tests to check your blood sugar level. You can have low blood sugar and not have diabetes. HOME CARE  Check your blood sugar as told by your doctor. If it is less than 70 mg/dl or as told by your doctor, take 1 of the following:  3 to 4 glucose tablets.   cup clear juice.   cup soda pop, not diet.  1 cup milk.  5 to 6 hard candies.  Recheck blood sugar after 15 minutes. Repeat until it is at the right level.  Eat a snack if it is more than 1 hour until the next meal.  Only take medicine as told by your doctor.  Do not skip meals. Eat on time.  Do not drink alcohol except with meals.  Check your blood glucose before driving.  Check your blood glucose before and after exercise.  Always carry treatment with you, such as glucose pills.  Always wear a medical alert bracelet if you have diabetes. GET HELP RIGHT AWAY IF:   Your blood glucose goes below 70 mg/dl or as told by your doctor, and you:  Are confused.  Are not able to swallow.  Pass out (faint).  You cannot treat yourself. You may need someone to help you.  You have low blood sugar problems often.  You have problems from your medicines.  You are not feeling better  after 3 to 4 days.  You have vision changes. MAKE SURE YOU:   Understand these instructions.  Will watch this condition.  Will get help right away if you are not doing well or get worse. Document Released: 02/19/2010 Document Revised: 02/17/2012 Document Reviewed: 02/19/2010 [facility] Patient Information 2015 [place], [place]. This information is not intended to replace advice given to you by your health care provider. Make sure you discuss any questions you have with your health care provider.

## 2014-07-21 NOTE — Progress Notes (Signed)
Subjective:    Patient ID: [name], female    DOB: 08/08/1925, 80 y.o.   MRN: [id number]  HPI 80 y.o. female with complicated heart history of NIDCMP s/p AICD, HTN, aphasia, CKD, preDM presents with dizziness. She states that she has been dizzy with standing, bending over for the past 2-3 months, happening daily, getting worse. She has had some vision changes both eyes can go dark if she stands up quickly, questionable amourosis fugax. She also states it is worse if she is riding in a car. Denies headaches, speech changes. She sees Dr. Wynonia Lawman, has had normal carotid US 2014, last Echo was 2014 showed EF 25%, mild MR, no thrombus. She is on plavix.   Some shortness of breath which is chronic, but has been worse recently, worse with exertion/ standing. She sleeps on the a wedge, she has some edema, she wakes up 2-3 times a night denies PND, worse the last 2 nights. Checks her sugar and it has been lowest is 57. She has fatigue during the day.   Lab Results  Component Value Date   WBC 7.6 06/06/2014   HGB 13.1 06/06/2014   HCT 38.2 06/06/2014   MCV 91.2 06/06/2014   PLT 228 06/06/2014   Lab Results  Component Value Date   CREATININE 1.32* 06/06/2014   BUN 21 06/06/2014   NA 139 06/06/2014   K 4.0 06/06/2014   CL 98 06/06/2014   CO2 25 06/06/2014   Wt Readings from Last 3 Encounters:  07/21/14 153 lb (69.4 kg)  06/06/14 152 lb (68.947 kg)  05/20/14 152 lb 3.2 oz (69.037 kg)    Current Outpatient Prescriptions on File Prior to Visit  Medication Sig Dispense Refill  . atorvastatin (LIPITOR) 80 MG tablet Take 1 tablet (80 mg total) by mouth daily.  30 tablet  11  . b complex vitamins capsule Take 1 capsule by mouth daily.       . bumetanide (BUMEX) 1 MG tablet Take 0.5 mg by mouth daily.      . carvedilol (COREG) 6.25 MG tablet Take 6.25 mg by mouth 2 (two) times daily with a meal.      . Cholecalciferol (VITAMIN D3) 1000 UNITS CAPS Take 1,000-2,000 Units by mouth daily. Take 1000mg  on  Monday, Wednesday, and Friday. Takes 2000mg  all other days      . clopidogrel (PLAVIX) 75 MG tablet TAKE 1 TABLET BY MOUTH EVERY DAY TO PREVENT HEART ATTACK  30 tablet  11  . FREESTYLE LITE test strip TEST GLUCOSE ONCE DAILY AS DIRECTED  100 each  2  . gabapentin (NEURONTIN) 600 MG tablet TAKE 1 TABLET BY MOUTH AT BEDTIME FOR NEUROPATHY  90 tablet  0  . losartan (COZAAR) 50 MG tablet Take 25 mg by mouth daily.       . magnesium gluconate (MAGONATE) 500 MG tablet Take 500 mg by mouth daily.       . ranitidine (ZANTAC) 300 MG tablet Take 300 mg by mouth every morning.       No current facility-administered medications on file prior to visit.     Review of Systems  Constitutional: Positive for fatigue. Negative for fever, chills, diaphoresis, activity change, appetite change and unexpected weight change.  HENT: Positive for postnasal drip. Negative for congestion, dental problem, drooling, ear discharge, ear pain, facial swelling, hearing loss, mouth sores, nosebleeds, rhinorrhea, sinus pressure, sneezing, sore throat, tinnitus, trouble swallowing and voice change.   Respiratory: Positive for shortness of breath.  Negative for apnea, cough, choking, chest tightness, wheezing and stridor.   Cardiovascular: Positive for leg swelling. Negative for chest pain and palpitations.  Gastrointestinal: Negative.   Genitourinary: Negative.   Musculoskeletal: Negative.   Skin: Negative.   Neurological: Positive for dizziness and light-headedness. Negative for tremors, seizures, syncope, facial asymmetry, speech difficulty, weakness, numbness and headaches.       Objective:   Physical Exam  Constitutional: She is oriented to person, place, and time. She appears well-developed and well-nourished.  HENT:  Head: Normocephalic and atraumatic.  Bilateral ear impaction  Eyes: Conjunctivae and EOM are normal. Pupils are equal, round, and reactive to light.  Neck: Normal range of motion. Neck supple. No JVD  present.  Cardiovascular: Normal rate and regular rhythm.   No murmur heard. Pulmonary/Chest: Effort normal and breath sounds normal. No respiratory distress. She has no wheezes. She has no rales. She exhibits no tenderness.  Abdominal: Soft. Bowel sounds are normal. There is no tenderness.  Musculoskeletal: Normal range of motion. She exhibits edema (1+edema).  Neurological: She is alert and oriented to person, place, and time. She has normal reflexes. She displays normal reflexes. No cranial nerve deficit. She exhibits normal muscle tone. Abnormal coordination: slightly unsteady gait.  Skin: Skin is warm and dry. No rash noted.       Assessment & Plan:  Dizziness- ? Orthostatic BP, versus Vertigo Appears to be positional, hold bumex until labs come back, eat small frequent meals and check sugars, follow up with heart doctor.  Get EKG shows paced, labs including BNP, CBC, BMP, liver- normal neuro exam, normal history of carotid US, NSR Go to the ER if any CP, SOB, nausea, dizziness, severe HA, changes vision/speech

## 2014-07-22 LAB — BASIC METABOLIC PANEL WITH GFR
BUN: 17 mg/dL (ref 6–23)
CHLORIDE: 101 meq/L (ref 96–112)
CO2: 29 meq/L (ref 19–32)
Calcium: 9.7 mg/dL (ref 8.4–10.5)
Creat: 1.43 mg/dL — ABNORMAL HIGH (ref 0.50–1.10)
GFR, Est African American: 38 mL/min — ABNORMAL LOW
GFR, Est Non African American: 33 mL/min — ABNORMAL LOW
Glucose, Bld: 193 mg/dL — ABNORMAL HIGH (ref 70–99)
Potassium: 4.2 mEq/L (ref 3.5–5.3)
SODIUM: 139 meq/L (ref 135–145)

## 2014-07-22 LAB — HEPATIC FUNCTION PANEL
ALK PHOS: 55 U/L (ref 39–117)
ALT: 12 U/L (ref 0–35)
AST: 19 U/L (ref 0–37)
Albumin: 4.1 g/dL (ref 3.5–5.2)
BILIRUBIN TOTAL: 0.4 mg/dL (ref 0.2–1.2)
Bilirubin, Direct: 0.1 mg/dL (ref 0.0–0.3)
Indirect Bilirubin: 0.3 mg/dL (ref 0.2–1.2)
Total Protein: 6 g/dL (ref 6.0–8.3)

## 2014-07-22 LAB — URINALYSIS, MICROSCOPIC ONLY
Bacteria, UA: NONE SEEN
Casts: NONE SEEN
Crystals: NONE SEEN
SQUAMOUS EPITHELIAL / LPF: NONE SEEN

## 2014-07-22 LAB — URINALYSIS, ROUTINE W REFLEX MICROSCOPIC
Bilirubin Urine: NEGATIVE
GLUCOSE, UA: NEGATIVE mg/dL
HGB URINE DIPSTICK: NEGATIVE
Ketones, ur: NEGATIVE mg/dL
Nitrite: NEGATIVE
Protein, ur: NEGATIVE mg/dL
Specific Gravity, Urine: <1.005 — ABNORMAL LOW (ref 1.005–1.030)
Urobilinogen, UA: 0.2 mg/dL (ref 0.0–1.0)
pH: 6 (ref 5.0–8.0)

## 2014-07-22 LAB — BRAIN NATRIURETIC PEPTIDE: Brain Natriuretic Peptide: 143.3 pg/mL — ABNORMAL HIGH (ref 0.0–100.0)

## 2014-07-22 LAB — MAGNESIUM: Magnesium: 2.1 mg/dL (ref 1.5–2.5)

## 2014-07-23 LAB — URINE CULTURE: Colony Count: 50000

## 2014-07-26 ENCOUNTER — Other Ambulatory Visit: Payer: Self-pay | Admitting: Physician Assistant

## 2014-08-02 ENCOUNTER — Ambulatory Visit (INDEPENDENT_AMBULATORY_CARE_PROVIDER_SITE_OTHER): Payer: Medicare Other | Admitting: Physician Assistant

## 2014-08-02 ENCOUNTER — Encounter: Payer: Self-pay | Admitting: Internal Medicine

## 2014-08-02 VITALS — BP 140/62 | HR 70 | Temp 98.2°F | Resp 16 | Ht 63.0 in | Wt 155.0 lb

## 2014-08-02 DIAGNOSIS — Z79899 Other long term (current) drug therapy: Secondary | ICD-10-CM | POA: Diagnosis not present

## 2014-08-02 DIAGNOSIS — I10 Essential (primary) hypertension: Secondary | ICD-10-CM | POA: Diagnosis not present

## 2014-08-02 DIAGNOSIS — R0602 Shortness of breath: Secondary | ICD-10-CM | POA: Diagnosis not present

## 2014-08-02 DIAGNOSIS — F411 Generalized anxiety disorder: Secondary | ICD-10-CM | POA: Diagnosis not present

## 2014-08-02 LAB — CBC WITH DIFFERENTIAL/PLATELET
Basophils Absolute: 0.1 10*3/uL (ref 0.0–0.1)
Basophils Relative: 1 % (ref 0–1)
Eosinophils Absolute: 0.2 10*3/uL (ref 0.0–0.7)
Eosinophils Relative: 2 % (ref 0–5)
HCT: 37.5 % (ref 36.0–46.0)
HEMOGLOBIN: 12.9 g/dL (ref 12.0–15.0)
LYMPHS ABS: 2.3 10*3/uL (ref 0.7–4.0)
LYMPHS PCT: 26 % (ref 12–46)
MCH: 31.3 pg (ref 26.0–34.0)
MCHC: 34.4 g/dL (ref 30.0–36.0)
MCV: 91 fL (ref 78.0–100.0)
MONOS PCT: 9 % (ref 3–12)
Monocytes Absolute: 0.8 10*3/uL (ref 0.1–1.0)
NEUTROS ABS: 5.4 10*3/uL (ref 1.7–7.7)
NEUTROS PCT: 62 % (ref 43–77)
Platelets: 242 10*3/uL (ref 150–400)
RBC: 4.12 MIL/uL (ref 3.87–5.11)
RDW: 12.7 % (ref 11.5–15.5)
WBC: 8.7 10*3/uL (ref 4.0–10.5)

## 2014-08-02 NOTE — Patient Instructions (Signed)
Morning sugar without food should be less than 150, 2 hours after food sugar should be less than 200.  Please make sure you are eating small frequent meals to keep your sugar up. A low blood sugar for you is below 70.   Go to the ER if any CP, SOB, nausea, dizziness, severe HA, changes vision/speech   Please follow up with your heart doctor.   Do the following things EVERYDAY: 1) Weigh yourself in the morning before breakfast. Write it down and keep it in a log. 2) Take your medicines as prescribed 3) Eat low salt foods-Limit salt (sodium) to 2000 mg per day.  4) Stay as active as you can everyday 5) Limit all fluids for the day to less than 2 liters 6)

## 2014-08-02 NOTE — Progress Notes (Signed)
HPI 80 y.o.female presents for follow up for dizziness thought to be from dehydration/orthostatics, she was suppose to decrease her bumex to every other day but she has been taking her bumex daily. She states that her dizziness has improved but that she continues to feel anxious/fatigue during the day. She reports an episode of shortness of breath while riding in a car with a friend, nonexertional, lasted for seconds, denies palpitations, CP, dizziness. She states that she "gives out easily" with anything she does like walking, bending over. She has a pacemaker check with Dr. Lovena Le on sept 1st. She states she feels anxious all the time.   Past Medical History  Diagnosis Date  . Secondary cardiomyopathy, unspecified   . Occlusion and stenosis of carotid artery without mention of cerebral infarction   . GERD (gastroesophageal reflux disease)   . Diabetes mellitus without complication   . A-fib   . Biventricular cardiac pacemaker in situ     05/06/13  Old AICD explanted and 6949 lead capped  RV lead  Medtronic 5076 [id number]  LV lead  Medtronic 4194  [id number] V Medtronic biventricular pacer  PVX [id number]   . Hyperlipidemia   . Peripheral neuropathy   . History of esophageal stricture   . History of duodenal ulcer   . Transient global amnesia   . Hypertension      Allergies  Allergen Reactions  . Nexium [Esomeprazole Magnesium]     Diarrhea      Current Outpatient Prescriptions on File Prior to Visit  Medication Sig Dispense Refill  . atorvastatin (LIPITOR) 80 MG tablet Take 1 tablet (80 mg total) by mouth daily.  30 tablet  11  . b complex vitamins capsule Take 1 capsule by mouth daily.       . bumetanide (BUMEX) 1 MG tablet Take 0.5 mg by mouth daily.      . carvedilol (COREG) 6.25 MG tablet Take 6.25 mg by mouth 2 (two) times daily with a meal.      . Cholecalciferol (VITAMIN D3) 1000 UNITS CAPS Take 1,000-2,000 Units by mouth daily. Take 1000mg  on Monday, Wednesday, and Friday. Takes  2000mg  all other days      . clopidogrel (PLAVIX) 75 MG tablet TAKE 1 TABLET BY MOUTH EVERY DAY TO PREVENT HEART ATTACK  30 tablet  11  . FREESTYLE LITE test strip TEST GLUCOSE ONCE DAILY AS DIRECTED  100 each  2  . gabapentin (NEURONTIN) 600 MG tablet TAKE 1 TABLET BY MOUTH AT BEDTIME FOR NEUROPATHY  90 tablet  1  . losartan (COZAAR) 50 MG tablet Take 25 mg by mouth daily.       . magnesium gluconate (MAGONATE) 500 MG tablet Take 500 mg by mouth daily.       . ranitidine (ZANTAC) 300 MG tablet Take 300 mg by mouth every morning.       No current facility-administered medications on file prior to visit.    ROS: all negative expect above.   Physical:  BP 140/62  Pulse 70  Temp(Src) 98.2 F (36.8 C) (Temporal)  Resp 16  Ht 5\' 3"  (1.6 m)  Wt 155 lb (70.308 kg)  BMI 27.46 kg/m2 Wt Readings from Last 3 Encounters:  08/02/14 155 lb (70.308 kg)  07/21/14 153 lb (69.4 kg)  06/06/14 152 lb (68.947 kg)   General Appearance: Well nourished, in no apparent distress. Eyes: PERRLA, EOMs. Sinuses: No Frontal/maxillary tenderness ENT/Mouth: Ext aud canals clear, normal light reflex with TMs without erythema, bulging. Post pharynx  without erythema, swelling, exudate.  Respiratory: CTAB Cardio: NSR at the time, no murmurs, rubs or gallops. Peripheral pulses brisk and equal bilaterally, with trace edema. No aortic or femoral bruits. Abdomen: Soft, with bowl sounds. Nontender, no guarding, rebound. Lymphatics: Non tender without lymphadenopathy.  Musculoskeletal: Full ROM all peripheral extremities.  Skin: Warm, dry without rashes, lesions, ecchymosis.  Neuro: Cranial nerves intact, reflexes equal bilaterally. Normal muscle tone, no cerebellar symptoms, gait slightly unsteady but better than last visit Pysch: Awake and oriented X 3, normal affect, Insight and Judgment appropriate.   Assessment and Plan: ? Afib/CAD/anxiety- suggest follow up with cardiology, we may need to increase Coreg to 12.5  BID pending pacemaker reading, declines anxiety medications at this time Dizziness improved- will recheck BMP Dyspnea- recheck BNP,? Anxiety component- declines medications at this time.

## 2014-08-03 LAB — HEPATIC FUNCTION PANEL
ALT: 15 U/L (ref 0–35)
AST: 22 U/L (ref 0–37)
Albumin: 4.2 g/dL (ref 3.5–5.2)
Alkaline Phosphatase: 50 U/L (ref 39–117)
BILIRUBIN DIRECT: 0.1 mg/dL (ref 0.0–0.3)
Indirect Bilirubin: 0.5 mg/dL (ref 0.2–1.2)
Total Bilirubin: 0.6 mg/dL (ref 0.2–1.2)
Total Protein: 6.3 g/dL (ref 6.0–8.3)

## 2014-08-03 LAB — BASIC METABOLIC PANEL WITH GFR
BUN: 16 mg/dL (ref 6–23)
CHLORIDE: 104 meq/L (ref 96–112)
CO2: 29 meq/L (ref 19–32)
Calcium: 9.3 mg/dL (ref 8.4–10.5)
Creat: 1.3 mg/dL — ABNORMAL HIGH (ref 0.50–1.10)
GFR, Est African American: 43 mL/min — ABNORMAL LOW
GFR, Est Non African American: 37 mL/min — ABNORMAL LOW
GLUCOSE: 100 mg/dL — AB (ref 70–99)
POTASSIUM: 4.8 meq/L (ref 3.5–5.3)
SODIUM: 141 meq/L (ref 135–145)

## 2014-08-03 LAB — BRAIN NATRIURETIC PEPTIDE: Brain Natriuretic Peptide: 146 pg/mL — ABNORMAL HIGH (ref 0.0–100.0)

## 2014-08-03 LAB — MAGNESIUM: Magnesium: 1.9 mg/dL (ref 1.5–2.5)

## 2014-08-09 ENCOUNTER — Encounter: Payer: Medicare Other | Admitting: Internal Medicine

## 2014-08-12 DIAGNOSIS — I428 Other cardiomyopathies: Secondary | ICD-10-CM | POA: Diagnosis not present

## 2014-08-12 DIAGNOSIS — I951 Orthostatic hypotension: Secondary | ICD-10-CM | POA: Diagnosis not present

## 2014-08-12 DIAGNOSIS — Z8673 Personal history of transient ischemic attack (TIA), and cerebral infarction without residual deficits: Secondary | ICD-10-CM | POA: Diagnosis not present

## 2014-08-12 DIAGNOSIS — R0602 Shortness of breath: Secondary | ICD-10-CM | POA: Diagnosis not present

## 2014-08-12 DIAGNOSIS — I6529 Occlusion and stenosis of unspecified carotid artery: Secondary | ICD-10-CM | POA: Diagnosis not present

## 2014-08-12 DIAGNOSIS — I10 Essential (primary) hypertension: Secondary | ICD-10-CM | POA: Diagnosis not present

## 2014-08-12 DIAGNOSIS — E785 Hyperlipidemia, unspecified: Secondary | ICD-10-CM | POA: Diagnosis not present

## 2014-08-12 DIAGNOSIS — I509 Heart failure, unspecified: Secondary | ICD-10-CM | POA: Diagnosis not present

## 2014-08-23 ENCOUNTER — Encounter: Payer: Self-pay | Admitting: Internal Medicine

## 2014-08-31 ENCOUNTER — Ambulatory Visit (INDEPENDENT_AMBULATORY_CARE_PROVIDER_SITE_OTHER): Payer: Medicare Other | Admitting: Physician Assistant

## 2014-08-31 ENCOUNTER — Encounter: Payer: Self-pay | Admitting: Physician Assistant

## 2014-08-31 VITALS — BP 128/68 | HR 60 | Temp 98.5°F | Resp 16 | Ht 63.0 in | Wt 151.0 lb

## 2014-08-31 DIAGNOSIS — N183 Chronic kidney disease, stage 3 unspecified: Secondary | ICD-10-CM | POA: Diagnosis not present

## 2014-08-31 DIAGNOSIS — Z23 Encounter for immunization: Secondary | ICD-10-CM | POA: Diagnosis not present

## 2014-08-31 DIAGNOSIS — I5022 Chronic systolic (congestive) heart failure: Secondary | ICD-10-CM | POA: Diagnosis not present

## 2014-08-31 DIAGNOSIS — Z9181 History of falling: Secondary | ICD-10-CM | POA: Diagnosis not present

## 2014-08-31 DIAGNOSIS — E785 Hyperlipidemia, unspecified: Secondary | ICD-10-CM | POA: Diagnosis not present

## 2014-08-31 DIAGNOSIS — Z1331 Encounter for screening for depression: Secondary | ICD-10-CM

## 2014-08-31 DIAGNOSIS — Z0000 Encounter for general adult medical examination without abnormal findings: Secondary | ICD-10-CM | POA: Diagnosis not present

## 2014-08-31 DIAGNOSIS — I10 Essential (primary) hypertension: Secondary | ICD-10-CM

## 2014-08-31 DIAGNOSIS — E559 Vitamin D deficiency, unspecified: Secondary | ICD-10-CM

## 2014-08-31 DIAGNOSIS — E2839 Other primary ovarian failure: Secondary | ICD-10-CM

## 2014-08-31 DIAGNOSIS — I420 Dilated cardiomyopathy: Secondary | ICD-10-CM

## 2014-08-31 DIAGNOSIS — Z950 Presence of cardiac pacemaker: Secondary | ICD-10-CM | POA: Diagnosis not present

## 2014-08-31 DIAGNOSIS — R7309 Other abnormal glucose: Secondary | ICD-10-CM

## 2014-08-31 DIAGNOSIS — I428 Other cardiomyopathies: Secondary | ICD-10-CM

## 2014-08-31 DIAGNOSIS — Z79899 Other long term (current) drug therapy: Secondary | ICD-10-CM

## 2014-08-31 LAB — LIPID PANEL
Cholesterol: 134 mg/dL (ref 0–200)
HDL: 55 mg/dL (ref >39)
LDL CALC: 58 mg/dL (ref 0–99)
TRIGLYCERIDES: 105 mg/dL (ref <150)
Total CHOL/HDL Ratio: 2.4 Ratio
VLDL: 21 mg/dL (ref 0–40)

## 2014-08-31 LAB — HEMOGLOBIN A1C
HEMOGLOBIN A1C: 5.9 % — AB (ref <5.7)
MEAN PLASMA GLUCOSE: 123 mg/dL — AB (ref <117)

## 2014-08-31 LAB — BASIC METABOLIC PANEL WITH GFR
BUN: 20 mg/dL (ref 6–23)
CO2: 28 meq/L (ref 19–32)
Calcium: 9.8 mg/dL (ref 8.4–10.5)
Chloride: 101 mEq/L (ref 96–112)
Creat: 1.28 mg/dL — ABNORMAL HIGH (ref 0.50–1.10)
GFR, Est African American: 44 mL/min — ABNORMAL LOW
GFR, Est Non African American: 38 mL/min — ABNORMAL LOW
GLUCOSE: 105 mg/dL — AB (ref 70–99)
POTASSIUM: 4.9 meq/L (ref 3.5–5.3)
Sodium: 143 mEq/L (ref 135–145)

## 2014-08-31 LAB — CBC WITH DIFFERENTIAL/PLATELET
Basophils Absolute: 0 10*3/uL (ref 0.0–0.1)
Basophils Relative: 0 % (ref 0–1)
EOS ABS: 0.2 10*3/uL (ref 0.0–0.7)
Eosinophils Relative: 2 % (ref 0–5)
HEMATOCRIT: 38.2 % (ref 36.0–46.0)
HEMOGLOBIN: 12.9 g/dL (ref 12.0–15.0)
LYMPHS ABS: 1.8 10*3/uL (ref 0.7–4.0)
Lymphocytes Relative: 17 % (ref 12–46)
MCH: 30.4 pg (ref 26.0–34.0)
MCHC: 33.8 g/dL (ref 30.0–36.0)
MCV: 90.1 fL (ref 78.0–100.0)
MONOS PCT: 7 % (ref 3–12)
Monocytes Absolute: 0.7 10*3/uL (ref 0.1–1.0)
NEUTROS ABS: 7.8 10*3/uL — AB (ref 1.7–7.7)
Neutrophils Relative %: 74 % (ref 43–77)
Platelets: 277 10*3/uL (ref 150–400)
RBC: 4.24 MIL/uL (ref 3.87–5.11)
RDW: 12.8 % (ref 11.5–15.5)
WBC: 10.5 10*3/uL (ref 4.0–10.5)

## 2014-08-31 LAB — TSH: TSH: 2.346 u[IU]/mL (ref 0.350–4.500)

## 2014-08-31 LAB — HEPATIC FUNCTION PANEL
ALT: 10 U/L (ref 0–35)
AST: 17 U/L (ref 0–37)
Albumin: 4 g/dL (ref 3.5–5.2)
Alkaline Phosphatase: 52 U/L (ref 39–117)
BILIRUBIN DIRECT: 0.1 mg/dL (ref 0.0–0.3)
BILIRUBIN INDIRECT: 0.5 mg/dL (ref 0.2–1.2)
TOTAL PROTEIN: 6.5 g/dL (ref 6.0–8.3)
Total Bilirubin: 0.6 mg/dL (ref 0.2–1.2)

## 2014-08-31 LAB — MAGNESIUM: MAGNESIUM: 2 mg/dL (ref 1.5–2.5)

## 2014-08-31 MED ORDER — PANTOPRAZOLE SODIUM 40 MG PO TBEC
40.0000 mg | DELAYED_RELEASE_TABLET | Freq: Every day | ORAL | Status: DC
Start: 2014-08-31 — End: 2015-03-28

## 2014-08-31 NOTE — Patient Instructions (Signed)
[employer] with no obligation # [phone] Tues-Sat 10-6  Food Choices for Gastroesophageal Reflux Disease When you have gastroesophageal reflux disease (GERD), the foods you eat and your eating habits are very important. Choosing the right foods can help ease the discomfort of GERD. WHAT GENERAL GUIDELINES DO I NEED TO FOLLOW?  Choose fruits, vegetables, whole grains, low-fat dairy products, and low-fat meat, fish, and poultry.  Limit fats such as oils, salad dressings, butter, nuts, and avocado.  Keep a food diary to identify foods that cause symptoms.  Avoid foods that cause reflux. These may be different for different people.  Eat frequent small meals instead of three large meals each day.  Eat your meals slowly, in a relaxed setting.  Limit fried foods.  Cook foods using methods other than frying.  Avoid drinking alcohol.  Avoid drinking large amounts of liquids with your meals.  Avoid bending over or lying down until 2-3 hours after eating. WHAT FOODS ARE NOT RECOMMENDED? The following are some foods and drinks that may worsen your symptoms: Vegetables Tomatoes. Tomato juice. Tomato and spaghetti sauce. Chili peppers. Onion and garlic. Horseradish. Fruits Oranges, grapefruit, and lemon (fruit and juice). Meats High-fat meats, fish, and poultry. This includes hot dogs, ribs, ham, sausage, salami, and bacon. Dairy Whole milk and chocolate milk. Sour cream. Cream. Butter. Ice cream. Cream cheese.  Beverages Coffee and tea, with or without caffeine. Carbonated beverages or energy drinks. Condiments Hot sauce. Barbecue sauce.  Sweets/Desserts Chocolate and cocoa. Donuts. Peppermint and spearmint. Fats and Oils High-fat foods, including [place] fries and potato chips. Other Vinegar. Strong spices, such as black pepper, white pepper, red pepper, cayenne, curry powder, cloves, ginger, and chili powder. The items listed above may not be a complete list  of foods and beverages to avoid. Contact your dietitian for more information. Document Released: 11/25/2005 Document Revised: 11/30/2013 Document Reviewed: 09/29/2013 [facility] Patient Information 2015 [place], [place]. This information is not intended to replace advice given to you by your health care provider. Make sure you discuss any questions you have with your health care provider.

## 2014-08-31 NOTE — Progress Notes (Signed)
MEDICARE ANNUAL WELLNESS VISIT AND FOLLOW UP  Assessment:   1. Chronic systolic heart failure Continue to monitor/follow up with cardio  2. Essential hypertension - CBC with Differential - BASIC METABOLIC PANEL WITH GFR - Hepatic function panel - TSH  3. CKD (chronic kidney disease), stage III Check BMP  4. Biventricular cardiac pacemaker in situ Follow up Dr. Lovena Le  5. Encounter for long-term (current) use of other medications - Magnesium  6. Hyperlipidemia - Lipid panel  7. PreDiabetes Discussed general issues about diabetes pathophysiology and management., Educational material distributed., Suggested low cholesterol diet., Encouraged aerobic exercise., Discussed foot care., Reminded to get yearly retinal exam. - Hemoglobin A1c - HM DIABETES FOOT EXAM  8. Vitamin D Deficiency  9. Dilated cardiomyopathy, nonischemic CHF- If any increasing shortness of breath, swelling, or chest pressure go to ER immediately. Decrease your sodium intake to less than 2000 mg daily, decrease your fluid intake to less than 2 L daily, elevate legs, weight yourself daily and please remember to always increase your potassium intake with any increase of your fluid pill. Follow up with cardio.  10. Estrogen deficiency - DG Bone Density; Future  11. Screening for depression negative  12. Need for prophylactic vaccination against Streptococcus pneumoniae (pneumococcus) Prevnar 13  13. Atypical CP/palpitatoins, nonexertional Suggest follow up with Dr. Lovena Le for AICD reading however + epigastric pain and can be GERD- try protonix, RX sent in   Plan:   During the course of the visit the patient was educated and counseled about appropriate screening and preventive services including:    Pneumococcal vaccine   Influenza vaccine  Td vaccine  Screening electrocardiogram  Screening mammography  Bone densitometry screening  Colorectal cancer screening  Diabetes screening  Glaucoma  screening  Nutrition counseling   Advanced directives: given info/requested  Screening recommendations, referrals:  Vaccinations: Please see documentation below and orders placed this visit.   Nutrition assessed and recommended  Colonoscopy up to date Mammogram requested Pap smear not indicated Pelvic exam not indicated Recommended yearly ophthalmology/optometry visit for glaucoma screening and checkup Recommended yearly dental visit for hygiene and checkup Advanced directives - requested  Conditions/risks identified: BMI: Discussed weight loss, diet, and increase physical activity.  Increase physical activity: AHA recommends 150 minutes of physical activity a week.  Medications reviewed DEXA- ordered Diabetes is at goal, ACE/ARB therapy: Yes. Urinary Incontinence is an issue: discussed non pharmacology and pharmacology options.  Fall risk: moderate- discussed PT, home fall assessment, medications.    Subjective:   [name] is a 80 y.o. female who presents for Medicare Annual Wellness Visit and 3 month follow up on hypertension, prediabetes, hyperlipidemia, vitamin D def.  Date of last medicare wellness visit is unknown.   Her blood pressure has been controlled at home, today their BP is   She does not workout. She denies chest pain, shortness of breath, dizziness.  She is on cholesterol medication and denies myalgias. Her cholesterol is at goal. The cholesterol last visit was:   Lab Results  Component Value Date   CHOL 157 05/20/2014   HDL 61 05/20/2014   LDLCALC 73 05/20/2014   TRIG 117 05/20/2014   CHOLHDL 2.6 05/20/2014   She has been working on diet and exercise for prediabetes, and denies polydipsia and polyuria. Last A1C in the office was:  Lab Results  Component Value Date   HGBA1C 6.0* 05/20/2014   Patient is on Vitamin D supplement. Lab Results  Component Value Date   VD25OH 72 05/20/2014  She has a complicated heart history of NIDCMP s/p AICD, HTN,  aphasia, CKD, preDM.  She sees Dr. Wynonia Lawman, has had normal carotid US 2014, last Echo was 2014 showed EF 25%, mild MR, no thrombus. She is on plavix. Her weight is stable, she denies AICD shocks, she sleeps on a wedge pillow. She does complain of sinus symptoms for 1 week, she complains of constant SOB but this is unchanged. She states that she has had multiple episodes of nonexertional chest palpitations, lasts for seconds, with sitting, worse with bending over and she feels that she can not catch her breath at these times. She missed her last appointment with Dr. Lovena Le. Next appointment is end of Oct, checked over the phone 3 weeks ago.  Wt Readings from Last 3 Encounters:  08/31/14 151 lb (68.493 kg)  08/02/14 155 lb (70.308 kg)  07/21/14 153 lb (69.4 kg)    Names of Other Physician/Practitioners you currently use: 1. [facility]- here for primary care 2. Dr. Gwynn Burly II, eye doctor, last visit 1 week 3. Dr. Andree Elk dentist, last visit 3 months ago Patient Care Team: Unk Pinto, MD as PCP - General (Internal Medicine) Unk Pinto, MD (Internal Medicine) Jacolyn Reedy, MD as Consulting Physician (Cardiology) Wallene Huh, DPM as Consulting Physician (Podiatry) Larey Seat, MD as Consulting Physician (Neurology) G. Eston Mould II (Ophthalmology) Magnus Ivan. Andree Elk, DDS  Medication Review Current Outpatient Prescriptions on File Prior to Visit  Medication Sig Dispense Refill  . atorvastatin (LIPITOR) 80 MG tablet Take 1 tablet (80 mg total) by mouth daily.  30 tablet  11  . b complex vitamins capsule Take 1 capsule by mouth daily.       . bumetanide (BUMEX) 1 MG tablet Take 0.5 mg by mouth daily.      . carvedilol (COREG) 6.25 MG tablet Take 6.25 mg by mouth 2 (two) times daily with a meal.      . Cholecalciferol (VITAMIN D3) 1000 UNITS CAPS Take 1,000-2,000 Units by mouth daily. Take 1000mg  on Monday, Wednesday, and Friday. Takes  2000mg  all other days      . clopidogrel (PLAVIX) 75 MG tablet TAKE 1 TABLET BY MOUTH EVERY DAY TO PREVENT HEART ATTACK  30 tablet  11  . FREESTYLE LITE test strip TEST GLUCOSE ONCE DAILY AS DIRECTED  100 each  2  . gabapentin (NEURONTIN) 600 MG tablet TAKE 1 TABLET BY MOUTH AT BEDTIME FOR NEUROPATHY  90 tablet  1  . losartan (COZAAR) 50 MG tablet Take 25 mg by mouth daily.       . magnesium gluconate (MAGONATE) 500 MG tablet Take 500 mg by mouth daily.       . ranitidine (ZANTAC) 300 MG tablet Take 300 mg by mouth every morning.       No current facility-administered medications on file prior to visit.    Current Problems (verified) Patient Active Problem List   Diagnosis Date Noted  . PreDiabetes 05/21/2014  . Vitamin D Deficiency 05/20/2014  . Encounter for long-term (current) use of other medications 05/20/2014  . UTI (urinary tract infection) 06/23/2013  . Aphasia 06/21/2013  . CKD (chronic kidney disease), stage III 06/21/2013  . History of vasodepressor syncope   . Biventricular cardiac pacemaker in situ   . Hyperlipidemia   . Hypertension   . LBBB   . Chronic systolic heart failure   . Dilated cardiomyopathy, nonischemic     Screening Tests Health Maintenance  Topic Date  Due  . Foot Exam  09/24/1935  . Ophthalmology Exam  09/24/1935  . Zostavax  09/25/1987  . Tetanus/tdap  12/09/2012  . Influenza Vaccine  07/09/2014  . Urine Microalbumin  10/19/2014  . Hemoglobin A1c  11/19/2014  . Colonoscopy  06/08/2021  . Pneumococcal Polysaccharide Vaccine Age 25 And Over  Completed     Immunization History  Administered Date(s) Administered  . Influenza, High Dose Seasonal PF 10/19/2013  . Influenza-Unspecified 10/08/2012  . Pneumococcal-Unspecified 12/09/2002  . Td 12/09/2002    Preventative care: Last colonoscopy: 2012 Last mammogram: Dr. Marvel Plan- 2014  Last pap smear/pelvic exam: remote DEXA:long time ago CT head 06/2013 CXR 05/2014 Echo 06/2013 Carotid US  06/2013  Prior vaccinations: TD or Tdap: 2004 NEEDS- will defer until CPE Influenza: 2014 NEEDS - out of in the office will wait  Pneumococcal: 2004 WILL GIVE TODAY Shingles/Zostavax: declines  History reviewed: allergies, current medications, past family history, past medical history, past social history, past surgical history and problem list  Risk Factors: Osteoporosis: postmenopausal estrogen deficiency and dietary calcium and/or vitamin D deficiency History of fracture in the past year: no  Tobacco History  Substance Use Topics  . Smoking status: Passive Smoke Exposure - Never Smoker  . Smokeless tobacco: Never Used  . Alcohol Use: No   She does not smoke.  Patient is not a former smoker. Are there smokers in your home (other than you)?  Yes  Alcohol Current alcohol use: none  Caffeine Current caffeine use: denies use  Exercise Current exercise: walking  Nutrition/Diet Current diet: in general, a "healthy" diet    Cardiac risk factors: advanced age (older than 42 for men, 79 for women), dyslipidemia, family history of premature cardiovascular disease, hypertension and sedentary lifestyle.  Depression Screen (Note: if answer to either of the following is "Yes", a more complete depression screening is indicated)   Q1: Over the past two weeks, have you felt down, depressed or hopeless? No  Q2: Over the past two weeks, have you felt little interest or pleasure in doing things? No  Have you lost interest or pleasure in daily life? No  Do you often feel hopeless? No  Do you cry easily over simple problems? No  Activities of Daily Living In your present state of health, do you have any difficulty performing the following activities?:  Driving? Drives to church but only very close to house Managing money?  No Feeding yourself? No Getting from bed to chair? No Climbing a flight of stairs? No Preparing food and eating?: No Bathing or showering? No Getting dressed:  No Getting to the toilet? No Using the toilet:No Moving around from place to place: No In the past year have you fallen or had a near fall?:No   Are you sexually active?  No  Do you have more than one partner?  No  Vision Difficulties: No  Hearing Difficulties: Yes Do you often ask people to speak up or repeat themselves? Yes Do you experience ringing or noises in your ears? No Do you have difficulty understanding soft or whispered voices? Yes  Cognition  Do you feel that you have a problem with memory?Yes  Do you often misplace items? No  Do you feel safe at home?  Yes  Advanced directives Does patient have a [place]? Yes Does patient have a Living Will? Yes   Objective:   Pulse 60, temperature 98.5 F (36.9 C), resp. rate 16, height 5\' 3"  (1.6 m), weight 151  lb (68.493 kg). Body mass index is 26.76 kg/(m^2).  General appearance: alert, no distress, WD/WN,  female Cognitive Testing  Alert? Yes  Normal Appearance?Yes  Oriented to person? Yes  Place? Yes   Time? Yes  Recall of three objects?  2/3  Can perform simple calculations? Yes  Displays appropriate judgment?Yes  Can read the correct time from a watch face?Yes  HEENT: normocephalic, sclerae anicteric, TMs pearly, nares patent, no discharge or erythema, pharynx normal Oral cavity: MMM, no lesions Neck: supple, no lymphadenopathy, no thyromegaly, no masses Heart: RRR, normal S1, S2, no murmurs, AICD left upper chest Lungs: CTA bilaterally, no wheezes, rhonchi, or rales Abdomen: +bs, soft, epigastric tenderness, non distended, no masses, no hepatomegaly, no splenomegaly Musculoskeletal: nontender, no swelling, no obvious deformity Extremities: no edema, no cyanosis, no clubbing Pulses: 2+ symmetric, upper and lower extremities, normal cap refill Neurological: alert, oriented x 3, CN2-12 intact, strength normal upper extremities and lower extremities, sensation normal throughout, DTRs 2+  throughout, no cerebellar signs, gait normal Psychiatric: normal affect, behavior normal, pleasant  Breast: defer Gyn: defer Rectal: defer  Medicare Attestation I have personally reviewed: The patient's medical and social history Their use of alcohol, tobacco or illicit drugs Their current medications and supplements The patient's functional ability including ADLs,fall risks, home safety risks, cognitive, and hearing and visual impairment Diet and physical activities Evidence for depression or mood disorders  The patient's weight, height, BMI, and visual acuity have been recorded in the chart.  I have made referrals, counseling, and provided education to the patient based on review of the above and I have provided the patient with a written personalized care plan for preventive services.     Vicie Mutters, PA-C   08/31/2014

## 2014-09-06 ENCOUNTER — Encounter: Payer: Medicare Other | Admitting: Internal Medicine

## 2014-09-12 DIAGNOSIS — R102 Pelvic and perineal pain: Secondary | ICD-10-CM | POA: Diagnosis not present

## 2014-09-12 DIAGNOSIS — N952 Postmenopausal atrophic vaginitis: Secondary | ICD-10-CM | POA: Diagnosis not present

## 2014-09-13 ENCOUNTER — Encounter: Payer: Self-pay | Admitting: *Deleted

## 2014-09-19 DIAGNOSIS — R1031 Right lower quadrant pain: Secondary | ICD-10-CM | POA: Diagnosis not present

## 2014-09-19 DIAGNOSIS — R102 Pelvic and perineal pain: Secondary | ICD-10-CM | POA: Diagnosis not present

## 2014-09-22 DIAGNOSIS — Z95810 Presence of automatic (implantable) cardiac defibrillator: Secondary | ICD-10-CM | POA: Diagnosis not present

## 2014-09-27 DIAGNOSIS — H5340 Unspecified visual field defects: Secondary | ICD-10-CM | POA: Diagnosis not present

## 2014-10-04 ENCOUNTER — Ambulatory Visit (INDEPENDENT_AMBULATORY_CARE_PROVIDER_SITE_OTHER): Payer: Medicare Other | Admitting: Internal Medicine

## 2014-10-04 ENCOUNTER — Encounter: Payer: Self-pay | Admitting: Internal Medicine

## 2014-10-04 VITALS — BP 128/58 | HR 64 | Ht 63.0 in | Wt 156.0 lb

## 2014-10-04 DIAGNOSIS — I5022 Chronic systolic (congestive) heart failure: Secondary | ICD-10-CM

## 2014-10-04 DIAGNOSIS — Z950 Presence of cardiac pacemaker: Secondary | ICD-10-CM

## 2014-10-04 DIAGNOSIS — I429 Cardiomyopathy, unspecified: Secondary | ICD-10-CM

## 2014-10-04 DIAGNOSIS — I10 Essential (primary) hypertension: Secondary | ICD-10-CM | POA: Diagnosis not present

## 2014-10-04 DIAGNOSIS — I420 Dilated cardiomyopathy: Secondary | ICD-10-CM

## 2014-10-04 LAB — MDC_IDC_ENUM_SESS_TYPE_INCLINIC
Battery Remaining Longevity: 72 mo
Battery Voltage: 3.01 V
Brady Statistic AP VP Percent: 7.1 %
Brady Statistic AS VP Percent: 92.17 %
Brady Statistic RV Percent Paced: 99.26 %
Date Time Interrogation Session: 20151027114046
Lead Channel Impedance Value: 285 Ohm
Lead Channel Impedance Value: 399 Ohm
Lead Channel Impedance Value: 494 Ohm
Lead Channel Impedance Value: 570 Ohm
Lead Channel Pacing Threshold Amplitude: 0.375 V
Lead Channel Pacing Threshold Amplitude: 0.625 V
Lead Channel Pacing Threshold Amplitude: 1.375 V
Lead Channel Pacing Threshold Pulse Width: 0.4 ms
Lead Channel Sensing Intrinsic Amplitude: 1.375 mV
Lead Channel Sensing Intrinsic Amplitude: 1.5 mV
MDC IDC MSMT LEADCHNL LV IMPEDANCE VALUE: 418 Ohm
MDC IDC MSMT LEADCHNL LV IMPEDANCE VALUE: 437 Ohm
MDC IDC MSMT LEADCHNL LV IMPEDANCE VALUE: 551 Ohm
MDC IDC MSMT LEADCHNL LV PACING THRESHOLD PULSEWIDTH: 0.4 ms
MDC IDC MSMT LEADCHNL RA IMPEDANCE VALUE: 456 Ohm
MDC IDC MSMT LEADCHNL RA IMPEDANCE VALUE: 513 Ohm
MDC IDC MSMT LEADCHNL RV PACING THRESHOLD PULSEWIDTH: 0.4 ms
MDC IDC MSMT LEADCHNL RV SENSING INTR AMPL: 11.625 mV
MDC IDC MSMT LEADCHNL RV SENSING INTR AMPL: 12.375 mV
MDC IDC SET LEADCHNL LV PACING AMPLITUDE: 2.5 V
MDC IDC SET LEADCHNL LV PACING PULSEWIDTH: 0.4 ms
MDC IDC SET LEADCHNL RA PACING AMPLITUDE: 1.5 V
MDC IDC SET LEADCHNL RV PACING AMPLITUDE: 2 V
MDC IDC SET LEADCHNL RV PACING PULSEWIDTH: 0.4 ms
MDC IDC SET LEADCHNL RV SENSING SENSITIVITY: 0.9 mV
MDC IDC SET ZONE DETECTION INTERVAL: 400 ms
MDC IDC SET ZONE DETECTION INTERVAL: 400 ms
MDC IDC STAT BRADY AP VS PERCENT: 0.02 %
MDC IDC STAT BRADY AS VS PERCENT: 0.72 %
MDC IDC STAT BRADY RA PERCENT PACED: 7.11 %

## 2014-10-04 NOTE — Assessment & Plan Note (Signed)
Her heart failure is class 2. Her symptoms are well controlled. She will continue her current meds.

## 2014-10-04 NOTE — Assessment & Plan Note (Signed)
Her blood pressure is well controlled. No change in meds. 

## 2014-10-04 NOTE — Progress Notes (Signed)
HPI [name] returns today for followup.  She is a very pleasant 80 year old woman with a history of chronic systolic heart failure, status post ICD implantation, who underwent removal of her prior ICD and insertion of a biventricular pacemaker. She returns today for followup. She has done well in the interim, returning to gardening and working in her kitchen and yard. She denies chest pain or shortness of breath. No syncope. No edema. Allergies  Allergen Reactions  . Nexium [Esomeprazole Magnesium] Diarrhea     Current Outpatient Prescriptions  Medication Sig Dispense Refill  . atorvastatin (LIPITOR) 80 MG tablet Take 1 tablet (80 mg total) by mouth daily.  30 tablet  11  . b complex vitamins capsule Take 1 capsule by mouth daily.       . bumetanide (BUMEX) 1 MG tablet Take 0.5 mg by mouth daily.      . carvedilol (COREG) 6.25 MG tablet Take 6.25 mg by mouth 2 (two) times daily with a meal.      . Cholecalciferol (VITAMIN D3) 1000 UNITS CAPS Take 1,000-2,000 Units by mouth daily. Take 1000mg  on Monday, Wednesday, and Friday. Takes 2000mg  all other days      . clopidogrel (PLAVIX) 75 MG tablet TAKE 1 TABLET BY MOUTH EVERY DAY TO PREVENT HEART ATTACK  30 tablet  11  . FREESTYLE LITE test strip TEST GLUCOSE ONCE DAILY AS DIRECTED  100 each  2  . gabapentin (NEURONTIN) 600 MG tablet TAKE 1 TABLET BY MOUTH AT BEDTIME FOR NEUROPATHY  90 tablet  1  . losartan (COZAAR) 50 MG tablet Take 25 mg by mouth daily.       . magnesium gluconate (MAGONATE) 500 MG tablet Take 500 mg by mouth daily.       . pantoprazole (PROTONIX) 40 MG tablet Take 1 tablet (40 mg total) by mouth daily.  30 tablet  1  . ranitidine (ZANTAC) 300 MG tablet Take 300 mg by mouth every morning.       No current facility-administered medications for this visit.     Past Medical History  Diagnosis Date  . Secondary cardiomyopathy, unspecified   . Occlusion and stenosis of carotid artery without mention of cerebral infarction    . GERD (gastroesophageal reflux disease)   . Diabetes mellitus without complication   . A-fib   . Biventricular cardiac pacemaker in situ     05/06/13  Old AICD explanted and 6949 lead capped  RV lead  Medtronic [id number]  LV lead  Medtronic 4194  [id number] V Medtronic biventricular pacer  PVX [id number]   . Hyperlipidemia   . Peripheral neuropathy   . History of esophageal stricture   . History of duodenal ulcer   . Transient global amnesia   . Hypertension     ROS:   All systems reviewed and negative except as noted in the HPI.   Past Surgical History  Procedure Laterality Date  . Cardiac defibrillator placement  09/2004    MDT ICD implanted for primary prevention; device upgrade to CRTP in 04-2013  . Cardiac catheterization      04/2003  . Appendectomy    . Abdominal hysterectomy    . Biv pacemaker generator change out  05/05/2013    MDT CRTP upgrade (previously implanted dual chamber ICD) by Dr Lovena Le.  New RV/LV leads placed.   . Carpal tunnel release    . Cholecystectomy    . Breast surgery Left 1985    Negative  Biopsy  Family History  Problem Relation Age of Onset  . Cancer Maternal Grandmother     breast  . Dementia Mother   . Diabetes Mother   . Heart Problems Father   . Heart attack Son   . Heart disease Son   . Stroke Daughter   . Hypertension Daughter   . Hyperlipidemia Daughter   . Diabetes Daughter   . Diabetes Sister   . Hypertension Sister   . Hyperlipidemia Sister   . Heart disease Brother   . Heart attack Brother   . Hypertension Sister   . Hyperlipidemia Sister   . Cancer Sister     Breast  . Heart Problems Sister      History   Social History  . Marital Status: Widowed    Spouse Name: N/A    Number of Children: 2  . Years of Education: 12   Occupational History  . Not on file.   Social History Main Topics  . Smoking status: Passive Smoke Exposure - Never Smoker  . Smokeless tobacco: Never Used  . Alcohol Use: No  .  Drug Use: No  . Sexual Activity: Not on file   Other Topics Concern  . Not on file   Social History Narrative  . No narrative on file     BP 128/58  Pulse 64  Ht 5\' 3"  (1.6 m)  Wt 156 lb (70.761 kg)  BMI 27.64 kg/m2  Physical Exam:  Well appearing elderly woman, NAD HEENT: Unremarkable Neck:  7 cm JVD, no thyromegally Back:  No CVA tenderness Lungs:  Clear with no wheezes, rales, or rhonchi. Well-healed pacemaker incision. HEART:  Regular rate rhythm, no murmurs, no rubs, no clicks Abd:  soft, positive bowel sounds, no organomegally, no rebound, no guarding Ext:  2 plus pulses, no edema, no cyanosis, no clubbing Skin:  No rashes no nodules Neuro:  CN II through XII intact, motor grossly intact   DEVICE  Normal device function.  See PaceArt for details.   Assess/Plan:

## 2014-10-04 NOTE — Assessment & Plan Note (Signed)
Her BiV PM is working normally. Will recheck in several months. 

## 2014-10-04 NOTE — Patient Instructions (Signed)
Your physician wants you to follow-up in: 1 year with Dr. Taylor. You will receive a reminder letter in the mail two months in advance. If you don't receive a letter, please call our office to schedule the follow-up appointment.  Your physician recommends that you continue on your current medications as directed. Please refer to the Current Medication list given to you today.  

## 2014-10-20 ENCOUNTER — Encounter: Payer: Self-pay | Admitting: Internal Medicine

## 2014-10-25 DIAGNOSIS — R102 Pelvic and perineal pain: Secondary | ICD-10-CM | POA: Diagnosis not present

## 2014-11-14 ENCOUNTER — Other Ambulatory Visit: Payer: Self-pay | Admitting: *Deleted

## 2014-11-14 DIAGNOSIS — E782 Mixed hyperlipidemia: Secondary | ICD-10-CM

## 2014-11-14 MED ORDER — ATORVASTATIN CALCIUM 80 MG PO TABS: 80.0000 mg | ORAL_TABLET | Freq: Every day | ORAL | Status: DC

## 2014-11-17 ENCOUNTER — Encounter (HOSPITAL_COMMUNITY): Payer: Self-pay | Admitting: Internal Medicine

## 2014-11-25 ENCOUNTER — Other Ambulatory Visit: Payer: Self-pay | Admitting: Internal Medicine

## 2014-12-05 ENCOUNTER — Ambulatory Visit (INDEPENDENT_AMBULATORY_CARE_PROVIDER_SITE_OTHER): Payer: Medicare Other | Admitting: Internal Medicine

## 2014-12-05 ENCOUNTER — Encounter: Payer: Self-pay | Admitting: Internal Medicine

## 2014-12-05 VITALS — BP 128/62 | HR 76 | Temp 98.1°F | Resp 18 | Ht 63.0 in | Wt 153.4 lb

## 2014-12-05 DIAGNOSIS — Z79899 Other long term (current) drug therapy: Secondary | ICD-10-CM | POA: Diagnosis not present

## 2014-12-05 DIAGNOSIS — E559 Vitamin D deficiency, unspecified: Secondary | ICD-10-CM | POA: Diagnosis not present

## 2014-12-05 DIAGNOSIS — E785 Hyperlipidemia, unspecified: Secondary | ICD-10-CM | POA: Diagnosis not present

## 2014-12-05 DIAGNOSIS — K219 Gastro-esophageal reflux disease without esophagitis: Secondary | ICD-10-CM

## 2014-12-05 DIAGNOSIS — R300 Dysuria: Secondary | ICD-10-CM | POA: Diagnosis not present

## 2014-12-05 DIAGNOSIS — I2510 Atherosclerotic heart disease of native coronary artery without angina pectoris: Secondary | ICD-10-CM

## 2014-12-05 DIAGNOSIS — Z9181 History of falling: Secondary | ICD-10-CM

## 2014-12-05 DIAGNOSIS — R7309 Other abnormal glucose: Secondary | ICD-10-CM | POA: Diagnosis not present

## 2014-12-05 DIAGNOSIS — Z1331 Encounter for screening for depression: Secondary | ICD-10-CM

## 2014-12-05 DIAGNOSIS — I10 Essential (primary) hypertension: Secondary | ICD-10-CM

## 2014-12-05 DIAGNOSIS — R7303 Prediabetes: Secondary | ICD-10-CM

## 2014-12-05 DIAGNOSIS — Z1212 Encounter for screening for malignant neoplasm of rectum: Secondary | ICD-10-CM

## 2014-12-05 LAB — CBC WITH DIFFERENTIAL/PLATELET
BASOS ABS: 0.1 10*3/uL (ref 0.0–0.1)
Basophils Relative: 1 % (ref 0–1)
Eosinophils Absolute: 0.2 10*3/uL (ref 0.0–0.7)
Eosinophils Relative: 3 % (ref 0–5)
HEMATOCRIT: 37.9 % (ref 36.0–46.0)
Hemoglobin: 12.9 g/dL (ref 12.0–15.0)
LYMPHS ABS: 2.7 10*3/uL (ref 0.7–4.0)
Lymphocytes Relative: 33 % (ref 12–46)
MCH: 30.7 pg (ref 26.0–34.0)
MCHC: 34 g/dL (ref 30.0–36.0)
MCV: 90.2 fL (ref 78.0–100.0)
MONOS PCT: 7 % (ref 3–12)
MPV: 10.9 fL (ref 9.4–12.4)
Monocytes Absolute: 0.6 10*3/uL (ref 0.1–1.0)
Neutro Abs: 4.6 10*3/uL (ref 1.7–7.7)
Neutrophils Relative %: 56 % (ref 43–77)
PLATELETS: 247 10*3/uL (ref 150–400)
RBC: 4.2 MIL/uL (ref 3.87–5.11)
RDW: 13 % (ref 11.5–15.5)
WBC: 8.2 10*3/uL (ref 4.0–10.5)

## 2014-12-05 NOTE — Patient Instructions (Signed)
Recommend the book "The END of DIETING" by Dr Baker Janus   and the book "The END of DIABETES " by Dr Excell Seltzer  At [email or web address] - get book & Audio CD's      Being diabetic has a  300% increased risk for heart attack, stroke, cancer, and alzheimer- type vascular dementia. It is very important that you work harder with diet by avoiding all foods that are white except chicken & fish. Avoid white rice (brown & wild rice is OK), white potatoes (sweetpotatoes in moderation is OK), White bread or wheat bread or anything made out of white flour like bagels, donuts, rolls, buns, biscuits, cakes, pastries, cookies, pizza crust, and pasta (made from white flour & egg whites) - vegetarian pasta or spinach or wheat pasta is OK. Multigrain breads like Arnold's or [employer], or multigrain sandwich thins or flatbreads.  Diet, exercise and weight loss can reverse and cure diabetes in the early stages.  Diet, exercise and weight loss is very important in the control and prevention of complications of diabetes which affects every system in your body, ie. Brain - dementia/stroke, eyes - glaucoma/blindness, heart - heart attack/heart failure, kidneys - dialysis, stomach - gastric paralysis, intestines - malabsorption, nerves - severe painful neuritis, circulation - gangrene & loss of a leg(s), and finally cancer and Alzheimers.    I recommend avoid fried & greasy foods,  sweets/candy, white rice (brown or wild rice or Quinoa is OK), white potatoes (sweet potatoes are OK) - anything made from white flour - bagels, doughnuts, rolls, buns, biscuits,white and wheat breads, pizza crust and traditional pasta made of white flour & egg white(vegetarian pasta or spinach or wheat pasta is OK).  Multi-grain bread is OK - like multi-grain flat bread or sandwich thins. Avoid alcohol in excess. Exercise is also important.    Eat all the vegetables you want - avoid meat, especially red meat and dairy - especially cheese.  Cheese  is the most concentrated form of trans-fats which is the worst thing to clog up our arteries. Veggie cheese is OK which can be found in the fresh produce section at [employer] or [employer] or [employer]  Preventive Care for Adults A healthy lifestyle and preventive care can promote health and wellness. Preventive health guidelines for women include the following key practices.  A routine yearly physical is a good way to check with your health care provider about your health and preventive screening. It is a chance to share any concerns and updates on your health and to receive a thorough exam.  Visit your dentist for a routine exam and preventive care every 6 months. Brush your teeth twice a day and floss once a day. Good oral hygiene prevents tooth decay and gum disease.  The frequency of eye exams is based on your age, health, family medical history, use of contact lenses, and other factors. Follow your health care provider's recommendations for frequency of eye exams.  Eat a healthy diet. Foods like vegetables, fruits, whole grains, low-fat dairy products, and lean protein foods contain the nutrients you need without too many calories. Decrease your intake of foods high in solid fats, added sugars, and salt. Eat the right amount of calories for you.Get information about a proper diet from your health care provider, if necessary.  Regular physical exercise is one of the most important things you can do for your health. Most adults should get at least 150 minutes of moderate-intensity exercise (any activity that increases  your heart rate and causes you to sweat) each week. In addition, most adults need muscle-strengthening exercises on 2 or more days a week.  Maintain a healthy weight. The body mass index (BMI) is a screening tool to identify possible weight problems. It provides an estimate of body fat based on height and weight. Your health care provider can find your BMI and can help you  achieve or maintain a healthy weight.For adults 20 years and older:  A BMI below 18.5 is considered underweight.  A BMI of 18.5 to 24.9 is normal.  A BMI of 25 to 29.9 is considered overweight.  A BMI of 30 and above is considered obese.  Maintain normal blood lipids and cholesterol levels by exercising and minimizing your intake of saturated fat. Eat a balanced diet with plenty of fruit and vegetables. If your lipid or cholesterol levels are high, you are over 50, or you are at high risk for heart disease, you may need your cholesterol levels checked more frequently.Ongoing high lipid and cholesterol levels should be treated with medicines if diet and exercise are not working.  If you smoke, find out from your health care provider how to quit. If you do not use tobacco, do not start.  Lung cancer screening is recommended for adults aged 58-80 years who are at high risk for developing lung cancer because of a history of smoking. A yearly low-dose CT scan of the lungs is recommended for people who have at least a 30-pack-year history of smoking and are a current smoker or have quit within the past 15 years. A pack year of smoking is smoking an average of 1 pack of cigarettes a day for 1 year (for example: 1 pack a day for 30 years or 2 packs a day for 15 years). Yearly screening should continue until the smoker has stopped smoking for at least 15 years. Yearly screening should be stopped for people who develop a health problem that would prevent them from having lung cancer treatment.  Avoid use of street drugs. Do not share needles with anyone. Ask for help if you need support or instructions about stopping the use of drugs.  High blood pressure causes heart disease and increases the risk of stroke.  Ongoing high blood pressure should be treated with medicines if weight loss and exercise do not work.  If you are 27-64 years old, ask your health care provider if you should take aspirin to  prevent strokes.  Diabetes screening involves taking a blood sample to check your fasting blood sugar level. This should be done once every 3 years, after age 11, if you are within normal weight and without risk factors for diabetes. Testing should be considered at a younger age or be carried out more frequently if you are overweight and have at least 1 risk factor for diabetes.  Breast cancer screening is essential preventive care for women. You should practice "breast self-awareness." This means understanding the normal appearance and feel of your breasts and may include breast self-examination. Any changes detected, no matter how small, should be reported to a health care provider. Women in their 46s and 30s should have a clinical breast exam (CBE) by a health care provider as part of a regular health exam every 1 to 3 years. After age 64, women should have a CBE every year. Starting at age 47, women should consider having a mammogram (breast X-ray test) every year. Women who have a family history of breast cancer should  talk to their health care provider about genetic screening. Women at a high risk of breast cancer should talk to their health care providers about having an MRI and a mammogram every year.  Breast cancer gene (BRCA)-related cancer risk assessment is recommended for women who have family members with BRCA-related cancers. BRCA-related cancers include breast, ovarian, tubal, and peritoneal cancers. Having family members with these cancers may be associated with an increased risk for harmful changes (mutations) in the breast cancer genes BRCA1 and BRCA2. Results of the assessment will determine the need for genetic counseling and BRCA1 and BRCA2 testing.  Routine pelvic exams to screen for cancer are no longer recommended for nonpregnant women who are considered low risk for cancer of the pelvic organs (ovaries, uterus, and vagina) and who do not have symptoms. Ask your health care provider  if a screening pelvic exam is right for you.  If you have had past treatment for cervical cancer or a condition that could lead to cancer, you need Pap tests and screening for cancer for at least 20 years after your treatment. If Pap tests have been discontinued, your risk factors (such as having a new sexual partner) need to be reassessed to determine if screening should be resumed. Some women have medical problems that increase the chance of getting cervical cancer. In these cases, your health care provider may recommend more frequent screening and Pap tests.    Colorectal cancer can be detected and often prevented. Most routine colorectal cancer screening begins at the age of 90 years and continues through age 8 years. However, your health care provider may recommend screening at an earlier age if you have risk factors for colon cancer. On a yearly basis, your health care provider may provide home test kits to check for hidden blood in the stool. Use of a small camera at the end of a tube, to directly examine the colon (sigmoidoscopy or colonoscopy), can detect the earliest forms of colorectal cancer. Talk to your health care provider about this at age 86, when routine screening begins. Direct exam of the colon should be repeated every 5-10 years through age 32 years, unless early forms of pre-cancerous polyps or small growths are found.  Osteoporosis is a disease in which the bones lose minerals and strength with aging. This can result in serious bone fractures or breaks. The risk of osteoporosis can be identified using a bone density scan. Women ages 75 years and over and women at risk for fractures or osteoporosis should discuss screening with their health care providers. Ask your health care provider whether you should take a calcium supplement or vitamin D to reduce the rate of osteoporosis.  Menopause can be associated with physical symptoms and risks. Hormone replacement therapy is available to  decrease symptoms and risks. You should talk to your health care provider about whether hormone replacement therapy is right for you.  Use sunscreen. Apply sunscreen liberally and repeatedly throughout the day. You should seek shade when your shadow is shorter than you. Protect yourself by wearing long sleeves, pants, a wide-brimmed hat, and sunglasses year round, whenever you are outdoors.  Once a month, do a whole body skin exam, using a mirror to look at the skin on your back. Tell your health care provider of new moles, moles that have irregular borders, moles that are larger than a pencil eraser, or moles that have changed in shape or color.  Stay current with required vaccines (immunizations).  Influenza vaccine. All adults  should be immunized every year.  Tetanus, diphtheria, and acellular pertussis (Td, Tdap) vaccine. Pregnant women should receive 1 dose of Tdap vaccine during each pregnancy. The dose should be obtained regardless of the length of time since the last dose. Immunization is preferred during the 27th-36th week of gestation. An adult who has not previously received Tdap or who does not know her vaccine status should receive 1 dose of Tdap. This initial dose should be followed by tetanus and diphtheria toxoids (Td) booster doses every 10 years. Adults with an unknown or incomplete history of completing a 3-dose immunization series with Td-containing vaccines should begin or complete a primary immunization series including a Tdap dose. Adults should receive a Td booster every 10 years.    Zoster vaccine. One dose is recommended for adults aged 44 years or older unless certain conditions are present.    Pneumococcal 13-valent conjugate (PCV13) vaccine. When indicated, a person who is uncertain of her immunization history and has no record of immunization should receive the PCV13 vaccine. An adult aged 74 years or older who has certain medical conditions and has not been previously  immunized should receive 1 dose of PCV13 vaccine. This PCV13 should be followed with a dose of pneumococcal polysaccharide (PPSV23) vaccine. The PPSV23 vaccine dose should be obtained at least 8 weeks after the dose of PCV13 vaccine. An adult aged 66 years or older who has certain medical conditions and previously received 1 or more doses of PPSV23 vaccine should receive 1 dose of PCV13. The PCV13 vaccine dose should be obtained 1 or more years after the last PPSV23 vaccine dose.    Pneumococcal polysaccharide (PPSV23) vaccine. When PCV13 is also indicated, PCV13 should be obtained first. All adults aged 89 years and older should be immunized. An adult younger than age 80 years who has certain medical conditions should be immunized. Any person who resides in a nursing home or long-term care facility should be immunized. An adult smoker should be immunized. People with an immunocompromised condition and certain other conditions should receive both PCV13 and PPSV23 vaccines. People with human immunodeficiency virus (HIV) infection should be immunized as soon as possible after diagnosis. Immunization during chemotherapy or radiation therapy should be avoided. Routine use of PPSV23 vaccine is not recommended for American Indians, [place] Natives, or people younger than 65 years unless there are medical conditions that require PPSV23 vaccine. When indicated, people who have unknown immunization and have no record of immunization should receive PPSV23 vaccine. One-time revaccination 5 years after the first dose of PPSV23 is recommended for people aged 19-64 years who have chronic kidney failure, nephrotic syndrome, asplenia, or immunocompromised conditions. People who received 1-2 doses of PPSV23 before age 34 years should receive another dose of PPSV23 vaccine at age 31 years or later if at least 5 years have passed since the previous dose. Doses of PPSV23 are not needed for people immunized with PPSV23 at or after  age 29 years.   Preventive Services / Frequency  Ages 55 years and over  Blood pressure check.  Lipid and cholesterol check.  Lung cancer screening. / Every year if you are aged 29-80 years and have a 30-pack-year history of smoking and currently smoke or have quit within the past 15 years. Yearly screening is stopped once you have quit smoking for at least 15 years or develop a health problem that would prevent you from having lung cancer treatment.  Clinical breast exam.** / Every year after age 40 years.  BRCA-related cancer risk assessment.** / For women who have family members with a BRCA-related cancer (breast, ovarian, tubal, or peritoneal cancers).  Mammogram.** / Every year beginning at age 40 years and continuing for as long as you are in good health. Consult with your health care provider.  Pap test.** / Every 3 years starting at age 30 years through age 65 or 70 years with 3 consecutive normal Pap tests. Testing can be stopped between 65 and 70 years with 3 consecutive normal Pap tests and no abnormal Pap or HPV tests in the past 10 years.  Fecal occult blood test (FOBT) of stool. / Every year beginning at age 50 years and continuing until age 75 years. You may not need to do this test if you get a colonoscopy every 10 years.  Flexible sigmoidoscopy or colonoscopy.** / Every 5 years for a flexible sigmoidoscopy or every 10 years for a colonoscopy beginning at age 50 years and continuing until age 75 years.  Hepatitis C blood test.** / For all people born from 1945 through 1965 and any individual with known risks for hepatitis C.  Osteoporosis screening.** / A one-time screening for women ages 65 years and over and women at risk for fractures or osteoporosis.  Skin self-exam. / Monthly.  Influenza vaccine. / Every year.  Tetanus, diphtheria, and acellular pertussis (Tdap/Td) vaccine.** / 1 dose of Td every 10 years.  Zoster vaccine.** / 1 dose for adults aged 60 years  or older.  Pneumococcal 13-valent conjugate (PCV13) vaccine.** / Consult your health care provider.  Pneumococcal polysaccharide (PPSV23) vaccine.** / 1 dose for all adults aged 65 years and older. Screening for abdominal aortic aneurysm (AAA)  by ultrasound is recommended for people who have history of high blood pressure or who are current or former smokers. 

## 2014-12-05 NOTE — Progress Notes (Signed)
Patient ID: [name], female   DOB: [birth year], 80 y.o.   MRN: [id number]  Annual Comprehensive Examination  This very nice 80 y.o.WWF presents for complete physical.  Patient has been followed for HTN, ASHD/Ch CHF, AICD/Pacemaker, Prediabetes, Hyperlipidemia, and Vitamin D Deficiency.    HTN predates since 31. In 2004 patient had a heart cath finding a Cardiomyopathy with systolic CHF and she had a AICD Pacemaker placed. Patient reports ongoing symptoms of near syncope with bending & rapid standing. She had a (-) Stress Myoview in 2010Patient's BP has been controlled at home and patient denies any cardiac symptoms as chest pain, palpitations, shortness of breath, dizziness or ankle swelling. Today's BP: 128/62 mmHg    Patient's hyperlipidemia is controlled with diet and medications. Patient denies myalgias or other medication SE's. Last lipids were at goal - Total Chol 134; HDL  55; LDL 58; Triglycerides 105 on 08/31/2014.   Patient has  prediabetes predating since 2012 with A1c 6.0% and patient denies reactive hypoglycemic symptoms, visual blurring, diabetic polys, or paresthesias. Last A1c was 5.9% on 08/31/2014.   Finally, patient has history of Vitamin D Deficiency of 33 in 2008 and last Vitamin D was 72 on 05/20/2014.  Medication Sig  . atorvastatin  80 MG tablet Take 1 tablet (80 mg total) by mouth daily.  Marland Kitchen b complex vitamins Take 1 capsule by mouth daily.   . bumetanide  1 MG tablet Take 0.5 mg by mouth daily.  . carvedilol 6.25 MG tablet Take 6.25 mg by mouth 2 (two) times daily with a meal.  . VITAMIN D 1000 UNITS  Take 1,000-2,000 Units by mouth daily. Take 1000mg  on Monday, Wednesday, and Friday. Takes 2000mg  all other days  . clopidogrel (PLAVIX) 75 MG tablet TAKE 1 TABLET BY MOUTH EVERY DAY TO PREVENT HEART ATTACK  . gabapentin  600 MG  TAKE 1 TABLET BY MOUTH AT BEDTIME FOR NEUROPATHY  . losartan (COZAAR) 50 MG tablet Take 25 mg by mouth daily.   . magnesium gluconate  500  MG tablet Take 500 mg by mouth daily.   . pantoprazole (PROTONIX) 40 MG tablet Take 1 tablet (40 mg total) by mouth daily.  . ranitidine (ZANTAC) 300 MG tablet TAKE 1 TABLET TWICE A DAY FOR REFLUX   Allergies  Allergen Reactions  . Nexium [Esomeprazole Magnesium] Diarrhea   Past Medical History  Diagnosis Date  . Secondary cardiomyopathy, unspecified   . Occlusion and stenosis of carotid artery without mention of cerebral infarction   . GERD (gastroesophageal reflux disease)   . Diabetes mellitus without complication   . A-fib   . Biventricular cardiac pacemaker in situ     05/06/13  Old AICD explanted and 6949 lead capped  RV lead  Medtronic 5076 [id number]  LV lead  Medtronic 4194  [id number] V Medtronic biventricular pacer  PVX [id number]   . Hyperlipidemia   . Peripheral neuropathy   . History of esophageal stricture   . History of duodenal ulcer   . Transient global amnesia   . Hypertension    Health Maintenance  Topic Date Due  . OPHTHALMOLOGY EXAM  09/24/1937  . ZOSTAVAX  09/25/1987  . DEXA SCAN  09/24/1992  . TETANUS/TDAP  12/09/2012  . INFLUENZA VACCINE  07/09/2014  . URINE MICROALBUMIN  10/19/2014  . HEMOGLOBIN A1C  03/01/2015  . FOOT EXAM  09/01/2015  . COLONOSCOPY  06/08/2021  . PNEUMOCOCCAL POLYSACCHARIDE VACCINE AGE 48 AND OVER  Completed   Immunization History  Administered Date(s)  Administered  . Influenza, High Dose Seasonal PF 10/19/2013  . Influenza-Unspecified 10/08/2012  . Pneumococcal Conjugate-13 08/31/2014  . Pneumococcal-Unspecified 12/09/2002  . Td 12/09/2002   Past Surgical History  Procedure Laterality Date  . Cardiac defibrillator placement  09/2004    MDT ICD implanted for primary prevention; device upgrade to CRTP in 04-2013  . Cardiac catheterization      04/2003  . Appendectomy    . Abdominal hysterectomy    . Biv pacemaker generator change out  05/05/2013    MDT CRTP upgrade (previously implanted dual chamber ICD) by Dr Lovena Le.  New RV/LV  leads placed.   . Carpal tunnel release    . Cholecystectomy    . Breast surgery Left 1985    Negative  Biopsy  . Bi-ventricular pacemaker upgrade N/A 05/05/2013    Procedure: BI-VENTRICULAR PACEMAKER UPGRADE;  Surgeon: Evans Lance, MD;  Location: [facility] CATH LAB;  Service: Cardiovascular;  Laterality: N/A;   Family History  Problem Relation Age of Onset  . Cancer Maternal Grandmother     breast  . Dementia Mother   . Diabetes Mother   . Heart Problems Father   . Heart attack Son   . Heart disease Son   . Stroke Daughter   . Hypertension Daughter   . Hyperlipidemia Daughter   . Diabetes Daughter   . Diabetes Sister   . Hypertension Sister   . Hyperlipidemia Sister   . Heart disease Brother   . Heart attack Brother   . Hypertension Sister   . Hyperlipidemia Sister   . Cancer Sister     Breast  . Heart Problems Sister    History  Substance Use Topics  . Smoking status: Passive Smoke Exposure - Never Smoker  . Smokeless tobacco: Never Used  . Alcohol Use: No    ROS Constitutional: Denies fever, chills, weight loss/gain, headaches, insomnia, fatigue, night sweats, and change in appetite. Eyes: Denies redness, blurred vision, diplopia, discharge, itchy, watery eyes.  ENT: Denies discharge, congestion, post nasal drip, epistaxis, sore throat, earache, hearing loss, dental pain, Tinnitus, Vertigo, Sinus pain, snoring.  Cardio: Denies chest pain, palpitations, irregular heartbeat, syncope, dyspnea, diaphoresis, orthopnea, PND, claudication, edema Respiratory: denies cough, dyspnea, DOE, pleurisy, hoarseness, laryngitis, wheezing.  Gastrointestinal: Denies dysphagia, heartburn, reflux, water brash, pain, cramps, nausea, vomiting, bloating, diarrhea, constipation, hematemesis, melena, hematochezia, jaundice, hemorrhoids Genitourinary: Denies dysuria, frequency, urgency, nocturia, hesitancy, discharge, hematuria, flank pain Breast: Breast lumps, nipple discharge, bleeding.   Musculoskeletal: Denies arthralgia, myalgia, stiffness, Jt. Swelling, pain, limp, and strain/sprain. Denies falls. Skin: Denies puritis, rash, hives, warts, acne, eczema, changing in skin lesion Neuro: No weakness, tremor, incoordination, spasms, paresthesia, pain Psychiatric: Denies confusion, memory loss, sensory loss. Denies Depression. Endocrine: Denies change in weight, skin, hair change, nocturia, and paresthesia, diabetic polys, visual blurring, hyper / hypo glycemic episodes.  Heme/Lymph: No excessive bleeding, bruising, enlarged lymph nodes.  Physical Exam  BP 128/62   Pulse 76  Temp 98.1 F   Resp 18  Ht 5\' 3"    Wt 153 lb 6.4 oz     BMI 27.18   General Appearance: Over  nourished and in no apparent distress. Eyes: PERRLA, EOMs, conjunctiva no swelling or erythema, normal fundi and vessels. Sinuses: No frontal/maxillary tenderness ENT/Mouth: EACs patent / TMs  nl. Nares clear without erythema, swelling, mucoid exudates. Oral hygiene is good. No erythema, swelling, or exudate. Tongue normal, non-obstructing. Tonsils not swollen or erythematous. Hearing normal.  Neck: Supple, thyroid normal. No bruits, nodes or  JVD. Respiratory: Respiratory effort normal.  BS equal and clear bilateral without rales, rhonci, wheezing or stridor. Cardio: Heart sounds are normal with regular rate and rhythm and no murmurs, rubs or gallops. Peripheral pulses are 1+ and equal bilaterally without edema. No aortic or femoral bruits. Chest: symmetric with normal excursions and percussion. Breasts: Symmetric, without lumps, nipple discharge, retractions, or fibrocystic changes.  Abdomen: Flat, soft, with bowl sounds. Nontender, no guarding, rebound, hernias, masses, or organomegaly.  Lymphatics: Non tender without lymphadenopathy.  Musculoskeletal: Full ROM all peripheral extremities, joint stability, 5/5 strength, and normal gait. Skin: Warm and dry without rashes, lesions, cyanosis, clubbing or   ecchymosis.  Neuro: Cranial nerves intact, reflexes equal bilaterally. Normal muscle tone, no cerebellar symptoms. Sensation intact.  Pysch: Awake and oriented X 3, normal affect, Insight and Judgment appropriate.   Assessment and Plan  1. Essential hypertension    - Microalbumin / creatinine urine ratio - EKG 12-Lead - 100% paced - [place], RETROPERITNL ABD,  LTD - TSH  2. Hyperlipidemia  - Lipid panel  3. Prediabetes  - Hemoglobin A1c - Insulin, fasting  4. Vitamin D deficiency  - Vit D  25 hydroxy   5. ASHD / Cardiomyopathy - Chronic Sys CHF  - Perm Cardiac Pacemaker-AICD    6. Screening for rectal cancer  - POC Hemoccult Bld/Stl   7. Gastroesophageal reflux disease   8. At low risk for fall   9. Medication management  - Urine Microscopic - CBC with Differential - BASIC METABOLIC PANEL WITH GFR - Hepatic function panel - Magnesium   10. Depression screen  - Screen Negative  11. Dysuria  - Urine culture  12. Chronic Posturally Related Near Syncope     Advised withholding her diuretic and drinking lots more water to see if helps with her tendency for postural near syncope - unless she develops swelling of the ankles or Orthopnea/PND type sx's.   Continue prudent diet as discussed, weight control, BP monitoring, regular exercise, and medications. Discussed med's effects and SE's. Screening labs and tests as requested with regular follow-up as recommended.

## 2014-12-06 LAB — LIPID PANEL
CHOLESTEROL: 134 mg/dL (ref 0–200)
HDL: 46 mg/dL (ref >39)
LDL Cholesterol: 13 mg/dL (ref 0–99)
Total CHOL/HDL Ratio: 2.9 Ratio
Triglycerides: 375 mg/dL — ABNORMAL HIGH (ref <150)
VLDL: 75 mg/dL — AB (ref 0–40)

## 2014-12-06 LAB — HEMOGLOBIN A1C
Hgb A1c MFr Bld: 6 % — ABNORMAL HIGH (ref <5.7)
Mean Plasma Glucose: 126 mg/dL — ABNORMAL HIGH (ref <117)

## 2014-12-06 LAB — URINALYSIS, MICROSCOPIC ONLY
Bacteria, UA: NONE SEEN
CASTS: NONE SEEN
CRYSTALS: NONE SEEN
Squamous Epithelial / LPF: NONE SEEN

## 2014-12-06 LAB — BASIC METABOLIC PANEL WITH GFR
BUN: 18 mg/dL (ref 6–23)
CHLORIDE: 101 meq/L (ref 96–112)
CO2: 29 mEq/L (ref 19–32)
Calcium: 9.6 mg/dL (ref 8.4–10.5)
Creat: 1.33 mg/dL — ABNORMAL HIGH (ref 0.50–1.10)
GFR, EST AFRICAN AMERICAN: 41 mL/min — AB
GFR, Est Non African American: 36 mL/min — ABNORMAL LOW
Glucose, Bld: 81 mg/dL (ref 70–99)
Potassium: 4.5 mEq/L (ref 3.5–5.3)
Sodium: 143 mEq/L (ref 135–145)

## 2014-12-06 LAB — HEPATIC FUNCTION PANEL
ALBUMIN: 4.1 g/dL (ref 3.5–5.2)
ALK PHOS: 53 U/L (ref 39–117)
ALT: 15 U/L (ref 0–35)
AST: 20 U/L (ref 0–37)
BILIRUBIN DIRECT: 0.1 mg/dL (ref 0.0–0.3)
Indirect Bilirubin: 0.4 mg/dL (ref 0.2–1.2)
Total Bilirubin: 0.5 mg/dL (ref 0.2–1.2)
Total Protein: 6.6 g/dL (ref 6.0–8.3)

## 2014-12-06 LAB — URINE CULTURE
Colony Count: NO GROWTH
ORGANISM ID, BACTERIA: NO GROWTH

## 2014-12-06 LAB — MICROALBUMIN / CREATININE URINE RATIO
CREATININE, URINE: 32.9 mg/dL
MICROALB UR: 0.2 mg/dL (ref <2.0)
Microalb Creat Ratio: 6.1 mg/g (ref 0.0–30.0)

## 2014-12-06 LAB — INSULIN, FASTING: Insulin fasting, serum: 12.4 u[IU]/mL (ref 2.0–19.6)

## 2014-12-06 LAB — TSH: TSH: 2.735 u[IU]/mL (ref 0.350–4.500)

## 2014-12-06 LAB — MAGNESIUM: Magnesium: 2 mg/dL (ref 1.5–2.5)

## 2014-12-06 LAB — VITAMIN D 25 HYDROXY (VIT D DEFICIENCY, FRACTURES): Vit D, 25-Hydroxy: 48 ng/mL (ref 30–100)

## 2014-12-12 ENCOUNTER — Encounter: Payer: Self-pay | Admitting: Neurology

## 2014-12-12 ENCOUNTER — Ambulatory Visit (INDEPENDENT_AMBULATORY_CARE_PROVIDER_SITE_OTHER): Payer: Medicare Other | Admitting: Neurology

## 2014-12-12 VITALS — BP 155/71 | HR 66 | Resp 14 | Ht 63.25 in | Wt 152.0 lb

## 2014-12-12 DIAGNOSIS — R1031 Right lower quadrant pain: Secondary | ICD-10-CM

## 2014-12-12 DIAGNOSIS — R1030 Lower abdominal pain, unspecified: Secondary | ICD-10-CM

## 2014-12-12 HISTORY — DX: Right lower quadrant pain: R10.31

## 2014-12-12 NOTE — Progress Notes (Signed)
Provider:  Larey Seat, M D  Referring Provider: Unk Pinto, MD Primary Care Physician:  Alesia Richards, MD  Chief Complaint  Patient presents with  . NP (Manny)  Neuropathy    Rm 10, alone    HPI:  [name] is a 81 y.o. female  Is seen here as a referral  from Dr. Melford Aase and Dr.  Ophelia Charter for a  "new onset" right  groin pain.  I have seen Mrs. Rumbley in 2011 when she had near-syncope spells and again in 2013.  The patient is now referred for a different problem.  She is a known diabetic with some diabetic peripheral neuropathy but developed a right groin pain that she describes as so excruciating and deep that she almost cried when she when she felt it.  This is a paroxysmal pain ofsudden onset and she states this appeared about a year or more ago-  3 times total -  she has not had any spells in the last 14 month! . This affected the right groin only and extended towards the pelvic floor. It lasted only seconds - surely less than a minute each of the 3 times. She denies chronic pain. She stated today : I wish i would never mentioned it, because now I see yet another specialist" .  Her urologist cleared her and her Gynecologist did,  too.   She is depressed appearing, and may be she is giving me different information form her PCP? She has a son, daughter and sister with diabetes.    She is not overweight. She was seen in hospital for TIA work up last year, following a speech arrest spell , 05-05-13 - previously she was seen here for syncope.   Dr. Wynonia Lawman and Lovena Le follows for CHF, pacemaker/ formerly defibrillator patient .   Review of Systems: Out of a complete 14 system review, the patient complains of only the following symptoms, and all other reviewed systems are negative.  remote 3 spells of groin pain. Denies back pain, abdominal pain or pelvic pain, has atrophic vaginitis.   History   Social History  . Marital Status: Widowed    Spouse Name: N/A      Number of Children: 2  . Years of Education: 12   Occupational History  . Not on file.   Social History Main Topics  . Smoking status: Passive Smoke Exposure - Never Smoker  . Smokeless tobacco: Never Used     Comment: worked at [employer]  . Alcohol Use: No  . Drug Use: No  . Sexual Activity: Not on file   Other Topics Concern  . Not on file   Social History Narrative   Caffeine Daily average (3 cups am).  Retired.    Widowed.  6 KIDS.    Family History  Problem Relation Age of Onset  . Cancer Maternal Grandmother     breast  . Dementia Mother   . Diabetes Mother   . Heart Problems Father   . Heart attack Son   . Heart disease Son   . Stroke Daughter   . Hypertension Daughter   . Hyperlipidemia Daughter   . Diabetes Daughter   . Diabetes Sister   . Hypertension Sister   . Hyperlipidemia Sister   . Heart disease Brother   . Heart attack Brother   . Hypertension Sister   . Hyperlipidemia Sister   . Cancer Sister     Breast  . Heart Problems Sister  Past Medical History  Diagnosis Date  . Secondary cardiomyopathy, unspecified   . Occlusion and stenosis of carotid artery without mention of cerebral infarction   . GERD (gastroesophageal reflux disease)   . Diabetes mellitus without complication   . A-fib   . Biventricular cardiac pacemaker in situ     05/06/13  Old AICD explanted and 6949 lead capped  RV lead  Medtronic 5076 [id number]  LV lead  Medtronic 4194  [id number] V Medtronic biventricular pacer  PVX [id number]   . Hyperlipidemia   . Peripheral neuropathy   . History of esophageal stricture   . History of duodenal ulcer   . Transient global amnesia   . Hypertension   . Right groin pain 12/12/2014    Past Surgical History  Procedure Laterality Date  . Cardiac defibrillator placement  09/2004    MDT ICD implanted for primary prevention; device upgrade to CRTP in 04-2013  . Cardiac catheterization      04/2003  . Appendectomy    . Abdominal  hysterectomy    . Biv pacemaker generator change out  05/05/2013    MDT CRTP upgrade (previously implanted dual chamber ICD) by Dr Lovena Le.  New RV/LV leads placed.   . Carpal tunnel release    . Cholecystectomy    . Breast surgery Left 1985    Negative  Biopsy  . Bi-ventricular pacemaker upgrade N/A 05/05/2013    Procedure: BI-VENTRICULAR PACEMAKER UPGRADE;  Surgeon: Evans Lance, MD;  Location: [facility] CATH LAB;  Service: Cardiovascular;  Laterality: N/A;    Current Outpatient Prescriptions  Medication Sig Dispense Refill  . carvedilol (COREG) 6.25 MG tablet Take 6.25 mg by mouth 2 (two) times daily with a meal.    . Cholecalciferol (VITAMIN D3) 1000 UNITS CAPS Take 1,000-2,000 Units by mouth daily. Take 1000mg  on Monday, Wednesday, and Friday. Takes 2000mg  all other days    . clopidogrel (PLAVIX) 75 MG tablet TAKE 1 TABLET BY MOUTH EVERY DAY TO PREVENT HEART ATTACK 30 tablet 11  . FREESTYLE LITE test strip TEST GLUCOSE ONCE DAILY AS DIRECTED 100 each 2  . gabapentin (NEURONTIN) 600 MG tablet TAKE 1 TABLET BY MOUTH AT BEDTIME FOR NEUROPATHY 90 tablet 1  . losartan (COZAAR) 50 MG tablet Take 25 mg by mouth daily.     . magnesium gluconate (MAGONATE) 500 MG tablet Take 500 mg by mouth daily.     . pantoprazole (PROTONIX) 40 MG tablet Take 1 tablet (40 mg total) by mouth daily. 30 tablet 1  . ranitidine (ZANTAC) 300 MG tablet TAKE 1 TABLET TWICE A DAY FOR REFLUX 180 tablet 1  . atorvastatin (LIPITOR) 80 MG tablet Take 1 tablet (80 mg total) by mouth daily. 30 tablet 3  . b complex vitamins capsule Take 1 capsule by mouth daily.     . bumetanide (BUMEX) 1 MG tablet Take 0.5 mg by mouth daily.     No current facility-administered medications for this visit.    Allergies as of 12/12/2014 - Review Complete 12/12/2014  Allergen Reaction Noted  . Nexium [esomeprazole magnesium] Diarrhea 10/16/2013    Vitals: BP 155/71 mmHg  Pulse 66  Resp 14  Ht 5' 3.25" (1.607 m)  Wt 152 lb (68.947 kg)   BMI 26.70 kg/m2 Last Weight:  Wt Readings from Last 1 Encounters:  12/12/14 152 lb (68.947 kg)   Last Height:   Ht Readings from Last 1 Encounters:  12/12/14 5' 3.25" (1.607 m)    Physical exam:  General: The patient  is awake, alert and appears not in acute distress. The patient is well groomed. Head: Normocephalic, atraumatic. Neck is supple. Mallampati 2,  Cardiovascular:  Paced  rate and rhythm, without  murmurs or carotid bruit, and without distended neck veins. Respiratory: Lungs are clear to auscultation. Skin:  Without evidence of edema, or rash Trunk:patient  has normal posture.  Neurologic exam : The patient is awake and alert, oriented to place and time.   Memory subjective  described as intact.  There is a normal attention span & concentration ability. Speech is fluent without  dysarthria, dysphonia or aphasia. Mood and affect are sad, worried .   Cranial nerves: Pupils are equal reactive to light. Extraocular movements  in vertical and horizontal planes intact and without nystagmus.  Visual fields by finger perimetry are intact. Hearing to finger rub lost on the right, and some decrease in the left.  Facial sensation intact to fine touch. Facial motor strength is symmetric and tongue and uvula move midline.  Tongue protrusion into either cheek is normal. Shoulder shrug is normal.   Motor exam:   Normal tone ,muscle bulk and symmetric strength in all extremities. Left shoulder pain and chest pain is related to pacemaker location.   Sensory:  Fine touch, pinprick and vibration were tested in all extremities. Vibration is preserved to the midfoot and toes! .  Proprioception was normal.  Coordination: Rapid alternating movements in the fingers/hands, Finger-to-nose maneuver without evidence of ataxia, dysmetria or tremor.  Gait and station: Patient walks without assistive device and is able unassisted to climb up to the exam table. Strength within normal limits.    Stance is stable and normal. Tandem gait is unfragmented. Romberg testing is negative   Deep tendon reflexes: in the  upper and lower extremities are symmetric and intact. Babinski maneuver response is downgoing.   Assessment:  After physical and neurologic examination, review of laboratory studies, imaging, neurophysiology testing and pre-existing records, assessment is that of Groin pain, right sided, not chronic, paroxysmal - of unclear cause . This is not an acute condition. The patient is already on neurontin. I offered a EMG and NCS but the patient understood that this has likely little to no therapeutic consequence or yield.  She is thinking about it.    Plan:  Treatment plan and additional workup :  EMG and NCV were offered, No follow up planned.      Asencion Partridge Murrel Freet MD 12/12/2014

## 2014-12-19 ENCOUNTER — Telehealth: Payer: Self-pay | Admitting: Internal Medicine

## 2014-12-19 NOTE — Telephone Encounter (Signed)
Patient is complaining of pain and discomfort at her pacemaker site and in the left arm. Patient has had this discomfort for over a month. Patient feels that she can not wait to see Dr. Lovena Le in February, which is the first available office visit. Tried to see if patient could be put on Flex for tomorrow, but was not able to be seen. Will forward to Dr. Lovena Le and Janan Halter RN for further instructions.

## 2014-12-19 NOTE — Telephone Encounter (Signed)
New Message  Pt complaining of discomfort with Pcmkr and wants OV w/ Dr. Lovena Le; pt is aware that next avail is feb 3. Pt requested to speak with Rn. Please call back to discuss.

## 2014-12-20 NOTE — Telephone Encounter (Signed)
Will offer patient and appointment with Dr Lovena Le tomorrow

## 2014-12-21 ENCOUNTER — Encounter: Payer: Self-pay | Admitting: Internal Medicine

## 2014-12-21 ENCOUNTER — Ambulatory Visit (INDEPENDENT_AMBULATORY_CARE_PROVIDER_SITE_OTHER): Payer: Medicare Other | Admitting: Internal Medicine

## 2014-12-21 VITALS — BP 132/50 | HR 63 | Ht 63.0 in | Wt 150.8 lb

## 2014-12-21 DIAGNOSIS — Z950 Presence of cardiac pacemaker: Secondary | ICD-10-CM | POA: Diagnosis not present

## 2014-12-21 DIAGNOSIS — I429 Cardiomyopathy, unspecified: Secondary | ICD-10-CM | POA: Diagnosis not present

## 2014-12-21 DIAGNOSIS — I420 Dilated cardiomyopathy: Secondary | ICD-10-CM

## 2014-12-21 DIAGNOSIS — I5022 Chronic systolic (congestive) heart failure: Secondary | ICD-10-CM

## 2014-12-21 LAB — MDC_IDC_ENUM_SESS_TYPE_INCLINIC
Battery Voltage: 3.01 V
Brady Statistic AP VP Percent: 6.32 %
Brady Statistic AS VP Percent: 92.87 %
Brady Statistic RV Percent Paced: 99.19 %
Date Time Interrogation Session: 20160113092423
Lead Channel Impedance Value: 285 Ohm
Lead Channel Impedance Value: 399 Ohm
Lead Channel Impedance Value: 437 Ohm
Lead Channel Impedance Value: 475 Ohm
Lead Channel Impedance Value: 494 Ohm
Lead Channel Impedance Value: 532 Ohm
Lead Channel Impedance Value: 570 Ohm
Lead Channel Pacing Threshold Amplitude: 0.5 V
Lead Channel Pacing Threshold Amplitude: 0.5 V
Lead Channel Pacing Threshold Amplitude: 1.25 V
Lead Channel Pacing Threshold Pulse Width: 0.4 ms
Lead Channel Pacing Threshold Pulse Width: 0.4 ms
Lead Channel Sensing Intrinsic Amplitude: 1 mV
Lead Channel Sensing Intrinsic Amplitude: 1.125 mV
Lead Channel Setting Pacing Amplitude: 2 V
Lead Channel Setting Pacing Amplitude: 2.5 V
MDC IDC MSMT BATTERY REMAINING LONGEVITY: 71 mo
MDC IDC MSMT LEADCHNL LV IMPEDANCE VALUE: 418 Ohm
MDC IDC MSMT LEADCHNL LV IMPEDANCE VALUE: 570 Ohm
MDC IDC MSMT LEADCHNL RV PACING THRESHOLD PULSEWIDTH: 0.4 ms
MDC IDC MSMT LEADCHNL RV SENSING INTR AMPL: 10.375 mV
MDC IDC MSMT LEADCHNL RV SENSING INTR AMPL: 10.5 mV
MDC IDC SET LEADCHNL LV PACING AMPLITUDE: 2.25 V
MDC IDC SET LEADCHNL LV PACING PULSEWIDTH: 0.4 ms
MDC IDC SET LEADCHNL RV PACING PULSEWIDTH: 0.4 ms
MDC IDC SET LEADCHNL RV SENSING SENSITIVITY: 0.9 mV
MDC IDC SET ZONE DETECTION INTERVAL: 400 ms
MDC IDC STAT BRADY AP VS PERCENT: 0.01 %
MDC IDC STAT BRADY AS VS PERCENT: 0.79 %
MDC IDC STAT BRADY RA PERCENT PACED: 6.33 %
Zone Setting Detection Interval: 400 ms

## 2014-12-21 MED ORDER — IBUPROFEN 400 MG PO TABS: ORAL_TABLET | ORAL | Status: DC

## 2014-12-21 NOTE — Assessment & Plan Note (Signed)
She has pain around her PPM as well as medial to it on the chest wall. There is no erythema or swelling. I suspect costochondritis. I have asked her to take motrin 400 mg daily with meals and she may repeat. Will see her as previously scheduled back in followup.

## 2014-12-21 NOTE — Progress Notes (Signed)
HPI [name] returns today for followup.  She is a very pleasant 81 year old woman with a history of chronic systolic heart failure, status post ICD implantation, who underwent removal of her prior ICD and insertion of a biventricular pacemaker. She returns today for followup. She has experienced some chest pain over her PPM insertion site. She also has arthritic symptoms with bilateral knee pain. She denies shortness of breath. No syncope. No edema. No fever or chills. Allergies  Allergen Reactions  . Nexium [Esomeprazole Magnesium] Diarrhea     Current Outpatient Prescriptions  Medication Sig Dispense Refill  . atorvastatin (LIPITOR) 80 MG tablet Take 1 tablet (80 mg total) by mouth daily. 30 tablet 3  . b complex vitamins capsule Take 1 capsule by mouth daily.     . carvedilol (COREG) 6.25 MG tablet Take 6.25 mg by mouth 2 (two) times daily with a meal.    . Cholecalciferol (VITAMIN D3) 1000 UNITS CAPS Take 1,000-2,000 Units by mouth daily. Take 1000mg  on Monday, Wednesday, and Friday. Takes 2000mg  all other days    . clopidogrel (PLAVIX) 75 MG tablet TAKE 1 TABLET BY MOUTH EVERY DAY TO PREVENT HEART ATTACK 30 tablet 11  . FREESTYLE LITE test strip TEST GLUCOSE ONCE DAILY AS DIRECTED 100 each 2  . gabapentin (NEURONTIN) 600 MG tablet TAKE 1 TABLET BY MOUTH AT BEDTIME FOR NEUROPATHY 90 tablet 1  . losartan (COZAAR) 50 MG tablet Take 25 mg by mouth daily.     . magnesium gluconate (MAGONATE) 500 MG tablet Take 500 mg by mouth daily.     . pantoprazole (PROTONIX) 40 MG tablet Take 1 tablet (40 mg total) by mouth daily. 30 tablet 1  . ranitidine (ZANTAC) 300 MG tablet TAKE 1 TABLET TWICE A DAY FOR REFLUX (Patient taking differently: TAKE 1 TABLET BY MOUTH TWICE A DAY FOR REFLUX) 180 tablet 1   No current facility-administered medications for this visit.     Past Medical History  Diagnosis Date  . Secondary cardiomyopathy, unspecified   . Occlusion and stenosis of carotid artery without  mention of cerebral infarction   . GERD (gastroesophageal reflux disease)   . Diabetes mellitus without complication   . A-fib   . Biventricular cardiac pacemaker in situ     05/06/13  Old AICD explanted and 6949 lead capped  RV lead  Medtronic 5076 [id number]  LV lead  Medtronic 4194  [id number] V Medtronic biventricular pacer  PVX [id number]   . Hyperlipidemia   . Peripheral neuropathy   . History of esophageal stricture   . History of duodenal ulcer   . Transient global amnesia   . Hypertension   . Right groin pain 12/12/2014    ROS:   All systems reviewed and negative except as noted in the HPI.   Past Surgical History  Procedure Laterality Date  . Cardiac defibrillator placement  09/2004    MDT ICD implanted for primary prevention; device upgrade to CRTP in 04-2013  . Cardiac catheterization      04/2003  . Appendectomy    . Abdominal hysterectomy    . Biv pacemaker generator change out  05/05/2013    MDT CRTP upgrade (previously implanted dual chamber ICD) by Dr Lovena Le.  New RV/LV leads placed.   . Carpal tunnel release    . Cholecystectomy    . Breast surgery Left 1985    Negative  Biopsy  . Bi-ventricular pacemaker upgrade N/A 05/05/2013    Procedure: BI-VENTRICULAR PACEMAKER UPGRADE;  Surgeon: Champ Mungo  Lovena Le, MD;  Location: [facility] CATH LAB;  Service: Cardiovascular;  Laterality: N/A;     Family History  Problem Relation Age of Onset  . Cancer Maternal Grandmother     breast  . Dementia Mother   . Diabetes Mother   . Heart Problems Father   . Heart attack Son   . Heart disease Son   . Stroke Daughter   . Hypertension Daughter   . Hyperlipidemia Daughter   . Diabetes Daughter   . Diabetes Sister   . Hypertension Sister   . Hyperlipidemia Sister   . Heart disease Brother   . Heart attack Brother   . Hypertension Sister   . Hyperlipidemia Sister   . Cancer Sister     Breast  . Heart Problems Sister      History   Social History  . Marital Status: Widowed     Spouse Name: N/A    Number of Children: 2  . Years of Education: 12   Occupational History  . Not on file.   Social History Main Topics  . Smoking status: Passive Smoke Exposure - Never Smoker  . Smokeless tobacco: Never Used     Comment: worked at [employer]  . Alcohol Use: No  . Drug Use: No  . Sexual Activity: Not on file   Other Topics Concern  . Not on file   Social History Narrative   Caffeine Daily average (3 cups am).  Retired.    Widowed.  6 KIDS.  Lives with daughter at her home.     There were no vitals taken for this visit.  Physical Exam:  Well appearing elderly woman, NAD HEENT: Unremarkable Neck:  7 cm JVD, no thyromegally Back:  No CVA tenderness Lungs:  Clear with no wheezes, rales, or rhonchi. Well-healed pacemaker incision. Tenderness over her device with no erythema or swelling HEART:  Regular rate rhythm, no murmurs, no rubs, no clicks Abd:  soft, positive bowel sounds, no organomegally, no rebound, no guarding Ext:  2 plus pulses, no edema, no cyanosis, no clubbing Skin:  No rashes no nodules Neuro:  CN II through XII intact, motor grossly intact   DEVICE  Normal device function.  See PaceArt for details.   Assess/Plan:

## 2014-12-21 NOTE — Assessment & Plan Note (Signed)
Her symptoms remain class 2. She will continue her current meds. She is encouraged to reduce her sodium intake.

## 2014-12-21 NOTE — Assessment & Plan Note (Signed)
Her Biv PPM is working normally. Will recheck in several months.

## 2014-12-21 NOTE — Patient Instructions (Addendum)
Your physician recommends that you schedule a follow-up appointment in March with Dr. Lovena Le.  Your physician has recommended you make the following change in your medication:  1) START Ibuprofen 400 mg, 2 tablets, by mouth once daily, as needed for chest pain

## 2015-01-17 ENCOUNTER — Other Ambulatory Visit: Payer: Self-pay | Admitting: Physician Assistant

## 2015-02-03 DIAGNOSIS — Z23 Encounter for immunization: Secondary | ICD-10-CM | POA: Diagnosis not present

## 2015-02-13 ENCOUNTER — Encounter: Payer: Self-pay | Admitting: Cardiology

## 2015-02-13 DIAGNOSIS — I951 Orthostatic hypotension: Secondary | ICD-10-CM | POA: Diagnosis not present

## 2015-02-13 DIAGNOSIS — I5022 Chronic systolic (congestive) heart failure: Secondary | ICD-10-CM | POA: Diagnosis not present

## 2015-02-13 DIAGNOSIS — Z79899 Other long term (current) drug therapy: Secondary | ICD-10-CM | POA: Diagnosis not present

## 2015-02-13 DIAGNOSIS — I6529 Occlusion and stenosis of unspecified carotid artery: Secondary | ICD-10-CM | POA: Diagnosis not present

## 2015-02-13 DIAGNOSIS — Z8673 Personal history of transient ischemic attack (TIA), and cerebral infarction without residual deficits: Secondary | ICD-10-CM | POA: Diagnosis not present

## 2015-02-13 DIAGNOSIS — Z95810 Presence of automatic (implantable) cardiac defibrillator: Secondary | ICD-10-CM | POA: Diagnosis not present

## 2015-02-13 DIAGNOSIS — I420 Dilated cardiomyopathy: Secondary | ICD-10-CM | POA: Diagnosis not present

## 2015-02-13 DIAGNOSIS — K219 Gastro-esophageal reflux disease without esophagitis: Secondary | ICD-10-CM | POA: Diagnosis not present

## 2015-02-13 DIAGNOSIS — R0602 Shortness of breath: Secondary | ICD-10-CM | POA: Diagnosis not present

## 2015-02-13 DIAGNOSIS — E785 Hyperlipidemia, unspecified: Secondary | ICD-10-CM | POA: Diagnosis not present

## 2015-02-13 DIAGNOSIS — I10 Essential (primary) hypertension: Secondary | ICD-10-CM | POA: Diagnosis not present

## 2015-02-13 NOTE — Progress Notes (Unsigned)
Patient ID: [name], female   DOB: [birth year], 81 y.o.   MRN: [id number]   [name], [name]  Date of visit:  02/13/2015 DOB:  1925-09-06    Age:  81 yrs. Medical record number:  [id number]     Account number:  [id number] Primary Care Provider: Unk Pinto D ____________________________ CURRENT DIAGNOSES  1. Dilated cardiomyopathy  2. Chronic systolic (congestive) heart failure  3. Occlusion and stenosis of unspecified carotid artery  4. Orthostatic hypotension  5. Hyperlipidemia, unspecified  6. Essential (primary) hypertension  7. Presence of automatic (implantable) cardiac defibrillator  8. Personal history of transient ischemic attack (TIA), and cerebral infarction without residual deficits  9. Gastro-esophageal reflux disease without esophagitis  10. Shortness of breath  11. Other long term (current) drug therapy ____________________________ ALLERGIES  Esomeprazole, Intolerance-unknown  Spironolactone, Intolerance-dizziness ____________________________ MEDICATIONS  1. magnesium 250 mg tablet, 1 p.o. daily  2. Vitamin B-100 Complex tablet, 1 p.o. daily  3. clopidogrel 75 mg tablet, 1 p.o. daily  4. losartan 50 mg tablet, 1/4 tab qd  5. ranitidine 300 mg tablet, 1 p.o. daily  6. gabapentin 600 mg tablet, QHS  7. atorvastatin 80 mg tablet, 1/2 tab m w f  8. carvedilol 6.25 mg tablet, BID  9. Vitamin D3 1,000 unit capsule, 2 p.o. daily ____________________________ CHIEF COMPLAINTS  Followup of Cardiomyopathy Idiopathic  Followup of Essential (primary) hypertension ____________________________ HISTORY OF PRESENT ILLNESS Patient seen for cardiac followup. She had significant fatigue and is somewhat unsteady on her feet. She has had no defibrillator discharges. She denies angina and has no PND, orthopnea or edema. Blood pressure has been under fair control. Is able to be fairly active otherwise. She has had some issues with more hearing loss  recently. ____________________________ PAST HISTORY  Past Medical Illnesses:  hypertension, hyperlipidemia, GERD, history of esophageal stricture, history of duodenal ulcer, transient amnesia, peripheral neuropathy;  Cardiovascular Illnesses:  conduction disorder-LBBB, cardiomyopathy(idiopathic), arrhythmia-PVCs, history of SVT, orthostatic hypotension;  Surgical Procedures:  cholecystectomy, cesarean section, hysterectomy, carpal tunnel release, left ear surgery;  NYHA Classification:  II;  Canadian Angina Classification:  Class 0: Asymptomatic;  Cardiology Procedures-Invasive:  cardiac cath (left) May 2004, Medtronic AICD insertion October 2005, generator change March 2008, biventricular Medtronic insertion May 2014;  Cardiology Procedures-Noninvasive:  adenosine cardiolite April 2004, regadenoson thallium May 2010, echocardiogram 2014;  Cardiac Cath Results:  no significant disease;  LVEF of 25% documented via echocardiogram on 04/16/2101,   ____________________________ CARDIO-PULMONARY TEST DATES EKG Date:  02/13/2015;   Cardiac Cath Date:  04/14/2003;  Nuclear Study Date:  05/04/2009;  Echocardiography Date: 06/22/2013;  Chest Xray Date: 06/21/2013;   ____________________________ FAMILY HISTORY Brother -- Brother dead, Unknown disease Brother -- Brother dead, Myocardial infarction at less than 59 Brother -- Brother dead, CVA Father -- Father dead, Coronary Artery Disease, Deceased Mother -- Mother dead, Coronary Artery Disease, Diabetes mellitus Sister -- Sister alive with problem, Breast disease, Hypertension Sister -- Sister alive and well Son -- Son dead ____________________________ SOCIAL HISTORY Alcohol Use:  no alcohol use;  Smoking:  never smoked;  Diet:  regular diet without modifications;  Lifestyle:  widowed;  Exercise:  gardening;  Occupation:  retired Astronomer;  Residence:  lives with daughter;   ____________________________ REVIEW OF SYSTEMS General:  malaise and fatigue  Eyes: wears eye glasses/contact lenses, cataract extraction Ears, Nose, Throat, Mouth:  partial hearing loss left ear Respiratory: mild dyspnea with exertion Cardiovascular:  please review HPI Abdominal: occasional heartburn and dysphagia Musculoskeletal:  arthritis of  the left arm Neurological:  dizziness, memory deficit, peripheral neuropathy  ____________________________ PHYSICAL EXAMINATION VITAL SIGNS  Blood Pressure:  154/60 Sitting, Right arm, regular cuff  , 150/60 Standing, Right arm and regular cuff   Pulse:  76/min. Weight:  152.00 lbs. Height:  63"BMI: 27  Constitutional:  pleasant white female, in no acute distress, mildly obese Skin:  warm and dry to touch, no apparent skin lesions, or masses noted. Head:  normocephalic, normal hair pattern, no masses or tenderness ENT:  ears, nose and throat reveal no gross abnormalities.  Dentition good. Neck:  supple, no masses, thyromegaly, JVD. Carotid pulses are full and equal bilaterally without bruits. Chest:  normal symmetry, clear to auscultation, healed ICD incision in the left pectoral area Cardiac:  regular rhythm, normal S1 and S2, no S3 or S4, grade 1/6 systolic murmur Peripheral Pulses:  pulses full and equal in all extremities Extremities & Back:  no edema present Neurological:  no gross motor or sensory deficits noted ____________________________ MOST RECENT LIPID PANEL 03/23/13  CHOL TOTL 184 mg/dl, LDL 98 calc, HDL 58 mg/dl, TRIGLYCER 141 mg/dl and CHOL/HDL 3.2 (Calc) ____________________________ IMPRESSIONS/PLAN  1. Idiopathic cardiomyopathy 2. Hypertension somewhat above goal today but normotensive at home 3. Functioning implantable defibrillator  Recommendations:  Clinically doing well but has age related dysfunction. Recommended followup in 6 months. EKG shows sinus rhythm with paced ventricular complex ____________________________ TODAYS ORDERS  1. 12 Lead EKG: Today  2. Return Visit: 6 months                        ____________________________ Cardiology Physician:  Kerry Hough MD [facility]

## 2015-03-24 ENCOUNTER — Encounter: Payer: Self-pay | Admitting: Physician Assistant

## 2015-03-24 ENCOUNTER — Ambulatory Visit (INDEPENDENT_AMBULATORY_CARE_PROVIDER_SITE_OTHER): Payer: Medicare Other | Admitting: Physician Assistant

## 2015-03-24 ENCOUNTER — Other Ambulatory Visit: Payer: Self-pay

## 2015-03-24 VITALS — BP 136/60 | HR 62 | Temp 98.6°F | Resp 18 | Ht 63.0 in | Wt 152.0 lb

## 2015-03-24 DIAGNOSIS — E785 Hyperlipidemia, unspecified: Secondary | ICD-10-CM

## 2015-03-24 DIAGNOSIS — M25551 Pain in right hip: Secondary | ICD-10-CM

## 2015-03-24 DIAGNOSIS — R7303 Prediabetes: Secondary | ICD-10-CM

## 2015-03-24 DIAGNOSIS — Z79899 Other long term (current) drug therapy: Secondary | ICD-10-CM

## 2015-03-24 DIAGNOSIS — I10 Essential (primary) hypertension: Secondary | ICD-10-CM | POA: Diagnosis not present

## 2015-03-24 DIAGNOSIS — R7309 Other abnormal glucose: Secondary | ICD-10-CM

## 2015-03-24 DIAGNOSIS — E559 Vitamin D deficiency, unspecified: Secondary | ICD-10-CM

## 2015-03-24 LAB — TSH: TSH: 2.881 u[IU]/mL (ref 0.350–4.500)

## 2015-03-24 LAB — LIPID PANEL
Cholesterol: 141 mg/dL (ref 0–200)
HDL: 62 mg/dL (ref >=46)
LDL CALC: 51 mg/dL (ref 0–99)
Total CHOL/HDL Ratio: 2.3 Ratio
Triglycerides: 139 mg/dL (ref <150)
VLDL: 28 mg/dL (ref 0–40)

## 2015-03-24 LAB — MAGNESIUM: MAGNESIUM: 2 mg/dL (ref 1.5–2.5)

## 2015-03-24 LAB — HEMOGLOBIN A1C
Hgb A1c MFr Bld: 6.1 % — ABNORMAL HIGH
Mean Plasma Glucose: 128 mg/dL — ABNORMAL HIGH

## 2015-03-24 LAB — CBC WITH DIFFERENTIAL/PLATELET
Basophils Absolute: 0.1 10*3/uL (ref 0.0–0.1)
Basophils Relative: 1 % (ref 0–1)
Eosinophils Absolute: 0.2 10*3/uL (ref 0.0–0.7)
Eosinophils Relative: 2 % (ref 0–5)
HCT: 39.1 % (ref 36.0–46.0)
Hemoglobin: 13.1 g/dL (ref 12.0–15.0)
LYMPHS ABS: 1.9 10*3/uL (ref 0.7–4.0)
LYMPHS PCT: 24 % (ref 12–46)
MCH: 30.1 pg (ref 26.0–34.0)
MCHC: 33.5 g/dL (ref 30.0–36.0)
MCV: 89.9 fL (ref 78.0–100.0)
MPV: 11.2 fL (ref 8.6–12.4)
Monocytes Absolute: 0.5 10*3/uL (ref 0.1–1.0)
Monocytes Relative: 6 % (ref 3–12)
NEUTROS ABS: 5.2 10*3/uL (ref 1.7–7.7)
Neutrophils Relative %: 67 % (ref 43–77)
PLATELETS: 249 10*3/uL (ref 150–400)
RBC: 4.35 MIL/uL (ref 3.87–5.11)
RDW: 13 % (ref 11.5–15.5)
WBC: 7.8 10*3/uL (ref 4.0–10.5)

## 2015-03-24 LAB — HEPATIC FUNCTION PANEL
ALT: 15 U/L (ref 0–35)
AST: 20 U/L (ref 0–37)
Albumin: 4.1 g/dL (ref 3.5–5.2)
Alkaline Phosphatase: 62 U/L (ref 39–117)
BILIRUBIN DIRECT: 0.1 mg/dL (ref 0.0–0.3)
BILIRUBIN TOTAL: 0.7 mg/dL (ref 0.2–1.2)
Indirect Bilirubin: 0.6 mg/dL (ref 0.2–1.2)
Total Protein: 6.3 g/dL (ref 6.0–8.3)

## 2015-03-24 LAB — BASIC METABOLIC PANEL WITH GFR
BUN: 12 mg/dL (ref 6–23)
CALCIUM: 9.5 mg/dL (ref 8.4–10.5)
CO2: 27 mEq/L (ref 19–32)
Chloride: 106 mEq/L (ref 96–112)
Creat: 1.24 mg/dL — ABNORMAL HIGH (ref 0.50–1.10)
GFR, Est African American: 45 mL/min — ABNORMAL LOW
GFR, Est Non African American: 39 mL/min — ABNORMAL LOW
Glucose, Bld: 102 mg/dL — ABNORMAL HIGH (ref 70–99)
Potassium: 4.4 mEq/L (ref 3.5–5.3)
SODIUM: 142 meq/L (ref 135–145)

## 2015-03-24 MED ORDER — TIZANIDINE HCL 4 MG PO TABS: 4.0000 mg | ORAL_TABLET | Freq: Every day | ORAL | Status: DC

## 2015-03-24 NOTE — Progress Notes (Signed)
Assessment and Plan:  1. Hypertension -Continue medication, monitor blood pressure at home. Continue DASH diet.  Reminder to go to the ER if any CP, SOB, nausea, dizziness, severe HA, changes vision/speech, left arm numbness and tingling and jaw pain.  2. Cholesterol -Continue diet and exercise. Check cholesterol.   3. Prediabetes  -Continue diet and exercise. Check A1C  4. Vitamin D Def - check level and continue medications.   5. RLQ pain/Pelvic pain Has had a significant work up with urology, GYN, neuro, and GI Will try low dose zanaflex at night and will see back in 2 weeks Need to get Dr. Ophelia Charter records to see if any imaging done to rule out hernia From symptoms seems like pelvic floor/muscle strain however previous notes states no cystocele/rectocele. Will do pelvic exam next OV.   Continue diet and meds as discussed. Further disposition pending results of labs. Over 30 minutes of exam, counseling, chart review, and critical decision making was performed  HPI 81 y.o. female  presents for 3 month follow up on hypertension, cholesterol, prediabetes, and vitamin D deficiency.   Her blood pressure has been controlled at home, today their BP is BP: 136/60 mmHg  She does not workout. She denies chest pain, shortness of breath, dizziness. She has a complicated heart history of NIDCMP s/p AICD, HTN, aphasia, CKD, preDM. She sees Dr. Wynonia Lawman, has had normal carotid US 2014, last Echo was 2014 showed EF 25%, mild MR, no thrombus. She is on plavix. Her weight is stable, she denies AICD shocks, she sleeps on a wedge pillow. She complains of constant SOB but this is unchanged.  She is on cholesterol medication and denies myalgias. Her cholesterol is at goal. The cholesterol last visit was:   Lab Results  Component Value Date   CHOL 134 12/05/2014   HDL 46 12/05/2014   LDLCALC 13 12/05/2014   TRIG 375* 12/05/2014   CHOLHDL 2.9 12/05/2014   She has been working on diet and exercise for  prediabetes, and denies paresthesia of the feet, polydipsia, polyuria and visual disturbances. Last A1C in the office was:  Lab Results  Component Value Date   HGBA1C 6.0* 12/05/2014  Patient is on Vitamin D supplement.   Lab Results  Component Value Date   VD25OH 7 12/05/2014  States she has a dull right sided ache constantly, worse when she stands on the hard wood it is worse, can bother her at night, states it is vaginally.  She follows with Dr. Radene Knee.    Current Medications:  Current Outpatient Prescriptions on File Prior to Visit  Medication Sig Dispense Refill  . atorvastatin (LIPITOR) 80 MG tablet Take 1 tablet (80 mg total) by mouth daily. 30 tablet 3  . b complex vitamins capsule Take 1 capsule by mouth daily.     . carvedilol (COREG) 6.25 MG tablet Take 6.25 mg by mouth 2 (two) times daily with a meal.    . Cholecalciferol (VITAMIN D3) 1000 UNITS CAPS Take 2,000 Units by mouth daily.     . clopidogrel (PLAVIX) 75 MG tablet TAKE 1 TABLET BY MOUTH EVERY DAY TO PREVENT HEART ATTACK 30 tablet 11  . FREESTYLE LITE test strip TEST GLUCOSE ONCE DAILY AS DIRECTED 100 each 2  . gabapentin (NEURONTIN) 600 MG tablet TAKE 1 TABLET BY MOUTH AT BEDTIME FOR NEUROPATHY 90 tablet 99  . ibuprofen (ADVIL,MOTRIN) 400 MG tablet Take once daily as needed with food for chest pain 30 tablet 0  . losartan (COZAAR) 50  MG tablet Take 25 mg by mouth daily.     . magnesium gluconate (MAGONATE) 500 MG tablet Take 500 mg by mouth daily.     . pantoprazole (PROTONIX) 40 MG tablet Take 1 tablet (40 mg total) by mouth daily. 30 tablet 1  . ranitidine (ZANTAC) 300 MG tablet TAKE 1 TABLET TWICE A DAY FOR REFLUX (Patient taking differently: TAKE 1 TABLET BY MOUTH TWICE A DAY FOR REFLUX) 180 tablet 1   No current facility-administered medications on file prior to visit.   Medical History:  Past Medical History  Diagnosis Date  . Secondary cardiomyopathy, unspecified   . Occlusion and stenosis of carotid artery  without mention of cerebral infarction   . GERD (gastroesophageal reflux disease)   . Diabetes mellitus without complication   . A-fib   . Biventricular cardiac pacemaker in situ     05/06/13  Old AICD explanted and 6949 lead capped  RV lead  Medtronic 5076 [id number]  LV lead  Medtronic 4194  [id number] V Medtronic biventricular pacer  PVX [id number]   . Hyperlipidemia   . Peripheral neuropathy   . History of esophageal stricture   . History of duodenal ulcer   . Transient global amnesia   . Hypertension   . Right groin pain 12/12/2014   Allergies:  Allergies  Allergen Reactions  . Nexium [Esomeprazole Magnesium] Diarrhea     Review of Systems:  Review of Systems  Constitutional: Negative.  Negative for fever and chills.  HENT: Negative.   Eyes: Negative.   Respiratory: Negative.   Cardiovascular: Negative.   Gastrointestinal: Positive for abdominal pain (RLQ). Negative for heartburn, nausea, vomiting, diarrhea, constipation, blood in stool and melena.  Genitourinary: Positive for frequency. Negative for dysuria, urgency, hematuria and flank pain.       + incontinence, + feeling of something coming out with straining  Musculoskeletal: Negative.  Negative for back pain, joint pain and falls.  Skin: Negative.   Neurological: Negative.   Psychiatric/Behavioral: Negative.     Family history- Review and unchanged Social history- Review and unchanged Physical Exam: BP 136/60 mmHg  Pulse 62  Temp(Src) 98.6 F (37 C) (Temporal)  Resp 18  Ht 5\' 3"  (1.6 m)  Wt 152 lb (68.947 kg)  BMI 26.93 kg/m2 Wt Readings from Last 3 Encounters:  03/24/15 152 lb (68.947 kg)  12/21/14 150 lb 12.8 oz (68.402 kg)  12/12/14 152 lb (68.947 kg)   General Appearance: Well nourished, in no apparent distress. Eyes: PERRLA, EOMs, conjunctiva no swelling or erythema Sinuses: No Frontal/maxillary tenderness ENT/Mouth: Ext aud canals clear, TMs without erythema, bulging. No erythema, swelling, or exudate  on post pharynx.  Tonsils not swollen or erythematous. Hearing normal.  Neck: Supple, thyroid normal.  Respiratory: Respiratory effort normal, BS equal bilaterally without rales, rhonchi, wheezing or stridor.  Cardio: RRR with no MRGs. Brisk peripheral pulses without edema.  Abdomen: Soft, + BS, +RLQ tenderness, no guarding, rebound, hernias, masses. Lymphatics: Non tender without lymphadenopathy.  Musculoskeletal: Full ROM back and right hip without pain, 4/5 strength, Normal gait Skin: Warm, dry without rashes, lesions, ecchymosis.  Neuro: Cranial nerves intact. Normal muscle tone, no cerebellar symptoms. Psych: Awake and oriented X 3, normal affect, Insight and Judgment appropriate.    Vicie Mutters, PA-C 9:18 AM [facility]  \

## 2015-03-24 NOTE — Patient Instructions (Signed)

## 2015-03-25 LAB — INSULIN, RANDOM: Insulin: 6.2 u[IU]/mL (ref 2.0–19.6)

## 2015-03-27 ENCOUNTER — Other Ambulatory Visit: Payer: Self-pay | Admitting: *Deleted

## 2015-03-28 ENCOUNTER — Ambulatory Visit (INDEPENDENT_AMBULATORY_CARE_PROVIDER_SITE_OTHER): Payer: Medicare Other | Admitting: Internal Medicine

## 2015-03-28 ENCOUNTER — Encounter: Payer: Self-pay | Admitting: Internal Medicine

## 2015-03-28 VITALS — BP 138/62 | HR 64 | Ht 63.0 in | Wt 150.4 lb

## 2015-03-28 DIAGNOSIS — I10 Essential (primary) hypertension: Secondary | ICD-10-CM

## 2015-03-28 DIAGNOSIS — I4469 Other fascicular block: Secondary | ICD-10-CM | POA: Diagnosis not present

## 2015-03-28 DIAGNOSIS — I5022 Chronic systolic (congestive) heart failure: Secondary | ICD-10-CM | POA: Diagnosis not present

## 2015-03-28 DIAGNOSIS — I4460 Unspecified fascicular block: Secondary | ICD-10-CM

## 2015-03-28 DIAGNOSIS — Z950 Presence of cardiac pacemaker: Secondary | ICD-10-CM | POA: Diagnosis not present

## 2015-03-28 LAB — MDC_IDC_ENUM_SESS_TYPE_INCLINIC
Battery Remaining Longevity: 64 mo
Brady Statistic AP VP Percent: 7.1 %
Brady Statistic AP VS Percent: 0.02 %
Brady Statistic AS VP Percent: 92.3 %
Brady Statistic RA Percent Paced: 7.12 %
Date Time Interrogation Session: 20160419092120
Lead Channel Impedance Value: 323 Ohm
Lead Channel Impedance Value: 399 Ohm
Lead Channel Impedance Value: 437 Ohm
Lead Channel Impedance Value: 437 Ohm
Lead Channel Impedance Value: 494 Ohm
Lead Channel Impedance Value: 513 Ohm
Lead Channel Impedance Value: 589 Ohm
Lead Channel Impedance Value: 608 Ohm
Lead Channel Pacing Threshold Amplitude: 0.5 V
Lead Channel Pacing Threshold Amplitude: 1.25 V
Lead Channel Pacing Threshold Pulse Width: 0.4 ms
Lead Channel Pacing Threshold Pulse Width: 0.4 ms
Lead Channel Sensing Intrinsic Amplitude: 1.125 mV
Lead Channel Setting Pacing Amplitude: 2 V
Lead Channel Setting Pacing Amplitude: 2 V
Lead Channel Setting Pacing Amplitude: 2.25 V
Lead Channel Setting Pacing Pulse Width: 0.4 ms
MDC IDC MSMT BATTERY VOLTAGE: 3.01 V
MDC IDC MSMT LEADCHNL LV IMPEDANCE VALUE: 456 Ohm
MDC IDC MSMT LEADCHNL RA PACING THRESHOLD PULSEWIDTH: 0.4 ms
MDC IDC MSMT LEADCHNL RV PACING THRESHOLD AMPLITUDE: 0.75 V
MDC IDC MSMT LEADCHNL RV SENSING INTR AMPL: 12.75 mV
MDC IDC SET LEADCHNL LV PACING PULSEWIDTH: 0.4 ms
MDC IDC SET LEADCHNL RV SENSING SENSITIVITY: 0.9 mV
MDC IDC SET ZONE DETECTION INTERVAL: 400 ms
MDC IDC SET ZONE DETECTION INTERVAL: 400 ms
MDC IDC STAT BRADY AS VS PERCENT: 0.58 %
MDC IDC STAT BRADY RV PERCENT PACED: 99.41 %

## 2015-03-28 NOTE — Assessment & Plan Note (Signed)
Her symptoms are class II and she feels well. She is encouraged to maintain a low-sodium diet. She will continue her current medical therapy.

## 2015-03-28 NOTE — Patient Instructions (Signed)
Medication Instructions:  Your physician recommends that you continue on your current medications as directed. Please refer to the Current Medication list given to you today.   Labwork: None  Testing/Procedures: None  Follow-Up: Follow up with Dr. Lovena Le as needed.

## 2015-03-28 NOTE — Assessment & Plan Note (Signed)
Her blood pressure is minimally elevated today. I again stressed the importance of a low-sodium diet. I will not change her medications today. She will follow-up with her primary cardiologist.

## 2015-03-28 NOTE — Progress Notes (Signed)
HPI [name] returns today for followup.  She is a very pleasant 81 year old woman with a history of chronic systolic heart failure, status post ICD implantation, who underwent removal of her prior ICD and insertion of a biventricular pacemaker. She returns today for followup. She has experienced some chest pain over her PPM insertion site. She also has arthritic symptoms with bilateral knee pain. She denies shortness of breath. No syncope. No edema. No fever or chills. She has gotten off of her daily diuretic, taking only prn. Allergies  Allergen Reactions  . Nexium [Esomeprazole Magnesium] Diarrhea     Current Outpatient Prescriptions  Medication Sig Dispense Refill  . atorvastatin (LIPITOR) 80 MG tablet Take 1 tablet (80 mg total) by mouth daily. 30 tablet 3  . b complex vitamins capsule Take 1 capsule by mouth daily.     . carvedilol (COREG) 6.25 MG tablet Take 6.25 mg by mouth 2 (two) times daily with a meal.    . Cholecalciferol (VITAMIN D3) 1000 UNITS CAPS Take 2,000 Units by mouth daily.     . clopidogrel (PLAVIX) 75 MG tablet TAKE 1 TABLET BY MOUTH EVERY DAY TO PREVENT HEART ATTACK 30 tablet 11  . FREESTYLE LITE test strip TEST GLUCOSE ONCE DAILY AS DIRECTED 100 each 2  . gabapentin (NEURONTIN) 600 MG tablet TAKE 1 TABLET BY MOUTH AT BEDTIME FOR NEUROPATHY 90 tablet 99  . ibuprofen (ADVIL,MOTRIN) 400 MG tablet Take once daily as needed with food for chest pain (Patient taking differently: Take 1 tablet by mouth once daily as needed with food for chest pain) 30 tablet 0  . losartan (COZAAR) 50 MG tablet Take 25 mg by mouth daily.     . magnesium gluconate (MAGONATE) 500 MG tablet Take 500 mg by mouth daily.     . pantoprazole (PROTONIX) 40 MG tablet Take 80 mg by mouth daily.     . ranitidine (ZANTAC) 300 MG tablet TAKE 1 TABLET TWICE A DAY FOR REFLUX (Patient taking differently: TAKE 1 TABLET BY MOUTH TWICE A DAY FOR REFLUX) 180 tablet 1  . tiZANidine (ZANAFLEX) 4 MG tablet Take 1  tablet (4 mg total) by mouth at bedtime. 30 tablet 0   No current facility-administered medications for this visit.     Past Medical History  Diagnosis Date  . Secondary cardiomyopathy, unspecified   . Occlusion and stenosis of carotid artery without mention of cerebral infarction   . GERD (gastroesophageal reflux disease)   . Diabetes mellitus without complication   . A-fib   . Biventricular cardiac pacemaker in situ     05/06/13  Old AICD explanted and 6949 lead capped  RV lead  Medtronic 5076 [id number]  LV lead  Medtronic 4194  [id number] V Medtronic biventricular pacer  PVX [id number]   . Hyperlipidemia   . Peripheral neuropathy   . History of esophageal stricture   . History of duodenal ulcer   . Transient global amnesia   . Hypertension   . Right groin pain 12/12/2014    ROS:   All systems reviewed and negative except as noted in the HPI.   Past Surgical History  Procedure Laterality Date  . Cardiac defibrillator placement  09/2004    MDT ICD implanted for primary prevention; device upgrade to CRTP in 04-2013  . Cardiac catheterization      04/2003  . Appendectomy    . Abdominal hysterectomy    . Biv pacemaker generator change out  05/05/2013    MDT CRTP upgrade (previously implanted  dual chamber ICD) by Dr Lovena Le.  New RV/LV leads placed.   . Carpal tunnel release    . Cholecystectomy    . Breast surgery Left 1985    Negative  Biopsy  . Bi-ventricular pacemaker upgrade N/A 05/05/2013    Procedure: BI-VENTRICULAR PACEMAKER UPGRADE;  Surgeon: Evans Lance, MD;  Location: [facility] CATH LAB;  Service: Cardiovascular;  Laterality: N/A;     Family History  Problem Relation Age of Onset  . Cancer Maternal Grandmother     breast  . Dementia Mother   . Diabetes Mother   . Heart Problems Father   . Heart attack Son   . Heart disease Son   . Stroke Daughter   . Hypertension Daughter   . Hyperlipidemia Daughter   . Diabetes Daughter   . Diabetes Sister   . Hypertension  Sister   . Hyperlipidemia Sister   . Heart disease Brother   . Heart attack Brother   . Hypertension Sister   . Hyperlipidemia Sister   . Cancer Sister     Breast  . Heart Problems Sister      History   Social History  . Marital Status: Widowed    Spouse Name: N/A  . Number of Children: 2  . Years of Education: 12   Occupational History  . Not on file.   Social History Main Topics  . Smoking status: Passive Smoke Exposure - Never Smoker  . Smokeless tobacco: Never Used     Comment: worked at [employer]  . Alcohol Use: No  . Drug Use: No  . Sexual Activity: Not on file   Other Topics Concern  . Not on file   Social History Narrative   Caffeine Daily average (3 cups am).  Retired.    Widowed.  6 KIDS.  Lives with daughter at her home.     BP 138/62 mmHg  Pulse 64  Ht 5\' 3"  (1.6 m)  Wt 150 lb 6.4 oz (68.221 kg)  BMI 26.65 kg/m2  Physical Exam:  Well appearing elderly woman, NAD HEENT: Unremarkable Neck:  6 cm JVD, no thyromegally Back:  No CVA tenderness Lungs:  Clear with no wheezes, rales, or rhonchi. Well-healed pacemaker incision. Tenderness over her device with no erythema or swelling HEART:  Regular rate rhythm, no murmurs, no rubs, no clicks Abd:  soft, positive bowel sounds, no organomegally, no rebound, no guarding Ext:  2 plus pulses, no edema, no cyanosis, no clubbing Skin:  No rashes no nodules Neuro:  CN II through XII intact, motor grossly intact   DEVICE  Normal device function.  See PaceArt for details.   Assess/Plan:

## 2015-03-28 NOTE — Assessment & Plan Note (Signed)
Her Medtronic biventricular pacemaker has been interrogated and reprogrammed. I will see her back as needed, and I am referring the patient back to be followed exclusively by Dr. Wynonia Lawman.

## 2015-04-06 DIAGNOSIS — Z95810 Presence of automatic (implantable) cardiac defibrillator: Secondary | ICD-10-CM | POA: Diagnosis not present

## 2015-04-06 DIAGNOSIS — R0602 Shortness of breath: Secondary | ICD-10-CM | POA: Diagnosis not present

## 2015-04-06 DIAGNOSIS — I10 Essential (primary) hypertension: Secondary | ICD-10-CM | POA: Diagnosis not present

## 2015-04-06 DIAGNOSIS — I951 Orthostatic hypotension: Secondary | ICD-10-CM | POA: Diagnosis not present

## 2015-04-06 DIAGNOSIS — I6529 Occlusion and stenosis of unspecified carotid artery: Secondary | ICD-10-CM | POA: Diagnosis not present

## 2015-04-06 DIAGNOSIS — I5022 Chronic systolic (congestive) heart failure: Secondary | ICD-10-CM | POA: Diagnosis not present

## 2015-04-06 DIAGNOSIS — K219 Gastro-esophageal reflux disease without esophagitis: Secondary | ICD-10-CM | POA: Diagnosis not present

## 2015-04-06 DIAGNOSIS — Z79899 Other long term (current) drug therapy: Secondary | ICD-10-CM | POA: Diagnosis not present

## 2015-04-06 DIAGNOSIS — I420 Dilated cardiomyopathy: Secondary | ICD-10-CM | POA: Diagnosis not present

## 2015-04-06 DIAGNOSIS — E785 Hyperlipidemia, unspecified: Secondary | ICD-10-CM | POA: Diagnosis not present

## 2015-04-06 DIAGNOSIS — Z8673 Personal history of transient ischemic attack (TIA), and cerebral infarction without residual deficits: Secondary | ICD-10-CM | POA: Diagnosis not present

## 2015-05-03 DIAGNOSIS — Z01419 Encounter for gynecological examination (general) (routine) without abnormal findings: Secondary | ICD-10-CM | POA: Diagnosis not present

## 2015-05-03 DIAGNOSIS — Z6827 Body mass index (BMI) 27.0-27.9, adult: Secondary | ICD-10-CM | POA: Diagnosis not present

## 2015-05-09 DIAGNOSIS — Z1231 Encounter for screening mammogram for malignant neoplasm of breast: Secondary | ICD-10-CM | POA: Diagnosis not present

## 2015-06-15 ENCOUNTER — Other Ambulatory Visit: Payer: Self-pay | Admitting: Internal Medicine

## 2015-06-28 ENCOUNTER — Encounter: Payer: Self-pay | Admitting: Physician Assistant

## 2015-06-28 ENCOUNTER — Ambulatory Visit (INDEPENDENT_AMBULATORY_CARE_PROVIDER_SITE_OTHER): Payer: Medicare Other | Admitting: Internal Medicine

## 2015-06-28 ENCOUNTER — Encounter: Payer: Self-pay | Admitting: Internal Medicine

## 2015-06-28 VITALS — BP 156/68 | HR 72 | Temp 97.5°F | Resp 16 | Ht 63.0 in | Wt 152.4 lb

## 2015-06-28 DIAGNOSIS — Z6826 Body mass index (BMI) 26.0-26.9, adult: Secondary | ICD-10-CM

## 2015-06-28 DIAGNOSIS — I10 Essential (primary) hypertension: Secondary | ICD-10-CM | POA: Diagnosis not present

## 2015-06-28 DIAGNOSIS — N183 Chronic kidney disease, stage 3 unspecified: Secondary | ICD-10-CM

## 2015-06-28 DIAGNOSIS — E785 Hyperlipidemia, unspecified: Secondary | ICD-10-CM

## 2015-06-28 DIAGNOSIS — Z79899 Other long term (current) drug therapy: Secondary | ICD-10-CM | POA: Diagnosis not present

## 2015-06-28 DIAGNOSIS — R7309 Other abnormal glucose: Secondary | ICD-10-CM | POA: Diagnosis not present

## 2015-06-28 DIAGNOSIS — R7303 Prediabetes: Secondary | ICD-10-CM

## 2015-06-28 DIAGNOSIS — E559 Vitamin D deficiency, unspecified: Secondary | ICD-10-CM

## 2015-06-28 LAB — CBC WITH DIFFERENTIAL/PLATELET
BASOS PCT: 1 % (ref 0–1)
Basophils Absolute: 0.1 10*3/uL (ref 0.0–0.1)
EOS ABS: 0.2 10*3/uL (ref 0.0–0.7)
Eosinophils Relative: 3 % (ref 0–5)
HCT: 37.6 % (ref 36.0–46.0)
HEMOGLOBIN: 12.9 g/dL (ref 12.0–15.0)
LYMPHS PCT: 29 % (ref 12–46)
Lymphs Abs: 1.7 10*3/uL (ref 0.7–4.0)
MCH: 31 pg (ref 26.0–34.0)
MCHC: 34.3 g/dL (ref 30.0–36.0)
MCV: 90.4 fL (ref 78.0–100.0)
MONOS PCT: 7 % (ref 3–12)
MPV: 10.9 fL (ref 8.6–12.4)
Monocytes Absolute: 0.4 10*3/uL (ref 0.1–1.0)
NEUTROS ABS: 3.5 10*3/uL (ref 1.7–7.7)
Neutrophils Relative %: 60 % (ref 43–77)
Platelets: 217 10*3/uL (ref 150–400)
RBC: 4.16 MIL/uL (ref 3.87–5.11)
RDW: 13.4 % (ref 11.5–15.5)
WBC: 5.9 10*3/uL (ref 4.0–10.5)

## 2015-06-28 LAB — LIPID PANEL
Cholesterol: 134 mg/dL (ref 0–200)
HDL: 57 mg/dL (ref >=46)
LDL Cholesterol: 58 mg/dL (ref 0–99)
TRIGLYCERIDES: 96 mg/dL (ref <150)
Total CHOL/HDL Ratio: 2.4 Ratio
VLDL: 19 mg/dL (ref 0–40)

## 2015-06-28 LAB — HEMOGLOBIN A1C
Hgb A1c MFr Bld: 6 % — ABNORMAL HIGH (ref <5.7)
MEAN PLASMA GLUCOSE: 126 mg/dL — AB (ref <117)

## 2015-06-28 LAB — MAGNESIUM: MAGNESIUM: 2 mg/dL (ref 1.5–2.5)

## 2015-06-28 LAB — BASIC METABOLIC PANEL WITH GFR
BUN: 16 mg/dL (ref 6–23)
CO2: 25 meq/L (ref 19–32)
Calcium: 9.3 mg/dL (ref 8.4–10.5)
Chloride: 107 mEq/L (ref 96–112)
Creat: 1.21 mg/dL — ABNORMAL HIGH (ref 0.50–1.10)
GFR, Est African American: 46 mL/min — ABNORMAL LOW
GFR, Est Non African American: 40 mL/min — ABNORMAL LOW
GLUCOSE: 105 mg/dL — AB (ref 70–99)
Potassium: 4.6 mEq/L (ref 3.5–5.3)
SODIUM: 144 meq/L (ref 135–145)

## 2015-06-28 LAB — HEPATIC FUNCTION PANEL
ALK PHOS: 48 U/L (ref 39–117)
ALT: 13 U/L (ref 0–35)
AST: 18 U/L (ref 0–37)
Albumin: 3.8 g/dL (ref 3.5–5.2)
Bilirubin, Direct: 0.1 mg/dL (ref 0.0–0.3)
Indirect Bilirubin: 0.6 mg/dL (ref 0.2–1.2)
TOTAL PROTEIN: 6.2 g/dL (ref 6.0–8.3)
Total Bilirubin: 0.7 mg/dL (ref 0.2–1.2)

## 2015-06-28 LAB — TSH: TSH: 2.655 u[IU]/mL (ref 0.350–4.500)

## 2015-06-28 NOTE — Progress Notes (Signed)
Patient ID: [name], female   DOB: [birth year], 81 y.o.   MRN: [id number]   This very nice 81 y.o. WWF presents for 3 month follow up with Hypertension, ASHD, Hyperlipidemia, Pre-Diabetes and Vitamin D Deficiency. Patient is on Gabapentin for hx/o painful neuropathy manifest as pains in her feet. Patient also has hx/o GERD and is apparently controlled on pantoprazole as she denies any sx's of dyspepsia or reflux.    Patient is treated for HTN since 1988  & BP has been controlled at home. Today's BP is sl elevated at 156/68.  In 2004, patient had a Production assistant, radio inserted and changed in 2008 to a defibrillator. In 2010, she had a negative stress Myoview. Patient has had no complaints of any cardiac type chest pain, palpitations, dyspnea/orthopnea/PND, dizziness, claudication, or dependent edema.   Hyperlipidemia is controlled with diet & meds. Patient denies myalgias or other med SE's. Last Lipids were at goal -  Cholesterol 141; HDL 62; LDL  51; Triglycerides 139 on 03/24/2015.   Also, the patient has history of PreDiabetes since 2012 with A1c 5.8% and has had no symptoms of reactive hypoglycemia, diabetic polys, paresthesias or visual blurring.  Last A1c was  6.1% on 03/24/2015.   Further, the patient also has history of Vitamin D Deficiency and supplements vitamin D without any suspected side-effects. Last vitamin D was 48 on 12/05/2014.  Medication Sig  . atorvastatin  80 MG tablet Take 1 tablet (80 mg total) by mouth daily.  Marland Kitchen b complex vitamins capsule Take 1 capsule by mouth daily.   . carvedilol  6.25 MG tablet Take 6.25 mg by mouth 2 (two) times daily with a meal.  . VITAMIN D 1000 UNITS Take 2,000 Units by mouth daily.   . clopidogrel  75 MG tablet TAKE 1 TABLET BY MOUTH EVERY DAY TO PREVENT HEART ATTACK  . gabapentin  600 MG tablet TAKE 1 TABLET BY MOUTH AT BEDTIME FOR NEUROPATHY  . ibuprofen  400 MG tablet  Take 1 tablet by mouth once daily as needed with food for chest pain)  .  losartan 50 MG tablet Take 25 mg by mouth daily.   . magnesium gluconate  500 MG tablet Take 500 mg by mouth daily.   . pantoprazole  40 MG tablet Take 80 mg by mouth daily.   . ranitidine  300 MG tablet TAKE 1 TABLET BY MOUTH TWICE A DAY FOR REFLUX)  . tiZANidine  4 MG tablet Take 1 tablet (4 mg total) by mouth at bedtime.   Allergies  Allergen Reactions  . Nexium [Esomeprazole Magnesium] Diarrhea   PMHx:   Past Medical History  Diagnosis Date  . Secondary cardiomyopathy, unspecified   . Occlusion and stenosis of carotid artery without mention of cerebral infarction   . GERD (gastroesophageal reflux disease)   . Diabetes mellitus without complication   . A-fib   . Biventricular cardiac pacemaker in situ     05/06/13  Old AICD explanted and 6949 lead capped  RV lead  Medtronic 5076 [id number]  LV lead  Medtronic 4194  [id number] V Medtronic biventricular pacer  PVX [id number]   . Hyperlipidemia   . Peripheral neuropathy   . History of esophageal stricture   . History of duodenal ulcer   . Transient global amnesia   . Hypertension   . Right groin pain 12/12/2014   Immunization History  Administered Date(s) Administered  . Influenza, High Dose Seasonal PF 10/19/2013  . Influenza-Unspecified 10/08/2012, 02/03/2015  . Pneumococcal  Conjugate-13 08/31/2014  . Pneumococcal-Unspecified 12/09/2002  . Td 12/09/2002   Past Surgical History  Procedure Laterality Date  . Cardiac defibrillator placement  09/2004    MDT ICD implanted for primary prevention; device upgrade to CRTP in 04-2013  . Cardiac catheterization      04/2003  . Appendectomy    . Abdominal hysterectomy    . Biv pacemaker generator change out  05/05/2013    MDT CRTP upgrade (previously implanted dual chamber ICD) by Dr Lovena Le.  New RV/LV leads placed.   . Carpal tunnel release    . Cholecystectomy    . Breast surgery Left 1985    Negative  Biopsy  . Bi-ventricular pacemaker upgrade N/A 05/05/2013    Procedure:  BI-VENTRICULAR PACEMAKER UPGRADE;  Surgeon: Evans Lance, MD;  Location[facility] C[facility];  Service: Cardiovascular;  Laterality: N/A;   FHx:    Reviewed / unchanged  SHx:    Reviewed / unchanged  Systems Review:  Constitutional: Denies fever, chills, wt changes, headaches, insomnia, fatigue, night sweats, change in appetite. Eyes: Denies redness, blurred vision, diplopia, discharge, itchy, watery eyes.  ENT: Denies discharge, congestion, post nasal drip, epistaxis, sore throat, earache, hearing loss, dental pain, tinnitus, vertigo, sinus pain, snoring.  CV: Denies chest pain, palpitations, irregular heartbeat, syncope, dyspnea, diaphoresis, orthopnea, PND, claudication or edema. Respiratory: denies cough, dyspnea, DOE, pleurisy, hoarseness, laryngitis, wheezing.  Gastrointestinal: Denies dysphagia, odynophagia, heartburn, reflux, water brash, abdominal pain or cramps, nausea, vomiting, bloating, diarrhea, constipation, hematemesis, melena, hematochezia  or hemorrhoids. Genitourinary: Denies dysuria, frequency, urgency, nocturia, hesitancy, discharge, hematuria or flank pain. Musculoskeletal: Denies arthralgias, myalgias, stiffness, jt. swelling, pain, limping or strain/sprain.  Skin: Denies pruritus, rash, hives, warts, acne, eczema or change in skin lesion(s). Neuro.ovn : No weakness, tremor, incoordination, spasms, paresthesia or pain. Psychiatric: Denies confusion, memory loss or sensory loss. Endo: Denies change in weight, skin or hair change.  Heme/Lymph: No excessive bleeding, bruising or enlarged lymph nodes.  Physical Exam  BP 156/68   Pulse 72  Temp 97.5 F   Resp 16  Ht 5\' 3"    Wt 152 lb 6.4 oz     BMI 27.00  Appears well nourished and in no distress. Eyes: PERRLA, EOMs, conjunctiva no swelling or erythema. Sinuses: No frontal/maxillary tenderness ENT/Mouth: EAC's clear, TM's nl w/o erythema, bulging. Nares clear w/o erythema, swelling, exudates. Oropharynx clear without  erythema or exudates. Oral hygiene is good. Tongue normal, non obstructing. Hearing intact.  Neck: Supple. Thyroid nl. Car 2+/2+ without bruits, nodes or JVD. Chest: Respirations nl with BS clear & equal w/o rales, rhonchi, wheezing or stridor.  Cor: Heart sounds normal w/ regular rate and rhythm without sig. murmurs, gallops, clicks, or rubs. Peripheral pulses normal and equal  without edema.  Abdomen: Soft & bowel sounds normal. Non-tender w/o guarding, rebound, hernias, masses, or organomegaly.  Lymphatics: Unremarkable.  Musculoskeletal: Full ROM all peripheral extremities, joint stability, 5/5 strength, and normal gait.  Skin: Warm, dry without exposed rashes, lesions or ecchymosis apparent.  Neuro: Cranial nerves intact, reflexes equal bilaterally. Sensory-motor testing grossly intact. Tendon reflexes grossly intact.  Pysch: Alert & oriented x 3.  Insight and judgement nl & appropriate. No ideations.  Assessment and Plan:  1. Essential hypertension  - TSH  2. Hyperlipidemia  - Lipid panel  3. Prediabetes  - Hemoglobin A1c - Insulin, random  4. Vitamin D deficiency  - Vit D  25 hydroxy   5. CKD, stage III   6. BMI 26.0-26.9,adult  7. Medication management  - CBC with Differential/Platelet - BASIC METABOLIC PANEL WITH GFR - Hepatic function panel - Magnesium   Recommended regular exercise, BP monitoring, weight control, and discussed med and SE's. Recommended labs to assess and monitor clinical status. Further disposition pending results of labs. Over 30 minutes of exam, counseling, chart review was performed

## 2015-06-29 LAB — VITAMIN D 25 HYDROXY (VIT D DEFICIENCY, FRACTURES): VIT D 25 HYDROXY: 53 ng/mL (ref 30–100)

## 2015-06-29 LAB — INSULIN, RANDOM: Insulin: 6.4 u[IU]/mL (ref 2.0–19.6)

## 2015-07-10 DIAGNOSIS — D1801 Hemangioma of skin and subcutaneous tissue: Secondary | ICD-10-CM | POA: Diagnosis not present

## 2015-07-10 DIAGNOSIS — L821 Other seborrheic keratosis: Secondary | ICD-10-CM | POA: Diagnosis not present

## 2015-07-10 DIAGNOSIS — B351 Tinea unguium: Secondary | ICD-10-CM | POA: Diagnosis not present

## 2015-07-24 DIAGNOSIS — N958 Other specified menopausal and perimenopausal disorders: Secondary | ICD-10-CM | POA: Diagnosis not present

## 2015-07-24 DIAGNOSIS — N9489 Other specified conditions associated with female genital organs and menstrual cycle: Secondary | ICD-10-CM | POA: Diagnosis not present

## 2015-07-24 DIAGNOSIS — M8588 Other specified disorders of bone density and structure, other site: Secondary | ICD-10-CM | POA: Diagnosis not present

## 2015-08-03 ENCOUNTER — Encounter: Payer: Self-pay | Admitting: Gastroenterology

## 2015-08-25 ENCOUNTER — Other Ambulatory Visit: Payer: Self-pay | Admitting: Internal Medicine

## 2015-08-29 ENCOUNTER — Encounter: Payer: Self-pay | Admitting: Cardiology

## 2015-08-29 DIAGNOSIS — E785 Hyperlipidemia, unspecified: Secondary | ICD-10-CM | POA: Diagnosis not present

## 2015-08-29 DIAGNOSIS — Z95810 Presence of automatic (implantable) cardiac defibrillator: Secondary | ICD-10-CM | POA: Diagnosis not present

## 2015-08-29 DIAGNOSIS — Z8673 Personal history of transient ischemic attack (TIA), and cerebral infarction without residual deficits: Secondary | ICD-10-CM | POA: Diagnosis not present

## 2015-08-29 DIAGNOSIS — I951 Orthostatic hypotension: Secondary | ICD-10-CM | POA: Diagnosis not present

## 2015-08-29 DIAGNOSIS — R0602 Shortness of breath: Secondary | ICD-10-CM | POA: Diagnosis not present

## 2015-08-29 DIAGNOSIS — I5022 Chronic systolic (congestive) heart failure: Secondary | ICD-10-CM | POA: Diagnosis not present

## 2015-08-29 DIAGNOSIS — I10 Essential (primary) hypertension: Secondary | ICD-10-CM | POA: Diagnosis not present

## 2015-08-29 DIAGNOSIS — K219 Gastro-esophageal reflux disease without esophagitis: Secondary | ICD-10-CM | POA: Diagnosis not present

## 2015-08-29 DIAGNOSIS — Z79899 Other long term (current) drug therapy: Secondary | ICD-10-CM | POA: Diagnosis not present

## 2015-08-29 DIAGNOSIS — I6529 Occlusion and stenosis of unspecified carotid artery: Secondary | ICD-10-CM | POA: Diagnosis not present

## 2015-08-29 DIAGNOSIS — I420 Dilated cardiomyopathy: Secondary | ICD-10-CM | POA: Diagnosis not present

## 2015-08-29 NOTE — Progress Notes (Signed)
Patient ID: [name], female   DOB: 1925/07/05, 81 y.o.   MRN: [id number]   Danielle, [name]  Date of visit:  08/29/2015 DOB:  08-18-1925    Age:  81 yrs. Medical record number:  [id number]     Account number:  [id number] Primary Care Provider: Unk Pinto [name] ____________________________ CURRENT DIAGNOSES  1. Dilated cardiomyopathy  2. Chronic systolic (congestive) heart failure  3. Occlusion and stenosis of unspecified carotid artery  4. Orthostatic hypotension  5. Essential (primary) hypertension  6. Presence of automatic (implantable) cardiac defibrillator  7. Personal history of transient ischemic attack (TIA), and cerebral infarction without residual deficits  8. Hyperlipidemia  9. Gastro-esophageal reflux disease without esophagitis  10. Shortness of breath  11. Other long term (current) drug therapy ____________________________ ALLERGIES  Esomeprazole, Intolerance-unknown  Spironolactone, Intolerance-dizziness ____________________________ MEDICATIONS  1. magnesium 250 mg tablet, 1 p.o. daily  2. Vitamin B-100 Complex tablet, 1 p.o. daily  3. clopidogrel 75 mg tablet, 1 p.o. daily  4. ranitidine 300 mg tablet, 1 p.o. daily  5. gabapentin 600 mg tablet, QHS  6. atorvastatin 80 mg tablet, 1/2 tab m w f  7. Vitamin D3 1,000 unit capsule, 2 p.o. daily  8. carvedilol 6.25 mg tablet, BID  9. losartan 50 mg tablet, 1/2 tab daily  10. Vitamin D3 2,000 unit tablet, 1 p.o. daily ____________________________ CHIEF COMPLAINTS  Followup of Chronic systolic (congestive) heart failure ____________________________ HISTORY OF PRESENT ILLNESS Patient seen for cardiac followup. She has a known nonischemic cardiomyopathy and has a biventricular pacemaker. We have been trying to get her to phone this and have it checked but this has been difficult. Pacemaker was evaluated today in the office. She is able to do her normal housework lives with her daughter. She denies angina and has no PND,  orthopnea or edema. She has not had any recurrent episodes of syncope. She has a history of orthostatic hypotension in the past but this has not really been a problem. ____________________________ PAST HISTORY  Past Medical Illnesses:  hypertension, hyperlipidemia, GERD, history of esophageal stricture, history of duodenal ulcer, transient amnesia, peripheral neuropathy;  Cardiovascular Illnesses:  conduction disorder-LBBB, cardiomyopathy(idiopathic), arrhythmia-PVCs, history of SVT, orthostatic hypotension;  Surgical Procedures:  cholecystectomy, cesarean section, hysterectomy, carpal tunnel release, left ear surgery;  NYHA Classification:  II;  Canadian Angina Classification:  Class 0: Asymptomatic;  Cardiology Procedures-Noninvasive:  adenosine cardiolite April 2004, regadenoson thallium May 2010, echocardiogram 2014;  Cardiac Cath Results:  no significant disease;  LVEF of 25% documented via echocardiogram on 04/16/2101,   ____________________________ CARDIO-PULMONARY TEST DATES EKG Date:  02/13/2015;   Cardiac Cath Date:  04/14/2003;  Nuclear Study Date:  05/04/2009;  Echocardiography Date: 06/22/2013;  Chest Xray Date: 06/21/2013;   ____________________________ FAMILY HISTORY Brother -- Brother dead, Unknown disease Brother -- Brother dead, Myocardial infarction at less than 76 Brother -- Brother dead, CVA Father -- Father dead, Coronary Artery Disease, Deceased Mother -- Mother dead, Coronary Artery Disease, Diabetes mellitus Sister -- Sister alive with problem, Breast disease, Hypertension Sister -- Sister alive and well Son -- Son dead ____________________________ SOCIAL HISTORY Alcohol Use:  no alcohol use;  Smoking:  never smoked;  Diet:  regular diet without modifications;  Lifestyle:  widowed;  Exercise:  gardening;  Occupation:  retired Astronomer;  Residence:  lives with daughter;   ____________________________ REVIEW OF SYSTEMS General:  malaise and fatigue Eyes: wears eye  glasses/contact lenses, cataract extraction Ears, Nose, Throat, Mouth:  partial hearing loss left ear Respiratory:  mild dyspnea with exertion Cardiovascular:  please review HPI Abdominal: occasional heartburn and dysphagia Musculoskeletal:  arthritis of the left arm Neurological:  dizziness, memory deficit, peripheral neuropathy  ____________________________ PHYSICAL EXAMINATION VITAL SIGNS  Blood Pressure:  140/62 Sitting, Left arm, regular cuff  , 144/66 Sitting, Left arm and regular cuff   Pulse:  68/min. Weight:  152.00 lbs. Height:  63"BMI: 27  Constitutional:  pleasant white female, in no acute distress, mildly obese Skin:  warm and dry to touch, no apparent skin lesions, or masses noted. Head:  normocephalic, normal hair pattern, no masses or tenderness ENT:  ears, nose and throat reveal no gross abnormalities.  Dentition good. Neck:  supple, no masses, thyromegaly, JVD. Carotid pulses are full and equal bilaterally without bruits. Chest:  normal symmetry, clear to auscultation, healed ICD incision in the left pectoral area Cardiac:  regular rhythm, normal S1 and S2, no S3 or S4, grade 1/6 systolic murmur Peripheral Pulses:  pulses full and equal in all extremities Extremities & Back:  no edema present Neurological:  no gross motor or sensory deficits noted ____________________________ MOST RECENT LIPID PANEL 05/29/15  CHOL TOTL 134 mg/dl, LDL 58 NM, HDL 57 mg/dl and TRIGLYCER 96 mg/dl ____________________________ IMPRESSIONS/PLAN  1. Nonischemic cardiomyopathy 2. Chronic systolic congestive heart failure without complications 3. Functioning biventricular pacemaker  Recommendations:  She is difficult to assess but no recurrence of syncope and does have mild dyspnea. Doing fairly well at her current age and plan followup in 6 months. ____________________________ TODAYS ORDERS  1. Return Visit: 6 months                       ____________________________ Cardiology Physician:   Danielle Hough MD [facility]

## 2015-09-05 DIAGNOSIS — H5340 Unspecified visual field defects: Secondary | ICD-10-CM | POA: Diagnosis not present

## 2015-09-05 DIAGNOSIS — H35371 Puckering of macula, right eye: Secondary | ICD-10-CM | POA: Diagnosis not present

## 2015-09-05 DIAGNOSIS — H35362 Drusen (degenerative) of macula, left eye: Secondary | ICD-10-CM | POA: Diagnosis not present

## 2015-09-05 DIAGNOSIS — E119 Type 2 diabetes mellitus without complications: Secondary | ICD-10-CM | POA: Diagnosis not present

## 2015-10-03 ENCOUNTER — Ambulatory Visit (INDEPENDENT_AMBULATORY_CARE_PROVIDER_SITE_OTHER): Payer: Medicare Other | Admitting: Physician Assistant

## 2015-10-03 ENCOUNTER — Encounter: Payer: Self-pay | Admitting: Physician Assistant

## 2015-10-03 VITALS — BP 130/70 | HR 75 | Temp 97.7°F | Resp 14 | Ht 63.0 in | Wt 151.0 lb

## 2015-10-03 DIAGNOSIS — Z23 Encounter for immunization: Secondary | ICD-10-CM | POA: Diagnosis not present

## 2015-10-03 DIAGNOSIS — Z0001 Encounter for general adult medical examination with abnormal findings: Secondary | ICD-10-CM | POA: Diagnosis not present

## 2015-10-03 DIAGNOSIS — E559 Vitamin D deficiency, unspecified: Secondary | ICD-10-CM

## 2015-10-03 DIAGNOSIS — I420 Dilated cardiomyopathy: Secondary | ICD-10-CM

## 2015-10-03 DIAGNOSIS — I10 Essential (primary) hypertension: Secondary | ICD-10-CM | POA: Diagnosis not present

## 2015-10-03 DIAGNOSIS — Z0000 Encounter for general adult medical examination without abnormal findings: Secondary | ICD-10-CM

## 2015-10-03 DIAGNOSIS — R7309 Other abnormal glucose: Secondary | ICD-10-CM

## 2015-10-03 DIAGNOSIS — H6123 Impacted cerumen, bilateral: Secondary | ICD-10-CM | POA: Diagnosis not present

## 2015-10-03 DIAGNOSIS — E785 Hyperlipidemia, unspecified: Secondary | ICD-10-CM

## 2015-10-03 DIAGNOSIS — N183 Chronic kidney disease, stage 3 unspecified: Secondary | ICD-10-CM

## 2015-10-03 DIAGNOSIS — R6889 Other general symptoms and signs: Secondary | ICD-10-CM | POA: Diagnosis not present

## 2015-10-03 DIAGNOSIS — I2510 Atherosclerotic heart disease of native coronary artery without angina pectoris: Secondary | ICD-10-CM

## 2015-10-03 DIAGNOSIS — R350 Frequency of micturition: Secondary | ICD-10-CM | POA: Diagnosis not present

## 2015-10-03 DIAGNOSIS — Z79899 Other long term (current) drug therapy: Secondary | ICD-10-CM

## 2015-10-03 DIAGNOSIS — I5022 Chronic systolic (congestive) heart failure: Secondary | ICD-10-CM

## 2015-10-03 NOTE — Patient Instructions (Signed)
Use a dropper or use a cap to put olive oil,mineral oil or canola oil in the effected ear- 2-3 times a week. Let it soak for 20-30 min then you can take a shower or use a baby bulb with warm water to wash out the ear wax.  Do not use Qtips  Preventive Care for Adults A healthy lifestyle and preventive care can promote health and wellness. Preventive health guidelines for women include the following key practices.  A routine yearly physical is a good way to check with your health care provider about your health and preventive screening. It is a chance to share any concerns and updates on your health and to receive a thorough exam.  Visit your dentist for a routine exam and preventive care every 6 months. Brush your teeth twice a day and floss once a day. Good oral hygiene prevents tooth decay and gum disease.  The frequency of eye exams is based on your age, health, family medical history, use of contact lenses, and other factors. Follow your health care provider's recommendations for frequency of eye exams.  Eat a healthy diet. Foods like vegetables, fruits, whole grains, low-fat dairy products, and lean protein foods contain the nutrients you need without too many calories. Decrease your intake of foods high in solid fats, added sugars, and salt. Eat the right amount of calories for you.Get information about a proper diet from your health care provider, if necessary.  Regular physical exercise is one of the most important things you can do for your health. Most adults should get at least 150 minutes of moderate-intensity exercise (any activity that increases your heart rate and causes you to sweat) each week. In addition, most adults need muscle-strengthening exercises on 2 or more days a week.  Maintain a healthy weight. The body mass index (BMI) is a screening tool to identify possible weight problems. It provides an estimate of body fat based on height and weight. Your health care provider can  find your BMI and can help you achieve or maintain a healthy weight.For adults 20 years and older:  A BMI below 18.5 is considered underweight.  A BMI of 18.5 to 24.9 is normal.  A BMI of 25 to 29.9 is considered overweight.  A BMI of 30 and above is considered obese.  Maintain normal blood lipids and cholesterol levels by exercising and minimizing your intake of saturated fat. Eat a balanced diet with plenty of fruit and vegetables. If your lipid or cholesterol levels are high, you are over 50, or you are at high risk for heart disease, you may need your cholesterol levels checked more frequently.Ongoing high lipid and cholesterol levels should be treated with medicines if diet and exercise are not working.  If you smoke, find out from your health care provider how to quit. If you do not use tobacco, do not start.  Lung cancer screening is recommended for adults aged 59-80 years who are at high risk for developing lung cancer because of a history of smoking. A yearly low-dose CT scan of the lungs is recommended for people who have at least a 30-pack-year history of smoking and are a current smoker or have quit within the past 15 years. A pack year of smoking is smoking an average of 1 pack of cigarettes a day for 1 year (for example: 1 pack a day for 30 years or 2 packs a day for 15 years). Yearly screening should continue until the smoker has stopped smoking for  at least 15 years. Yearly screening should be stopped for people who develop a health problem that would prevent them from having lung cancer treatment.  Avoid use of street drugs. Do not share needles with anyone. Ask for help if you need support or instructions about stopping the use of drugs.  High blood pressure causes heart disease and increases the risk of stroke.  Ongoing high blood pressure should be treated with medicines if weight loss and exercise do not work.  If you are 23-40 years old, ask your health care provider if  you should take aspirin to prevent strokes.  Diabetes screening involves taking a blood sample to check your fasting blood sugar level. This should be done once every 3 years, after age 75, if you are within normal weight and without risk factors for diabetes. Testing should be considered at a younger age or be carried out more frequently if you are overweight and have at least 1 risk factor for diabetes.  Breast cancer screening is essential preventive care for women. You should practice "breast self-awareness." This means understanding the normal appearance and feel of your breasts and may include breast self-examination. Any changes detected, no matter how small, should be reported to a health care provider. Women in their 12s and 30s should have a clinical breast exam (CBE) by a health care provider as part of a regular health exam every 1 to 3 years. After age 55, women should have a CBE every year. Starting at age 2, women should consider having a mammogram (breast X-ray test) every year. Women who have a family history of breast cancer should talk to their health care provider about genetic screening. Women at a high risk of breast cancer should talk to their health care providers about having an MRI and a mammogram every year.  Breast cancer gene (BRCA)-related cancer risk assessment is recommended for women who have family members with BRCA-related cancers. BRCA-related cancers include breast, ovarian, tubal, and peritoneal cancers. Having family members with these cancers may be associated with an increased risk for harmful changes (mutations) in the breast cancer genes BRCA1 and BRCA2. Results of the assessment will determine the need for genetic counseling and BRCA1 and BRCA2 testing.  Routine pelvic exams to screen for cancer are no longer recommended for nonpregnant women who are considered low risk for cancer of the pelvic organs (ovaries, uterus, and vagina) and who do not have symptoms. Ask  your health care provider if a screening pelvic exam is right for you.  If you have had past treatment for cervical cancer or a condition that could lead to cancer, you need Pap tests and screening for cancer for at least 20 years after your treatment. If Pap tests have been discontinued, your risk factors (such as having a new sexual partner) need to be reassessed to determine if screening should be resumed. Some women have medical problems that increase the chance of getting cervical cancer. In these cases, your health care provider may recommend more frequent screening and Pap tests.    Colorectal cancer can be detected and often prevented. Most routine colorectal cancer screening begins at the age of 80 years and continues through age 9 years. However, your health care provider may recommend screening at an earlier age if you have risk factors for colon cancer. On a yearly basis, your health care provider may provide home test kits to check for hidden blood in the stool. Use of a small camera at the end  of a tube, to directly examine the colon (sigmoidoscopy or colonoscopy), can detect the earliest forms of colorectal cancer. Talk to your health care provider about this at age 30, when routine screening begins. Direct exam of the colon should be repeated every 5-10 years through age 52 years, unless early forms of pre-cancerous polyps or small growths are found.  Osteoporosis is a disease in which the bones lose minerals and strength with aging. This can result in serious bone fractures or breaks. The risk of osteoporosis can be identified using a bone density scan. Women ages 74 years and over and women at risk for fractures or osteoporosis should discuss screening with their health care providers. Ask your health care provider whether you should take a calcium supplement or vitamin D to reduce the rate of osteoporosis.  Menopause can be associated with physical symptoms and risks. Hormone  replacement therapy is available to decrease symptoms and risks. You should talk to your health care provider about whether hormone replacement therapy is right for you.  Use sunscreen. Apply sunscreen liberally and repeatedly throughout the day. You should seek shade when your shadow is shorter than you. Protect yourself by wearing long sleeves, pants, a wide-brimmed hat, and sunglasses year round, whenever you are outdoors.  Once a month, do a whole body skin exam, using a mirror to look at the skin on your back. Tell your health care provider of new moles, moles that have irregular borders, moles that are larger than a pencil eraser, or moles that have changed in shape or color.  Stay current with required vaccines (immunizations).  Influenza vaccine. All adults should be immunized every year.  Tetanus, diphtheria, and acellular pertussis (Td, Tdap) vaccine. Pregnant women should receive 1 dose of Tdap vaccine during each pregnancy. The dose should be obtained regardless of the length of time since the last dose. Immunization is preferred during the 27th-36th week of gestation. An adult who has not previously received Tdap or who does not know her vaccine status should receive 1 dose of Tdap. This initial dose should be followed by tetanus and diphtheria toxoids (Td) booster doses every 10 years. Adults with an unknown or incomplete history of completing a 3-dose immunization series with Td-containing vaccines should begin or complete a primary immunization series including a Tdap dose. Adults should receive a Td booster every 10 years.    Zoster vaccine. One dose is recommended for adults aged 66 years or older unless certain conditions are present.    Pneumococcal 13-valent conjugate (PCV13) vaccine. When indicated, a person who is uncertain of her immunization history and has no record of immunization should receive the PCV13 vaccine. An adult aged 57 years or older who has certain medical  conditions and has not been previously immunized should receive 1 dose of PCV13 vaccine. This PCV13 should be followed with a dose of pneumococcal polysaccharide (PPSV23) vaccine. The PPSV23 vaccine dose should be obtained at least 8 weeks after the dose of PCV13 vaccine. An adult aged 31 years or older who has certain medical conditions and previously received 1 or more doses of PPSV23 vaccine should receive 1 dose of PCV13. The PCV13 vaccine dose should be obtained 1 or more years after the last PPSV23 vaccine dose.    Pneumococcal polysaccharide (PPSV23) vaccine. When PCV13 is also indicated, PCV13 should be obtained first. All adults aged 11 years and older should be immunized. An adult younger than age 43 years who has certain medical conditions should be immunized.  Any person who resides in a nursing home or long-term care facility should be immunized. An adult smoker should be immunized. People with an immunocompromised condition and certain other conditions should receive both PCV13 and PPSV23 vaccines. People with human immunodeficiency virus (HIV) infection should be immunized as soon as possible after diagnosis. Immunization during chemotherapy or radiation therapy should be avoided. Routine use of PPSV23 vaccine is not recommended for American Indians, [place], or people younger than 65 years unless there are medical conditions that require PPSV23 vaccine. When indicated, people who have unknown immunization and have no record of immunization should receive PPSV23 vaccine. One-time revaccination 5 years after the first dose of PPSV23 is recommended for people aged 19-64 years who have chronic kidney failure, nephrotic syndrome, asplenia, or immunocompromised conditions. People who received 1-2 doses of PPSV23 before age 38 years should receive another dose of PPSV23 vaccine at age 68 years or later if at least 5 years have passed since the previous dose. Doses of PPSV23 are not needed for  people immunized with PPSV23 at or after age 42 years.   Preventive Services / Frequency  Ages 18 years and over  Blood pressure check.  Lipid and cholesterol check.  Lung cancer screening. / Every year if you are aged 79-80 years and have a 30-pack-year history of smoking and currently smoke or have quit within the past 15 years. Yearly screening is stopped once you have quit smoking for at least 15 years or develop a health problem that would prevent you from having lung cancer treatment.  Clinical breast exam.** / Every year after age 6 years.  BRCA-related cancer risk assessment.** / For women who have family members with a BRCA-related cancer (breast, ovarian, tubal, or peritoneal cancers).  Mammogram.** / Every year beginning at age 49 years and continuing for as long as you are in good health. Consult with your health care provider.  Pap test.** / Every 3 years starting at age 84 years through age 63 or 27 years with 3 consecutive normal Pap tests. Testing can be stopped between 65 and 70 years with 3 consecutive normal Pap tests and no abnormal Pap or HPV tests in the past 10 years.  Fecal occult blood test (FOBT) of stool. / Every year beginning at age 11 years and continuing until age 66 years. You may not need to do this test if you get a colonoscopy every 10 years.  Flexible sigmoidoscopy or colonoscopy.** / Every 5 years for a flexible sigmoidoscopy or every 10 years for a colonoscopy beginning at age 78 years and continuing until age 16 years.  Hepatitis C blood test.** / For all people born from [birth year] through 1963 and any individual with known risks for hepatitis C.  Osteoporosis screening.** / A one-time screening for women ages 9 years and over and women at risk for fractures or osteoporosis.  Skin self-exam. / Monthly.  Influenza vaccine. / Every year.  Tetanus, diphtheria, and acellular pertussis (Tdap/Td) vaccine.** / 1 dose of Td every 10 years.  Zoster  vaccine.** / 1 dose for adults aged 46 years or older.  Pneumococcal 13-valent conjugate (PCV13) vaccine.** / Consult your health care provider.  Pneumococcal polysaccharide (PPSV23) vaccine.** / 1 dose for all adults aged 74 years and older. Screening for abdominal aortic aneurysm (AAA)  by ultrasound is recommended for people who have history of high blood pressure or who are current or former smokers.

## 2015-10-03 NOTE — Progress Notes (Signed)
MEDICARE ANNUAL WELLNESS VISIT AND FOLLOW UP  Assessment:   1. Essential hypertension - continue medications, DASH diet, exercise and monitor at home. Call if greater than 130/80.  - CBC with Differential/Platelet - BASIC METABOLIC PANEL WITH GFR - Hepatic function panel - TSH  2. Chronic systolic heart failure (HCC) CHF- If any increasing shortness of breath, swelling, or chest pressure go to ER immediately. Decrease your sodium intake to less than 2000 mg daily, decrease your fluid intake to less than 2 L daily, elevate legs, weigh yourself daily, call the office if 5 lbs weight loss OR gain in a day, and please remember to always increase your potassium intake with any increase of your fluid pill.   3. Dilated cardiomyopathy, nonischemic Continue cardio follow up  4. ASHD (arteriosclerotic heart disease) Control blood pressure, cholesterol, glucose, increase exercise.   5. CKD (chronic kidney disease), stage III  avoid NSAIDS, monitor sugars, will monitor  6. Abnormal glucose Discussed general issues about diabetes pathophysiology and management., Educational material distributed., Suggested low cholesterol diet., Encouraged aerobic exercise., Discussed foot care., Reminded to get yearly retinal exam. - Hemoglobin A1c - HM DIABETES FOOT EXAM  7. Hyperlipidemia -continue medications, check lipids, decrease fatty foods, increase activity.  - Lipid panel  8. Encounter for Medicare annual wellness exam  9. Vitamin D deficiency Continue supplement  10. Medication management - Magnesium  11. Cerumen impaction, bilateral Removal in the office, improved right ear hearing  12. Urinary frequency - Urinalysis, Routine w reflex microscopic (not at [facility]) - Urine culture  13. Need for prophylactic vaccination with combined diphtheria-tetanus-pertussis (DTP) vaccine - Dt vaccine greater than 7yo IM     Plan:   During the course of the visit the patient was educated and  counseled about appropriate screening and preventive services including:    Pneumococcal vaccine   Influenza vaccine  Td vaccine  Screening electrocardiogram  Screening mammography  Bone densitometry screening  Colorectal cancer screening  Diabetes screening  Glaucoma screening  Nutrition counseling   Advanced directives: given info/requested  Conditions/risks identified: BMI: Discussed weight loss, diet, and increase physical activity.  Increase physical activity: AHA recommends 150 minutes of physical activity a week.  Medications reviewed DEXA- ordered Diabetes is at goal, ACE/ARB therapy: Yes. Urinary Incontinence is an issue: discussed non pharmacology and pharmacology options.  Fall risk: moderate- discussed PT, home fall assessment, medications.    Subjective:   [name] is a 81 y.o. female who presents for Medicare Annual Wellness Visit and 3 month follow up on hypertension, prediabetes, hyperlipidemia, vitamin D def.  Date of last medicare wellness visit was 08/2014  Her blood pressure has been controlled at home, today their BP is BP: 130/70 mmHg She does not workout. She denies chest pain,  dizziness.  She has a complicated heart history of NIDCMP s/p AICD. She sees Dr. Wynonia Lawman, has had normal carotid US 2014, last Echo was 2014 showed EF 25%, mild MR, no thrombus. She is on plavix. Her weight is stable, she went from AICD to biventricular pacemaker in april she sleeps on a wedge pillow. She complains of constant SOB but this is unchanged. She has had a negative Myoview 2010.  She complains of chronic fatigue on and off for the past year. She is on coreg, losartan, and only takes bumex as needed, very rarely.  Wt Readings from Last 3 Encounters:  10/03/15 151 lb (68.493 kg)  06/28/15 152 lb 6.4 oz (69.128 kg)  03/28/15 150 lb  6.4 oz (68.221 kg)   She is on cholesterol medication and denies myalgias. Her cholesterol is at goal. The cholesterol last  visit was:   Lab Results  Component Value Date   CHOL 134 06/28/2015   HDL 57 06/28/2015   LDLCALC 58 06/28/2015   TRIG 96 06/28/2015   CHOLHDL 2.4 06/28/2015   She has been working on diet and exercise for prediabetes, and denies polydipsia and polyuria. She is on gabapentin for painful neuropathy, no falls. Last A1C in the office was:  Lab Results  Component Value Date   HGBA1C 6.0* 06/28/2015   Patient is on Vitamin D supplement. Lab Results  Component Value Date   VD25OH 53 06/28/2015      Names of Other Physician/Practitioners you currently use: 1. [facility]- here for primary care 2. Dr. Gwynn Burly II, eye doctor, last visit 08/2015 3. Dr. Andree Elk dentist, last visit 3 months ago Patient Care Team: Unk Pinto, MD as PCP - General (Internal Medicine) Unk Pinto, MD (Internal Medicine) Jacolyn Reedy, MD as Consulting Physician (Cardiology) Wallene Huh, DPM as Consulting Physician (Podiatry) Larey Seat, MD as Consulting Physician (Neurology) G. Eston Mould II (Ophthalmology) Magnus Ivan. Andree Elk, DDS  Medication Review Current Outpatient Prescriptions on File Prior to Visit  Medication Sig Dispense Refill  . atorvastatin (LIPITOR) 80 MG tablet TAKE 1 TABLET EVERY DAY 30 tablet 3  . b complex vitamins capsule Take 1 capsule by mouth daily.     . bumetanide (BUMEX) 1 MG tablet Take 0.5 mg by mouth daily.  12  . carvedilol (COREG) 6.25 MG tablet Take 6.25 mg by mouth 2 (two) times daily with a meal.    . Cholecalciferol (VITAMIN D3) 1000 UNITS CAPS Take 2,000 Units by mouth daily.     . clopidogrel (PLAVIX) 75 MG tablet TAKE 1 TABLET BY MOUTH EVERY DAY TO PREVENT HEART ATTACK 30 tablet 3  . FREESTYLE LITE test strip TEST GLUCOSE ONCE DAILY AS DIRECTED 100 each 2  . gabapentin (NEURONTIN) 600 MG tablet TAKE 1 TABLET BY MOUTH AT BEDTIME FOR NEUROPATHY 90 tablet 99  . losartan (COZAAR) 50 MG tablet Take 25 mg by mouth  daily.     . magnesium gluconate (MAGONATE) 500 MG tablet Take 500 mg by mouth daily.      No current facility-administered medications on file prior to visit.    Current Problems (verified) Patient Active Problem List   Diagnosis Date Noted  . Encounter for Medicare annual wellness exam 10/03/2015  . ASHD (arteriosclerotic heart disease) 12/05/2014  . GERD (gastroesophageal reflux disease) 12/05/2014  . Prediabetes 05/21/2014  . Vitamin D deficiency 05/20/2014  . Medication management 05/20/2014  . CKD (chronic kidney disease), stage III 06/21/2013  . History of vasodepressor syncope   . Biventricular cardiac pacemaker in situ   . Hyperlipidemia   . Hypertension   . LBBB   . Chronic systolic heart failure ([place])   . Dilated cardiomyopathy, nonischemic     Screening Tests Health Maintenance  Topic Date Due  . OPHTHALMOLOGY EXAM  09/24/1937  . ZOSTAVAX  09/25/1987  . DEXA SCAN  09/24/1992  . TETANUS/TDAP  12/09/2012  . INFLUENZA VACCINE  07/10/2015  . FOOT EXAM  09/01/2015  . URINE MICROALBUMIN  12/06/2015  . HEMOGLOBIN A1C  12/29/2015  . PNA vac Low Risk Adult  Completed     Immunization History  Administered Date(s) Administered  . Influenza, High Dose Seasonal PF 10/19/2013  . Influenza-Unspecified  10/08/2012, 02/03/2015  . Pneumococcal Conjugate-13 08/31/2014  . Pneumococcal-Unspecified 12/09/2002  . Td 12/09/2002    Preventative care: Last colonoscopy: 2012 Last mammogram: Dr. Marvel Plan- 2014  Last pap smear/pelvic exam: remote, declines DEXA:long time ago CT head 06/2013 CXR 05/2014 Echo 06/2013 Carotid US 06/2013  Prior vaccinations: TD or Tdap: 2004 NEEDS Influenza: 2016 Pneumococcal: 2004 Prevnar 13: 2015 Shingles/Zostavax: declines  Allergies Allergies  Allergen Reactions  . Nexium [Esomeprazole Magnesium] Diarrhea   Surgical history Past Surgical History  Procedure Laterality Date  . Cardiac defibrillator placement  09/2004    MDT  ICD implanted for primary prevention; device upgrade to CRTP in 04-2013  . Cardiac catheterization      04/2003  . Appendectomy    . Abdominal hysterectomy    . Biv pacemaker generator change out  05/05/2013    MDT CRTP upgrade (previously implanted dual chamber ICD) by Dr Lovena Le.  New RV/LV leads placed.   . Carpal tunnel release    . Cholecystectomy    . Breast surgery Left 1985    Negative  Biopsy  . Bi-ventricular pacemaker upgrade N/A 05/05/2013    Procedure: BI-VENTRICULAR PACEMAKER UPGRADE;  Surgeon: Evans Lance, MD;  Location: [facility] CATH LAB;  Service: Cardiovascular;  Laterality: N/A;   Family history Family History  Problem Relation Age of Onset  . Cancer Maternal Grandmother     breast  . Dementia Mother   . Diabetes Mother   . Heart Problems Father   . Heart attack Son   . Heart disease Son   . Stroke Daughter   . Hypertension Daughter   . Hyperlipidemia Daughter   . Diabetes Daughter   . Diabetes Sister   . Hypertension Sister   . Hyperlipidemia Sister   . Heart disease Brother   . Heart attack Brother   . Hypertension Sister   . Hyperlipidemia Sister   . Cancer Sister     Breast  . Heart Problems Sister     Risk Factors: Osteoporosis: postmenopausal estrogen deficiency and dietary calcium and/or vitamin D deficiency History of fracture in the past year: no  Tobacco Social History  Substance Use Topics  . Smoking status: Passive Smoke Exposure - Never Smoker  . Smokeless tobacco: Never Used     Comment: worked at [employer]  . Alcohol Use: No   MEDICARE WELLNESS OBJECTIVES: Tobacco use: She does not smoke.  Patient is not a former smoker. If yes, counseling given Alcohol Current alcohol use: none Osteoporosis: postmenopausal estrogen deficiency and dietary calcium and/or vitamin D deficiency, History of fracture in the past year: no Fall risk: Moderate Risk Hearing: impaired Visual acuity: impaired,  does perform annual eye exam Diet: in general, a  "healthy" diet   Physical activity: Current Exercise Habits:: The patient does not participate in regular exercise at present;Exercise is limited by, Limited by:: cardiac condition(s) Cardiac risk factors: Cardiac Risk Factors include: advanced age (>29men, >94 women);dyslipidemia;family history of premature cardiovascular disease;hypertension;sedentary lifestyle Depression/mood screen:   Depression screen [facility] 2/9 10/03/2015  Decreased Interest 0  Down, Depressed, Hopeless 0  PHQ - 2 Score 0    ADLs:  In your present state of health, do you have any difficulty performing the following activities: 10/03/2015 12/05/2014  Hearing? Y N  Vision? Y N  Difficulty concentrating or making decisions? N N  Walking or climbing stairs? Y N  Dressing or bathing? N N  Doing errands, shopping? N N  Preparing Food and eating ? N -  Using  the Toilet? N -  In the past six months, have you accidently leaked urine? Y -  Do you have problems with loss of bowel control? N -  Managing your Medications? N -  Managing your Finances? N -  Housekeeping or managing your Housekeeping? N -     Cognitive Testing  Alert? Yes  Normal Appearance?Yes  Oriented to person? Yes  Place? Yes   Time? Yes  Recall of three objects?  2/3  Can perform simple calculations? Yes  Displays appropriate judgment?Yes  Can read the correct time from a watch face?Yes  EOL planning: Does patient have an advance directive?: Yes Type of Advance Directive: [place], Living will Does patient want to make changes to advanced directive?: No - Patient declined Copy of advanced directive(s) in chart?: No - copy requested   Objective:   Blood pressure 130/70, pulse 75, temperature 97.7 F (36.5 C), temperature source Temporal, resp. rate 14, height 5\' 3"  (1.6 m), weight 151 lb (68.493 kg), SpO2 95 %. Body mass index is 26.76 kg/(m^2).  General appearance: alert, no distress, WD/WN,  female HEENT: normocephalic,  sclerae anicteric, bilateral with cerumen impaction but cleaned out in the office, TMs pearly, nares patent, no discharge or erythema, pharynx normal Oral cavity: MMM, no lesions Neck: supple, no lymphadenopathy, no thyromegaly, no masses Heart: RRR, normal S1, S2, no murmurs, pacemaker left upper chest Lungs: CTA bilaterally, no wheezes, rhonchi, or rales Abdomen: +bs, soft, epigastric tenderness, non distended, no masses, no hepatomegaly, no splenomegaly Musculoskeletal: nontender, no swelling, no obvious deformity Extremities: no edema, no cyanosis, no clubbing Pulses: 2+ symmetric, upper and lower extremities, normal cap refill Neurological: alert, oriented x 3, CN2-12 intact, strength normal upper extremities and lower extremities, sensation normal throughout, DTRs 2+ throughout, no cerebellar signs, gait normal Psychiatric: normal affect, behavior normal, pleasant  Breast: defer Gyn: defer Rectal: defer  Medicare Attestation I have personally reviewed: The patient's medical and social history Their use of alcohol, tobacco or illicit drugs Their current medications and supplements The patient's functional ability including ADLs,fall risks, home safety risks, cognitive, and hearing and visual impairment Diet and physical activities Evidence for depression or mood disorders  The patient's weight, height, BMI, and visual acuity have been recorded in the chart.  I have made referrals, counseling, and provided education to the patient based on review of the above and I have provided the patient with a written personalized care plan for preventive services.     Vicie Mutters, PA-C   10/03/2015

## 2015-11-08 ENCOUNTER — Other Ambulatory Visit: Payer: Self-pay | Admitting: Internal Medicine

## 2015-12-07 ENCOUNTER — Encounter: Payer: Self-pay | Admitting: Internal Medicine

## 2015-12-20 DIAGNOSIS — K219 Gastro-esophageal reflux disease without esophagitis: Secondary | ICD-10-CM | POA: Diagnosis not present

## 2015-12-20 DIAGNOSIS — Z950 Presence of cardiac pacemaker: Secondary | ICD-10-CM | POA: Diagnosis not present

## 2015-12-20 DIAGNOSIS — I6529 Occlusion and stenosis of unspecified carotid artery: Secondary | ICD-10-CM | POA: Diagnosis not present

## 2015-12-20 DIAGNOSIS — E785 Hyperlipidemia, unspecified: Secondary | ICD-10-CM | POA: Diagnosis not present

## 2015-12-20 DIAGNOSIS — I951 Orthostatic hypotension: Secondary | ICD-10-CM | POA: Diagnosis not present

## 2015-12-20 DIAGNOSIS — R0602 Shortness of breath: Secondary | ICD-10-CM | POA: Diagnosis not present

## 2015-12-20 DIAGNOSIS — Z8673 Personal history of transient ischemic attack (TIA), and cerebral infarction without residual deficits: Secondary | ICD-10-CM | POA: Diagnosis not present

## 2015-12-20 DIAGNOSIS — I420 Dilated cardiomyopathy: Secondary | ICD-10-CM | POA: Diagnosis not present

## 2015-12-20 DIAGNOSIS — Z95810 Presence of automatic (implantable) cardiac defibrillator: Secondary | ICD-10-CM | POA: Diagnosis not present

## 2015-12-20 DIAGNOSIS — I5022 Chronic systolic (congestive) heart failure: Secondary | ICD-10-CM | POA: Diagnosis not present

## 2015-12-20 DIAGNOSIS — I10 Essential (primary) hypertension: Secondary | ICD-10-CM | POA: Diagnosis not present

## 2015-12-20 DIAGNOSIS — Z79899 Other long term (current) drug therapy: Secondary | ICD-10-CM | POA: Diagnosis not present

## 2016-01-09 ENCOUNTER — Encounter: Payer: Self-pay | Admitting: Internal Medicine

## 2016-01-10 ENCOUNTER — Encounter: Payer: Self-pay | Admitting: Internal Medicine

## 2016-01-11 ENCOUNTER — Other Ambulatory Visit: Payer: Self-pay | Admitting: Internal Medicine

## 2016-01-30 ENCOUNTER — Ambulatory Visit (INDEPENDENT_AMBULATORY_CARE_PROVIDER_SITE_OTHER): Payer: Medicare Other | Admitting: Internal Medicine

## 2016-01-30 ENCOUNTER — Encounter: Payer: Self-pay | Admitting: Internal Medicine

## 2016-01-30 VITALS — BP 160/76 | HR 72 | Temp 97.0°F | Resp 16 | Ht 63.0 in | Wt 150.2 lb

## 2016-01-30 DIAGNOSIS — N183 Chronic kidney disease, stage 3 unspecified: Secondary | ICD-10-CM

## 2016-01-30 DIAGNOSIS — E785 Hyperlipidemia, unspecified: Secondary | ICD-10-CM | POA: Diagnosis not present

## 2016-01-30 DIAGNOSIS — I420 Dilated cardiomyopathy: Secondary | ICD-10-CM

## 2016-01-30 DIAGNOSIS — I429 Cardiomyopathy, unspecified: Secondary | ICD-10-CM | POA: Diagnosis not present

## 2016-01-30 DIAGNOSIS — Z950 Presence of cardiac pacemaker: Secondary | ICD-10-CM

## 2016-01-30 DIAGNOSIS — K219 Gastro-esophageal reflux disease without esophagitis: Secondary | ICD-10-CM | POA: Diagnosis not present

## 2016-01-30 DIAGNOSIS — R7303 Prediabetes: Secondary | ICD-10-CM

## 2016-01-30 DIAGNOSIS — R7309 Other abnormal glucose: Secondary | ICD-10-CM

## 2016-01-30 DIAGNOSIS — Z1389 Encounter for screening for other disorder: Secondary | ICD-10-CM | POA: Diagnosis not present

## 2016-01-30 DIAGNOSIS — E559 Vitamin D deficiency, unspecified: Secondary | ICD-10-CM | POA: Diagnosis not present

## 2016-01-30 DIAGNOSIS — I2510 Atherosclerotic heart disease of native coronary artery without angina pectoris: Secondary | ICD-10-CM

## 2016-01-30 DIAGNOSIS — I10 Essential (primary) hypertension: Secondary | ICD-10-CM | POA: Diagnosis not present

## 2016-01-30 DIAGNOSIS — Z79899 Other long term (current) drug therapy: Secondary | ICD-10-CM | POA: Diagnosis not present

## 2016-01-30 DIAGNOSIS — Z1212 Encounter for screening for malignant neoplasm of rectum: Secondary | ICD-10-CM

## 2016-01-30 DIAGNOSIS — Z1331 Encounter for screening for depression: Secondary | ICD-10-CM

## 2016-01-30 NOTE — Patient Instructions (Signed)

## 2016-01-30 NOTE — Progress Notes (Signed)
Patient ID: [name], female   DOB: 1925/07/22, 82 y.o.   MRN: [id number]  Annual Screening/Preventative Visit And Comprehensive Evaluation &  Examination  This very nice 82 y.o. WWF presents for a Wellness/Preventative Visit & comprehensive evaluation and management of multiple medical co-morbidities.  Patient has been followed for HTN, ASHD/CHF/PPM, Prediabetes, Hyperlipidemia and Vitamin D Deficiency.    HTN predates since 21. Patient's BP has been controlled at home and patient denies any cardiac symptoms as chest pain, palpitations, shortness of breath, dizziness or ankle swelling. Patient had a Production assistant, radio inserted in 2004 and changed in 2008 to a defibrillator. She had a negative stress Myoview in 2010.  Today's BP is 160/76 , rechecked at 144/72.  Patien's cardiologist is Dr Wynonia Lawman and she's followed by Dr Reinaldo Raddle for her AICD.   Patient's hyperlipidemia is controlled with diet and medications. Patient denies myalgias or other medication SE's. Last lipids were at goal with  Cholesterol 134; HDL 57; LDL 58; Triglycerides 96 on 06/28/2015.   Patient has prediabetes predating since 2012 with A1c 6.0% and patient denies reactive hypoglycemic symptoms, visual blurring, diabetic polys or paresthesias. Last A1c was 6.0% on 06/28/2015.   Finally, patient has history of Vitamin D Deficiency of "33" in 2008 and last Vitamin D was 53 on 06/28/2015.   Medication Sig  . atorvastatin  80 MG  TAKE 1 TABLET EVERY DAY  . b complex vitamins  Take 1 capsule by mouth daily.   . bumetanide  1 MG  Take 0.5 mg by mouth daily.  . carvedilol  6.25 MG Take 6.25 mg by mouth 2 (two) times daily with a meal.  . VITAMIN D 1000 UNITS  Take 2,000 Units by mouth daily.   . clopidogrel  75 MG  TAKE 1 TABLET BY MOUTH EVERY DAY TO PREVENT HEART ATTACK  . gabapentin  600 MG  TAKE 1 TABLET BY MOUTH AT BEDTIME FOR NEUROPATHY  . losartan (50 MG  Take 25 mg by mouth daily.   . magnesium  500 MG Take 500 mg by mouth daily.    . ranitidine 300 MG TAKE 1 TABLET TWICE A DAY FOR REFLUX   Allergies  Allergen Reactions  . Nexium [Esomeprazole Magnesium] Diarrhea   Past Medical History  Diagnosis Date  . Secondary cardiomyopathy, unspecified   . Occlusion and stenosis of carotid artery without mention of cerebral infarction   . GERD (gastroesophageal reflux disease)   . Diabetes mellitus without complication ([place])   . A-fib ([place])   . Biventricular cardiac pacemaker in situ     05/06/13  Old AICD explanted and 6949 lead capped  RV lead  Medtronic [id number]  LV lead  Medtronic [id number]  [id number] V Medtronic biventricular pacer  PVX [id number]   . Hyperlipidemia   . Peripheral neuropathy ([place])   . History of esophageal stricture   . History of duodenal ulcer   . Transient global amnesia   . Hypertension   . Right groin pain 12/12/2014   Health Maintenance  Topic Date Due  . HEMOGLOBIN A1C  12/29/2015  . DEXA SCAN  05/22/2017 (Originally 09/24/1992)  . ZOSTAVAX  09/26/2017 (Originally 09/25/1987)  . INFLUENZA VACCINE  07/09/2016  . OPHTHALMOLOGY EXAM  08/21/2016  . FOOT EXAM  10/02/2016  . TETANUS/TDAP  10/02/2025  . PNA vac Low Risk Adult  Completed   Immunization History  Administered Date(s) Administered  . DT 10/03/2015  . Influenza, High Dose Seasonal PF 10/19/2013  . Influenza-Unspecified 10/08/2012, 02/03/2015  .  Pneumococcal Conjugate-13 08/31/2014  . Pneumococcal-Unspecified 12/09/2002  . Td 12/09/2002   Past Surgical History  Procedure Laterality Date  . Cardiac defibrillator placement  09/2004    MDT ICD implanted for primary prevention; device upgrade to CRTP in 04-2013  . Cardiac catheterization      04/2003  . Appendectomy    . Abdominal hysterectomy    . Biv pacemaker generator change out  05/05/2013    MDT CRTP upgrade (previously implanted dual chamber ICD) by Dr Lovena Le.  New RV/LV leads placed.   . Carpal tunnel release    . Cholecystectomy    . Breast surgery Left 1985     Negative  Biopsy  . Bi-ventricular pacemaker upgrade N/A 05/05/2013    Procedure: BI-VENTRICULAR PACEMAKER UPGRADE;  Surgeon: Evans Lance, MD;  Location: [facility] CATH LAB;  Service: Cardiovascular;  Laterality: N/A;   Family History  Problem Relation Age of Onset  . Cancer Maternal Grandmother     breast  . Dementia Mother   . Diabetes Mother   . Heart Problems Father   . Heart attack Son   . Heart disease Son   . Stroke Daughter   . Hypertension Daughter   . Hyperlipidemia Daughter   . Diabetes Daughter   . Diabetes Sister   . Hypertension Sister   . Hyperlipidemia Sister   . Heart disease Brother   . Heart attack Brother   . Hypertension Sister   . Hyperlipidemia Sister   . Cancer Sister     Breast  . Heart Problems Sister    Social History  Substance Use Topics  . Smoking status: Passive Smoke Exposure - Never Smoker  . Smokeless tobacco: Never Used     Comment: worked at [employer]  . Alcohol Use: No    ROS Constitutional: Denies fever, chills, weight loss/gain, headaches, insomnia,  night sweats, and change in appetite. Does c/o fatigue. Eyes: Denies redness, blurred vision, diplopia, discharge, itchy, watery eyes. Dr Gwynn Burly is her Eye Dr.  ENT: Denies discharge, congestion, post nasal drip, epistaxis, sore throat, earache, hearing loss, dental pain, Tinnitus, Vertigo, Sinus pain, snoring. Dentist is Dr Andree Elk. Cardio: Denies chest pain, palpitations, irregular heartbeat, syncope, dyspnea, diaphoresis, orthopnea, PND, claudication, edema Respiratory: denies cough, dyspnea, DOE, pleurisy, hoarseness, laryngitis, wheezing.  Gastrointestinal: Denies dysphagia, heartburn, reflux, water brash, pain, cramps, nausea, vomiting, bloating, diarrhea, constipation, hematemesis, melena, hematochezia, jaundice, hemorrhoids Genitourinary: Denies dysuria, frequency, urgency, nocturia, hesitancy, discharge, hematuria, flank pain Breast: Breast lumps, nipple discharge, bleeding.   Musculoskeletal: Denies arthralgia, myalgia, stiffness, Jt. Swelling, pain, limp, and strain/sprain. Denies falls. Skin: Denies puritis, rash, hives, warts, acne, eczema, changing in skin lesion Neuro: No weakness, tremor, incoordination, spasms, paresthesia, pain Psychiatric: Denies confusion, memory loss, sensory loss. Denies Depression. Endocrine: Denies change in weight, skin, hair change, nocturia, and paresthesia, diabetic polys, visual blurring, hyper / hypo glycemic episodes.  Heme/Lymph: No excessive bleeding, bruising, enlarged lymph nodes.  Physical Exam  BP 160/76 - reck'd at 144/72   Pulse 72  Temp 97 F   Resp 16  Ht 5\' 3"  Wt 150 lb 3.2 oz   BMI 26.61   General Appearance: Well nourished and in no apparent distress.  Eyes: PERRLA, EOMs, conjunctiva no swelling or erythema, normal fundi and vessels. Sinuses: No frontal/maxillary tenderness ENT/Mouth: EACs patent / TMs  nl. Nares clear without erythema, swelling, mucoid exudates. Oral hygiene is good. No erythema, swelling, or exudate. Tongue normal, non-obstructing. Tonsils not swollen or erythematous. Hearing normal.  Neck: Supple,  thyroid normal. No bruits, nodes or JVD. Respiratory: Respiratory effort normal.  BS equal and clear bilateral without rales, rhonci, wheezing or stridor. Cardio: Heart sounds are normal with regular rate and rhythm and no murmurs, rubs or gallops. Peripheral pulses are normal and equal bilaterally without edema. No aortic or femoral bruits. Chest: symmetric with normal excursions and percussion. Breasts: Symmetric, without lumps, nipple discharge, retractions, or fibrocystic changes.  Abdomen: Flat, soft, with bowl sounds. Nontender, no guarding, rebound, hernias, masses, or organomegaly.  Lymphatics: Non tender without lymphadenopathy.  Genitourinary:  Musculoskeletal: Full ROM all peripheral extremities, joint stability, 5/5 strength, and normal gait. Skin: Warm and dry without rashes,  lesions, cyanosis, clubbing or  ecchymosis.  Neuro: Cranial nerves intact, reflexes equal bilaterally. Normal muscle tone, no cerebellar symptoms. Sensation intact.  Pysch: Alert and oriented X 3, normal affect, Insight and Judgment appropriate.   Assessment and Plan  1. Essential hypertension  - Microalbumin / creatinine urine ratio - EKG 12-Lead - [place], RETROPERITNL ABD,  LTD - TSH  2. Hyperlipidemia  - Lipid panel - TSH  3. Prediabetes  - Hemoglobin A1c - Insulin, random  4. Vitamin D deficiency  - VITAMIN D 25 Hydroxy   5. ASHD (arteriosclerotic heart disease)   6. Dilated cardiomyopathy, nonischemic   7. Gastroesophageal reflux disease   8. Biventricular cardiac pacemaker in situ   9. CKD (chronic kidney disease), stage III   10. Screening for rectal cancer  - POC Hemoccult Bld/Stl   11. Depression screen   12. Medication management  - Urinalysis, Routine w reflex microscopic  - CBC with Differential/Platelet - BASIC METABOLIC PANEL WITH GFR - Hepatic function panel - Magnesium  13. Other abnormal glucose  - Hemoglobin A1c - Insulin, random   Continue prudent diet as discussed, weight control, BP monitoring, regular exercise, and medications. Discussed med's effects and SE's. Screening labs and tests as requested with regular follow-up as recommended. Over 40 minutes of exam, counseling, chart review and high complex critical decision making was performed.

## 2016-01-31 DIAGNOSIS — I10 Essential (primary) hypertension: Secondary | ICD-10-CM | POA: Diagnosis not present

## 2016-01-31 DIAGNOSIS — E559 Vitamin D deficiency, unspecified: Secondary | ICD-10-CM | POA: Diagnosis not present

## 2016-01-31 DIAGNOSIS — R7303 Prediabetes: Secondary | ICD-10-CM | POA: Diagnosis not present

## 2016-01-31 DIAGNOSIS — Z79899 Other long term (current) drug therapy: Secondary | ICD-10-CM | POA: Diagnosis not present

## 2016-01-31 DIAGNOSIS — R7309 Other abnormal glucose: Secondary | ICD-10-CM | POA: Diagnosis not present

## 2016-01-31 DIAGNOSIS — E785 Hyperlipidemia, unspecified: Secondary | ICD-10-CM | POA: Diagnosis not present

## 2016-01-31 LAB — CBC WITH DIFFERENTIAL/PLATELET
BASOS ABS: 0 10*3/uL (ref 0.0–0.1)
Basophils Relative: 0 % (ref 0–1)
EOS ABS: 0.2 10*3/uL (ref 0.0–0.7)
EOS PCT: 3 % (ref 0–5)
HEMATOCRIT: 38.4 % (ref 36.0–46.0)
Hemoglobin: 12.7 g/dL (ref 12.0–15.0)
LYMPHS ABS: 1.8 10*3/uL (ref 0.7–4.0)
LYMPHS PCT: 26 % (ref 12–46)
MCH: 30.1 pg (ref 26.0–34.0)
MCHC: 33.1 g/dL (ref 30.0–36.0)
MCV: 91 fL (ref 78.0–100.0)
MONOS PCT: 7 % (ref 3–12)
MPV: 11.1 fL (ref 8.6–12.4)
Monocytes Absolute: 0.5 10*3/uL (ref 0.1–1.0)
Neutro Abs: 4.4 10*3/uL (ref 1.7–7.7)
Neutrophils Relative %: 64 % (ref 43–77)
PLATELETS: 227 10*3/uL (ref 150–400)
RBC: 4.22 MIL/uL (ref 3.87–5.11)
RDW: 13.3 % (ref 11.5–15.5)
WBC: 6.8 10*3/uL (ref 4.0–10.5)

## 2016-02-01 LAB — URINALYSIS, ROUTINE W REFLEX MICROSCOPIC
Bilirubin Urine: NEGATIVE
GLUCOSE, UA: NEGATIVE
HGB URINE DIPSTICK: NEGATIVE
KETONES UR: NEGATIVE
Leukocytes, UA: NEGATIVE
Nitrite: NEGATIVE
PH: 5.5 (ref 5.0–8.0)
Protein, ur: NEGATIVE
SPECIFIC GRAVITY, URINE: 1.022 (ref 1.001–1.035)

## 2016-02-01 LAB — INSULIN, RANDOM: Insulin: 7.2 u[IU]/mL (ref 2.0–19.6)

## 2016-02-01 LAB — HEPATIC FUNCTION PANEL
ALK PHOS: 51 U/L (ref 33–130)
ALT: 15 U/L (ref 6–29)
AST: 20 U/L (ref 10–35)
Albumin: 4 g/dL (ref 3.6–5.1)
BILIRUBIN INDIRECT: 0.5 mg/dL (ref 0.2–1.2)
BILIRUBIN TOTAL: 0.6 mg/dL (ref 0.2–1.2)
Bilirubin, Direct: 0.1 mg/dL (ref <=0.2)
Total Protein: 6.1 g/dL (ref 6.1–8.1)

## 2016-02-01 LAB — BASIC METABOLIC PANEL WITH GFR
BUN: 14 mg/dL (ref 7–25)
CHLORIDE: 107 mmol/L (ref 98–110)
CO2: 28 mmol/L (ref 20–31)
Calcium: 9 mg/dL (ref 8.6–10.4)
Creat: 1.25 mg/dL — ABNORMAL HIGH (ref 0.60–0.88)
GFR, EST AFRICAN AMERICAN: 44 mL/min — AB (ref >=60)
GFR, EST NON AFRICAN AMERICAN: 38 mL/min — AB (ref >=60)
Glucose, Bld: 116 mg/dL — ABNORMAL HIGH (ref 65–99)
POTASSIUM: 4.5 mmol/L (ref 3.5–5.3)
Sodium: 145 mmol/L (ref 135–146)

## 2016-02-01 LAB — MICROALBUMIN / CREATININE URINE RATIO
Creatinine, Urine: 213 mg/dL (ref 20–320)
Microalb Creat Ratio: 6 mcg/mg creat (ref <30)
Microalb, Ur: 1.2 mg/dL

## 2016-02-01 LAB — LIPID PANEL
CHOL/HDL RATIO: 2.1 ratio (ref <=5.0)
Cholesterol: 143 mg/dL (ref 125–200)
HDL: 67 mg/dL (ref >=46)
LDL CALC: 60 mg/dL (ref <130)
TRIGLYCERIDES: 82 mg/dL (ref <150)
VLDL: 16 mg/dL (ref <30)

## 2016-02-01 LAB — HEMOGLOBIN A1C
Hgb A1c MFr Bld: 6 % — ABNORMAL HIGH (ref <5.7)
Mean Plasma Glucose: 126 mg/dL — ABNORMAL HIGH (ref <117)

## 2016-02-01 LAB — VITAMIN D 25 HYDROXY (VIT D DEFICIENCY, FRACTURES): Vit D, 25-Hydroxy: 46 ng/mL (ref 30–100)

## 2016-02-01 LAB — MAGNESIUM: Magnesium: 2 mg/dL (ref 1.5–2.5)

## 2016-02-01 LAB — TSH: TSH: 2.67 mIU/L

## 2016-02-09 ENCOUNTER — Other Ambulatory Visit: Payer: Self-pay | Admitting: Internal Medicine

## 2016-02-14 ENCOUNTER — Encounter: Payer: Self-pay | Admitting: Physician Assistant

## 2016-02-14 ENCOUNTER — Ambulatory Visit (INDEPENDENT_AMBULATORY_CARE_PROVIDER_SITE_OTHER): Payer: Medicare Other | Admitting: Physician Assistant

## 2016-02-14 VITALS — BP 140/68 | HR 79 | Temp 98.1°F | Resp 14 | Ht 63.0 in | Wt 151.6 lb

## 2016-02-14 DIAGNOSIS — B351 Tinea unguium: Secondary | ICD-10-CM | POA: Diagnosis not present

## 2016-02-14 DIAGNOSIS — L600 Ingrowing nail: Secondary | ICD-10-CM

## 2016-02-14 DIAGNOSIS — I2510 Atherosclerotic heart disease of native coronary artery without angina pectoris: Secondary | ICD-10-CM

## 2016-02-14 MED ORDER — CEPHALEXIN 250 MG PO CAPS: 250.0000 mg | ORAL_CAPSULE | Freq: Three times a day (TID) | ORAL | Status: AC

## 2016-02-14 NOTE — Progress Notes (Signed)
   Subjective:    Patient ID: [name], female    DOB: 04-04-1925, 82 y.o.   MRN: [id number]  HPI 82 y.o. WF with history of HTN, preDM, chol, CKD, CHF presents with right great toe pain x 1-2 years. States for past 1-2 week has been worse pain, has large brittle toenail, no warmth, redness but + tender to palpation along medial toe. No numbness/tingling in that foot. Denies fever, chills, streaking.   Blood pressure 140/68, pulse 79, temperature 98.1 F (36.7 C), temperature source Temporal, resp. rate 14, height 5\' 3"  (1.6 m), weight 151 lb 9.6 oz (68.765 kg), SpO2 97 %.   Review of Systems  Constitutional: Negative.   Respiratory: Negative.   Cardiovascular: Negative.   Musculoskeletal: Positive for myalgias and gait problem. Negative for back pain, joint swelling, arthralgias, neck pain and neck stiffness.  Skin: Positive for wound. Negative for color change, pallor and rash.       Objective:   Physical Exam  Constitutional: She is oriented to person, place, and time. She appears well-developed and well-nourished. No distress.  Musculoskeletal: She exhibits no edema.  Neurological: She is alert and oriented to person, place, and time. She displays normal reflexes.  Skin: Skin is warm and dry.  Right great toenail thick, dystrophic and curling into toe with medial toenail with tenderness to palpation, erythema, but no discharge or fluctuance. Good pulses and normal monofilament test. Minor fungal infection on left great toenail.        Assessment & Plan:  Right great toe pain Fungal infection of right great toenail with possible infection Will give ABX Patient prefers to see podiatrist for toenail removal, will refer

## 2016-02-14 NOTE — Patient Instructions (Signed)
Ingrown Toenail  An ingrown toenail occurs when the corner or sides of your toenail grow into the surrounding skin. The big toe is most commonly affected, but it can happen to any of your toes. If your ingrown toenail is not treated, you will be at risk for infection.  CAUSES  This condition may be caused by:  · Wearing shoes that are too small or tight.  · Injury or trauma, such as stubbing your toe or having your toe stepped on.  · Improper cutting or care of your toenails.  · Being born with (congenital) nail or foot abnormalities, such as having a nail that is too big for your toe.  RISK FACTORS  Risk factors for an ingrown toenail include:  · Age. Your nails tend to thicken as you get older, so ingrown nails are more common in older people.  · Diabetes.  · Cutting your toenails incorrectly.  · Blood circulation problems.  SYMPTOMS  Symptoms may include:  · Pain, soreness, or tenderness.  · Redness.  · Swelling.  · Hardening of the skin surrounding the toe.  Your ingrown toenail may be infected if there is fluid, pus, or drainage.  DIAGNOSIS   An ingrown toenail may be diagnosed by medical history and physical exam. If your toenail is infected, your health care provider may test a sample of the drainage.  TREATMENT  Treatment depends on the severity of your ingrown toenail. Some ingrown toenails may be treated at home. More severe or infected ingrown toenails may require surgery to remove all or part of the nail. Infected ingrown toenails may also be treated with antibiotic medicines.  HOME CARE INSTRUCTIONS  · If you were prescribed an antibiotic medicine, finish all of it even if you start to feel better.  · Soak your foot in warm soapy water for 20 minutes, 3 times per day or as directed by your health care provider.  · Carefully lift the edge of the nail away from the sore skin by wedging a small piece of cotton under the corner of the nail. This may help with the pain.  Be careful not to cause more injury  to the area.  · Wear shoes that fit well. If your ingrown toenail is causing you pain, try wearing sandals, if possible.  · Trim your toenails regularly and carefully. Do not cut them in a curved shape. Cut your toenails straight across. This prevents injury to the skin at the corners of the toenail.  · Keep your feet clean and dry.  · If you are having trouble walking and are given crutches by your health care provider, use them as directed.  · Do not pick at your toenail or try to remove it yourself.  · Take medicines only as directed by your health care provider.  · Keep all follow-up visits as directed by your health care provider. This is important.  SEEK MEDICAL CARE IF:  · Your symptoms do not improve with treatment.  SEEK IMMEDIATE MEDICAL CARE IF:  · You have red streaks that start at your foot and go up your leg.  · You have a fever.  · You have increased redness, swelling, or pain.  · You have fluid, blood, or pus coming from your toenail.     This information is not intended to replace advice given to you by your health care provider. Make sure you discuss any questions you have with your health care provider.     Document Released:   11/22/2000 Document Revised: 04/11/2015 Document Reviewed: 10/19/2014  Elsevier Interactive Patient Education ©2016 Elsevier Inc.

## 2016-02-21 DIAGNOSIS — H35371 Puckering of macula, right eye: Secondary | ICD-10-CM | POA: Diagnosis not present

## 2016-02-21 DIAGNOSIS — E119 Type 2 diabetes mellitus without complications: Secondary | ICD-10-CM | POA: Diagnosis not present

## 2016-02-21 DIAGNOSIS — H5340 Unspecified visual field defects: Secondary | ICD-10-CM | POA: Diagnosis not present

## 2016-02-27 ENCOUNTER — Encounter: Payer: Self-pay | Admitting: Cardiology

## 2016-02-27 ENCOUNTER — Telehealth: Payer: Self-pay | Admitting: *Deleted

## 2016-02-27 DIAGNOSIS — I5022 Chronic systolic (congestive) heart failure: Secondary | ICD-10-CM | POA: Diagnosis not present

## 2016-02-27 DIAGNOSIS — I951 Orthostatic hypotension: Secondary | ICD-10-CM | POA: Diagnosis not present

## 2016-02-27 DIAGNOSIS — E785 Hyperlipidemia, unspecified: Secondary | ICD-10-CM | POA: Diagnosis not present

## 2016-02-27 DIAGNOSIS — Z8673 Personal history of transient ischemic attack (TIA), and cerebral infarction without residual deficits: Secondary | ICD-10-CM | POA: Diagnosis not present

## 2016-02-27 DIAGNOSIS — I6529 Occlusion and stenosis of unspecified carotid artery: Secondary | ICD-10-CM | POA: Diagnosis not present

## 2016-02-27 DIAGNOSIS — K219 Gastro-esophageal reflux disease without esophagitis: Secondary | ICD-10-CM | POA: Diagnosis not present

## 2016-02-27 DIAGNOSIS — Z79899 Other long term (current) drug therapy: Secondary | ICD-10-CM | POA: Diagnosis not present

## 2016-02-27 DIAGNOSIS — Z950 Presence of cardiac pacemaker: Secondary | ICD-10-CM | POA: Diagnosis not present

## 2016-02-27 DIAGNOSIS — Z95810 Presence of automatic (implantable) cardiac defibrillator: Secondary | ICD-10-CM | POA: Diagnosis not present

## 2016-02-27 DIAGNOSIS — R0602 Shortness of breath: Secondary | ICD-10-CM | POA: Diagnosis not present

## 2016-02-27 DIAGNOSIS — I10 Essential (primary) hypertension: Secondary | ICD-10-CM | POA: Diagnosis not present

## 2016-02-27 DIAGNOSIS — I420 Dilated cardiomyopathy: Secondary | ICD-10-CM | POA: Diagnosis not present

## 2016-02-27 NOTE — Telephone Encounter (Signed)
"  What do I need to do prior to surgery?"  I'm returning your call.  You called wanting to know about what to do prior to surgery.  What type of surgery are you having?  "I'm coming in to have my toenail taken off."  You don't need to do anything prior to surgery.  You can bring an open toe shoe or sandal if you like.  "Do you think he'll cut my other big toenail while I'm not their?"  I can't guarantee that he will but there may be a possibility.

## 2016-02-27 NOTE — Progress Notes (Signed)
Patient ID: [name], female   DOB: [birth year], 82 y.o.   MRN: [id number]  [name], [name]  Date of visit:  02/27/2016 DOB:  05-28-1925    Age:  82 yrs. Medical record number:  [id number]     Account number:  [id number] Primary Care Provider: Unk [name] D ____________________________ CURRENT DIAGNOSES  1. Dilated cardiomyopathy  2. Chronic systolic (congestive) heart failure  3. Occlusion and stenosis of unspecified carotid artery  4. Orthostatic hypotension  5. Essential (primary) hypertension  6. Personal history of transient ischemic attack (TIA), and cerebral infarction without residual deficits  7. Presence of cardiac pacemaker  8. Hyperlipidemia  9. Gastro-esophageal reflux disease without esophagitis ____________________________ ALLERGIES  Esomeprazole, Intolerance-unknown  Spironolactone, Intolerance-dizziness ____________________________ MEDICATIONS  1. magnesium 250 mg tablet, 1 p.o. daily  2. Vitamin B-100 Complex tablet, 1 p.o. daily  3. clopidogrel 75 mg tablet, 1 p.o. daily  4. ranitidine 300 mg tablet, 1 p.o. daily  5. gabapentin 600 mg tablet, QHS  6. atorvastatin 80 mg tablet, 1/2 tab m w f  7. Vitamin D3 1,000 unit capsule, 2 p.o. daily  8. carvedilol 6.25 mg tablet, BID  9. losartan 50 mg tablet, 1/2 tab daily  10. Vitamin D3 2,000 unit tablet, 1 p.o. daily ____________________________ CHIEF COMPLAINTS  Followup of Dilated cardiomyopathy  Followup of Essential (primary) hypertension ____________________________ HISTORY OF PRESENT ILLNESS Patient seen in cardiac followup. She has had a good 6 months since she was here. She is still able to walk some and notes occasional dizziness. She has mild memory loss. She denies angina and has no PND, orthopnea, syncope, palpitations, or claudication. She does not have any significant edema. Pacemaker function is shown in good results. Recent lab work reviewed. ____________________________ PAST HISTORY  Past  Medical Illnesses:  hypertension, hyperlipidemia, GERD, history of esophageal stricture, history of duodenal ulcer, transient amnesia, peripheral neuropathy;  Cardiovascular Illnesses:  conduction disorder-LBBB, cardiomyopathy(idiopathic), arrhythmia-PVCs, history of SVT, orthostatic hypotension;  Surgical Procedures:  cholecystectomy, cesarean section, hysterectomy, carpal tunnel release, left ear surgery;  NYHA Classification:  II;  Canadian Angina Classification:  Class 0: Asymptomatic;  Cardiology Procedures-Noninvasive:  adenosine cardiolite April 2004, regadenoson thallium May 2010, echocardiogram 2014;  Cardiac Cath Results:  no significant disease;  LVEF of 25% documented via echocardiogram on 04/16/2101,   ____________________________ CARDIO-PULMONARY TEST DATES EKG Date:  02/13/2015;   Cardiac Cath Date:  04/14/2003;  Nuclear Study Date:  05/04/2009;  Echocardiography Date: 06/22/2013;  Chest Xray Date: 06/21/2013;   ____________________________ FAMILY HISTORY Brother -- Brother dead, Unknown disease Brother -- Brother dead, Myocardial infarction at less than 60 Brother -- Brother dead, CVA Father -- Father dead, Coronary Artery Disease, Deceased Mother -- Mother dead, Coronary Artery Disease, Diabetes mellitus Sister -- Sister alive with problem, Breast disease, Hypertension Sister -- Sister alive and well Son -- Son dead ____________________________ SOCIAL HISTORY Alcohol Use:  no alcohol use;  Smoking:  never smoked;  Diet:  regular diet without modifications;  Lifestyle:  widowed;  Exercise:  gardening;  Occupation:  retired Astronomer;  Residence:  lives with daughter;   ____________________________ REVIEW OF SYSTEMS General:  malaise and fatigue Eyes: wears eye glasses/contact lenses, cataract extraction Ears, Nose, Throat, Mouth:  partial hearing loss left ear Respiratory: mild dyspnea with exertion Cardiovascular:  please review HPI Abdominal: occasional heartburn Musculoskeletal:   arthritis of the fingers Neurological:  memory deficit, peripheral neuropathy  ____________________________ PHYSICAL EXAMINATION VITAL SIGNS  Blood Pressure:  144/70 Sitting, Right arm, regular cuff  ,  140/76 Standing, Right arm and regular cuff   Pulse:  78/min. Weight:  150.00 lbs. Height:  63"BMI: 26  Constitutional:  pleasant white female, in no acute distress, mildly obese Skin:  warm and dry to touch, no apparent skin lesions, or masses noted. Head:  normocephalic, normal hair pattern, no masses or tenderness ENT:  ears, nose and throat reveal no gross abnormalities.  Dentition good. Neck:  supple, no masses, thyromegaly, JVD. Carotid pulses are full and equal bilaterally without bruits. Chest:  normal symmetry, clear to auscultation, healed ICD incision in the left pectoral area Cardiac:  regular rhythm, normal S1 and S2, no S3 or S4, grade 1/6 systolic murmur Peripheral Pulses:  pulses full and equal in all extremities Extremities & Back:  no edema present Neurological:  no gross motor or sensory deficits noted ____________________________ MOST RECENT LIPID PANEL 05/29/15  CHOL TOTL 134 mg/dl, LDL 58 NM, HDL 57 mg/dl and TRIGLYCER 96 mg/dl ____________________________ IMPRESSIONS/PLAN  1. Dilated cardiomyopathy 2. Functioning biventricular pacemaker 3. Hyperlipidemia under treatment 4. Chronic systolic heart failure compensated  Recommendations:  She has no recurrence of syncope and is reasonably functional at 82 years old. Medicines reviewed with the patient. Continue followup with remote pacemaker followup followup in 6 months with EKG. Call if problems.  ____________________________ TODAYS ORDERS  1. Return Visit: 80 months  2. 12 Lead EKG: 6 months                       ____________________________ Cardiology Physician:  Kerry Hough MD [facility]

## 2016-03-11 ENCOUNTER — Encounter: Payer: Self-pay | Admitting: Podiatry

## 2016-03-11 ENCOUNTER — Ambulatory Visit: Payer: Medicare Other | Admitting: Podiatry

## 2016-03-11 ENCOUNTER — Ambulatory Visit (INDEPENDENT_AMBULATORY_CARE_PROVIDER_SITE_OTHER): Payer: Medicare Other | Admitting: Podiatry

## 2016-03-11 VITALS — BP 138/65 | HR 60 | Resp 14 | Wt 150.0 lb

## 2016-03-11 DIAGNOSIS — M79676 Pain in unspecified toe(s): Secondary | ICD-10-CM

## 2016-03-11 DIAGNOSIS — B351 Tinea unguium: Secondary | ICD-10-CM | POA: Diagnosis not present

## 2016-03-11 NOTE — Progress Notes (Signed)
   Subjective:    Patient ID: [name], female    DOB: [birth year], 82 y.o.   MRN: [id number]  HPI 82 year old female presents the office today for concerns of thick, painful, elongated tendon of that she cannot trim herself particular the right hallux toenail. She says the nails are painful protective pressure in shoe gear. Denies any surrounding redness or drainage. No other complaints.  Blood sugar runs between 109-139.   Review of Systems  Skin:       Nail changes   All other systems reviewed and are negative.      Objective:   Physical Exam  General: AAO x3, NAD  Dermatological: Nails are hypertrophic, dystrophic, brittle, discolored, elongated 10. No surrounding erythema or drainage. Tenderness nails 1-5 bilaterally.Right hallux toenail appears to be the worse. No open lesions or pre-ulceration lesions.   Vascular: DP/PT pulses 2/4, CRT less than 3 seconds  There is no pain with calf compression, swelling, warmth, erythema.   Musculoskeletal: No gross boney pedal deformities bilateral. No pain, crepitus, or limitation noted with foot and ankle range of motion bilateral. Muscular strength 5/5 in all groups tested bilateral.  Gait: Unassisted, Nonantalgic.      Assessment & Plan:  82 year old female with symptomatic onychomycosis -Treatment options discussed including all alternatives, risks, and complications -Etiology of symptoms were discussed -Nails were debrided 10 without complications or bleeding.  -Discussed daily foot inspection  -Follow-up in 3 months or sooner if any problems arise. In the meantime, encouraged to call the office with any questions, concerns, change in symptoms.   Celesta Gentile, DPM

## 2016-03-21 DIAGNOSIS — I6529 Occlusion and stenosis of unspecified carotid artery: Secondary | ICD-10-CM | POA: Diagnosis not present

## 2016-03-21 DIAGNOSIS — I10 Essential (primary) hypertension: Secondary | ICD-10-CM | POA: Diagnosis not present

## 2016-03-21 DIAGNOSIS — I420 Dilated cardiomyopathy: Secondary | ICD-10-CM | POA: Diagnosis not present

## 2016-03-21 DIAGNOSIS — Z950 Presence of cardiac pacemaker: Secondary | ICD-10-CM | POA: Diagnosis not present

## 2016-03-21 DIAGNOSIS — K219 Gastro-esophageal reflux disease without esophagitis: Secondary | ICD-10-CM | POA: Diagnosis not present

## 2016-03-21 DIAGNOSIS — Z8673 Personal history of transient ischemic attack (TIA), and cerebral infarction without residual deficits: Secondary | ICD-10-CM | POA: Diagnosis not present

## 2016-03-21 DIAGNOSIS — E785 Hyperlipidemia, unspecified: Secondary | ICD-10-CM | POA: Diagnosis not present

## 2016-03-21 DIAGNOSIS — I951 Orthostatic hypotension: Secondary | ICD-10-CM | POA: Diagnosis not present

## 2016-03-21 DIAGNOSIS — I5022 Chronic systolic (congestive) heart failure: Secondary | ICD-10-CM | POA: Diagnosis not present

## 2016-04-01 ENCOUNTER — Ambulatory Visit: Payer: Medicare Other | Admitting: Podiatry

## 2016-04-09 ENCOUNTER — Other Ambulatory Visit: Payer: Self-pay | Admitting: *Deleted

## 2016-04-09 DIAGNOSIS — Z1212 Encounter for screening for malignant neoplasm of rectum: Secondary | ICD-10-CM

## 2016-04-09 LAB — POC HEMOCCULT BLD/STL (HOME/3-CARD/SCREEN)
FECAL OCCULT BLD: NEGATIVE
FECAL OCCULT BLD: NEGATIVE
Fecal Occult Blood, POC: NEGATIVE

## 2016-04-14 ENCOUNTER — Other Ambulatory Visit: Payer: Self-pay | Admitting: Internal Medicine

## 2016-04-23 ENCOUNTER — Other Ambulatory Visit: Payer: Self-pay | Admitting: Internal Medicine

## 2016-05-01 ENCOUNTER — Ambulatory Visit (INDEPENDENT_AMBULATORY_CARE_PROVIDER_SITE_OTHER): Payer: Medicare Other | Admitting: Internal Medicine

## 2016-05-01 ENCOUNTER — Encounter: Payer: Self-pay | Admitting: Internal Medicine

## 2016-05-01 DIAGNOSIS — E559 Vitamin D deficiency, unspecified: Secondary | ICD-10-CM

## 2016-05-01 DIAGNOSIS — E785 Hyperlipidemia, unspecified: Secondary | ICD-10-CM | POA: Diagnosis not present

## 2016-05-01 DIAGNOSIS — I10 Essential (primary) hypertension: Secondary | ICD-10-CM

## 2016-05-01 DIAGNOSIS — Z79899 Other long term (current) drug therapy: Secondary | ICD-10-CM | POA: Diagnosis not present

## 2016-05-01 DIAGNOSIS — R7303 Prediabetes: Secondary | ICD-10-CM | POA: Diagnosis not present

## 2016-05-01 NOTE — Progress Notes (Signed)
Assessment and Plan:  Hypertension:  -recheck normal -likely elevated due to poor weather on the drive over -Continue medication,  -monitor blood pressure at home.  -Continue DASH diet.   -Reminder to go to the ER if any CP, SOB, nausea, dizziness, severe HA, changes vision/speech, left arm numbness and tingling, and jaw pain.  Cholesterol: -Continue diet and exercise.  -Check cholesterol.   Pre-diabetes: -Continue diet and exercise.  -Check A1C  Vitamin D Def: -check level -continue medications.   Hand Tremor -already on coreg -patient declined further medications -will let us know if it gets worse  Patient declined labs and shingles vaccine today due to cost concerns.  Will check labs at next visit.   Continue diet and meds as discussed. Further disposition pending results of labs.  HPI 82 y.o. female  presents for 3 month follow up with hypertension, hyperlipidemia, prediabetes and vitamin D.   Her blood pressure has been controlled at home, today their BP is BP: (!) 156/60 mmHg.   She does workout. She denies chest pain, shortness of breath, dizziness.   She is on cholesterol medication and denies myalgias. Her cholesterol is at goal. The cholesterol last visit was:   Lab Results  Component Value Date   CHOL 143 01/30/2016   HDL 67 01/30/2016   LDLCALC 60 01/30/2016   TRIG 82 01/30/2016   CHOLHDL 2.1 01/30/2016     She has been working on diet and exercise for prediabetes, and denies foot ulcerations, hyperglycemia, hypoglycemia , increased appetite, nausea, paresthesia of the feet, polydipsia, polyuria, visual disturbances, vomiting and weight loss. Last A1C in the office was:  Lab Results  Component Value Date   HGBA1C 6.0* 01/30/2016    Patient is on Vitamin D supplement.  Lab Results  Component Value Date   VD25OH 46 01/30/2016     She does notice a small tremor of the hand. She is able to write and accomplish tasks.  She sometimes feels like eating  helps it.  It is not disruptive to every day life.    Current Medications:  Current Outpatient Prescriptions on File Prior to Visit  Medication Sig Dispense Refill  . atorvastatin (LIPITOR) 80 MG tablet TAKE 1 TABLET EVERY DAY 90 tablet 1  . b complex vitamins capsule Take 1 capsule by mouth daily.     . bumetanide (BUMEX) 1 MG tablet Take 0.5 mg by mouth daily.  12  . carvedilol (COREG) 6.25 MG tablet Take 6.25 mg by mouth 2 (two) times daily with a meal.    . Cholecalciferol (VITAMIN D3) 1000 UNITS CAPS Take 5,000 Units by mouth 2 (two) times daily.     . clopidogrel (PLAVIX) 75 MG tablet TAKE 1 TABLET BY MOUTH EVERY DAY TO PREVENT HEART ATTACK 30 tablet 6  . FREESTYLE LITE test strip TEST GLUCOSE ONCE DAILY AS DIRECTED 100 each 2  . gabapentin (NEURONTIN) 600 MG tablet TAKE 1 TABLET BY MOUTH AT BEDTIME FOR NEUROPATHY 90 tablet 1  . losartan (COZAAR) 50 MG tablet Take 25 mg by mouth daily.     . magnesium gluconate (MAGONATE) 500 MG tablet Take 500 mg by mouth daily.     . ranitidine (ZANTAC) 300 MG tablet TAKE 1 TABLET TWICE A DAY FOR REFLUX 180 tablet 1   No current facility-administered medications on file prior to visit.    Medical History:  Past Medical History  Diagnosis Date  . Secondary cardiomyopathy, unspecified   . Occlusion and stenosis of carotid artery  without mention of cerebral infarction   . GERD (gastroesophageal reflux disease)   . Diabetes mellitus without complication ([place])   . A-fib ([place])   . Biventricular cardiac pacemaker in situ     05/06/13  Old AICD explanted and 6949 lead capped  RV lead  Medtronic [id number]  LV lead  Medtronic [id number]  [id number] V Medtronic biventricular pacer  PVX [id number]   . Hyperlipidemia   . Peripheral neuropathy ([place])   . History of esophageal stricture   . History of duodenal ulcer   . Transient global amnesia   . Hypertension   . Right groin pain 12/12/2014    Allergies:  Allergies  Allergen Reactions  . Nexium  [Esomeprazole Magnesium] Diarrhea     Review of Systems:  Review of Systems  Constitutional: Negative for fever, chills and malaise/fatigue.  HENT: Negative for congestion, ear pain and sore throat.   Eyes: Negative.   Respiratory: Negative for cough, shortness of breath and wheezing.   Cardiovascular: Negative for chest pain, palpitations and leg swelling.  Gastrointestinal: Negative for heartburn, abdominal pain, diarrhea, constipation, blood in stool and melena.  Genitourinary: Negative.   Skin: Negative.   Neurological: Negative for dizziness, sensory change, loss of consciousness and headaches.  Psychiatric/Behavioral: Negative for depression. The patient is not nervous/anxious and does not have insomnia.     Family history- Review and unchanged  Social history- Review and unchanged  Physical Exam: BP 156/60 mmHg  Pulse 62  Temp(Src) 97.8 F (36.6 C) (Temporal)  Resp 16  Ht 5\' 3"  (1.6 m)  Wt 146 lb (66.225 kg)  BMI 25.87 kg/m2 Wt Readings from Last 3 Encounters:  05/01/16 146 lb (66.225 kg)  03/11/16 150 lb (68.04 kg)  02/14/16 151 lb 9.6 oz (68.765 kg)    General Appearance: Well nourished well developed, in no apparent distress. Eyes: PERRLA, EOMs, conjunctiva no swelling or erythema ENT/Mouth: Ear canals normal without obstruction, swelling, erythma, discharge.  TMs normal bilaterally.  Oropharynx moist, clear, without exudate, or postoropharyngeal swelling. Neck: Supple, thyroid normal,no cervical adenopathy  Respiratory: Respiratory effort normal, Breath sounds clear A&P without rhonchi, wheeze, or rale.  No retractions, no accessory usage. Cardio: RRR with no MRGs. Brisk peripheral pulses without edema.  Abdomen: Soft, + BS,  Non tender, no guarding, rebound, hernias, masses. Musculoskeletal: Full ROM, 5/5 strength, Normal gait Skin: Warm, dry without rashes, lesions, ecchymosis.  Neuro: Awake and oriented X 3, Cranial nerves intact. Normal muscle tone, no  cerebellar symptoms. Psych: Normal affect, Insight and Judgment appropriate.    Starlyn Skeans, PA-C 10:16 AM [facility]

## 2016-05-07 DIAGNOSIS — R5383 Other fatigue: Secondary | ICD-10-CM | POA: Diagnosis not present

## 2016-05-07 DIAGNOSIS — Z01419 Encounter for gynecological examination (general) (routine) without abnormal findings: Secondary | ICD-10-CM | POA: Diagnosis not present

## 2016-05-07 DIAGNOSIS — Z6826 Body mass index (BMI) 26.0-26.9, adult: Secondary | ICD-10-CM | POA: Diagnosis not present

## 2016-05-07 DIAGNOSIS — R42 Dizziness and giddiness: Secondary | ICD-10-CM | POA: Diagnosis not present

## 2016-05-10 DIAGNOSIS — Z803 Family history of malignant neoplasm of breast: Secondary | ICD-10-CM | POA: Diagnosis not present

## 2016-05-10 DIAGNOSIS — Z1231 Encounter for screening mammogram for malignant neoplasm of breast: Secondary | ICD-10-CM | POA: Diagnosis not present

## 2016-05-14 DIAGNOSIS — R140 Abdominal distension (gaseous): Secondary | ICD-10-CM | POA: Diagnosis not present

## 2016-05-15 ENCOUNTER — Encounter: Payer: Self-pay | Admitting: Internal Medicine

## 2016-06-05 ENCOUNTER — Encounter: Payer: Medicare Other | Admitting: Internal Medicine

## 2016-06-17 ENCOUNTER — Ambulatory Visit: Payer: Medicare Other | Admitting: Podiatry

## 2016-06-20 DIAGNOSIS — Z950 Presence of cardiac pacemaker: Secondary | ICD-10-CM | POA: Diagnosis not present

## 2016-06-20 DIAGNOSIS — Z8673 Personal history of transient ischemic attack (TIA), and cerebral infarction without residual deficits: Secondary | ICD-10-CM | POA: Diagnosis not present

## 2016-06-20 DIAGNOSIS — I420 Dilated cardiomyopathy: Secondary | ICD-10-CM | POA: Diagnosis not present

## 2016-06-20 DIAGNOSIS — I951 Orthostatic hypotension: Secondary | ICD-10-CM | POA: Diagnosis not present

## 2016-06-20 DIAGNOSIS — E785 Hyperlipidemia, unspecified: Secondary | ICD-10-CM | POA: Diagnosis not present

## 2016-06-20 DIAGNOSIS — I6529 Occlusion and stenosis of unspecified carotid artery: Secondary | ICD-10-CM | POA: Diagnosis not present

## 2016-06-20 DIAGNOSIS — K219 Gastro-esophageal reflux disease without esophagitis: Secondary | ICD-10-CM | POA: Diagnosis not present

## 2016-06-20 DIAGNOSIS — I10 Essential (primary) hypertension: Secondary | ICD-10-CM | POA: Diagnosis not present

## 2016-06-20 DIAGNOSIS — I5022 Chronic systolic (congestive) heart failure: Secondary | ICD-10-CM | POA: Diagnosis not present

## 2016-07-01 ENCOUNTER — Encounter: Payer: Self-pay | Admitting: Podiatry

## 2016-07-01 ENCOUNTER — Ambulatory Visit (INDEPENDENT_AMBULATORY_CARE_PROVIDER_SITE_OTHER): Payer: Medicare Other | Admitting: Podiatry

## 2016-07-01 DIAGNOSIS — B351 Tinea unguium: Secondary | ICD-10-CM | POA: Diagnosis not present

## 2016-07-01 DIAGNOSIS — M79676 Pain in unspecified toe(s): Secondary | ICD-10-CM | POA: Diagnosis not present

## 2016-07-02 NOTE — Progress Notes (Signed)
Subjective: 82 y.o. returns the office today for painful, elongated, thickened toenails which she cannot trim herself. Denies any redness or drainage around the nails. She states the right hallux toenail has not come off yet. Denies any acute changes since last appointment and no new complaints today. Denies any systemic complaints such as fevers, chills, nausea, vomiting.   Objective: AAO 3, NAD DP/PT pulses palpable, CRT less than 3 seconds Nails hypertrophic, dystrophic, elongated, brittle, discolored 10. There is tenderness overlying the nails 1-5 bilaterally. There is no surrounding erythema or drainage along the nail sites. No open lesions or pre-ulcerative lesions are identified. No other areas of tenderness bilateral lower extremities. No overlying edema, erythema, increased warmth. No pain with calf compression, swelling, warmth, erythema.  Assessment: Patient presents with symptomatic onychomycosis  Plan: -Treatment options including alternatives, risks, complications were discussed -Nails sharply debrided 10 without complication/bleeding. -Discussed total nail avulsion to the right hallux. She does not want it done permanently. I believe it is going to come back the same way. She will hold off and wishes to continue with debridements.  -Discussed daily foot inspection. If there are any changes, to call the office immediately.  -Follow-up in 3 months or sooner if any problems are to arise. In the meantime, encouraged to call the office with any questions, concerns, changes symptoms.  Celesta Gentile, DPM

## 2016-07-08 ENCOUNTER — Other Ambulatory Visit: Payer: Self-pay | Admitting: Internal Medicine

## 2016-07-22 ENCOUNTER — Encounter: Payer: Self-pay | Admitting: Internal Medicine

## 2016-07-26 ENCOUNTER — Encounter: Payer: Self-pay | Admitting: *Deleted

## 2016-08-02 ENCOUNTER — Ambulatory Visit (INDEPENDENT_AMBULATORY_CARE_PROVIDER_SITE_OTHER): Payer: Medicare Other | Admitting: Internal Medicine

## 2016-08-02 ENCOUNTER — Encounter: Payer: Self-pay | Admitting: Internal Medicine

## 2016-08-02 VITALS — BP 128/72 | HR 68 | Ht 63.0 in | Wt 149.0 lb

## 2016-08-02 DIAGNOSIS — I447 Left bundle-branch block, unspecified: Secondary | ICD-10-CM | POA: Diagnosis not present

## 2016-08-02 DIAGNOSIS — I2510 Atherosclerotic heart disease of native coronary artery without angina pectoris: Secondary | ICD-10-CM

## 2016-08-02 NOTE — Progress Notes (Signed)
HPI [name] returns today for followup.  She is a very pleasant 82 year old woman with a history of chronic systolic heart failure, status post ICD implantation, who underwent removal of her prior ICD and insertion of a biventricular pacemaker. She returns today for followup.  She denies shortness of breath. No syncope. No edema. No fever or chills. She denies chest pain.  Allergies  Allergen Reactions  . Nexium [Esomeprazole Magnesium] Diarrhea     Current Outpatient Prescriptions  Medication Sig Dispense Refill  . aspirin EC 81 MG tablet Take 81 mg by mouth daily.    Marland Kitchen atorvastatin (LIPITOR) 80 MG tablet TAKE 1 TABLET EVERY DAY 30 tablet 3  . b complex vitamins capsule Take 1 capsule by mouth daily.     . bumetanide (BUMEX) 1 MG tablet Take 0.5 mg by mouth daily.  12  . carvedilol (COREG) 6.25 MG tablet Take 6.25 mg by mouth 2 (two) times daily with a meal.    . Cholecalciferol (VITAMIN D3) 1000 UNITS CAPS Take 5,000 Units by mouth 2 (two) times daily.     . clopidogrel (PLAVIX) 75 MG tablet TAKE 1 TABLET BY MOUTH EVERY DAY TO PREVENT HEART ATTACK 30 tablet 6  . FREESTYLE LITE test strip TEST GLUCOSE ONCE DAILY AS DIRECTED 100 each 2  . gabapentin (NEURONTIN) 600 MG tablet TAKE 1 TABLET BY MOUTH AT BEDTIME FOR NEUROPATHY 90 tablet 1  . losartan (COZAAR) 50 MG tablet Take 25 mg by mouth daily.     . magnesium gluconate (MAGONATE) 500 MG tablet Take 500 mg by mouth daily.     . ranitidine (ZANTAC) 300 MG tablet TAKE 1 TABLET TWICE A DAY FOR REFLUX 180 tablet 1   No current facility-administered medications for this visit.      Past Medical History:  Diagnosis Date  . A-fib ([place])   . Biventricular cardiac pacemaker in situ    05/06/13  Old AICD explanted and 6949 lead capped  RV lead  Medtronic [id number]  LV lead  Medtronic [id number]  [id number] V Medtronic biventricular pacer  PVX [id number]   . Diabetes mellitus without complication ([place])   . GERD (gastroesophageal reflux disease)    . History of duodenal ulcer   . History of esophageal stricture   . Hyperlipidemia   . Hypertension   . Occlusion and stenosis of carotid artery without mention of cerebral infarction   . Peripheral neuropathy ([place])   . Right groin pain 12/12/2014  . Secondary cardiomyopathy, unspecified   . Transient global amnesia     ROS:   All systems reviewed and negative except as noted in the HPI.   Past Surgical History:  Procedure Laterality Date  . ABDOMINAL HYSTERECTOMY    . APPENDECTOMY    . BI-VENTRICULAR PACEMAKER UPGRADE N/A 05/05/2013   Procedure: BI-VENTRICULAR PACEMAKER UPGRADE;  Surgeon: Evans Lance, MD;  Location[facility] CATH LAB;  Service: Cardiovascular;  Laterality: N/A;  . BIV PACEMAKER GENERATOR CHANGE OUT  05/05/2013   MDT CRTP upgrade (previously implanted dual chamber ICD) by Dr Lovena Le.  New RV/LV leads placed.   Marland Kitchen BREAST SURGERY Left 1985   Negative  Biopsy  . CARDIAC CATHETERIZATION     04/2003  . CARDIAC DEFIBRILLATOR PLACEMENT  09/2004   MDT ICD implanted for primary prevention; device upgrade to CRTP in 04-2013  . CARPAL TUNNEL RELEASE    . CHOLECYSTECTOMY       Family History  Problem Relation Age of Onset  . Dementia Mother   .  Diabetes Mother   . Heart Problems Father   . Heart attack Son   . Heart disease Son   . Heart disease Brother   . Heart attack Brother   . Cancer Maternal Grandmother     breast  . Stroke Daughter   . Hypertension Daughter   . Hyperlipidemia Daughter   . Diabetes Daughter   . Diabetes Sister   . Hypertension Sister   . Hyperlipidemia Sister   . Hypertension Sister   . Hyperlipidemia Sister   . Cancer Sister     Breast  . Heart Problems Sister      Social History   Social History  . Marital status: Widowed    Spouse name: N/A  . Number of children: 2  . Years of education: 12   Occupational History  . Not on file.   Social History Main Topics  . Smoking status: Passive Smoke Exposure - Never Smoker  .  Smokeless tobacco: Never Used     Comment: worked at [employer]  . Alcohol use No  . Drug use: No  . Sexual activity: Not on file   Other Topics Concern  . Not on file   Social History Narrative   Caffeine Daily average (3 cups am).  Retired.    Widowed.  6 KIDS.  Lives with daughter at her home.     BP 128/72   Pulse 68   Ht 5\' 3"  (1.6 m)   Wt 149 lb (67.6 kg)   BMI 26.39 kg/m   Physical Exam:  Well appearing elderly woman, NAD HEENT: Unremarkable Neck:  6 cm JVD, no thyromegally Back:  No CVA tenderness Lungs:  Clear with no wheezes, rales, or rhonchi. Well-healed pacemaker incision. Tenderness over her device with no erythema or swelling HEART:  Regular rate rhythm, no murmurs, no rubs, no clicks Abd:  soft, positive bowel sounds, no organomegally, no rebound, no guarding Ext:  2 plus pulses, no edema, no cyanosis, no clubbing Skin:  No rashes no nodules Neuro:  CN II through XII intact, motor grossly intact  ECG - NSR with BiV pacing  DEVICE  Normal device function.  See PaceArt for details.   Assess/Plan: 1. Chronic systolic heart failure - she remains class 2. She will continue her current meds. 2. BiV PPM - her medtronic BiV PM is working normally. Will recheck in several months. 3. HTN - her blood pressure is well controlled. She will maintain a low sodium diet.  Mikle Bosworth.D.

## 2016-08-02 NOTE — Patient Instructions (Signed)
Medication Instructions:  Your physician recommends that you continue on your current medications as directed. Please refer to the Current Medication list given to you today.   Labwork: None Ordered   Testing/Procedures: None Ordered   Follow-Up: Your physician wants you to follow-up in: 1 year with Dr. Taylor.  You will receive a reminder letter in the mail two months in advance. If you don't receive a letter, please call our office to schedule the follow-up appointment.   If you need a refill on your cardiac medications before your next appointment, please call your pharmacy.   Thank you for choosing [facility]! Michelle Swinyer, RN [phone]    

## 2016-08-06 ENCOUNTER — Encounter: Payer: Self-pay | Admitting: Internal Medicine

## 2016-08-06 ENCOUNTER — Ambulatory Visit (INDEPENDENT_AMBULATORY_CARE_PROVIDER_SITE_OTHER): Payer: Medicare Other | Admitting: Internal Medicine

## 2016-08-06 VITALS — BP 158/76 | HR 64 | Temp 97.2°F | Resp 16 | Ht 63.0 in | Wt 150.2 lb

## 2016-08-06 DIAGNOSIS — I2510 Atherosclerotic heart disease of native coronary artery without angina pectoris: Secondary | ICD-10-CM

## 2016-08-06 DIAGNOSIS — Z23 Encounter for immunization: Secondary | ICD-10-CM

## 2016-08-06 DIAGNOSIS — N183 Chronic kidney disease, stage 3 unspecified: Secondary | ICD-10-CM

## 2016-08-06 DIAGNOSIS — E785 Hyperlipidemia, unspecified: Secondary | ICD-10-CM | POA: Diagnosis not present

## 2016-08-06 DIAGNOSIS — E559 Vitamin D deficiency, unspecified: Secondary | ICD-10-CM | POA: Diagnosis not present

## 2016-08-06 DIAGNOSIS — R7303 Prediabetes: Secondary | ICD-10-CM | POA: Diagnosis not present

## 2016-08-06 DIAGNOSIS — I10 Essential (primary) hypertension: Secondary | ICD-10-CM | POA: Diagnosis not present

## 2016-08-06 DIAGNOSIS — Z79899 Other long term (current) drug therapy: Secondary | ICD-10-CM

## 2016-08-06 LAB — BASIC METABOLIC PANEL WITH GFR
BUN: 16 mg/dL (ref 7–25)
CALCIUM: 9.1 mg/dL (ref 8.6–10.4)
CO2: 27 mmol/L (ref 20–31)
CREATININE: 1.26 mg/dL — AB (ref 0.60–0.88)
Chloride: 108 mmol/L (ref 98–110)
GFR, Est African American: 44 mL/min — ABNORMAL LOW (ref >=60)
GFR, Est Non African American: 38 mL/min — ABNORMAL LOW (ref >=60)
Glucose, Bld: 102 mg/dL — ABNORMAL HIGH (ref 65–99)
Potassium: 4.4 mmol/L (ref 3.5–5.3)
SODIUM: 143 mmol/L (ref 135–146)

## 2016-08-06 LAB — CBC WITH DIFFERENTIAL/PLATELET
BASOS PCT: 1 %
Basophils Absolute: 73 cells/uL (ref 0–200)
Eosinophils Absolute: 292 cells/uL (ref 15–500)
Eosinophils Relative: 4 %
HCT: 38.6 % (ref 35.0–45.0)
HEMOGLOBIN: 12.9 g/dL (ref 11.7–15.5)
LYMPHS ABS: 1971 {cells}/uL (ref 850–3900)
Lymphocytes Relative: 27 %
MCH: 30.3 pg (ref 27.0–33.0)
MCHC: 33.4 g/dL (ref 32.0–36.0)
MCV: 90.6 fL (ref 80.0–100.0)
MONO ABS: 511 {cells}/uL (ref 200–950)
MPV: 11.4 fL (ref 7.5–12.5)
Monocytes Relative: 7 %
NEUTROS ABS: 4453 {cells}/uL (ref 1500–7800)
Neutrophils Relative %: 61 %
PLATELETS: 286 10*3/uL (ref 140–400)
RBC: 4.26 MIL/uL (ref 3.80–5.10)
RDW: 13.1 % (ref 11.0–15.0)
WBC: 7.3 10*3/uL (ref 3.8–10.8)

## 2016-08-06 LAB — HEPATIC FUNCTION PANEL
ALT: 12 U/L (ref 6–29)
AST: 17 U/L (ref 10–35)
Albumin: 4 g/dL (ref 3.6–5.1)
Alkaline Phosphatase: 59 U/L (ref 33–130)
BILIRUBIN DIRECT: 0.1 mg/dL (ref <=0.2)
BILIRUBIN TOTAL: 0.5 mg/dL (ref 0.2–1.2)
Indirect Bilirubin: 0.4 mg/dL (ref 0.2–1.2)
Total Protein: 6.3 g/dL (ref 6.1–8.1)

## 2016-08-06 LAB — LIPID PANEL
CHOL/HDL RATIO: 2.7 ratio (ref <=5.0)
Cholesterol: 164 mg/dL (ref 125–200)
HDL: 61 mg/dL (ref >=46)
LDL CALC: 76 mg/dL (ref <130)
TRIGLYCERIDES: 136 mg/dL (ref <150)
VLDL: 27 mg/dL (ref <30)

## 2016-08-06 LAB — TSH: TSH: 2.19 mIU/L

## 2016-08-06 LAB — HEMOGLOBIN A1C
Hgb A1c MFr Bld: 5.7 % — ABNORMAL HIGH (ref <5.7)
Mean Plasma Glucose: 117 mg/dL

## 2016-08-06 LAB — MAGNESIUM: Magnesium: 1.8 mg/dL (ref 1.5–2.5)

## 2016-08-06 NOTE — Patient Instructions (Signed)

## 2016-08-06 NOTE — Progress Notes (Signed)
[facility]                       Unk Pinto, M.D.        Uvaldo Bristle. Silverio Lay, P.A.-C       Starlyn Skeans, P.A.-C   [facility]                [address] [place], [place] [id number] Telephone [phone] Telefax [phone] ______________________________________________________________________     This very nice 82 y.o.WWF presents for 6 month follow up with Hypertension, Hyperlipidemia, Pre-Diabetes and Vitamin D Deficiency.      Patient is treated for HTN circa 1988  & BP has been controlled at home. Today's BP is 1658 /86 and rechecked at 142 /82. In 2004 , patient had a PPM implantedand replaced in 2008 with a AICD/defibrillator. In 2010, she had a neg Stress Myoview.  Patient has had no complaints of any cardiac type chest pain, palpitations, dyspnea/orthopnea/PND, dizziness, claudication, or dependent edema.     Hyperlipidemia is controlled with diet & meds. Patient denies myalgias or other med SE's. Last Lipids were at goal and patient is advised to stop her her Atorvastatin due to her age of 82 y.o.  Lab Results  Component Value Date   CHOL 143 01/30/2016   HDL 67 01/30/2016   LDLCALC 60 01/30/2016   TRIG 82 01/30/2016   CHOLHDL 2.1 01/30/2016      Also, the patient has history of PreDiabetes with A1c 6.0% in 2012 and has had no symptoms of reactive hypoglycemia, diabetic polys, paresthesias or visual blurring.  Last A1c was still  not at goal:  Lab Results  Component Value Date   HGBA1C 6.0 (H) 01/30/2016      Further, the patient also has history of Vitamin D Deficiency of "33" in 2008 and supplements vitamin D without any suspected side-effects. Last vitamin D was low & not at goal:  Lab Results  Component Value Date   VD25OH 46 01/30/2016   Current Outpatient Prescriptions on File Prior to Visit  Medication Sig  . aspirin EC 81 MG Take daily.  . Atorvastatin 80 MG TAKE 1 TAB  EVERY DAY  . b complex vitamins  Take 1 cap daily.   . bumetanide  1 MG  Take 0.5 mg  daily.  . carvedilol  6.25 MG  Take  2  times daily   . clopidogrel 75 MG  TAKE 1 TAB EVERY DAY   . gabapentin 600 MG  TAKE 1 TAB AT BEDTIME   . losartan  50 MG  Take 25 mg daily.   . magnesium  500 MG  Take  daily.   . ranitidine  300 MG  TAKE 1 TABLET TWICE A DAY FOR REFLUX   Allergies  Allergen Reactions  . Nexium [Esomeprazole Magnesium] Diarrhea   PMHx:   Past Medical History:  Diagnosis Date  . A-fib ([place])   . Biventricular cardiac pacemaker in situ    05/06/13  Old AICD explanted and 6949 lead capped  RV lead  Medtronic [id number]  LV lead  Medtronic [id number]  [id number] V Medtronic biventricular pacer  PVX [id number]   . Diabetes mellitus without complication ([place])   . GERD (gastroesophageal reflux disease)   . History of duodenal ulcer   . History of esophageal stricture   . Hyperlipidemia   .  Hypertension   . Occlusion and stenosis of carotid artery without mention of cerebral infarction   . Peripheral neuropathy ([place])   . Right groin pain 12/12/2014  . Secondary cardiomyopathy, unspecified   . Transient global amnesia    Immunization History  Administered Date(s) Administered  . DT 10/03/2015  . Influenza, High Dose Seasonal PF 10/19/2013, 08/06/2016  . Influenza-Unspecified 10/08/2012, 02/03/2015  . Pneumococcal Conjugate-13 08/31/2014  . Pneumococcal-Unspecified 12/09/2002  . Td 12/09/2002  . Zoster 08/06/2016   Past Surgical History:  Procedure Laterality Date  . ABDOMINAL HYSTERECTOMY    . APPENDECTOMY    . BI-VENTRICULAR PACEMAKER UPGRADE N/A 05/05/2013   Procedure: BI-VENTRICULAR PACEMAKER UPGRADE;  Surgeon: Evans Lance, MD;  Location: [facility] CATH LAB;  Service: Cardiovascular;  Laterality: N/A;  . BIV PACEMAKER GENERATOR CHANGE OUT  05/05/2013   MDT CRTP upgrade (previously implanted dual chamber ICD) by Dr Lovena Le.  New RV/LV leads placed.   Marland Kitchen BREAST SURGERY Left 1985    Negative  Biopsy  . CARDIAC CATHETERIZATION     04/2003  . CARDIAC DEFIBRILLATOR PLACEMENT  09/2004   MDT ICD implanted for primary prevention; device upgrade to CRTP in 04-2013  . CARPAL TUNNEL RELEASE    . CHOLECYSTECTOMY     FHx:    Reviewed / unchanged  SHx:    Reviewed / unchanged  Systems Review:  Constitutional: Denies fever, chills, wt changes, headaches, insomnia, fatigue, night sweats, change in appetite. Eyes: Denies redness, blurred vision, diplopia, discharge, itchy, watery eyes.  ENT: Denies discharge, congestion, post nasal drip, epistaxis, sore throat, earache, hearing loss, dental pain, tinnitus, vertigo, sinus pain, snoring.  CV: Denies chest pain, palpitations, irregular heartbeat, syncope, dyspnea, diaphoresis, orthopnea, PND, claudication or edema. Respiratory: denies cough, dyspnea, DOE, pleurisy, hoarseness, laryngitis, wheezing.  Gastrointestinal: Denies dysphagia, odynophagia, heartburn, reflux, water brash, abdominal pain or cramps, nausea, vomiting, bloating, diarrhea, constipation, hematemesis, melena, hematochezia  or hemorrhoids. Genitourinary: Denies dysuria, frequency, urgency, nocturia, hesitancy, discharge, hematuria or flank pain. Musculoskeletal: Denies arthralgias, myalgias, stiffness, jt. swelling, pain, limping or strain/sprain.  Skin: Denies pruritus, rash, hives, warts, acne, eczema or change in skin lesion(s). Neuro: No weakness, tremor, incoordination, spasms, paresthesia or pain. Psychiatric: Denies confusion, memory loss or sensory loss. Endo: Denies change in weight, skin or hair change.  Heme/Lymph: No excessive bleeding, bruising or enlarged lymph nodes.  Physical Exam BP (!) 158/76   Pulse 64   Temp 97.2 F (36.2 C)   Resp 16   Ht 5\' 3"  (1.6 m)   Wt 150 lb 3.2 oz (68.1 kg)   BMI 26.61 kg/m   Appears well nourished and in no distress.  Eyes: PERRLA, EOMs, conjunctiva no swelling or erythema. Sinuses: No frontal/maxillary  tenderness ENT/Mouth: EAC's clear, TM's nl w/o erythema, bulging. Nares clear w/o erythema, swelling, exudates. Oropharynx clear without erythema or exudates. Oral hygiene is good. Tongue normal, non obstructing. Hearing intact.  Neck: Supple. Thyroid nl. Car 2+/2+ without bruits, nodes or JVD. Chest: Respirations nl with BS clear & equal w/o rales, rhonchi, wheezing or stridor.  Cor: Heart sounds normal w/ regular rate and rhythm without sig. murmurs, gallops, clicks, or rubs. Peripheral pulses normal and equal  without edema.  Abdomen: Soft & bowel sounds normal. Non-tender w/o guarding, rebound, hernias, masses, or organomegaly.  Lymphatics: Unremarkable.  Musculoskeletal: Full ROM all peripheral extremities, joint stability, 5/5 strength, and normal gait.  Skin: Warm, dry without exposed rashes, lesions or ecchymosis apparent.  Neuro: Cranial nerves intact, reflexes equal bilaterally.  Sensory-motor testing grossly intact. Tendon reflexes grossly intact.  Pysch: Alert & oriented x 3.  Insight and judgement nl & appropriate. No ideations.  Assessment and Plan:  1. Essential hypertension  - Continue medication, monitor blood pressure at home. Continue DASH diet. Reminder to go to the ER if any CP, SOB, nausea, dizziness, severe HA, changes vision/speech, left arm numbness and tingling and jaw pain. - TSH  2. Hyperlipidemia  - Continue diet/meds, exercise,& lifestyle modifications. Continue monitor periodic cholesterol/liver& renal functions  - Lipid panel - TSH  3. Prediabetes  - Continue diet, exercise, lifestyle modifications. Monitor appropriate labs. - Hemoglobin A1c - Insulin, random  4. Vitamin D deficiency  - Continue supplementation. - VITAMIN D 25 Hydroxy   5. CKD (chronic kidney disease), stage III   6. ASHD (arteriosclerotic heart disease)   7. Medication management  - BASIC METABOLIC PANEL WITH GFR - Hepatic function panel - CBC with  Differential/Platelet - Magnesium  8. Need for prophylactic vaccination and inoculation against influenza  - Flu vaccine HIGH DOSE PF (Fluzone High dose)  9. Need for prophylactic vaccination and inoculation against varicella  - Varicella-zoster vaccine subcutaneous   Recommended regular exercise, BP monitoring, weight control, and discussed med and SE's. Recommended labs to assess and monitor clinical status. Further disposition pending results of labs. Over 30 minutes of exam, counseling, chart review was performed

## 2016-08-07 ENCOUNTER — Other Ambulatory Visit: Payer: Self-pay | Admitting: Internal Medicine

## 2016-08-07 LAB — VITAMIN D 25 HYDROXY (VIT D DEFICIENCY, FRACTURES): VIT D 25 HYDROXY: 62 ng/mL (ref 30–100)

## 2016-08-07 LAB — INSULIN, RANDOM: INSULIN: 4.3 u[IU]/mL (ref 2.0–19.6)

## 2016-08-07 MED ORDER — PREDNISONE 20 MG PO TABS: ORAL_TABLET | ORAL | 0 refills | Status: DC

## 2016-09-03 DIAGNOSIS — I420 Dilated cardiomyopathy: Secondary | ICD-10-CM | POA: Diagnosis not present

## 2016-09-03 DIAGNOSIS — Z8673 Personal history of transient ischemic attack (TIA), and cerebral infarction without residual deficits: Secondary | ICD-10-CM | POA: Diagnosis not present

## 2016-09-03 DIAGNOSIS — Z950 Presence of cardiac pacemaker: Secondary | ICD-10-CM | POA: Diagnosis not present

## 2016-09-03 DIAGNOSIS — I5022 Chronic systolic (congestive) heart failure: Secondary | ICD-10-CM | POA: Diagnosis not present

## 2016-09-03 DIAGNOSIS — K219 Gastro-esophageal reflux disease without esophagitis: Secondary | ICD-10-CM | POA: Diagnosis not present

## 2016-09-03 DIAGNOSIS — I951 Orthostatic hypotension: Secondary | ICD-10-CM | POA: Diagnosis not present

## 2016-09-03 DIAGNOSIS — I6529 Occlusion and stenosis of unspecified carotid artery: Secondary | ICD-10-CM | POA: Diagnosis not present

## 2016-09-03 DIAGNOSIS — I10 Essential (primary) hypertension: Secondary | ICD-10-CM | POA: Diagnosis not present

## 2016-09-03 DIAGNOSIS — E785 Hyperlipidemia, unspecified: Secondary | ICD-10-CM | POA: Diagnosis not present

## 2016-09-18 DIAGNOSIS — H35371 Puckering of macula, right eye: Secondary | ICD-10-CM | POA: Diagnosis not present

## 2016-09-18 DIAGNOSIS — E119 Type 2 diabetes mellitus without complications: Secondary | ICD-10-CM | POA: Diagnosis not present

## 2016-09-18 DIAGNOSIS — H35362 Drusen (degenerative) of macula, left eye: Secondary | ICD-10-CM | POA: Diagnosis not present

## 2016-09-23 DIAGNOSIS — E785 Hyperlipidemia, unspecified: Secondary | ICD-10-CM | POA: Diagnosis not present

## 2016-09-23 DIAGNOSIS — I420 Dilated cardiomyopathy: Secondary | ICD-10-CM | POA: Diagnosis not present

## 2016-09-23 DIAGNOSIS — I10 Essential (primary) hypertension: Secondary | ICD-10-CM | POA: Diagnosis not present

## 2016-09-23 DIAGNOSIS — I6529 Occlusion and stenosis of unspecified carotid artery: Secondary | ICD-10-CM | POA: Diagnosis not present

## 2016-09-23 DIAGNOSIS — R0609 Other forms of dyspnea: Secondary | ICD-10-CM | POA: Diagnosis not present

## 2016-09-23 DIAGNOSIS — I951 Orthostatic hypotension: Secondary | ICD-10-CM | POA: Diagnosis not present

## 2016-09-23 DIAGNOSIS — Z8673 Personal history of transient ischemic attack (TIA), and cerebral infarction without residual deficits: Secondary | ICD-10-CM | POA: Diagnosis not present

## 2016-09-23 DIAGNOSIS — Z950 Presence of cardiac pacemaker: Secondary | ICD-10-CM | POA: Diagnosis not present

## 2016-09-23 DIAGNOSIS — K219 Gastro-esophageal reflux disease without esophagitis: Secondary | ICD-10-CM | POA: Diagnosis not present

## 2016-09-23 DIAGNOSIS — I5022 Chronic systolic (congestive) heart failure: Secondary | ICD-10-CM | POA: Diagnosis not present

## 2016-10-03 ENCOUNTER — Other Ambulatory Visit: Payer: Self-pay | Admitting: Internal Medicine

## 2016-10-07 ENCOUNTER — Ambulatory Visit (INDEPENDENT_AMBULATORY_CARE_PROVIDER_SITE_OTHER): Payer: Medicare Other | Admitting: Podiatry

## 2016-10-07 ENCOUNTER — Encounter: Payer: Self-pay | Admitting: Podiatry

## 2016-10-07 DIAGNOSIS — M79676 Pain in unspecified toe(s): Secondary | ICD-10-CM

## 2016-10-07 DIAGNOSIS — B351 Tinea unguium: Secondary | ICD-10-CM

## 2016-10-08 NOTE — Progress Notes (Signed)
Subjective: 82 y.o. returns the office today for painful, elongated, thickened toenails which she cannot trim herself. Denies any redness or drainage around the nails. Denies any acute changes since last appointment and no new complaints today. Denies any systemic complaints such as fevers, chills, nausea, vomiting.   Objective: AAO 3, NAD DP/PT pulses palpable, CRT less than 3 seconds Nails hypertrophic, dystrophic, elongated, brittle, discolored 10. There is tenderness overlying the nails 1-5 bilaterally. There is no surrounding erythema or drainage along the nail sites. No open lesions or pre-ulcerative lesions are identified. No other areas of tenderness bilateral lower extremities. No overlying edema, erythema, increased warmth. No pain with calf compression, swelling, warmth, erythema.  Assessment: Patient presents with symptomatic onychomycosis  Plan: -Treatment options including alternatives, risks, complications were discussed -Nails sharply debrided 10 without complication/bleeding.  -Discussed daily foot inspection. If there are any changes, to call the office immediately.  -Follow-up in 3 months or sooner if any problems are to arise. In the meantime, encouraged to call the office with any questions, concerns, changes symptoms.  Matthew Wagoner, DPM  

## 2016-10-15 ENCOUNTER — Other Ambulatory Visit: Payer: Self-pay | Admitting: Internal Medicine

## 2016-11-09 ENCOUNTER — Encounter: Payer: Self-pay | Admitting: *Deleted

## 2016-11-19 ENCOUNTER — Encounter: Payer: Self-pay | Admitting: Physician Assistant

## 2016-11-19 ENCOUNTER — Ambulatory Visit (INDEPENDENT_AMBULATORY_CARE_PROVIDER_SITE_OTHER): Payer: Medicare Other | Admitting: Physician Assistant

## 2016-11-19 VITALS — BP 150/80 | HR 78 | Temp 97.4°F | Resp 14 | Ht 63.0 in | Wt 151.4 lb

## 2016-11-19 DIAGNOSIS — I2510 Atherosclerotic heart disease of native coronary artery without angina pectoris: Secondary | ICD-10-CM

## 2016-11-19 DIAGNOSIS — R6889 Other general symptoms and signs: Secondary | ICD-10-CM

## 2016-11-19 DIAGNOSIS — Z950 Presence of cardiac pacemaker: Secondary | ICD-10-CM | POA: Diagnosis not present

## 2016-11-19 DIAGNOSIS — K219 Gastro-esophageal reflux disease without esophagitis: Secondary | ICD-10-CM | POA: Diagnosis not present

## 2016-11-19 DIAGNOSIS — I429 Cardiomyopathy, unspecified: Secondary | ICD-10-CM | POA: Diagnosis not present

## 2016-11-19 DIAGNOSIS — Z0001 Encounter for general adult medical examination with abnormal findings: Secondary | ICD-10-CM

## 2016-11-19 DIAGNOSIS — Z79899 Other long term (current) drug therapy: Secondary | ICD-10-CM

## 2016-11-19 DIAGNOSIS — I10 Essential (primary) hypertension: Secondary | ICD-10-CM | POA: Diagnosis not present

## 2016-11-19 DIAGNOSIS — E785 Hyperlipidemia, unspecified: Secondary | ICD-10-CM | POA: Diagnosis not present

## 2016-11-19 DIAGNOSIS — R55 Syncope and collapse: Secondary | ICD-10-CM

## 2016-11-19 DIAGNOSIS — I5022 Chronic systolic (congestive) heart failure: Secondary | ICD-10-CM | POA: Diagnosis not present

## 2016-11-19 DIAGNOSIS — I4460 Unspecified fascicular block: Secondary | ICD-10-CM

## 2016-11-19 DIAGNOSIS — I420 Dilated cardiomyopathy: Secondary | ICD-10-CM

## 2016-11-19 DIAGNOSIS — N183 Chronic kidney disease, stage 3 unspecified: Secondary | ICD-10-CM

## 2016-11-19 DIAGNOSIS — Z0000 Encounter for general adult medical examination without abnormal findings: Secondary | ICD-10-CM

## 2016-11-19 DIAGNOSIS — E559 Vitamin D deficiency, unspecified: Secondary | ICD-10-CM | POA: Diagnosis not present

## 2016-11-19 DIAGNOSIS — R42 Dizziness and giddiness: Secondary | ICD-10-CM

## 2016-11-19 DIAGNOSIS — R7303 Prediabetes: Secondary | ICD-10-CM | POA: Diagnosis not present

## 2016-11-19 LAB — CBC WITH DIFFERENTIAL/PLATELET
BASOS PCT: 1 %
Basophils Absolute: 67 cells/uL (ref 0–200)
EOS ABS: 201 {cells}/uL (ref 15–500)
Eosinophils Relative: 3 %
HEMATOCRIT: 43.2 % (ref 35.0–45.0)
HEMOGLOBIN: 14.5 g/dL (ref 11.7–15.5)
LYMPHS ABS: 2010 {cells}/uL (ref 850–3900)
Lymphocytes Relative: 30 %
MCH: 31 pg (ref 27.0–33.0)
MCHC: 33.6 g/dL (ref 32.0–36.0)
MCV: 92.5 fL (ref 80.0–100.0)
MONO ABS: 469 {cells}/uL (ref 200–950)
MPV: 11.1 fL (ref 7.5–12.5)
Monocytes Relative: 7 %
NEUTROS ABS: 3953 {cells}/uL (ref 1500–7800)
Neutrophils Relative %: 59 %
Platelets: 269 10*3/uL (ref 140–400)
RBC: 4.67 MIL/uL (ref 3.80–5.10)
RDW: 13.1 % (ref 11.0–15.0)
WBC: 6.7 10*3/uL (ref 3.8–10.8)

## 2016-11-19 LAB — HEPATIC FUNCTION PANEL
ALBUMIN: 4.3 g/dL (ref 3.6–5.1)
ALK PHOS: 48 U/L (ref 33–130)
ALT: 11 U/L (ref 6–29)
AST: 19 U/L (ref 10–35)
BILIRUBIN INDIRECT: 0.5 mg/dL (ref 0.2–1.2)
Bilirubin, Direct: 0.1 mg/dL (ref <=0.2)
TOTAL PROTEIN: 6.3 g/dL (ref 6.1–8.1)
Total Bilirubin: 0.6 mg/dL (ref 0.2–1.2)

## 2016-11-19 LAB — LIPID PANEL
Cholesterol: 221 mg/dL — ABNORMAL HIGH (ref <200)
HDL: 62 mg/dL (ref >50)
LDL Cholesterol: 130 mg/dL — ABNORMAL HIGH (ref <100)
Total CHOL/HDL Ratio: 3.6 Ratio (ref <5.0)
Triglycerides: 144 mg/dL (ref <150)
VLDL: 29 mg/dL (ref <30)

## 2016-11-19 LAB — BASIC METABOLIC PANEL WITH GFR
BUN: 16 mg/dL (ref 7–25)
CHLORIDE: 106 mmol/L (ref 98–110)
CO2: 25 mmol/L (ref 20–31)
Calcium: 9.5 mg/dL (ref 8.6–10.4)
Creat: 1.42 mg/dL — ABNORMAL HIGH (ref 0.60–0.88)
GFR, Est African American: 38 mL/min — ABNORMAL LOW (ref >=60)
GFR, Est Non African American: 33 mL/min — ABNORMAL LOW (ref >=60)
GLUCOSE: 105 mg/dL — AB (ref 65–99)
POTASSIUM: 4.9 mmol/L (ref 3.5–5.3)
Sodium: 144 mmol/L (ref 135–146)

## 2016-11-19 LAB — MAGNESIUM: MAGNESIUM: 1.9 mg/dL (ref 1.5–2.5)

## 2016-11-19 LAB — TSH: TSH: 3.77 m[IU]/L

## 2016-11-19 NOTE — Progress Notes (Signed)
MEDICARE ANNUAL WELLNESS VISIT AND FOLLOW UP  Assessment:   Essential hypertension - continue medications, DASH diet, exercise and monitor at home. Call if greater than 130/80.  - CBC with Differential/Platelet - BASIC METABOLIC PANEL WITH GFR - Hepatic function panel - TSH  Chronic systolic heart failure (HCC) CHF- If any increasing shortness of breath, swelling, or chest pressure go to ER immediately. Decrease your sodium intake to less than 2000 mg daily, decrease your fluid intake to less than 2 L daily, elevate legs, weigh yourself daily, call the office if 5 lbs weight loss OR gain in a day, and please remember to always increase your potassium intake with any increase of your fluid pill.   Dilated cardiomyopathy, nonischemic Continue cardio follow up  ASHD (arteriosclerotic heart disease) Control blood pressure, cholesterol, glucose, increase exercise.   CKD (chronic kidney disease), stage III  avoid NSAIDS, monitor sugars, will monitor   Abnormal glucose Discussed general issues about diabetes pathophysiology and management., Educational material distributed., Suggested low cholesterol diet., Encouraged aerobic exercise., Discussed foot care., Reminded to get yearly retinal exam. - Hemoglobin A1c   Hyperlipidemia -continue medications, check lipids, decrease fatty foods, increase activity.  - Lipid panel  Encounter for Medicare annual wellness exam  Vitamin D deficiency Continue supplement  Medication management - Magnesium  LBBB Control blood pressure, cholesterol, glucose, increase exercise.   Biventricular cardiac pacemaker in situ Control blood pressure, cholesterol, glucose, increase exercise.   Gastroesophageal reflux disease, esophagitis presence not specified Continue PPI/H2 blocker, diet discussed  History of vasodepressor syncope  Encounter for Medicare annual wellness exam Up to date on all  Dizziness Last seconds, nonexertional, no  accompaniments Get on allergy pill Check labs Will write down date/time for pacemaker interrogation Go to the ER if any CP, SOB, nausea, dizziness, severe HA, changes vision/speech   Future Appointments Date Time Provider [place]  11/19/2016 9:30 AM Vicie Mutters, PA-C [facility] None  01/10/2017 9:15 AM Trula Slade, DPM [facility]  02/17/2017 9:00 AM Unk Pinto, MD [facility] None     Plan:   During the course of the visit the patient was educated and counseled about appropriate screening and preventive services including:    Pneumococcal vaccine   Influenza vaccine  Td vaccine  Screening electrocardiogram  Screening mammography  Bone densitometry screening  Colorectal cancer screening  Diabetes screening  Glaucoma screening  Nutrition counseling   Advanced directives: given info/requested   Subjective:   [name] is a 82 y.o. female who presents for Medicare Annual Wellness Visit and 3 month follow up on hypertension, prediabetes, hyperlipidemia, vitamin D def.   Her blood pressure has been controlled at home and at her cardioloist, today their BP is BP: (!) 150/80 She does not workout. She denies chest pain,  dizziness.  She has a complicated heart history of NIDCMP s/p AICD. She sees Dr. Lovena Le has had normal carotid US 2014, last Echo was 2014 showed EF 25%, mild MR, no thrombus. She is on plavix. Her weight is up 2 lbs, she went from AICD to biventricular pacemaker in april she sleeps on a wedge pillow. She complains of constant SOB but this is unchanged. She has had a negative Myoview 2010. She is back on the coreg 2 x a day 6.25mg , losartan 25mg , daily, she is uncertain if she is taking her bumex daily. She states that she will have intermittent dizziness/feeling like she will pass out, non exertional with sitting/riding in car lasts for seconds, no  accompaniments with it, states it has not happened in last 2 months Wt  Readings from Last 3 Encounters:  11/19/16 151 lb 6.4 oz (68.7 kg)  08/06/16 150 lb 3.2 oz (68.1 kg)  08/02/16 149 lb (67.6 kg)   She is on cholesterol medication and denies myalgias. Her cholesterol is at goal. The cholesterol last visit was:   Lab Results  Component Value Date   CHOL 164 08/06/2016   HDL 61 08/06/2016   LDLCALC 76 08/06/2016   TRIG 136 08/06/2016   CHOLHDL 2.7 08/06/2016   She has been working on diet and exercise for prediabetes, and denies polydipsia and polyuria. She is on gabapentin for painful neuropathy, no falls, will take at 8 and 10 pm.  Last A1C in the office was:  Lab Results  Component Value Date   HGBA1C 5.7 (H) 08/06/2016   Patient is on Vitamin D supplement. Lab Results  Component Value Date   VD25OH 62 08/06/2016      Names of Other Physician/Practitioners you currently use: 1. [facility]- here for primary care 2. Dr. Gwynn Burly II, eye doctor, last visit 08/2016 3. Dr. Andree Elk dentist, last visit 3 months ago Patient Care Team: Unk Pinto, MD as PCP - General (Internal Medicine) Unk Pinto, MD (Internal Medicine) Jacolyn Reedy, MD as Consulting Physician (Cardiology) Wallene Huh, DPM as Consulting Physician (Podiatry) Larey Seat, MD as Consulting Physician (Neurology) G. Eston Mould II (Ophthalmology) Magnus Ivan. Andree Elk, DDS  Medication Review Current Outpatient Prescriptions on File Prior to Visit  Medication Sig  . aspirin EC 81 MG tablet Take 81 mg by mouth daily.  Marland Kitchen atorvastatin (LIPITOR) 80 MG tablet TAKE 1 TABLET EVERY DAY  . b complex vitamins capsule Take 1 capsule by mouth daily.   . bumetanide (BUMEX) 1 MG tablet Take 0.5 mg by mouth daily.  . carvedilol (COREG) 6.25 MG tablet Take 6.25 mg by mouth 2 (two) times daily with a meal.  . Cholecalciferol (VITAMIN D) 2000 units CAPS Take 1 capsule by mouth 2 (two) times daily.  . clopidogrel (PLAVIX) 75 MG tablet TAKE 1  TABLET BY MOUTH EVERY DAY TO PREVENT HEART ATTACK  . ESTRACE VAGINAL 0.1 MG/GM vaginal cream   . FREESTYLE LITE test strip TEST GLUCOSE ONCE DAILY AS DIRECTED  . gabapentin (NEURONTIN) 600 MG tablet TAKE 1 TABLET BY MOUTH AT BEDTIME FOR NEUROPATHY  . losartan (COZAAR) 50 MG tablet Take 25 mg by mouth daily.   . magnesium gluconate (MAGONATE) 500 MG tablet Take 500 mg by mouth daily.   . ranitidine (ZANTAC) 300 MG tablet TAKE 1 TABLET TWICE A DAY FOR REFLUX   No current facility-administered medications on file prior to visit.     Current Problems (verified) Patient Active Problem List   Diagnosis Date Noted  . Encounter for Medicare annual wellness exam 10/03/2015  . ASHD (arteriosclerotic heart disease) 12/05/2014  . GERD (gastroesophageal reflux disease) 12/05/2014  . Prediabetes 05/21/2014  . Vitamin D deficiency 05/20/2014  . Medication management 05/20/2014  . CKD (chronic kidney disease), stage III 06/21/2013  . History of vasodepressor syncope   . Biventricular cardiac pacemaker in situ   . Hyperlipidemia   . Hypertension   . LBBB   . Chronic systolic heart failure ([place])   . Dilated cardiomyopathy, nonischemic     Screening Tests Immunization History  Administered Date(s) Administered  . DT 10/03/2015  . Influenza, High Dose Seasonal PF 10/19/2013, 08/06/2016  . Influenza-Unspecified 10/08/2012,  02/03/2015  . Pneumococcal Conjugate-13 08/31/2014  . Pneumococcal-Unspecified 12/09/2002  . Td 12/09/2002  . Zoster 08/06/2016    Preventative care: Last colonoscopy: 2012 Last mammogram: Dr. Marvel Plan- 2017 Last pap smear/pelvic exam: remote, declines DEXA:long time ago, declines another CT head 06/2013 CXR 05/2014 Echo 06/2013 Carotid US 06/2013  Prior vaccinations: TD or Tdap: 2016 Influenza: 2017 Pneumococcal: 2004 Prevnar 13: 2015 Shingles/Zostavax: 2017  Allergies Allergies  Allergen Reactions  . Nexium [Esomeprazole Magnesium] Diarrhea     SURGICAL HISTORY She  has a past surgical history that includes Cardiac defibrillator placement (09/2004); Cardiac catheterization; Appendectomy; Abdominal hysterectomy; Biv pacemaker generator change out (05/05/2013); Carpal tunnel release; Cholecystectomy; Breast surgery (Left, 1985); and bi-ventricular pacemaker upgrade (N/A, 05/05/2013). FAMILY HISTORY Her family history includes Cancer in her maternal grandmother and sister; Dementia in her mother; Diabetes in her daughter, mother, and sister; Heart Problems in her father and sister; Heart attack in her brother and son; Heart disease in her brother and son; Hyperlipidemia in her daughter, sister, and sister; Hypertension in her daughter, sister, and sister; Stroke in her daughter. SOCIAL HISTORY She  reports that she is a non-smoker but has been exposed to tobacco smoke. She has never used smokeless tobacco. She reports that she does not drink alcohol or use drugs.  MEDICARE WELLNESS OBJECTIVES: Cardiac risk factors: Cardiac Risk Factors include: advanced age (>27men, >25 women);dyslipidemia;hypertension;sedentary lifestyle Depression/mood screen:   Depression screen [facility] 2/9 11/19/2016  Decreased Interest 0  Down, Depressed, Hopeless 0  PHQ - 2 Score 0    ADLs:  In your present state of health, do you have any difficulty performing the following activities: 11/19/2016 08/06/2016  Hearing? N N  Vision? N N  Difficulty concentrating or making decisions? Y N  Walking or climbing stairs? Y N  Dressing or bathing? N N  Doing errands, shopping? N N  Preparing Food and eating ? - -  Using the Toilet? - -  In the past six months, have you accidently leaked urine? - -  Do you have problems with loss of bowel control? - -  Managing your Medications? - -  Managing your Finances? - -  Housekeeping or managing your Housekeeping? - -  Some recent data might be hidden    Cognitive Testing  Alert? Yes  Normal Appearance?Yes  Oriented to  person? Yes  Place? Yes   Time? Yes  Recall of three objects?  2/3  Can perform simple calculations? Yes  Displays appropriate judgment?Yes  Can read the correct time from a watch face?Yes  EOL planning: Does Patient Have a Medical Advance Directive?: Yes Type of Advance Directive: Living will, Healthcare Power of [place] in Chart?: No - copy requested   Objective:   Blood pressure (!) 150/80, pulse 78, temperature 97.4 F (36.3 C), resp. rate 14, height 5\' 3"  (1.6 m), weight 151 lb 6.4 oz (68.7 kg), SpO2 96 %. Body mass index is 26.82 kg/m.  General appearance: alert, no distress, WD/WN,  female HEENT: normocephalic, sclerae anicteric, bilateral with cerumen impaction but cleaned out in the office, TMs pearly, nares patent, no discharge or erythema, pharynx normal Oral cavity: MMM, no lesions Neck: supple, no lymphadenopathy, no thyromegaly, no masses Heart: RRR, normal S1, S2, no murmurs, pacemaker left upper chest Lungs: CTA bilaterally, no wheezes, rhonchi, or rales Abdomen: +bs, soft, epigastric tenderness, non distended, no masses, no hepatomegaly, no splenomegaly Musculoskeletal: nontender, no swelling, no obvious deformity Extremities: no edema, no cyanosis, no  clubbing Pulses: 2+ symmetric, upper and lower extremities, normal cap refill Neurological: alert, oriented x 3, CN2-12 intact, strength normal upper extremities and lower extremities, sensation decrease to mid shin bilateral feet, DTRs 2+ throughout, no cerebellar signs, gait normal Psychiatric: normal affect, behavior normal, pleasant  Breast: defer Gyn: defer Rectal: defer  Medicare Attestation I have personally reviewed: The patient's medical and social history Their use of alcohol, tobacco or illicit drugs Their current medications and supplements The patient's functional ability including ADLs,fall risks, home safety risks, cognitive, and hearing and visual  impairment Diet and physical activities Evidence for depression or mood disorders  The patient's weight, height, BMI, and visual acuity have been recorded in the chart.  I have made referrals, counseling, and provided education to the patient based on review of the above and I have provided the patient with a written personalized care plan for preventive services.     Vicie Mutters, PA-C   11/19/2016

## 2016-11-19 NOTE — Patient Instructions (Addendum)
BRING ALL OF YOUR MEDICATIONS WITH YOU NEXT OFFICE VISIT.   If you get dizzy, check your blood pressure and heart rate, write it down with the time and date and give that to your cardiologist next time they interrogate your pacemaker.  Any worsening shortness of breath, dizziness, chest pain, weakness, changes in vision/speech go to the ER.    Dizziness Dizziness is a common problem. It is a feeling of unsteadiness or light-headedness. You may feel like you are about to faint. Dizziness can lead to injury if you stumble or fall. Anyone can become dizzy, but dizziness is more common in older adults. This condition can be caused by a number of things, including medicines, dehydration, or illness. Follow these instructions at home: Taking these steps may help with your condition: Eating and drinking  Drink enough fluid to keep your urine clear or pale yellow. This helps to keep you from becoming dehydrated. Try to drink more clear fluids, such as water.  Do not drink alcohol.  Limit your caffeine intake if directed by your health care provider.  Limit your salt intake if directed by your health care provider. Activity  Avoid making quick movements.  Rise slowly from chairs and steady yourself until you feel okay.  In the morning, first sit up on the side of the bed. When you feel okay, stand slowly while you hold onto something until you know that your balance is fine.  Move your legs often if you need to stand in one place for a long time. Tighten and relax your muscles in your legs while you are standing.  Do not drive or operate heavy machinery if you feel dizzy.  Avoid bending down if you feel dizzy. Place items in your home so that they are easy for you to reach without leaning over. Lifestyle  Do not use any tobacco products, including cigarettes, chewing tobacco, or electronic cigarettes. If you need help quitting, ask your health care provider.  Try to reduce your stress  level, such as with yoga or meditation. Talk with your health care provider if you need help. General instructions  Watch your dizziness for any changes.  Take medicines only as directed by your health care provider. Talk with your health care provider if you think that your dizziness is caused by a medicine that you are taking.  Tell a friend or a family member that you are feeling dizzy. If he or she notices any changes in your behavior, have this person call your health care provider.  Keep all follow-up visits as directed by your health care provider. This is important. Contact a health care provider if:  Your dizziness does not go away.  Your dizziness or light-headedness gets worse.  You feel nauseous.  You have reduced hearing.  You have new symptoms.  You are unsteady on your feet or you feel like the room is spinning. Get help right away if:  You vomit or have diarrhea and are unable to eat or drink anything.  You have problems talking, walking, swallowing, or using your arms, hands, or legs.  You feel generally weak.  You are not thinking clearly or you have trouble forming sentences. It may take a friend or family member to notice this.  You have chest pain, abdominal pain, shortness of breath, or sweating.  Your vision changes.  You notice any bleeding.  You have a headache.  You have neck pain or a stiff neck.  You have a fever. This  information is not intended to replace advice given to you by your health care provider. Make sure you discuss any questions you have with your health care provider. Document Released: 05/21/2001 Document Revised: 05/02/2016 Document Reviewed: 11/21/2014 Elsevier Interactive Patient Education  2017 [employer].

## 2016-11-20 LAB — HEMOGLOBIN A1C
HEMOGLOBIN A1C: 5.6 % (ref <5.7)
MEAN PLASMA GLUCOSE: 114 mg/dL

## 2016-12-02 ENCOUNTER — Other Ambulatory Visit: Payer: Self-pay | Admitting: Internal Medicine

## 2017-01-06 DIAGNOSIS — I5022 Chronic systolic (congestive) heart failure: Secondary | ICD-10-CM | POA: Diagnosis not present

## 2017-01-06 DIAGNOSIS — K219 Gastro-esophageal reflux disease without esophagitis: Secondary | ICD-10-CM | POA: Diagnosis not present

## 2017-01-06 DIAGNOSIS — Z950 Presence of cardiac pacemaker: Secondary | ICD-10-CM | POA: Diagnosis not present

## 2017-01-06 DIAGNOSIS — I951 Orthostatic hypotension: Secondary | ICD-10-CM | POA: Diagnosis not present

## 2017-01-06 DIAGNOSIS — I420 Dilated cardiomyopathy: Secondary | ICD-10-CM | POA: Diagnosis not present

## 2017-01-06 DIAGNOSIS — Z8673 Personal history of transient ischemic attack (TIA), and cerebral infarction without residual deficits: Secondary | ICD-10-CM | POA: Diagnosis not present

## 2017-01-06 DIAGNOSIS — E785 Hyperlipidemia, unspecified: Secondary | ICD-10-CM | POA: Diagnosis not present

## 2017-01-06 DIAGNOSIS — I10 Essential (primary) hypertension: Secondary | ICD-10-CM | POA: Diagnosis not present

## 2017-01-06 DIAGNOSIS — I6529 Occlusion and stenosis of unspecified carotid artery: Secondary | ICD-10-CM | POA: Diagnosis not present

## 2017-01-06 DIAGNOSIS — R0609 Other forms of dyspnea: Secondary | ICD-10-CM | POA: Diagnosis not present

## 2017-01-10 ENCOUNTER — Encounter: Payer: Self-pay | Admitting: Podiatry

## 2017-01-10 ENCOUNTER — Ambulatory Visit (INDEPENDENT_AMBULATORY_CARE_PROVIDER_SITE_OTHER): Payer: Medicare Other | Admitting: Podiatry

## 2017-01-10 DIAGNOSIS — M79676 Pain in unspecified toe(s): Secondary | ICD-10-CM

## 2017-01-10 DIAGNOSIS — B351 Tinea unguium: Secondary | ICD-10-CM

## 2017-01-10 NOTE — Progress Notes (Signed)
Subjective: 83 y.o. returns the office today for painful, elongated, thickened toenails which she cannot trim herself. Denies any redness or drainage around the nails. Denies any acute changes since last appointment and no new complaints today. Denies any systemic complaints such as fevers, chills, nausea, vomiting.   Objective: AAO 3, NAD DP/PT pulses palpable, CRT less than 3 seconds Nails hypertrophic, dystrophic, elongated, brittle, discolored 10. There is tenderness overlying the nails 1-5 bilaterally. There is no surrounding erythema or drainage along the nail sites. No open lesions or pre-ulcerative lesions are identified. No other areas of tenderness bilateral lower extremities. No overlying edema, erythema, increased warmth. No pain with calf compression, swelling, warmth, erythema.  Assessment: Patient presents with symptomatic onychomycosis  Plan: -Treatment options including alternatives, risks, complications were discussed -Nails sharply debrided 10 without complication/bleeding.  -Discussed daily foot inspection. If there are any changes, to call the office immediately.  -Follow-up in 3 months or sooner if any problems are to arise. In the meantime, encouraged to call the office with any questions, concerns, changes symptoms.  Jeanette Rauth, DPM  

## 2017-01-20 ENCOUNTER — Other Ambulatory Visit: Payer: Self-pay | Admitting: *Deleted

## 2017-01-20 MED ORDER — FREESTYLE LANCETS MISC: 2 refills | Status: DC

## 2017-02-06 ENCOUNTER — Encounter: Payer: Self-pay | Admitting: Internal Medicine

## 2017-02-16 NOTE — Progress Notes (Signed)
This very nice 83 y.o. WWF presents for a  comprehensive evaluation and management of multiple medical co-morbidities.  Patient has been followed for HTN, ASHD, Prediabetes, Hyperlipidemia and Vitamin D Deficiency.      HTN predates since 68.  In 2004, patient had a PPM implanted and then replaced in 2008 with a AICD/defibrillator.  Patient had a negative Stress Myoview in 2010.  Patient's BP has been controlled at home and patient denies any cardiac symptoms as chest pain, palpitations, shortness of breath, dizziness or ankle swelling. Today's BP is elevated at 166/72. Patient also has CKD3 (GFR 33 ml/min) attributed to Hypertensive Nephrosclerosis.      Patient's hyperlipidemia is controlled with diet and medications. Patient denies myalgias or other medication SE's. Last lipids were not at goal and treatment deferred for age of 83 yo: Lab Results  Component Value Date   CHOL 221 (H) 11/19/2016   HDL 62 11/19/2016   LDLCALC 130 (H) 11/19/2016   TRIG 144 11/19/2016   CHOLHDL 3.6 11/19/2016      Patient has prediabetes (A1c 6.0% in 2012) and patient denies reactive hypoglycemic symptoms, visual blurring, diabetic polys, or paresthesias. Last A1c was at goal: Lab Results  Component Value Date   HGBA1C 5.6 11/19/2016      Finally, patient has history of Vitamin D Deficiency ("33" in 2008) and last Vitamin D was at goal: Lab Results  Component Value Date   VD25OH 62 08/06/2016   Current Outpatient Prescriptions on File Prior to Visit  Medication Sig  . aspirin EC 81 MG Take 81 mg by mouth daily.  Marland Kitchen atorvastatin  80 MG tablet TAKE 1 TABLET EVERY DAY  . b complex vitamins c Take 1 capsule by mouth daily.   . bumetanide1 MG tablet Take 0.5 mg by mouth daily.  . carvedilol  6.25 MG tablet Take 6.25 mg by mouth 2 (two) times daily with a meal.  . VITAMIN D 2000 units  Take 1 capsule by mouth 2 (two) times daily.  . clopidogrel75 MG TAKE 1 TABLET BY MOUTH EVERY DAY TO PREVENT HEART  ATTACK  . ESTRACE  vag crm   . gabapentin 600 MG  TAKE 1 TABLET BY MOUTH AT BEDTIME FOR NEUROPATHY  . losartan  50 MG Take 25 mg by mouth daily.   . magnesium  500 MG  Take 500 mg by mouth daily.   . ranitidine  300 MG TAKE 1 TABLET TWICE A DAY FOR REFLUX   Allergies  Allergen Reactions  . Nexium [Esomeprazole Magnesium] Diarrhea   Past Medical History:  Diagnosis Date  . A-fib ([place])   . Biventricular cardiac pacemaker in situ    05/06/13  Old AICD explanted and 6949 lead capped  RV lead  Medtronic [id number]  LV lead  Medtronic [id number]  [id number] V Medtronic biventricular pacer  PVX [id number]   . Diabetes mellitus without complication ([place])   . GERD (gastroesophageal reflux disease)   . History of duodenal ulcer   . History of esophageal stricture   . Hyperlipidemia   . Hypertension   . Occlusion and stenosis of carotid artery without mention of cerebral infarction   . Peripheral neuropathy ([place])   . Right groin pain 12/12/2014  . Secondary cardiomyopathy, unspecified   . Transient global amnesia    Health Maintenance  Topic Date Due  . DEXA SCAN  05/22/2017 (Originally 09/24/1992)  . TETANUS/TDAP  10/02/2025  . INFLUENZA VACCINE  Completed  .  PNA vac Low Risk Adult  Completed   Immunization History  Administered Date(s) Administered  . DT 10/03/2015  . Influenza, High Dose Seasonal PF 10/19/2013, 08/06/2016  . Influenza-Unspecified 10/08/2012, 02/03/2015  . Pneumococcal Conjugate-13 08/31/2014  . Pneumococcal-Unspecified 12/09/2002  . Td 12/09/2002  . Zoster 08/06/2016   Past Surgical History:  Procedure Laterality Date  . ABDOMINAL HYSTERECTOMY    . APPENDECTOMY    . BI-VENTRICULAR PACEMAKER UPGRADE N/A 05/05/2013   Procedure: BI-VENTRICULAR PACEMAKER UPGRADE;  Surgeon: Evans Lance, MD;    . BIV PACEMAKER GENERATOR CHANGE OUT  05/05/2013   MDT CRTP upgrade (previously implanted dual chamber ICD) by Dr Lovena Le.  New RV/LV leads placed.   Marland Kitchen BREAST SURGERY Left 1985     Negative  Biopsy  . CARDIAC CATHETERIZATION     04/2003  . CARDIAC DEFIBRILLATOR PLACEMENT  09/2004   MDT ICD implanted for primary prevention; device upgrade to CRTP in 04-2013  . CARPAL TUNNEL RELEASE    . CHOLECYSTECTOMY     Family History  Problem Relation Age of Onset  . Dementia Mother   . Diabetes Mother   . Heart Problems Father   . Heart attack Son   . Heart disease Son   . Heart disease Brother   . Heart attack Brother   . Cancer Maternal Grandmother     breast  . Stroke Daughter   . Hypertension Daughter   . Hyperlipidemia Daughter   . Diabetes Daughter   . Diabetes Sister   . Hypertension Sister   . Hyperlipidemia Sister   . Hypertension Sister   . Hyperlipidemia Sister   . Cancer Sister     Breast  . Heart Problems Sister    Social History  Substance Use Topics  . Smoking status: Passive Smoke Exposure - Never Smoker  . Smokeless tobacco: Never Used     Comment: worked at [employer]  . Alcohol use No    ROS Constitutional: Denies fever, chills, weight loss/gain, headaches, insomnia,  night sweats, and change in appetite. Does c/o fatigue. Eyes: Denies redness, blurred vision, diplopia, discharge, itchy, watery eyes.  ENT: Denies discharge, congestion, post nasal drip, epistaxis, sore throat, earache, hearing loss, dental pain, Tinnitus, Vertigo, Sinus pain, snoring.  Cardio: Denies chest pain, palpitations, irregular heartbeat, syncope, dyspnea, diaphoresis, orthopnea, PND, claudication, edema Respiratory: denies cough, dyspnea, DOE, pleurisy, hoarseness, laryngitis, wheezing.  Gastrointestinal: Denies dysphagia, heartburn, reflux, water brash, pain, cramps, nausea, vomiting, bloating, diarrhea, constipation, hematemesis, melena, hematochezia, jaundice, hemorrhoids Genitourinary: Denies dysuria, frequency, urgency, nocturia, hesitancy, discharge, hematuria, flank pain Breast: Breast lumps, nipple discharge, bleeding.  Musculoskeletal: Denies arthralgia,  myalgia, stiffness, Jt. Swelling, pain, limp, and strain/sprain. Denies falls. Skin: Denies puritis, rash, hives, warts, acne, eczema, changing in skin lesion Neuro: No weakness, tremor, incoordination, spasms, paresthesia, pain Psychiatric: Denies confusion, memory loss, sensory loss. Denies Depression. Endocrine: Denies change in weight, skin, hair change, nocturia, and paresthesia, diabetic polys, visual blurring, hyper / hypo glycemic episodes.  Heme/Lymph: No excessive bleeding, bruising, enlarged lymph nodes.  Physical Exam  BP 166/72   P 76   T 97.5 F  R 16   Ht 5\' 3"     Wt 149 lb    BMI 26.39   General Appearance: Well nourished and in no apparent distress.  Eyes: PERRLA, EOMs, conjunctiva no swelling or erythema, normal fundi and vessels. Sinuses: No frontal/maxillary tenderness ENT/Mouth: EACs patent / TMs  nl. Nares clear without erythema, swelling, mucoid exudates. Oral hygiene is good. No erythema,  swelling, or exudate. Tongue normal, non-obstructing. Tonsils not swollen or erythematous. Hearing normal.  Neck: Supple, thyroid normal. No bruits, nodes or JVD. Respiratory: Respiratory effort normal.  BS equal and clear bilateral without rales, rhonci, wheezing or stridor. Cardio: Heart sounds are normal with regular rate and rhythm and no murmurs, rubs or gallops. Peripheral pulses are normal and equal bilaterally without edema. No aortic or femoral bruits. Chest: symmetric with normal excursions and percussion. Breasts: Symmetric, without lumps, nipple discharge, retractions, or fibrocystic changes.  Abdomen: Flat, soft with bowel sounds active. Nontender, no guarding, rebound, hernias, masses, or organomegaly.  Lymphatics: Non tender without lymphadenopathy.  Genitourinary:  Musculoskeletal: Full ROM all peripheral extremities, joint stability, 5/5 strength, and normal gait. Skin: Warm and dry without rashes, lesions, cyanosis, clubbing or  ecchymosis.  Neuro: Cranial  nerves intact, reflexes equal bilaterally. Normal muscle tone, no cerebellar symptoms. Sensation intact.  Pysch: Alert and oriented X 3, normal affect, Insight and Judgment appropriate.   Assessment and Plan  1. Essential hypertension  - Microalbumin / creatinine urine ratio - EKG 12-Lead - Urinalysis, Routine w reflex microscopic - CBC with Differential/Platelet - BASIC METABOLIC PANEL WITH GFR - Magnesium - TSH  2. Mixed hyperlipidemia  - EKG 12-Lead - Hepatic function panel - TSH  3. Prediabetes  - EKG 12-Lead - Hemoglobin A1c - Insulin, random  4. Vitamin D deficiency  - VITAMIN D 25 Hydroxy   5. ASHD (arteriosclerotic heart disease)  - EKG 12-Lead  6. CKD (chronic kidney disease), stage III   7. Screening for rectal cancer  - POC Hemoccult Bld/Stl   8. Screening for ischemic heart disease  - EKG 12-Lead  9. Medication management  - Urinalysis, Routine w reflex microscopic - CBC with Differential/Platelet - BASIC METABOLIC PANEL WITH GFR - Hepatic function panel - Magnesium - TSH - Hemoglobin A1c - Insulin, random - VITAMIN D 25 Hydroxy   10. Biventricular cardiac pacemaker in situ       Continue prudent diet as discussed, weight control, BP monitoring, regular exercise, and medications. Discussed med's effects and SE's. Screening labs and tests as requested with regular follow-up as recommended. Over 40 minutes of exam, counseling, chart review and high complex critical decision making was performed.

## 2017-02-16 NOTE — Patient Instructions (Signed)

## 2017-02-17 ENCOUNTER — Other Ambulatory Visit: Payer: Self-pay | Admitting: Internal Medicine

## 2017-02-17 ENCOUNTER — Encounter: Payer: Self-pay | Admitting: Internal Medicine

## 2017-02-17 ENCOUNTER — Ambulatory Visit (INDEPENDENT_AMBULATORY_CARE_PROVIDER_SITE_OTHER): Payer: Medicare Other | Admitting: Internal Medicine

## 2017-02-17 VITALS — BP 166/72 | HR 76 | Temp 97.5°F | Resp 16 | Ht 63.0 in | Wt 149.0 lb

## 2017-02-17 DIAGNOSIS — I2510 Atherosclerotic heart disease of native coronary artery without angina pectoris: Secondary | ICD-10-CM | POA: Diagnosis not present

## 2017-02-17 DIAGNOSIS — Z950 Presence of cardiac pacemaker: Secondary | ICD-10-CM

## 2017-02-17 DIAGNOSIS — Z79899 Other long term (current) drug therapy: Secondary | ICD-10-CM

## 2017-02-17 DIAGNOSIS — I10 Essential (primary) hypertension: Secondary | ICD-10-CM

## 2017-02-17 DIAGNOSIS — E782 Mixed hyperlipidemia: Secondary | ICD-10-CM

## 2017-02-17 DIAGNOSIS — N183 Chronic kidney disease, stage 3 unspecified: Secondary | ICD-10-CM

## 2017-02-17 DIAGNOSIS — Z136 Encounter for screening for cardiovascular disorders: Secondary | ICD-10-CM

## 2017-02-17 DIAGNOSIS — R7303 Prediabetes: Secondary | ICD-10-CM

## 2017-02-17 DIAGNOSIS — Z1212 Encounter for screening for malignant neoplasm of rectum: Secondary | ICD-10-CM

## 2017-02-17 DIAGNOSIS — E559 Vitamin D deficiency, unspecified: Secondary | ICD-10-CM

## 2017-02-17 DIAGNOSIS — N390 Urinary tract infection, site not specified: Secondary | ICD-10-CM | POA: Diagnosis not present

## 2017-02-17 LAB — CBC WITH DIFFERENTIAL/PLATELET
BASOS PCT: 1 %
Basophils Absolute: 86 cells/uL (ref 0–200)
EOS ABS: 258 {cells}/uL (ref 15–500)
Eosinophils Relative: 3 %
HEMATOCRIT: 41.9 % (ref 35.0–45.0)
HEMOGLOBIN: 13.9 g/dL (ref 11.7–15.5)
Lymphocytes Relative: 21 %
Lymphs Abs: 1806 cells/uL (ref 850–3900)
MCH: 30.2 pg (ref 27.0–33.0)
MCHC: 33.2 g/dL (ref 32.0–36.0)
MCV: 90.9 fL (ref 80.0–100.0)
MONO ABS: 688 {cells}/uL (ref 200–950)
MPV: 11.3 fL (ref 7.5–12.5)
Monocytes Relative: 8 %
Neutro Abs: 5762 cells/uL (ref 1500–7800)
Neutrophils Relative %: 67 %
Platelets: 284 10*3/uL (ref 140–400)
RBC: 4.61 MIL/uL (ref 3.80–5.10)
RDW: 13.2 % (ref 11.0–15.0)
WBC: 8.6 10*3/uL (ref 3.8–10.8)

## 2017-02-17 LAB — TSH: TSH: 2.74 mIU/L

## 2017-02-18 LAB — BASIC METABOLIC PANEL WITH GFR
BUN: 15 mg/dL (ref 7–25)
CO2: 24 mmol/L (ref 20–31)
CREATININE: 1.23 mg/dL — AB (ref 0.60–0.88)
Calcium: 9.2 mg/dL (ref 8.6–10.4)
Chloride: 106 mmol/L (ref 98–110)
GFR, EST AFRICAN AMERICAN: 45 mL/min — AB (ref >=60)
GFR, Est Non African American: 39 mL/min — ABNORMAL LOW (ref >=60)
Glucose, Bld: 122 mg/dL — ABNORMAL HIGH (ref 65–99)
POTASSIUM: 4.4 mmol/L (ref 3.5–5.3)
Sodium: 143 mmol/L (ref 135–146)

## 2017-02-18 LAB — URINALYSIS, MICROSCOPIC ONLY
Casts: NONE SEEN [LPF]
Crystals: NONE SEEN [HPF]
WBC, UA: >60 WBC/HPF — AB (ref <=5)
YEAST: NONE SEEN [HPF]

## 2017-02-18 LAB — URINALYSIS, ROUTINE W REFLEX MICROSCOPIC
BILIRUBIN URINE: NEGATIVE
Glucose, UA: NEGATIVE
Hgb urine dipstick: NEGATIVE
KETONES UR: NEGATIVE
NITRITE: POSITIVE — AB
PH: 6 (ref 5.0–8.0)
SPECIFIC GRAVITY, URINE: 1.019 (ref 1.001–1.035)

## 2017-02-18 LAB — HEPATIC FUNCTION PANEL
ALK PHOS: 50 U/L (ref 33–130)
ALT: 10 U/L (ref 6–29)
AST: 17 U/L (ref 10–35)
Albumin: 4.1 g/dL (ref 3.6–5.1)
BILIRUBIN DIRECT: 0.1 mg/dL (ref <=0.2)
Indirect Bilirubin: 0.5 mg/dL (ref 0.2–1.2)
Total Bilirubin: 0.6 mg/dL (ref 0.2–1.2)
Total Protein: 6.3 g/dL (ref 6.1–8.1)

## 2017-02-18 LAB — INSULIN, RANDOM: INSULIN: 10.3 u[IU]/mL (ref 2.0–19.6)

## 2017-02-18 LAB — MICROALBUMIN / CREATININE URINE RATIO
CREATININE, URINE: 251 mg/dL (ref 20–320)
Microalb Creat Ratio: 22 mcg/mg creat (ref <30)
Microalb, Ur: 5.6 mg/dL

## 2017-02-18 LAB — VITAMIN D 25 HYDROXY (VIT D DEFICIENCY, FRACTURES): Vit D, 25-Hydroxy: 67 ng/mL (ref 30–100)

## 2017-02-18 LAB — MAGNESIUM: MAGNESIUM: 1.8 mg/dL (ref 1.5–2.5)

## 2017-02-18 LAB — HEMOGLOBIN A1C
HEMOGLOBIN A1C: 5.6 % (ref <5.7)
Mean Plasma Glucose: 114 mg/dL

## 2017-02-20 ENCOUNTER — Other Ambulatory Visit: Payer: Self-pay | Admitting: Internal Medicine

## 2017-02-20 DIAGNOSIS — N3090 Cystitis, unspecified without hematuria: Secondary | ICD-10-CM

## 2017-02-20 LAB — URINE CULTURE

## 2017-02-20 MED ORDER — CIPROFLOXACIN HCL 250 MG PO TABS: ORAL_TABLET | ORAL | 0 refills | Status: AC

## 2017-04-04 DIAGNOSIS — I5022 Chronic systolic (congestive) heart failure: Secondary | ICD-10-CM | POA: Diagnosis not present

## 2017-04-04 DIAGNOSIS — I420 Dilated cardiomyopathy: Secondary | ICD-10-CM | POA: Diagnosis not present

## 2017-04-04 DIAGNOSIS — E785 Hyperlipidemia, unspecified: Secondary | ICD-10-CM | POA: Diagnosis not present

## 2017-04-04 DIAGNOSIS — Z950 Presence of cardiac pacemaker: Secondary | ICD-10-CM | POA: Diagnosis not present

## 2017-04-04 DIAGNOSIS — I951 Orthostatic hypotension: Secondary | ICD-10-CM | POA: Diagnosis not present

## 2017-04-04 DIAGNOSIS — Z8673 Personal history of transient ischemic attack (TIA), and cerebral infarction without residual deficits: Secondary | ICD-10-CM | POA: Diagnosis not present

## 2017-04-04 DIAGNOSIS — I6529 Occlusion and stenosis of unspecified carotid artery: Secondary | ICD-10-CM | POA: Diagnosis not present

## 2017-04-04 DIAGNOSIS — I10 Essential (primary) hypertension: Secondary | ICD-10-CM | POA: Diagnosis not present

## 2017-04-04 DIAGNOSIS — K219 Gastro-esophageal reflux disease without esophagitis: Secondary | ICD-10-CM | POA: Diagnosis not present

## 2017-04-04 DIAGNOSIS — R0609 Other forms of dyspnea: Secondary | ICD-10-CM | POA: Diagnosis not present

## 2017-04-11 ENCOUNTER — Ambulatory Visit: Payer: Medicare Other | Admitting: Podiatry

## 2017-04-19 ENCOUNTER — Other Ambulatory Visit: Payer: Self-pay | Admitting: Internal Medicine

## 2017-04-28 ENCOUNTER — Encounter: Payer: Self-pay | Admitting: Podiatry

## 2017-04-28 ENCOUNTER — Ambulatory Visit (INDEPENDENT_AMBULATORY_CARE_PROVIDER_SITE_OTHER): Payer: Medicare Other | Admitting: Podiatry

## 2017-04-28 DIAGNOSIS — M79676 Pain in unspecified toe(s): Secondary | ICD-10-CM | POA: Diagnosis not present

## 2017-04-28 DIAGNOSIS — B351 Tinea unguium: Secondary | ICD-10-CM

## 2017-04-28 NOTE — Patient Instructions (Signed)
Try FUNGI-NAIL on your nails for the fungus. You can get this at the drug store.

## 2017-04-28 NOTE — Progress Notes (Signed)
Subjective: 83 y.o. returns the office today for painful, elongated, thickened toenails which she cannot trim herself. Denies any redness or drainage around the nails. Denies any acute changes since last appointment and no new complaints today. Denies any systemic complaints such as fevers, chills, nausea, vomiting.   Objective: AAO 3, NAD DP/PT pulses palpable, CRT less than 3 seconds Nails hypertrophic, dystrophic, elongated, brittle, discolored 10. There is tenderness overlying the nails 1-5 bilaterally. There is no surrounding erythema or drainage along the nail sites. No open lesions or pre-ulcerative lesions are identified. No other areas of tenderness bilateral lower extremities. No overlying edema, erythema, increased warmth. No pain with calf compression, swelling, warmth, erythema.  Assessment: Patient presents with symptomatic onychomycosis  Plan: -Treatment options including alternatives, risks, complications were discussed -Nails sharply debrided 10 without complication/bleeding. She was asking about treatment for nail fungus. Discussed topical. She will start with fungi-nail.  -Discussed daily foot inspection. If there are any changes, to call the office immediately.  -Follow-up in 3 months or sooner if any problems are to arise. In the meantime, encouraged to call the office with any questions, concerns, changes symptoms.  Celesta Gentile, DPM

## 2017-05-05 ENCOUNTER — Other Ambulatory Visit: Payer: Self-pay | Admitting: Internal Medicine

## 2017-05-08 ENCOUNTER — Other Ambulatory Visit: Payer: Self-pay | Admitting: Internal Medicine

## 2017-05-13 DIAGNOSIS — Z6826 Body mass index (BMI) 26.0-26.9, adult: Secondary | ICD-10-CM | POA: Diagnosis not present

## 2017-05-13 DIAGNOSIS — Z01419 Encounter for gynecological examination (general) (routine) without abnormal findings: Secondary | ICD-10-CM | POA: Diagnosis not present

## 2017-05-14 ENCOUNTER — Ambulatory Visit (INDEPENDENT_AMBULATORY_CARE_PROVIDER_SITE_OTHER): Payer: Medicare Other | Admitting: Physician Assistant

## 2017-05-14 ENCOUNTER — Encounter (INDEPENDENT_AMBULATORY_CARE_PROVIDER_SITE_OTHER): Payer: Self-pay

## 2017-05-14 VITALS — BP 120/74 | HR 60 | Ht 63.0 in | Wt 146.0 lb

## 2017-05-14 DIAGNOSIS — R55 Syncope and collapse: Secondary | ICD-10-CM | POA: Diagnosis not present

## 2017-05-14 DIAGNOSIS — I5022 Chronic systolic (congestive) heart failure: Secondary | ICD-10-CM

## 2017-05-14 DIAGNOSIS — I2510 Atherosclerotic heart disease of native coronary artery without angina pectoris: Secondary | ICD-10-CM

## 2017-05-14 DIAGNOSIS — I10 Essential (primary) hypertension: Secondary | ICD-10-CM | POA: Diagnosis not present

## 2017-05-14 DIAGNOSIS — I420 Dilated cardiomyopathy: Secondary | ICD-10-CM

## 2017-05-14 NOTE — Progress Notes (Signed)
Cardiology Office Note Date:  05/14/2017  Patient ID:  [name] 1924/12/10, MRN [id number] PCP:  Unk Pinto, MD  Cardiologist: Dr. Wynonia Lawman  Electrophysiologist: Dr. Lovena Le   Chief Complaint: syncope  History of Present Illness: [name] is a 83 y.o. female with history of chronic CHF (systolic), CM w/ hx of an ICD that was removed for CRT-P, HTN, HLD, DM.  AFib is listed on her PMHx list but I do not see when this was, I do not see it specifically mentioned by Dr. Lovena Le or Dr. Thurman Coyer available notes.  She comes in today to be seen for Dr. Lovena Le, last seen by him in August 2017 at that time doing well.  She reports 3 weeks ago had been sitting outside talking with 2 of her kids feeling well, had pulled some weeds while there, she stood up and walked back to her shed to throw the grass out (a fairly short distance), she does not recall throwing out the grass but woke back in front of her garage in the driveway with her neighbor arousing her, she asked her to get her daughter from the house, she asked them not to call 911, was helped to the house but was feeling OK and until Friday had done well without symptoms.  She does not recall clearly feeling poorly prior to fainting, but did initially feel very weak and unable to get up without assistance.  The second/last episode was last week Friday, was unloading groceries from the house, had made one trip in with several bags and when she went around the back if the car to get anther load fell/ultimately fainted to her knees.  She again felt weak initially afterwards. She did seem to know she felt weak prior to that episode.  She had not with these episodes or otherwise had any kind of CP, no palpitations, no SOB.  She has not had any dizzy spells or near syncopal episode either.  She does mention a significant increase in personal; stress with the death of her son in 2023/04/29, this in mentioning it today makes her emotional and  admittedly reports she spends hours crying at times.  She has discussed this with her PMD.    She has not had any kind of medication changes or recent illness.   Device information: MDT CRT-P, implanted 05/03/13, RA lead from 2005  Past Medical History:  Diagnosis Date  . A-fib ([place])   . Biventricular cardiac pacemaker in situ    05/06/13  Old AICD explanted and 6949 lead capped  RV lead  Medtronic 5076 [id number]  LV lead  Medtronic 4194  [id number] V Medtronic biventricular pacer  PVX [id number]   . Diabetes mellitus without complication ([place])   . GERD (gastroesophageal reflux disease)   . History of duodenal ulcer   . History of esophageal stricture   . Hyperlipidemia   . Hypertension   . Occlusion and stenosis of carotid artery without mention of cerebral infarction   . Peripheral neuropathy   . Right groin pain 12/12/2014  . Secondary cardiomyopathy, unspecified   . Transient global amnesia     Past Surgical History:  Procedure Laterality Date  . ABDOMINAL HYSTERECTOMY    . APPENDECTOMY    . BI-VENTRICULAR PACEMAKER UPGRADE N/A 05/05/2013   Procedure: BI-VENTRICULAR PACEMAKER UPGRADE;  Surgeon: Evans Lance, MD;  Location[facility] CATH LAB;  Service: Cardiovascular;  Laterality: N/A;  . BIV PACEMAKER GENERATOR CHANGE OUT  05/05/2013   MDT CRTP  upgrade (previously implanted dual chamber ICD) by Dr Lovena Le.  New RV/LV leads placed.   Marland Kitchen BREAST SURGERY Left 1985   Negative  Biopsy  . CARDIAC CATHETERIZATION     04/2003  . CARDIAC DEFIBRILLATOR PLACEMENT  09/2004   MDT ICD implanted for primary prevention; device upgrade to CRTP in 04-2013  . CARPAL TUNNEL RELEASE    . CHOLECYSTECTOMY      Current Outpatient Prescriptions  Medication Sig Dispense Refill  . aspirin EC 81 MG tablet Take 81 mg by mouth daily.    Marland Kitchen b complex vitamins capsule Take 1 capsule by mouth daily.     . bumetanide (BUMEX) 1 MG tablet Take 0.5 mg by mouth daily.  12  . carvedilol (COREG) 6.25 MG tablet Take 6.25 mg  by mouth 2 (two) times daily with a meal.    . Cholecalciferol (VITAMIN D) 2000 units CAPS Take 1 capsule by mouth 2 (two) times daily.    . clopidogrel (PLAVIX) 75 MG tablet TAKE 1 TABLET BY MOUTH EVERY DAY TO PREVENT HEART ATTACK 30 tablet 6  . ESTRACE VAGINAL 0.1 MG/GM vaginal cream     . FREESTYLE LITE test strip TEST GLUCOSE ONCE DAILY AS DIRECTED 100 each 2  . gabapentin (NEURONTIN) 600 MG tablet TAKE 1 TABLET BY MOUTH AT BEDTIME FOR NEUROPATHY 90 tablet 1  . Lancets (FREESTYLE) lancets Check blood sugar 1 time daily-DX-R73.03 100 each 2  . losartan (COZAAR) 50 MG tablet Take 25 mg by mouth daily.     . magnesium gluconate (MAGONATE) 500 MG tablet Take 500 mg by mouth daily.     . ranitidine (ZANTAC) 300 MG tablet TAKE 1 TABLET TWICE A DAY FOR REFLUX 180 tablet 1   No current facility-administered medications for this visit.     Allergies:   Nexium [esomeprazole magnesium]   Social History:  The patient  reports that she is a non-smoker but has been exposed to tobacco smoke. She has never used smokeless tobacco. She reports that she does not drink alcohol or use drugs.   Family History:  The patient's family history includes Cancer in her maternal grandmother and sister; Dementia in her mother; Diabetes in her daughter, mother, and sister; Heart Problems in her father and sister; Heart attack in her brother and son; Heart disease in her brother and son; Hyperlipidemia in her daughter, sister, and sister; Hypertension in her daughter, sister, and sister; Stroke in her daughter.  ROS:  Please see the history of present illness.   All other systems are reviewed and otherwise negative.   PHYSICAL EXAM:  VS:  BP 120/74   Pulse 60   Ht 5\' 3"  (1.6 m)   Wt 146 lb (66.2 kg)   BMI 25.86 kg/m  BMI: Body mass index is 25.86 kg/m. Well nourished, well developed, in no acute distress  HEENT: normocephalic, atraumatic  Neck: no JVD, carotid bruits or masses Cardiac:  RRR; 1/6 SM, no rubs, or  gallops Lungs:  CTA b/l, no wheezing, rhonchi or rales  Abd: soft, nontender MS: no deformity, age appropriate atrophy Ext: no edema  Skin: warm and dry, no rash Neuro:  No gross deficits appreciated Psych: euthymic mood, full affect  PPM site is stable, no tethering or discomfort   EKG:  Done today and reviewed by myself shows AV paced rhythm, unchanged 03/05/17 SR/V paced PPM interrogation is done today and reviewed by myself: battery and lead measurements are good.  She has had only 2 NSVT since  her transmission in April, both 1 second in duration,  One looks NSVT the other an AT, neither correlate with her episodes.  Optivol looks good, well below threshold, though appeared to have a brief crossing 2 weeks ago  06/23/13: Carotid US Summary: No significant extracranial carotid artery stenosis demonstrated. Vertebrals are patent with antegrade flow.  06/22/13: TTE Study Conclusions - Left ventricle: The cavity size was normal. There was moderate concentric hypertrophy. Systolic function was severely reduced. The estimated ejection fraction was in the range of 25% to 30%. Wall motion was normal; there were no regional wall motion abnormalities. - Ventricular septum: Septal motion showed abnormal function and moderate dyssynergy. These changes are consistent with right ventricular pacing. - Aortic valve: Mild regurgitation. Valve area: 1.02cm^2(VTI). Valve area: 1.02cm^2 (Vmax). - Mitral valve: Mild regurgitation. - Left atrium: The atrium was mildly dilated.  Recent Labs: 02/17/2017: ALT 10; BUN 15; Creat 1.23; Hemoglobin 13.9; Magnesium 1.8; Platelets 284; Potassium 4.4; Sodium 143; TSH 2.74  11/19/2016: Cholesterol 221; HDL 62; LDL Cholesterol 130; Total CHOL/HDL Ratio 3.6; Triglycerides 144; VLDL 29   CrCl cannot be calculated (Patient's most recent lab result is older than the maximum 21 days allowed.).   Wt Readings from Last 3 Encounters:  05/14/17 146 lb  (66.2 kg)  02/17/17 149 lb (67.6 kg)  11/19/16 151 lb 6.4 oz (68.7 kg)     Other studies reviewed: Additional studies/records reviewed today include: summarized above  ASSESSMENT AND PLAN:  1. Syncope     Her pacer function is intact, she has not had any arrhythmias or observations by her device that explain her syncope      She is not currently fluid OL     I wonder if during her grieving her PO intake may be down, she admits to probably not eating/drinking as usual, and mentions her daughter was as well concerned about this     While she spoke to Dr. Wynonia Lawman about the death of her son via telephone did not mention to him her fainting.    She did discuss this with her PMD and he had already made her aware of no driving, I re-counsel on this given [place] law, for 6 months.  She is instructed to keep adequate hydration/PO intake and adequate sleep if able, and to follow up with her PMD and Dr. Wynonia Lawman for any additional w/u  2. Chronic CHF (systolic)     Weight is down     On BB/ARB, bumex  3. HTN     Looks good, no changes  Disposition: F/u with 3 month remote check and Dr. Lovena Le in 1 year, sooner if needed.  Current medicines are reviewed at length with the patient today.  The patient did not have any concerns regarding medicines.  Haywood Lasso, PA-C 05/14/2017 8:33 AM     [facility] [address] [place] [address] [phone] (office)  [phone] (fax)

## 2017-05-14 NOTE — Patient Instructions (Addendum)
Medication Instructions:   Your physician recommends that you continue on your current medications as directed. Please refer to the Current Medication list given to you today.   If you need a refill on your cardiac medications before your next appointment, please call your pharmacy.  Labwork: NONE ORDERED  TODAY    Testing/Procedures: NONE ORDERED  TODAY    Follow-Up:  Your physician wants you to follow-up in: ONE YEAR WITH  Dr  Knox Saliva will receive a reminder letter in the mail two months in advance. If you don't receive a letter, please call our office to schedule the follow-up appointment.   Remote monitoring is used to monitor your Pacemaker of ICD from home. This monitoring reduces the number of office visits required to check your device to one time per year. It allows [place] to keep an eye on the functioning of your device to ensure it is working properly. You are scheduled for a device check from home on . 08-14-2017 You may send your transmission at any time that day. If you have a wireless device, the transmission will be sent automatically. After your physician reviews your transmission, you will receive a postcard with your next transmission date.     Any Other Special Instructions Will Be Listed Below (If Applicable).   PLEASE FOLLOW UP WITH DR Wynonia Lawman AND YOUR PRIMARY CARE DOCTOR  ABOUT ANY FURTHER [place]

## 2017-05-15 ENCOUNTER — Telehealth: Payer: Self-pay | Admitting: Internal Medicine

## 2017-05-15 NOTE — Telephone Encounter (Signed)
New message    Pt is calling asking for a call back from RN. She didn't say what it was about.

## 2017-05-15 NOTE — Telephone Encounter (Signed)
Called, spoke with pt. Pt informed she rec'd letter to schedule f/u appt with Dr. Lovena Le. Pt was her yesterday with Caroline More, PA. Device was checked and Jennings Books, PA recommended 3 month f/u with device check and 1 year w/ Dr. Lovena Le. Informed to disregard letter. Pt informed her home transmitter does not appear to be working properly. Pt informed Dr. Thurman Coyer office follows device. Informed pt should contact their office with concerns. Pt verbalized understanding and thanked me for calling.

## 2017-05-19 DIAGNOSIS — Z1231 Encounter for screening mammogram for malignant neoplasm of breast: Secondary | ICD-10-CM | POA: Diagnosis not present

## 2017-05-29 NOTE — Progress Notes (Signed)
Assessment and Plan:   Essential hypertension - continue medications, DASH diet, exercise and monitor at home. Call if greater than 130/80.  -     CBC with Differential/Platelet -     BASIC METABOLIC PANEL WITH GFR -     Hepatic function panel -     TSH  Mixed hyperlipidemia -continue medications, check lipids, decrease fatty foods, increase activity.  -     Lipid panel  Medication management -     Magnesium  Chronic systolic congestive heart failure (HCC) Weight down, monitor  ASHD (arteriosclerotic heart disease) Control blood pressure, cholesterol, glucose, increase exercise.  Can follow up with Dr. Wynonia Lawman  CKD (chronic kidney disease), stage III Increase fluids, long talk with patient -     BASIC METABOLIC PANEL WITH GFR  Syncope, unspecified syncope type Check labs Has seen cardio, told to follow up if needed Continue to not drive x 6 months Pending labs, may need to get EEG due to possible confusion afterwards and missing time frames -     CBC with Differential/Platelet -     BASIC METABOLIC PANEL WITH GFR -     TSH -     Urinalysis, Routine w reflex microscopic -     Iron and TIBC -     Vitamin B12  Urinary frequency -     Urine Culture  Continue diet and meds as discussed. Further disposition pending results of labs.  HPI 83 y.o. female  presents for 3 month follow up with hypertension, hyperlipidemia, prediabetes and vitamin D.   Her blood pressure has been controlled at home, today their BP is BP: 128/68.   She does workout. She denies chest pain, shortness of breath, dizziness. She has a complicated heart history of NIDCMP s/p AICD. She sees Dr. Lovena Le has had normal carotid US 03/17/2013, last Echo was 03-17-13 showed EF 25%, mild MR, no thrombus. She is on plavix. She went from AICD to biventricular pacemaker last Apr 18, 2023.She has had a negative Myoview Mar 17, 2009. Son Shanon Brow died in 2023-04-18, endstage renal disease.   She had two more episodes of passing out since last OV,  she was pulling weeds and through those out, she woke up on her knees in the drive way, she states she does not remember walking from trash to the driveway. She had another episode after watering grass, has episode of not remember from garden hose to another area. Has some confusion/being in a daze for 5-10 mins. No CP, SOB, dizziness before fall. She dug 4-5 holes in her yard on Sunday without an issues.  She has not been on bumex x 1 year unless her legs swell, but has not needed.  Weight is down.  She as seen cardio since this time, did have two runs of NSVT but did not correlate with episodes. Has not been driving.  Wt Readings from Last 3 Encounters:  05/30/17 144 lb 9.6 oz (65.6 kg)  05/14/17 146 lb (66.2 kg)  02/17/17 149 lb (67.6 kg)    She is on cholesterol medication and denies myalgias. Her cholesterol is at goal. The cholesterol last visit was:   Lab Results  Component Value Date   CHOL 221 (H) 11/19/2016   HDL 62 11/19/2016   LDLCALC 130 (H) 11/19/2016   TRIG 144 11/19/2016   CHOLHDL 3.6 11/19/2016     She has been working on diet and exercise for prediabetes, she is on gabapentin for painful neuropathy, and denies foot ulcerations, hyperglycemia, hypoglycemia , increased  appetite, nausea, paresthesia of the feet, polydipsia, polyuria, visual disturbances, vomiting and weight loss. Last A1C in the office was:  Lab Results  Component Value Date   HGBA1C 5.6 02/17/2017    Patient is on Vitamin D supplement.  Lab Results  Component Value Date   VD25OH 67 02/17/2017      Current Medications:  Current Outpatient Prescriptions on File Prior to Visit  Medication Sig Dispense Refill  . aspirin EC 81 MG tablet Take 81 mg by mouth daily.    Marland Kitchen b complex vitamins capsule Take 1 capsule by mouth daily.     . bumetanide (BUMEX) 1 MG tablet Take 0.5 mg by mouth daily.  12  . carvedilol (COREG) 6.25 MG tablet Take 6.25 mg by mouth 2 (two) times daily with a meal.    . Cholecalciferol  (VITAMIN D) 2000 units CAPS Take 1 capsule by mouth 2 (two) times daily.    . clopidogrel (PLAVIX) 75 MG tablet TAKE 1 TABLET BY MOUTH EVERY DAY TO PREVENT HEART ATTACK 30 tablet 6  . ESTRACE VAGINAL 0.1 MG/GM vaginal cream     . FREESTYLE LITE test strip TEST GLUCOSE ONCE DAILY AS DIRECTED 100 each 2  . gabapentin (NEURONTIN) 600 MG tablet TAKE 1 TABLET BY MOUTH AT BEDTIME FOR NEUROPATHY 90 tablet 1  . Lancets (FREESTYLE) lancets Check blood sugar 1 time daily-DX-R73.03 100 each 2  . losartan (COZAAR) 50 MG tablet Take 25 mg by mouth daily.     . magnesium gluconate (MAGONATE) 500 MG tablet Take 500 mg by mouth daily.     . ranitidine (ZANTAC) 300 MG tablet TAKE 1 TABLET TWICE A DAY FOR REFLUX 180 tablet 1   No current facility-administered medications on file prior to visit.     Medical History:  Past Medical History:  Diagnosis Date  . A-fib ([place])   . Biventricular cardiac pacemaker in situ    05/06/13  Old AICD explanted and 6949 lead capped  RV lead  Medtronic 5076 [id number]  LV lead  Medtronic 4194  [id number] V Medtronic biventricular pacer  PVX [id number]   . Diabetes mellitus without complication ([place])   . GERD (gastroesophageal reflux disease)   . History of duodenal ulcer   . History of esophageal stricture   . Hyperlipidemia   . Hypertension   . Occlusion and stenosis of carotid artery without mention of cerebral infarction   . Peripheral neuropathy   . Right groin pain 12/12/2014  . Secondary cardiomyopathy, unspecified   . Transient global amnesia     Allergies:  Allergies  Allergen Reactions  . Nexium [Esomeprazole Magnesium] Diarrhea     Review of Systems:  Review of Systems  Constitutional: Negative for chills, fever and malaise/fatigue.  HENT: Negative for congestion, ear pain and sore throat.   Eyes: Negative.   Respiratory: Negative for cough, shortness of breath and wheezing.   Cardiovascular: Negative for chest pain, palpitations and leg swelling.   Gastrointestinal: Negative for abdominal pain, blood in stool, constipation, diarrhea, heartburn and melena.  Genitourinary: Negative.   Skin: Negative.   Neurological: Positive for loss of consciousness. Negative for dizziness, tingling, tremors, sensory change, speech change, focal weakness, seizures and headaches.  Psychiatric/Behavioral: Positive for depression. Negative for hallucinations, memory loss, substance abuse and suicidal ideas. The patient is not nervous/anxious and does not have insomnia.     Family history- Review and unchanged  Social history- Review and unchanged  Physical Exam: BP 128/68   Pulse 74  Temp 97.7 F (36.5 C)   Resp 14   Ht 5\' 3"  (1.6 m)   Wt 144 lb 9.6 oz (65.6 kg)   SpO2 98%   BMI 25.61 kg/m  Wt Readings from Last 3 Encounters:  05/30/17 144 lb 9.6 oz (65.6 kg)  05/14/17 146 lb (66.2 kg)  02/17/17 149 lb (67.6 kg)    General Appearance: Well nourished well developed, in no apparent distress. Eyes: PERRLA, EOMs, conjunctiva no swelling or erythema ENT/Mouth: Ear canals normal without obstruction, swelling, erythma, discharge.  TMs normal bilaterally.  Oropharynx moist, clear, without exudate, or postoropharyngeal swelling. Neck: Supple, thyroid normal,no cervical adenopathy  Respiratory: Respiratory effort normal, Breath sounds clear A&P without rhonchi, wheeze, or rale.  No retractions, no accessory usage. Cardio: RRR with no MRGs. Brisk peripheral pulses without edema.  Abdomen: Soft, + BS,  Non tender, no guarding, rebound, hernias, masses. Musculoskeletal: Full ROM, 5/5 strength, Normal gait Skin: Warm, dry without rashes, lesions, ecchymosis.  Neuro: Awake and oriented X 3, Cranial nerves intact. Normal muscle tone, no cerebellar symptoms. Psych: Normal affect, Insight and Judgment appropriate.    Vicie Mutters, PA-C 11:11 AM [facility]

## 2017-05-30 ENCOUNTER — Ambulatory Visit: Payer: Self-pay | Admitting: Internal Medicine

## 2017-05-30 ENCOUNTER — Encounter: Payer: Self-pay | Admitting: Physician Assistant

## 2017-05-30 ENCOUNTER — Ambulatory Visit (INDEPENDENT_AMBULATORY_CARE_PROVIDER_SITE_OTHER): Payer: Medicare Other | Admitting: Physician Assistant

## 2017-05-30 VITALS — BP 128/68 | HR 74 | Temp 97.7°F | Resp 14 | Ht 63.0 in | Wt 144.6 lb

## 2017-05-30 DIAGNOSIS — Z79899 Other long term (current) drug therapy: Secondary | ICD-10-CM

## 2017-05-30 DIAGNOSIS — N183 Chronic kidney disease, stage 3 unspecified: Secondary | ICD-10-CM

## 2017-05-30 DIAGNOSIS — R55 Syncope and collapse: Secondary | ICD-10-CM

## 2017-05-30 DIAGNOSIS — I10 Essential (primary) hypertension: Secondary | ICD-10-CM | POA: Diagnosis not present

## 2017-05-30 DIAGNOSIS — R350 Frequency of micturition: Secondary | ICD-10-CM

## 2017-05-30 DIAGNOSIS — I2510 Atherosclerotic heart disease of native coronary artery without angina pectoris: Secondary | ICD-10-CM

## 2017-05-30 DIAGNOSIS — I5022 Chronic systolic (congestive) heart failure: Secondary | ICD-10-CM | POA: Diagnosis not present

## 2017-05-30 DIAGNOSIS — E782 Mixed hyperlipidemia: Secondary | ICD-10-CM

## 2017-05-30 LAB — IRON AND TIBC
%SAT: 34 % (ref 11–50)
Iron: 112 ug/dL (ref 45–160)
TIBC: 333 ug/dL (ref 250–450)
UIBC: 221 ug/dL

## 2017-05-30 LAB — CBC WITH DIFFERENTIAL/PLATELET
Basophils Absolute: 80 cells/uL (ref 0–200)
Basophils Relative: 1 %
Eosinophils Absolute: 160 cells/uL (ref 15–500)
Eosinophils Relative: 2 %
HEMATOCRIT: 43 % (ref 35.0–45.0)
Hemoglobin: 14.6 g/dL (ref 11.7–15.5)
LYMPHS PCT: 32 %
Lymphs Abs: 2560 cells/uL (ref 850–3900)
MCH: 30.7 pg (ref 27.0–33.0)
MCHC: 34 g/dL (ref 32.0–36.0)
MCV: 90.5 fL (ref 80.0–100.0)
MONO ABS: 560 {cells}/uL (ref 200–950)
MONOS PCT: 7 %
MPV: 11.3 fL (ref 7.5–12.5)
NEUTROS PCT: 58 %
Neutro Abs: 4640 cells/uL (ref 1500–7800)
PLATELETS: 264 10*3/uL (ref 140–400)
RBC: 4.75 MIL/uL (ref 3.80–5.10)
RDW: 13.1 % (ref 11.0–15.0)
WBC: 8 10*3/uL (ref 3.8–10.8)

## 2017-05-30 LAB — BASIC METABOLIC PANEL WITH GFR
BUN: 21 mg/dL (ref 7–25)
CALCIUM: 9.9 mg/dL (ref 8.6–10.4)
CO2: 25 mmol/L (ref 20–31)
Chloride: 104 mmol/L (ref 98–110)
Creat: 1.29 mg/dL — ABNORMAL HIGH (ref 0.60–0.88)
GFR, EST AFRICAN AMERICAN: 42 mL/min — AB (ref >=60)
GFR, EST NON AFRICAN AMERICAN: 37 mL/min — AB (ref >=60)
GLUCOSE: 89 mg/dL (ref 65–99)
Potassium: 4.6 mmol/L (ref 3.5–5.3)
Sodium: 142 mmol/L (ref 135–146)

## 2017-05-30 LAB — LIPID PANEL
Cholesterol: 222 mg/dL — ABNORMAL HIGH (ref <200)
HDL: 64 mg/dL (ref >50)
LDL Cholesterol: 130 mg/dL — ABNORMAL HIGH (ref <100)
TRIGLYCERIDES: 140 mg/dL (ref <150)
Total CHOL/HDL Ratio: 3.5 Ratio (ref <5.0)
VLDL: 28 mg/dL (ref <30)

## 2017-05-30 LAB — HEPATIC FUNCTION PANEL
ALT: 10 U/L (ref 6–29)
AST: 17 U/L (ref 10–35)
Albumin: 4.5 g/dL (ref 3.6–5.1)
Alkaline Phosphatase: 48 U/L (ref 33–130)
BILIRUBIN INDIRECT: 0.5 mg/dL (ref 0.2–1.2)
Bilirubin, Direct: 0.1 mg/dL (ref <=0.2)
TOTAL PROTEIN: 6.8 g/dL (ref 6.1–8.1)
Total Bilirubin: 0.6 mg/dL (ref 0.2–1.2)

## 2017-05-30 NOTE — Patient Instructions (Signed)
[facility] Address: [address], [place], [place] [address]  Phone: [phone]   Syncope Syncope is when you temporarily lose consciousness. Syncope may also be called fainting or passing out. It is caused by a sudden decrease in blood flow to the brain. Even though most causes of syncope are not dangerous, syncope can be a sign of a serious medical problem. Signs that you may be about to faint include:  Feeling dizzy or light-headed.  Feeling nauseous.  Seeing all white or all black in your field of vision.  Having cold, clammy skin.  If you fainted, get medical help right away.Call your local emergency services (911 in the [place] Do not drive yourself to the hospital. Follow these instructions at home: Pay attention to any changes in your symptoms. Take these actions to help with your condition:  Have someone stay with you until you feel stable.  Do not drive, use machinery, or play sports until your health care provider says it is okay.  Keep all follow-up visits as told by your health care provider. This is important.  If you start to feel like you might faint, lie down right away and raise (elevate) your feet above the level of your heart. Breathe deeply and steadily. Wait until all of the symptoms have passed.  Drink enough fluid to keep your urine clear or pale yellow.  If you are taking blood pressure or heart medicine, get up slowly and take several minutes to sit and then stand. This can reduce dizziness.  Take over-the-counter and prescription medicines only as told by your health care provider.  Get help right away if:  You have a severe headache.  You have unusual pain in your chest, abdomen, or back.  You are bleeding from your mouth or rectum, or you have black or tarry stool.  You have a very fast or irregular heartbeat (palpitations).  You have pain with breathing.  You faint once or repeatedly.  You have a seizure.  You are  confused.  You have trouble walking.  You have severe weakness.  You have vision problems. These symptoms may represent a serious problem that is an emergency. Do not wait to see if your symptoms will go away. Get medical help right away. Call your local emergency services (911 in the [place] Do not drive yourself to the hospital. This information is not intended to replace advice given to you by your health care provider. Make sure you discuss any questions you have with your health care provider. Document Released: 11/25/2005 Document Revised: 05/02/2016 Document Reviewed: 08/09/2015 Elsevier Interactive Patient Education  2017 [employer].

## 2017-05-31 LAB — URINALYSIS, MICROSCOPIC ONLY
Casts: NONE SEEN [LPF]
Crystals: NONE SEEN [HPF]
RBC / HPF: NONE SEEN RBC/HPF (ref <=2)
Yeast: NONE SEEN [HPF]

## 2017-05-31 LAB — MAGNESIUM: Magnesium: 2.2 mg/dL (ref 1.5–2.5)

## 2017-05-31 LAB — URINALYSIS, ROUTINE W REFLEX MICROSCOPIC
Bilirubin Urine: NEGATIVE
Glucose, UA: NEGATIVE
Hgb urine dipstick: NEGATIVE
Ketones, ur: NEGATIVE
NITRITE: NEGATIVE
PH: 5.5 (ref 5.0–8.0)
Protein, ur: NEGATIVE
Specific Gravity, Urine: 1.021 (ref 1.001–1.035)

## 2017-05-31 LAB — TSH: TSH: 2.46 mIU/L

## 2017-05-31 LAB — VITAMIN B12: VITAMIN B 12: 375 pg/mL (ref 200–1100)

## 2017-06-01 LAB — URINE CULTURE

## 2017-06-02 NOTE — Progress Notes (Signed)
Pt aware of lab results & voiced understanding of those results.

## 2017-06-02 NOTE — Progress Notes (Signed)
LVM for pt to return office call for LAB results.

## 2017-07-09 NOTE — Progress Notes (Signed)
   Subjective:    Patient ID: [name], female    DOB: 05-14-1925, 83 y.o.   MRN: [id number]  HPI 83 y.o. WF with history of NICDMP s/p AICD, EF 25% in 2014, carotid US 2014. Negative stress test,  presents with syncopal episodes x 2 months. She has seen Cardio, had pacemaker interrogation and normal EKG.  Has had 6 total episodes of possible pre syncope, latest episode she was pulling empty garbage can down the drive way, she turned around walking up the drive way, as soon as she got at the door she felt "something coming on", then "falls" to the ground without any bruising or injury,  no loss of bladder control, no confusion. Feels like memory has been worse and will sometimes loose track of time after the syncopal episode. No SOB, no CP, no dizziness, no bowel/bladder control loss. She is grieving over lose of her oldest son, died in May 07, 2023. Admits to not drinking enough water, is eating and sleeping well. No diarrhea, nausea, constipation.    Blood pressure 124/74, pulse 74, temperature 98.1 F (36.7 C), resp. rate 14, height 5\' 3"  (1.6 m), weight 147 lb 12.8 oz (67 kg), SpO2 97 %.    Review of Systems See above    Objective:   Physical Exam General Appearance: Well nourished well developed, in no apparent distress. Eyes: PERRLA, EOMs, conjunctiva no swelling or erythema ENT/Mouth: Ear canals normal without obstruction, swelling, erythma, discharge.  TMs normal bilaterally.  Oropharynx moist, clear, without exudate, or postoropharyngeal swelling. Neck: Supple, thyroid normal,no cervical adenopathy  Respiratory: Respiratory effort normal, Breath sounds clear A&P without rhonchi, wheeze, or rale.  No retractions, no accessory usage. Cardio: RRR with no MRGs. Brisk peripheral pulses without edema.  Abdomen: Soft, + BS,  Non tender, no guarding, rebound, hernias, masses. Musculoskeletal: Full ROM, 5/5 strength, unsteady gait Skin: Warm, dry without rashes, lesions, ecchymosis.  Neuro:  Awake and oriented X 3, Cranial nerves intact. Normal muscle tone, no cerebellar symptoms. Psych: Normal affect, Insight and Judgment appropriate.      Assessment & Plan:  Dilated cardiomyopathy, nonischemic Follow up with heart doctor ASAP  Dizziness No driving until this is resolved Will get CT since has AICD Will refer to neuro, ? Need EEG ? From grief/vasovagal, will rule out everything else first - CBC with Differential/Platelet - BASIC METABOLIC PANEL WITH GFR - Hepatic function panel - TSH - Ambulatory referral to Neurology - VAS US CAROTID; Future - CT HEAD W & WO CONTRAST; Future   Biventricular cardiac pacemaker in situ Follow up cardio  Abnormal urinary stream - Urinalysis, Routine w reflex microscopic  Essential hypertension - TSH  The patient was advised to call immediately if she has any concerning symptoms in the interval. The patient voices understanding of current treatment options and is in agreement with the current care plan.The patient knows to call the clinic with any problems, questions or concerns or go to the ER if any further progression of symptoms.

## 2017-07-10 ENCOUNTER — Encounter: Payer: Self-pay | Admitting: Physician Assistant

## 2017-07-10 ENCOUNTER — Ambulatory Visit (INDEPENDENT_AMBULATORY_CARE_PROVIDER_SITE_OTHER): Payer: Medicare Other | Admitting: Physician Assistant

## 2017-07-10 VITALS — BP 124/74 | HR 74 | Temp 98.1°F | Resp 14 | Ht 63.0 in | Wt 147.8 lb

## 2017-07-10 DIAGNOSIS — I10 Essential (primary) hypertension: Secondary | ICD-10-CM | POA: Diagnosis not present

## 2017-07-10 DIAGNOSIS — R42 Dizziness and giddiness: Secondary | ICD-10-CM | POA: Diagnosis not present

## 2017-07-10 DIAGNOSIS — Z950 Presence of cardiac pacemaker: Secondary | ICD-10-CM | POA: Diagnosis not present

## 2017-07-10 DIAGNOSIS — R39198 Other difficulties with micturition: Secondary | ICD-10-CM

## 2017-07-10 DIAGNOSIS — I420 Dilated cardiomyopathy: Secondary | ICD-10-CM

## 2017-07-10 NOTE — Patient Instructions (Addendum)
No driving Follow up cardiology again for possible echo and reevaluation, possible echo Will get carotid US Will get CT head since has AICD Will refer to neuro for possible EEG and evaluation Increase water!!   Syncope Syncope is when you temporarily lose consciousness. Syncope may also be called fainting or passing out. It is caused by a sudden decrease in blood flow to the brain. Even though most causes of syncope are not dangerous, syncope can be a sign of a serious medical problem. Signs that you may be about to faint include:  Feeling dizzy or light-headed.  Feeling nauseous.  Seeing all white or all black in your field of vision.  Having cold, clammy skin.  If you fainted, get medical help right away.Call your local emergency services (911 in the [place] Do not drive yourself to the hospital. Follow these instructions at home: Pay attention to any changes in your symptoms. Take these actions to help with your condition:  Have someone stay with you until you feel stable.  Do not drive, use machinery, or play sports until your health care provider says it is okay.  Keep all follow-up visits as told by your health care provider. This is important.  If you start to feel like you might faint, lie down right away and raise (elevate) your feet above the level of your heart. Breathe deeply and steadily. Wait until all of the symptoms have passed.  Drink enough fluid to keep your urine clear or pale yellow.  If you are taking blood pressure or heart medicine, get up slowly and take several minutes to sit and then stand. This can reduce dizziness.  Take over-the-counter and prescription medicines only as told by your health care provider.  Get help right away if:  You have a severe headache.  You have unusual pain in your chest, abdomen, or back.  You are bleeding from your mouth or rectum, or you have black or tarry stool.  You have a very fast or irregular heartbeat  (palpitations).  You have pain with breathing.  You faint once or repeatedly.  You have a seizure.  You are confused.  You have trouble walking.  You have severe weakness.  You have vision problems. These symptoms may represent a serious problem that is an emergency. Do not wait to see if your symptoms will go away. Get medical help right away. Call your local emergency services (911 in the [place] Do not drive yourself to the hospital. This information is not intended to replace advice given to you by your health care provider. Make sure you discuss any questions you have with your health care provider. Document Released: 11/25/2005 Document Revised: 05/02/2016 Document Reviewed: 08/09/2015 Elsevier Interactive Patient Education  2017 [employer].

## 2017-07-11 LAB — HEPATIC FUNCTION PANEL
ALK PHOS: 51 U/L (ref 33–130)
ALT: 10 U/L (ref 6–29)
AST: 16 U/L (ref 10–35)
Albumin: 4 g/dL (ref 3.6–5.1)
BILIRUBIN DIRECT: 0.1 mg/dL (ref <=0.2)
BILIRUBIN INDIRECT: 0.3 mg/dL (ref 0.2–1.2)
BILIRUBIN TOTAL: 0.4 mg/dL (ref 0.2–1.2)
TOTAL PROTEIN: 6.2 g/dL (ref 6.1–8.1)

## 2017-07-11 LAB — CBC WITH DIFFERENTIAL/PLATELET
BASOS PCT: 1 %
Basophils Absolute: 73 cells/uL (ref 0–200)
EOS PCT: 3 %
Eosinophils Absolute: 219 cells/uL (ref 15–500)
HEMATOCRIT: 39.4 % (ref 35.0–45.0)
Hemoglobin: 13.3 g/dL (ref 11.7–15.5)
LYMPHS ABS: 2263 {cells}/uL (ref 850–3900)
LYMPHS PCT: 31 %
MCH: 30.9 pg (ref 27.0–33.0)
MCHC: 33.8 g/dL (ref 32.0–36.0)
MCV: 91.6 fL (ref 80.0–100.0)
MONO ABS: 511 {cells}/uL (ref 200–950)
MONOS PCT: 7 %
MPV: 10.8 fL (ref 7.5–12.5)
NEUTROS PCT: 58 %
Neutro Abs: 4234 cells/uL (ref 1500–7800)
PLATELETS: 269 10*3/uL (ref 140–400)
RBC: 4.3 MIL/uL (ref 3.80–5.10)
RDW: 13.5 % (ref 11.0–15.0)
WBC: 7.3 10*3/uL (ref 3.8–10.8)

## 2017-07-11 LAB — URINALYSIS, MICROSCOPIC ONLY
Casts: NONE SEEN [LPF]
Crystals: NONE SEEN [HPF]
Yeast: NONE SEEN [HPF]

## 2017-07-11 LAB — BASIC METABOLIC PANEL WITH GFR
BUN: 20 mg/dL (ref 7–25)
CALCIUM: 9.5 mg/dL (ref 8.6–10.4)
CO2: 24 mmol/L (ref 20–31)
Chloride: 103 mmol/L (ref 98–110)
Creat: 1.43 mg/dL — ABNORMAL HIGH (ref 0.60–0.88)
GFR, EST AFRICAN AMERICAN: 37 mL/min — AB (ref >=60)
GFR, EST NON AFRICAN AMERICAN: 32 mL/min — AB (ref >=60)
Glucose, Bld: 142 mg/dL — ABNORMAL HIGH (ref 65–99)
POTASSIUM: 4.4 mmol/L (ref 3.5–5.3)
SODIUM: 141 mmol/L (ref 135–146)

## 2017-07-11 LAB — TSH: TSH: 1.51 mIU/L

## 2017-07-11 LAB — URINALYSIS, ROUTINE W REFLEX MICROSCOPIC
Bilirubin Urine: NEGATIVE
Glucose, UA: NEGATIVE
HGB URINE DIPSTICK: NEGATIVE
Ketones, ur: NEGATIVE
NITRITE: NEGATIVE
PROTEIN: NEGATIVE
Specific Gravity, Urine: 1.007 (ref 1.001–1.035)
pH: 5.5 (ref 5.0–8.0)

## 2017-07-11 NOTE — Progress Notes (Signed)
Pt aware of lab results & voiced understanding of those results.

## 2017-07-15 ENCOUNTER — Ambulatory Visit (HOSPITAL_COMMUNITY)
Admission: RE | Admit: 2017-07-15 | Discharge: 2017-07-15 | Disposition: A | Discharge status: Home / Self Care | Payer: Medicare Other | Source: Ambulatory Visit | Attending: Physician Assistant | Admitting: Physician Assistant

## 2017-07-15 ENCOUNTER — Encounter: Payer: Self-pay | Admitting: Radiology

## 2017-07-15 DIAGNOSIS — R42 Dizziness and giddiness: Secondary | ICD-10-CM | POA: Insufficient documentation

## 2017-07-15 LAB — VAS US CAROTID
LCCADDIAS: -12 cm/s
LCCAPSYS: 65 cm/s
LEFT ECA DIAS: -5 cm/s
LEFT VERTEBRAL DIAS: 16 cm/s
LICADDIAS: -11 cm/s
LICADSYS: -82 cm/s
Left CCA dist sys: -62 cm/s
Left CCA prox dias: 9 cm/s
Left ICA prox dias: -9 cm/s
Left ICA prox sys: -51 cm/s
RCCADSYS: -66 cm/s
RCCAPDIAS: 9 cm/s
Right CCA prox sys: 61 cm/s

## 2017-07-15 NOTE — Progress Notes (Signed)
*  PRELIMINARY RESULTS* Vascular Ultrasound Carotid Duplex (Doppler) has been completed.   Findings suggest 1-39% internal carotid artery stenosis bilaterally. Vertebral arteries are patent with antegrade flow.  07/15/2017 11:23 AM Maudry Mayhew, BS, RVT, RDCS, RDMS

## 2017-07-16 ENCOUNTER — Ambulatory Visit
Admission: RE | Admit: 2017-07-16 | Discharge: 2017-07-16 | Disposition: A | Discharge status: Home / Self Care | Payer: Medicare Other | Source: Ambulatory Visit | Attending: Physician Assistant | Admitting: Physician Assistant

## 2017-07-16 DIAGNOSIS — R42 Dizziness and giddiness: Secondary | ICD-10-CM

## 2017-07-16 MED ORDER — IOPAMIDOL (ISOVUE-300) INJECTION 61%
50.0000 mL | Freq: Once | INTRAVENOUS | Status: AC | PRN
  Administered 2017-07-16: 50 mL via INTRAVENOUS

## 2017-07-16 NOTE — Progress Notes (Signed)
Pt aware of lab results & voiced understanding of those results.

## 2017-07-22 DIAGNOSIS — I951 Orthostatic hypotension: Secondary | ICD-10-CM | POA: Diagnosis not present

## 2017-07-22 DIAGNOSIS — I119 Hypertensive heart disease without heart failure: Secondary | ICD-10-CM | POA: Diagnosis not present

## 2017-07-22 DIAGNOSIS — R0609 Other forms of dyspnea: Secondary | ICD-10-CM | POA: Diagnosis not present

## 2017-07-22 DIAGNOSIS — R55 Syncope and collapse: Secondary | ICD-10-CM | POA: Diagnosis not present

## 2017-07-22 DIAGNOSIS — I6529 Occlusion and stenosis of unspecified carotid artery: Secondary | ICD-10-CM | POA: Diagnosis not present

## 2017-07-22 DIAGNOSIS — Z8673 Personal history of transient ischemic attack (TIA), and cerebral infarction without residual deficits: Secondary | ICD-10-CM | POA: Diagnosis not present

## 2017-07-22 DIAGNOSIS — Z950 Presence of cardiac pacemaker: Secondary | ICD-10-CM | POA: Diagnosis not present

## 2017-07-22 DIAGNOSIS — K219 Gastro-esophageal reflux disease without esophagitis: Secondary | ICD-10-CM | POA: Diagnosis not present

## 2017-07-22 DIAGNOSIS — E785 Hyperlipidemia, unspecified: Secondary | ICD-10-CM | POA: Diagnosis not present

## 2017-07-28 ENCOUNTER — Telehealth: Payer: Self-pay | Admitting: Physician Assistant

## 2017-07-28 NOTE — Telephone Encounter (Signed)
Patient is having presyncopal/syncopal episodes, normal Ct head, has been sent to neuro for possible EEG, will follow up with Dr. Lovena Le her cardiologist, will increase fluids and is not driving. Reminded that if it happens again go to the ER

## 2017-07-29 ENCOUNTER — Ambulatory Visit (INDEPENDENT_AMBULATORY_CARE_PROVIDER_SITE_OTHER): Payer: Medicare Other | Admitting: Physician Assistant

## 2017-07-29 VITALS — BP 136/70 | HR 84 | Ht 63.0 in | Wt 149.0 lb

## 2017-07-29 DIAGNOSIS — Z950 Presence of cardiac pacemaker: Secondary | ICD-10-CM

## 2017-07-29 DIAGNOSIS — I5022 Chronic systolic (congestive) heart failure: Secondary | ICD-10-CM

## 2017-07-29 DIAGNOSIS — I420 Dilated cardiomyopathy: Secondary | ICD-10-CM

## 2017-07-29 DIAGNOSIS — I2510 Atherosclerotic heart disease of native coronary artery without angina pectoris: Secondary | ICD-10-CM

## 2017-07-29 DIAGNOSIS — R55 Syncope and collapse: Secondary | ICD-10-CM | POA: Diagnosis not present

## 2017-07-29 NOTE — Patient Instructions (Addendum)
Medication Instructions:   Your physician recommends that you continue on your current medications as directed. Please refer to the Current Medication list given to you today.  If you need a refill on your cardiac medications before your next appointment, please call your pharmacy.  Labwork: NONE ORDERED  TODAY    Testing/Procedures: NONE ORDERED  TODAY    Follow-Up: Your physician wants you to follow-up in:  [place] will receive a reminder letter in the mail two months in advance. If you don't receive a letter, please call our office to schedule the follow-up appointment.   APPOINTMENT WITH DR  Landry Corporal IN 2 TO 3 WEEKS   Any Other Special Instructions Will Be Listed Below (If Applicable).

## 2017-07-29 NOTE — Progress Notes (Addendum)
Cardiology Office Note Date:  07/29/2017  Patient ID:  [name], [name] 06/09/1925, MRN [id number] PCP:  Unk Pinto, MD  Cardiologist: Dr. Wynonia Lawman  Electrophysiologist: Dr. Lovena Le   Chief Complaint: near syncope  History of Present Illness: [name] is a 83 y.o. female with history of chronic CHF (systolic), CM w/ hx of an ICD that was removed for CRT-P, HTN, HLD, DM.  AFib is listed on her PMHx list but I do not see when this was, I do not see it specifically mentioned by Dr. Lovena Le or Dr. Thurman Coyer available notes.  She was seen by myself inJune Dr. Lovena Le, last seen by him in August 2017 at that time doing well.  She reports 3 weeks ago had been sitting outside talking with 2 of her kids feeling well, had pulled some weeds while there, she stood up and walked back to her shed to throw the grass out (a fairly short distance), she does not recall throwing out the grass but woke back in front of her garage in the driveway with her neighbor arousing her, she asked her to get her daughter from the house, she asked them not to call 911, was helped to the house but was feeling OK and until Friday had done well without symptoms.  She does not recall clearly feeling poorly prior to fainting, but did initially feel very weak and unable to get up without assistance.  The second/last episode was last week Friday, was unloading groceries from the house, had made one trip in with several bags and when she went around the back if the car to get anther load fell/ultimately fainted to her knees.  She again felt weak initially afterwards. She did seem to know she felt weak prior to that episode.  She had not with these episodes or otherwise had any kind of CP, no palpitations, no SOB.  She has not had any dizzy spells or near syncopal episode either.  She does mention a significant increase in personal; stress with the death of her son in 04/27/2023, this in mentioning it today makes her emotional and  admittedly reports she spends hours crying at times.  She has discussed this with her PMD.     Unfortunately she comes today with ongoing symptoms.  She has seen her PMD and had a negative CT head/brain for stroke, carotids were without obstructive disease.  She has not seen Dr. Wynonia Lawman.   She reports that her weak spells always occur when outside, but not after prolonged periods of being out.  They have all occurred while standing, perhaps once a week.  She feels little (but some) warning, and will fall/drop to her knees, but able to steady herself without injury, is aware of her surroundings, but weak, and remains weak for several minutes afterwards.  Last weekend she had walked to the mailbox, put the mail in and began feeling weak, though quickly so weak her knees buckled.  She did not faint.  She was able to slowly get herself up and felt weak the walk back into the house, sat and recovered.  She does not have these episodes in her home, or inside is the only commonality she has noticed.  She has no associated symptoms, no palpitations, no CP, no diaphoresis, perhaps a little nausea.  She is not driving until this is settled/resolved.  It is anxiety provoking.  She denies any symptoms otherwise as well.   Device information: MDT CRT-P, implanted 05/03/13, RA lead  from 2005  Past Medical History:  Diagnosis Date  . A-fib ([place])   . Biventricular cardiac pacemaker in situ    05/06/13  Old AICD explanted and 6949 lead capped  RV lead  Medtronic 5076 [id number]  LV lead  Medtronic 4194  [id number] V Medtronic biventricular pacer  PVX [id number]   . Diabetes mellitus without complication ([place])   . GERD (gastroesophageal reflux disease)   . History of duodenal ulcer   . History of esophageal stricture   . Hyperlipidemia   . Hypertension   . Occlusion and stenosis of carotid artery without mention of cerebral infarction   . Peripheral neuropathy   . Right groin pain 12/12/2014  . Secondary  cardiomyopathy, unspecified   . Transient global amnesia     Past Surgical History:  Procedure Laterality Date  . ABDOMINAL HYSTERECTOMY    . APPENDECTOMY    . BI-VENTRICULAR PACEMAKER UPGRADE N/A 05/05/2013   Procedure: BI-VENTRICULAR PACEMAKER UPGRADE;  Surgeon: Evans Lance, MD;  Location: [facility] CATH LAB;  Service: Cardiovascular;  Laterality: N/A;  . BIV PACEMAKER GENERATOR CHANGE OUT  05/05/2013   MDT CRTP upgrade (previously implanted dual chamber ICD) by Dr Lovena Le.  New RV/LV leads placed.   Marland Kitchen BREAST SURGERY Left 1985   Negative  Biopsy  . CARDIAC CATHETERIZATION     04/2003  . CARDIAC DEFIBRILLATOR PLACEMENT  09/2004   MDT ICD implanted for primary prevention; device upgrade to CRTP in 04-2013  . CARPAL TUNNEL RELEASE    . CHOLECYSTECTOMY      Current Outpatient Prescriptions  Medication Sig Dispense Refill  . aspirin EC 81 MG tablet Take 81 mg by mouth daily.    Marland Kitchen b complex vitamins capsule Take 1 capsule by mouth daily.     . bumetanide (BUMEX) 1 MG tablet Take 0.5 mg by mouth daily.  12  . carvedilol (COREG) 6.25 MG tablet Take 6.25 mg by mouth 2 (two) times daily with a meal.    . Cholecalciferol (VITAMIN D) 2000 units CAPS Take 1 capsule by mouth 2 (two) times daily.    . clopidogrel (PLAVIX) 75 MG tablet TAKE 1 TABLET BY MOUTH EVERY DAY TO PREVENT HEART ATTACK 30 tablet 6  . ESTRACE VAGINAL 0.1 MG/GM vaginal cream     . FREESTYLE LITE test strip TEST GLUCOSE ONCE DAILY AS DIRECTED 100 each 2  . gabapentin (NEURONTIN) 600 MG tablet TAKE 1 TABLET BY MOUTH AT BEDTIME FOR NEUROPATHY 90 tablet 1  . Lancets (FREESTYLE) lancets Check blood sugar 1 time daily-DX-R73.03 100 each 2  . losartan (COZAAR) 50 MG tablet Take 25 mg by mouth daily.     . magnesium gluconate (MAGONATE) 500 MG tablet Take 500 mg by mouth daily.     . ranitidine (ZANTAC) 300 MG tablet TAKE 1 TABLET TWICE A DAY FOR REFLUX 180 tablet 1   No current facility-administered medications for this visit.      Allergies:   Nexium [esomeprazole magnesium]   Social History:  The patient  reports that she is a non-smoker but has been exposed to tobacco smoke. She has never used smokeless tobacco. She reports that she does not drink alcohol or use drugs.   Family History:  The patient's family history includes Cancer in her maternal grandmother and sister; Dementia in her mother; Diabetes in her daughter, mother, and sister; Heart Problems in her father and sister; Heart attack in her brother and son; Heart disease in her brother and son; Hyperlipidemia in her daughter,  sister, and sister; Hypertension in her daughter, sister, and sister; Stroke in her daughter.  ROS:  Please see the history of present illness.   All other systems are reviewed and otherwise negative.   PHYSICAL EXAM:  VS:  BP 136/70   Pulse 84   Ht 5\' 3"  (1.6 m)   Wt 149 lb (67.6 kg)   BMI 26.39 kg/m  BMI: Body mass index is 26.39 kg/m. Well nourished, well developed, in no acute distress  HEENT: normocephalic, atraumatic  Neck: no JVD, carotid bruits or masses Cardiac:  RRR; 1/6 SM, no rubs, or gallops Lungs:  CTA b/l, no wheezing, rhonchi or rales  Abd: soft, nontender MS: no deformity, age appropriate atrophy Ext: no edema  Skin: warm and dry, no rash Neuro:  No gross deficits appreciated Psych: euthymic mood, full affect  PPM site is stable, no tethering or discomfort   EKG:  Done 05/14/17 AV paced rhythm, unchanged 03/05/17 SR/V paced PPM interrogation is done today and reviewed by myself: battery and lead measurements are good, one SVT (labeled VT) of one second duration.  No other device observations, VT detection rate was decreased to 140bpm given near syncope, she is BiVe pacing 99.5%, not device dependent, no VT or AF  06/22/13: TTE Study Conclusions - Left ventricle: The cavity size was normal. There was moderate concentric hypertrophy. Systolic function was severely reduced. The estimated ejection  fraction was in the range of 25% to 30%. Wall motion was normal; there were no regional wall motion abnormalities. - Ventricular septum: Septal motion showed abnormal function and moderate dyssynergy. These changes are consistent with right ventricular pacing. - Aortic valve: Mild regurgitation. Valve area: 1.02cm^2(VTI). Valve area: 1.02cm^2 (Vmax). - Mitral valve: Mild regurgitation. - Left atrium: The atrium was mildly dilated.  Recent Labs: 05/30/2017: Magnesium 2.2 07/10/2017: ALT 10; BUN 20; Creat 1.43; Hemoglobin 13.3; Platelets 269; Potassium 4.4; Sodium 141; TSH 1.51  05/30/2017: Cholesterol 222; HDL 64; LDL Cholesterol 130; Total CHOL/HDL Ratio 3.5; Triglycerides 140; VLDL 28   Estimated Creatinine Clearance: 24.6 mL/min (A) (by C-G formula based on SCr of 1.43 mg/dL (H)).   Wt Readings from Last 3 Encounters:  07/29/17 149 lb (67.6 kg)  07/10/17 147 lb 12.8 oz (67 kg)  05/30/17 144 lb 9.6 oz (65.6 kg)     Other studies reviewed: Additional studies/records reviewed today include: summarized above  ASSESSMENT AND PLAN:  1. Syncope, near syncope     Her pacer function is intact, she has not had any arrhythmias or observations by her device that explain her syncope     Suspect orthostatic (orthostatic BP here today is negative), hypotension, she is on small dose of bumex, her weight is trending upwards, her OptiVol is below threshold. She is instructed to see Dr. Wynonia Lawman, would consider holding her bumex, though she will see him this week, perhaps today if possible and will defer to him  2. Chronic CHF (systolic)    Exam does not suggest fluid OL, Optivol is well below threshold, trending upwards    On BB/ARB, bumex  3. HTN    Looks good, no changes  Disposition: She will see Dr. Wynonia Lawman perhaps today,at least this week, her remotes are going to him (she reports shegot a new transmitter, is working and confirmed they are getting her transmission), we will see her  back in 1 year, sooner if needed.  She will let us know if unable to get in to see [facility].  Current medicines are reviewed  at length with the patient today.  The patient did not have any concerns regarding medicines.  Haywood Lasso, PA-C 07/29/2017 5:09 PM     [address] [place] [address] [phone] (office)  [phone] (fax)

## 2017-08-01 ENCOUNTER — Encounter: Payer: Self-pay | Admitting: Podiatry

## 2017-08-01 ENCOUNTER — Ambulatory Visit (INDEPENDENT_AMBULATORY_CARE_PROVIDER_SITE_OTHER): Payer: Medicare Other | Admitting: Podiatry

## 2017-08-01 DIAGNOSIS — B351 Tinea unguium: Secondary | ICD-10-CM

## 2017-08-01 DIAGNOSIS — M79676 Pain in unspecified toe(s): Secondary | ICD-10-CM | POA: Diagnosis not present

## 2017-08-03 NOTE — Progress Notes (Signed)
Subjective: 83 y.o. returns the office today for painful, elongated, thickened toenails which she cannot trim herself. Denies any redness or drainage around the nails. Denies any acute changes since last appointment and no new complaints today. Denies any systemic complaints such as fevers, chills, nausea, vomiting.   Objective: AAO 3, NAD DP/PT pulses palpable, CRT less than 3 seconds Nails hypertrophic, dystrophic, elongated, brittle, discolored 10. There is tenderness overlying the nails 1-5 bilaterally. There is no surrounding erythema or drainage along the nail sites. No open lesions or pre-ulcerative lesions are identified. No other areas of tenderness bilateral lower extremities. No overlying edema, erythema, increased warmth. No pain with calf compression, swelling, warmth, erythema.  Assessment: Patient presents with symptomatic onychomycosis  Plan: -Treatment options including alternatives, risks, complications were discussed -Nails sharply debrided 10 without complication/bleeding.  -Discussed daily foot inspection. If there are any changes, to call the office immediately.  -Follow-up in 3 months or sooner if any problems are to arise. In the meantime, encouraged to call the office with any questions, concerns, changes symptoms.  Celesta Gentile, DPM

## 2017-08-05 DIAGNOSIS — K219 Gastro-esophageal reflux disease without esophagitis: Secondary | ICD-10-CM | POA: Diagnosis not present

## 2017-08-05 DIAGNOSIS — R55 Syncope and collapse: Secondary | ICD-10-CM | POA: Diagnosis not present

## 2017-08-05 DIAGNOSIS — R0609 Other forms of dyspnea: Secondary | ICD-10-CM | POA: Diagnosis not present

## 2017-08-05 DIAGNOSIS — I6529 Occlusion and stenosis of unspecified carotid artery: Secondary | ICD-10-CM | POA: Diagnosis not present

## 2017-08-05 DIAGNOSIS — E785 Hyperlipidemia, unspecified: Secondary | ICD-10-CM | POA: Diagnosis not present

## 2017-08-05 DIAGNOSIS — I5022 Chronic systolic (congestive) heart failure: Secondary | ICD-10-CM | POA: Diagnosis not present

## 2017-08-05 DIAGNOSIS — Z8673 Personal history of transient ischemic attack (TIA), and cerebral infarction without residual deficits: Secondary | ICD-10-CM | POA: Diagnosis not present

## 2017-08-05 DIAGNOSIS — I951 Orthostatic hypotension: Secondary | ICD-10-CM | POA: Diagnosis not present

## 2017-08-05 DIAGNOSIS — I10 Essential (primary) hypertension: Secondary | ICD-10-CM | POA: Diagnosis not present

## 2017-08-05 DIAGNOSIS — Z950 Presence of cardiac pacemaker: Secondary | ICD-10-CM | POA: Diagnosis not present

## 2017-08-05 DIAGNOSIS — I420 Dilated cardiomyopathy: Secondary | ICD-10-CM | POA: Diagnosis not present

## 2017-08-06 ENCOUNTER — Ambulatory Visit: Payer: Medicare Other | Admitting: Internal Medicine

## 2017-08-06 DIAGNOSIS — R55 Syncope and collapse: Secondary | ICD-10-CM | POA: Diagnosis not present

## 2017-08-08 DIAGNOSIS — I6529 Occlusion and stenosis of unspecified carotid artery: Secondary | ICD-10-CM | POA: Diagnosis not present

## 2017-08-08 DIAGNOSIS — Z8673 Personal history of transient ischemic attack (TIA), and cerebral infarction without residual deficits: Secondary | ICD-10-CM | POA: Diagnosis not present

## 2017-08-08 DIAGNOSIS — R55 Syncope and collapse: Secondary | ICD-10-CM | POA: Diagnosis not present

## 2017-08-08 DIAGNOSIS — I428 Other cardiomyopathies: Secondary | ICD-10-CM | POA: Diagnosis not present

## 2017-08-08 DIAGNOSIS — Z9181 History of falling: Secondary | ICD-10-CM | POA: Diagnosis not present

## 2017-08-08 DIAGNOSIS — I447 Left bundle-branch block, unspecified: Secondary | ICD-10-CM | POA: Diagnosis not present

## 2017-08-08 DIAGNOSIS — G629 Polyneuropathy, unspecified: Secondary | ICD-10-CM | POA: Diagnosis not present

## 2017-08-08 DIAGNOSIS — R2681 Unsteadiness on feet: Secondary | ICD-10-CM | POA: Diagnosis not present

## 2017-08-08 DIAGNOSIS — R0600 Dyspnea, unspecified: Secondary | ICD-10-CM | POA: Diagnosis not present

## 2017-08-08 DIAGNOSIS — Z950 Presence of cardiac pacemaker: Secondary | ICD-10-CM | POA: Diagnosis not present

## 2017-08-08 DIAGNOSIS — I119 Hypertensive heart disease without heart failure: Secondary | ICD-10-CM | POA: Diagnosis not present

## 2017-08-11 DIAGNOSIS — I119 Hypertensive heart disease without heart failure: Secondary | ICD-10-CM | POA: Diagnosis not present

## 2017-08-11 DIAGNOSIS — R55 Syncope and collapse: Secondary | ICD-10-CM | POA: Diagnosis not present

## 2017-08-11 DIAGNOSIS — R2681 Unsteadiness on feet: Secondary | ICD-10-CM | POA: Diagnosis not present

## 2017-08-11 DIAGNOSIS — G629 Polyneuropathy, unspecified: Secondary | ICD-10-CM | POA: Diagnosis not present

## 2017-08-11 DIAGNOSIS — R0600 Dyspnea, unspecified: Secondary | ICD-10-CM | POA: Diagnosis not present

## 2017-08-11 DIAGNOSIS — I428 Other cardiomyopathies: Secondary | ICD-10-CM | POA: Diagnosis not present

## 2017-08-12 ENCOUNTER — Encounter: Payer: Medicare Other | Admitting: Physician Assistant

## 2017-08-13 DIAGNOSIS — R55 Syncope and collapse: Secondary | ICD-10-CM | POA: Diagnosis not present

## 2017-08-13 DIAGNOSIS — I428 Other cardiomyopathies: Secondary | ICD-10-CM | POA: Diagnosis not present

## 2017-08-13 DIAGNOSIS — R2681 Unsteadiness on feet: Secondary | ICD-10-CM | POA: Diagnosis not present

## 2017-08-13 DIAGNOSIS — I119 Hypertensive heart disease without heart failure: Secondary | ICD-10-CM | POA: Diagnosis not present

## 2017-08-13 DIAGNOSIS — G629 Polyneuropathy, unspecified: Secondary | ICD-10-CM | POA: Diagnosis not present

## 2017-08-13 DIAGNOSIS — R0600 Dyspnea, unspecified: Secondary | ICD-10-CM | POA: Diagnosis not present

## 2017-08-15 DIAGNOSIS — I119 Hypertensive heart disease without heart failure: Secondary | ICD-10-CM | POA: Diagnosis not present

## 2017-08-15 DIAGNOSIS — R0600 Dyspnea, unspecified: Secondary | ICD-10-CM | POA: Diagnosis not present

## 2017-08-15 DIAGNOSIS — I428 Other cardiomyopathies: Secondary | ICD-10-CM | POA: Diagnosis not present

## 2017-08-15 DIAGNOSIS — R55 Syncope and collapse: Secondary | ICD-10-CM | POA: Diagnosis not present

## 2017-08-15 DIAGNOSIS — G629 Polyneuropathy, unspecified: Secondary | ICD-10-CM | POA: Diagnosis not present

## 2017-08-15 DIAGNOSIS — R2681 Unsteadiness on feet: Secondary | ICD-10-CM | POA: Diagnosis not present

## 2017-08-18 DIAGNOSIS — G629 Polyneuropathy, unspecified: Secondary | ICD-10-CM | POA: Diagnosis not present

## 2017-08-18 DIAGNOSIS — I428 Other cardiomyopathies: Secondary | ICD-10-CM | POA: Diagnosis not present

## 2017-08-18 DIAGNOSIS — I119 Hypertensive heart disease without heart failure: Secondary | ICD-10-CM | POA: Diagnosis not present

## 2017-08-18 DIAGNOSIS — R55 Syncope and collapse: Secondary | ICD-10-CM | POA: Diagnosis not present

## 2017-08-18 DIAGNOSIS — R0600 Dyspnea, unspecified: Secondary | ICD-10-CM | POA: Diagnosis not present

## 2017-08-18 DIAGNOSIS — R2681 Unsteadiness on feet: Secondary | ICD-10-CM | POA: Diagnosis not present

## 2017-08-20 DIAGNOSIS — R2681 Unsteadiness on feet: Secondary | ICD-10-CM | POA: Diagnosis not present

## 2017-08-20 DIAGNOSIS — I428 Other cardiomyopathies: Secondary | ICD-10-CM | POA: Diagnosis not present

## 2017-08-20 DIAGNOSIS — I119 Hypertensive heart disease without heart failure: Secondary | ICD-10-CM | POA: Diagnosis not present

## 2017-08-20 DIAGNOSIS — R0600 Dyspnea, unspecified: Secondary | ICD-10-CM | POA: Diagnosis not present

## 2017-08-20 DIAGNOSIS — R55 Syncope and collapse: Secondary | ICD-10-CM | POA: Diagnosis not present

## 2017-08-20 DIAGNOSIS — G629 Polyneuropathy, unspecified: Secondary | ICD-10-CM | POA: Diagnosis not present

## 2017-08-22 DIAGNOSIS — R55 Syncope and collapse: Secondary | ICD-10-CM | POA: Diagnosis not present

## 2017-08-22 DIAGNOSIS — I119 Hypertensive heart disease without heart failure: Secondary | ICD-10-CM | POA: Diagnosis not present

## 2017-08-22 DIAGNOSIS — I428 Other cardiomyopathies: Secondary | ICD-10-CM | POA: Diagnosis not present

## 2017-08-22 DIAGNOSIS — R0600 Dyspnea, unspecified: Secondary | ICD-10-CM | POA: Diagnosis not present

## 2017-08-22 DIAGNOSIS — G629 Polyneuropathy, unspecified: Secondary | ICD-10-CM | POA: Diagnosis not present

## 2017-08-22 DIAGNOSIS — R2681 Unsteadiness on feet: Secondary | ICD-10-CM | POA: Diagnosis not present

## 2017-08-28 ENCOUNTER — Encounter (INDEPENDENT_AMBULATORY_CARE_PROVIDER_SITE_OTHER): Payer: Self-pay

## 2017-08-28 ENCOUNTER — Encounter: Payer: Self-pay | Admitting: Neurology

## 2017-08-28 ENCOUNTER — Ambulatory Visit (INDEPENDENT_AMBULATORY_CARE_PROVIDER_SITE_OTHER): Payer: Medicare Other | Admitting: Neurology

## 2017-08-28 VITALS — BP 164/82 | HR 64 | Ht 63.0 in | Wt 150.0 lb

## 2017-08-28 DIAGNOSIS — R55 Syncope and collapse: Secondary | ICD-10-CM | POA: Diagnosis not present

## 2017-08-28 DIAGNOSIS — I2510 Atherosclerotic heart disease of native coronary artery without angina pectoris: Secondary | ICD-10-CM | POA: Diagnosis not present

## 2017-08-28 DIAGNOSIS — R42 Dizziness and giddiness: Secondary | ICD-10-CM

## 2017-08-28 NOTE — Patient Instructions (Signed)
Near-Syncope °Near-syncope is when you suddenly get weak or dizzy, or you feel like you might pass out (faint). During an episode of near-syncope, you may: °· Feel dizzy or light-headed. °· Feel sick to your stomach (nauseous). °· See all white or all black. °· Have cold, clammy skin. ° °If you passed out, get help right away.Call your local emergency services (911 in the [place] Do not drive yourself to the hospital. °Follow these instructions at home: °Pay attention to any changes in your symptoms. Take these actions to help with your condition: °· Have someone stay with you until you feel stable. °· Do not drive, use machinery, or play sports until your doctor says it is okay. °· Keep all follow-up visits as told by your doctor. This is important. °· If you start to feel like you might pass out, lie down right away and raise (elevate) your feet above the level of your heart. Breathe deeply and steadily. Wait until all of the symptoms are gone. °· Drink enough fluid to keep your pee (urine) clear or pale yellow. °· If you are taking blood pressure or heart medicine, get up slowly and spend many minutes getting ready to sit and then stand. This can help with dizziness. °· Take over-the-counter and prescription medicines only as told by your doctor. ° °Get help right away if: °· You have a very bad headache. °· You have unusual pain in your chest, tummy, or back. °· You are bleeding from your mouth or rectum. °· You have black or tarry poop (stool). °· You have a very fast or uneven heartbeat (palpitations). °· You pass out one time or more than once. °· You have jerky movements that you cannot control (seizure). °· You are confused. °· You have trouble walking. °· You are very weak. °· You have vision problems. °These symptoms may be an emergency. Do not wait to see if the symptoms will go away. Get medical help right away. Call your local emergency services (911 in the [place] Do not drive yourself to the  hospital. °This information is not intended to replace advice given to you by your health care provider. Make sure you discuss any questions you have with your health care provider. °Document Released: 05/13/2008 Document Revised: 05/02/2016 Document Reviewed: 08/09/2015 °Elsevier Interactive Patient Education © 2017 [employer] ° °

## 2017-08-28 NOTE — Progress Notes (Signed)
Provider:  Larey Seat, M D  Primary Care Physician:  Unk Pinto, MD   Referring Provider: Unk Pinto, MD   Chief Complaint  Patient presents with  . New Patient (Initial Visit)    pt alone, rm 10. pt has struggled with dizziness most times it was outside, the episodes seems to be related to when she is exerting herself in activity.     HPI:  [name] is a 83 y.o. female , seen here as in a referral  from Dr. Melford Aase for a review of abnormal MRI.   [name] is a very spirited 83 year old female patient, and has minimal concerns about cognition, balance and neurological function.  They have also not been any recent changes in medication. The patient had dizzy spells and since she has an implanted pacemaker defibrillator was unable to to undergo MRI procedures. The CT was performed without contrast on 06/23/2013 at [place]. It was ordered by Dr. Vicie Mutters. There were no focal cortical lesions, some remote lacunar infarcts are noted in the cerebellum the brainstem was intact. The basal ganglia are intact and moderate brain atrophy was described but given the patient's age this is not out of proportion. The CT was interpreted by Dr. San Morelle.   Chief complaint according to patient : "I wanted to know if the brain is all right. "   Review of Systems: Out of a complete 14 system review, the patient complains of only the following symptoms, and all other reviewed systems are negative. Possible TIA, near-syncope , almost fainting spells. Dizziness.  Social History   Social History  . Marital status: Widowed    Spouse name: N/A  . Number of children: 2  . Years of education: 12   Occupational History  . Not on file.   Social History Main Topics  . Smoking status: Passive Smoke Exposure - Never Smoker  . Smokeless tobacco: Never Used     Comment: worked at [employer]  . Alcohol use No  . Drug use: No  . Sexual activity: Not on  file   Other Topics Concern  . Not on file   Social History Narrative   Caffeine Daily average (3 cups am).  Retired.    Widowed.  6 KIDS.  Lives with daughter at her home.    Family History  Problem Relation Age of Onset  . Dementia Mother   . Diabetes Mother   . Heart Problems Father   . Heart attack Son   . Heart disease Son   . Heart disease Brother   . Heart attack Brother   . Cancer Maternal Grandmother        breast  . Stroke Daughter   . Hypertension Daughter   . Hyperlipidemia Daughter   . Diabetes Daughter   . Diabetes Sister   . Hypertension Sister   . Hyperlipidemia Sister   . Hypertension Sister   . Hyperlipidemia Sister   . Cancer Sister        Breast  . Heart Problems Sister     Past Medical History:  Diagnosis Date  . A-fib ([place])   . Biventricular cardiac pacemaker in situ    05/06/13  Old AICD explanted and 6949 lead capped  RV lead  Medtronic 5076 [id number]  LV lead  Medtronic 4194  [id number] V Medtronic biventricular pacer  PVX [id number]   . Diabetes mellitus without complication ([place])   . GERD (gastroesophageal reflux disease)   .  History of duodenal ulcer   . History of esophageal stricture   . Hyperlipidemia   . Hypertension   . Occlusion and stenosis of carotid artery without mention of cerebral infarction   . Peripheral neuropathy   . Right groin pain 12/12/2014  . Secondary cardiomyopathy, unspecified   . Transient global amnesia     Past Surgical History:  Procedure Laterality Date  . ABDOMINAL HYSTERECTOMY    . APPENDECTOMY    . BI-VENTRICULAR PACEMAKER UPGRADE N/A 05/05/2013   Procedure: BI-VENTRICULAR PACEMAKER UPGRADE;  Surgeon: Evans Lance, MD;  Location: [facility] CATH LAB;  Service: Cardiovascular;  Laterality: N/A;  . BIV PACEMAKER GENERATOR CHANGE OUT  05/05/2013   MDT CRTP upgrade (previously implanted dual chamber ICD) by Dr Lovena Le.  New RV/LV leads placed.   Marland Kitchen BREAST SURGERY Left 1985   Negative  Biopsy  . CARDIAC  CATHETERIZATION     04/2003  . CARDIAC DEFIBRILLATOR PLACEMENT  09/2004   MDT ICD implanted for primary prevention; device upgrade to CRTP in 04-2013  . CARPAL TUNNEL RELEASE    . CHOLECYSTECTOMY      Current Outpatient Prescriptions  Medication Sig Dispense Refill  . aspirin EC 81 MG tablet Take 81 mg by mouth daily.    Marland Kitchen b complex vitamins capsule Take 1 capsule by mouth daily.     . bumetanide (BUMEX) 1 MG tablet Take 0.5 mg by mouth daily.  12  . carvedilol (COREG) 6.25 MG tablet Take 6.25 mg by mouth 2 (two) times daily with a meal.    . Cholecalciferol (VITAMIN D) 2000 units CAPS Take 1 capsule by mouth 2 (two) times daily.    . clopidogrel (PLAVIX) 75 MG tablet TAKE 1 TABLET BY MOUTH EVERY DAY TO PREVENT HEART ATTACK 30 tablet 6  . ESTRACE VAGINAL 0.1 MG/GM vaginal cream     . FREESTYLE LITE test strip TEST GLUCOSE ONCE DAILY AS DIRECTED 100 each 2  . gabapentin (NEURONTIN) 600 MG tablet TAKE 1 TABLET BY MOUTH AT BEDTIME FOR NEUROPATHY 90 tablet 1  . Lancets (FREESTYLE) lancets Check blood sugar 1 time daily-DX-R73.03 100 each 2  . losartan (COZAAR) 50 MG tablet Take 25 mg by mouth daily.     . magnesium gluconate (MAGONATE) 500 MG tablet Take 500 mg by mouth daily.     . ranitidine (ZANTAC) 300 MG tablet TAKE 1 TABLET TWICE A DAY FOR REFLUX 180 tablet 1   No current facility-administered medications for this visit.     Allergies as of 08/28/2017 - Review Complete 08/28/2017  Allergen Reaction Noted  . Nexium [esomeprazole magnesium] Diarrhea 10/16/2013    Vitals: BP (!) 164/82 (BP Location: Right Arm, Patient Position: Supine)   Pulse 64   Ht 5\' 3"  (1.6 m)   Wt 150 lb (68 kg)   BMI 26.57 kg/m  Last Weight:  Wt Readings from Last 1 Encounters:  08/28/17 150 lb (68 kg)   GBT:DVVO mass index is 26.57 kg/m.     Last Height:   Ht Readings from Last 1 Encounters:  08/28/17 5\' 3"  (1.6 m)    Physical exam:  General: The patient is awake, alert and appears not in acute  distress. The patient is well groomed. Head: Normocephalic, atraumatic. Neck is supple.  Cardiovascular:  Regular rate and rhythm, without  murmurs or carotid bruit, and without distended neck veins. Respiratory: Lungs are clear to auscultation. Skin:  Without evidence of edema, or rash Trunk: BMI is 27. The patient's posture is  erect, slightly stooped.   Neurologic exam : The patient is awake and alert, oriented to place and time.    Attention span & concentration ability appears normal.  Speech is fluent,  without dysarthria, dysphonia or aphasia.  Mood and affect are appropriate.  Cranial nerves: Pupils are equal and briskly reactive to light.  Extraocular movements  in vertical and horizontal planes intact and without nystagmus. Visual fields by finger perimetry are intact.Hearing to finger rub intact.  Facial sensation intact to fine touch. Facial motor strength is symmetric and tongue and uvula move midline. Shoulder shrug was symmetrical.   Motor exam:  Normal  muscle bulk and symmetric strength in all extremities. Sensory:  Fine touch, pinprick and vibration were tested in all extremities. Proprioception tested in the upper extremities was normal. Coordination: Rapid alternating movements in the fingers/hands was normal. Finger-to-nose maneuver  normal without evidence of ataxia, dysmetria or tremor. Gait and station: Patient walks without assistive device -Tandem gait is unfragmented. Turns with 3 Steps. Romberg testing is  negative.  Deep tendon reflexes: in the  upper and lower extremities are symmetric and intact.  CLINICAL DATA:  Dizziness. Syncopal episodes. Unable to get MRI due to AICD.  EXAM: CT HEAD WITHOUT AND WITH CONTRAST  TECHNIQUE: Contiguous axial images were obtained from the base of the skull through the vertex without and with intravenous contrast  CONTRAST:  48mL ISOVUE-300 IOPAMIDOL (ISOVUE-300) INJECTION 61%  COMPARISON:  CT head without contrast  06/23/2013  FINDINGS: Brain: Moderate atrophy and diffuse white matter disease is seen bilaterally. The basal ganglia are intact. No acute infarct, hemorrhage, or mass lesion is present. The insular ribbon is normal bilaterally. No focal cortical lesions are present. Remote lacunar infarcts are present in the cerebellum bilaterally. The brainstem is unremarkable.  Vascular: Atherosclerotic calcifications are present within the cavernous internal carotid artery is. There is no hyperdense vessel. Intracranial artery is enhance symmetrically.  Skull: The calvarium is intact. No focal lytic or blastic lesions are present.  Sinuses/Orbits: The paranasal sinuses and mastoid air cells are clear. Bilateral lens replacements are present. The globes and orbits are otherwise within normal limits.  IMPRESSION: 1. Moderate atrophy and diffuse white matter disease likely reflects the sequela of chronic microvascular ischemia. 2. No acute intracranial abnormality.   Electronically Signed   By: San Morelle M.D.   On: 07/16/2017 10:18  Danielle. [name] reviewed her head  CT with me, and I was impressed how well she is clinically and cognitively doing. Her spells are likely syncopal, not TIAs. She has a pacer, had no aura. Most near fainting spells were proceeded by bending or kneeling. Vasomotor . Not longer driving.   Assessment:  After physical and neurologic examination, review of laboratory studies,  Personal review of imaging studies, reports of other /same  Imaging studies, results of polysomnography and / or neurophysiology testing and pre-existing records as far as provided in visit., my assessment is   1) I would ask the patient to keep hydrating well. Take an extra 12 seconds before you rise from a seated position. Orthostatic BP in the cardiology office was normal.   I spent more than 30 minutes of face to face time with the patient.  Greater than 50% of time was spent in  counseling and coordination of care. We have discussed the diagnosis and differential and I answered the patient's questions.    Plan:  Treatment plan and additional workup - none    No Follow-up on file.  Larey Seat, MD 8/47/2072, 1:82 PM  Certified in Neurology by ABPN Certified in [place] by [facility] [address], [place],  [address]

## 2017-08-31 NOTE — Progress Notes (Signed)
This very nice 83 y.o. WWF presents for 6 month follow up with Hypertension, Hyperlipidemia, ASHD/AICD,  Pre-Diabetes and Vitamin D Deficiency. She also has GERD controlled with prudent diet & meds.      Patient is treated for HTN (1988 & BP has been controlled at home. Today's BP is at goal - 134/76. She had a PPM in 2004 - Replaced in 2008 with an AICD (defib). Stress Myoview in in 2010 was Normal/Negative. Patient has had no complaints of any cardiac type chest pain, palpitations, dyspnea/orthopnea/PND, claudication, or dependent edema. Patient does relate recent episodes of fainting , usu associated while standing w/o aura , tongue biting or incontinence. She seems to have brief pre and post amnesia for the episodes.  Sh's had her pacemaker checked bu Dr Beckie Salts and Dr Wynonia Lawman has her wearing an event monitor to r/o intermittent PPM failure. She's also been evaluated by Dr Brett Fairy who felt her episodes were not consistent with seizures and more consistent with postural issues recommending adequate hydration and slow standing precautions before ambulating. Patient also had a recent Head CT scan reviewed with Dr Brett Fairy.      Hyperlipidemia is not controlled with diet & meds deferred for age. Last Lipids were not at goal: Lab Results  Component Value Date   CHOL 222 (H) 05/30/2017   HDL 64 05/30/2017   LDLCALC 130 (H) 05/30/2017   TRIG 140 05/30/2017   CHOLHDL 3.5 05/30/2017      Also, the patient has history of PreDiabetes with A1c 6.0% in 2012 and she denies  symptoms of reactive hypoglycemia, diabetic polys or visual blurring.  She does take Gabapentin for burning paresthesias of her feet. Last A1c was at goal: Lab Results  Component Value Date   HGBA1C 5.6 02/17/2017      Further, the patient also has history of Vitamin D Deficiency of "33" in 2008 and supplements vitamin D without any suspected side-effects. Last vitamin D was at goal:  Lab Results  Component Value Date   VD25OH  67 02/17/2017   Current Outpatient Prescriptions on File Prior to Visit  Medication Sig  . aspirin EC 81 MG tablet Take 81 mg by mouth daily.  Marland Kitchen b complex vitamins capsule Take 1 capsule by mouth daily.   . bumetanide (BUMEX) 1 MG tablet Take 0.5 mg by mouth daily.  . carvedilol (COREG) 6.25 MG tablet Take 6.25 mg by mouth 2 (two) times daily with a meal.  . Cholecalciferol (VITAMIN D) 2000 units CAPS Take 1 capsule by mouth 2 (two) times daily.  . clopidogrel (PLAVIX) 75 MG tablet TAKE 1 TABLET BY MOUTH EVERY DAY TO PREVENT HEART ATTACK  . ESTRACE VAGINAL 0.1 MG/GM vaginal cream   . FREESTYLE LITE test strip TEST GLUCOSE ONCE DAILY AS DIRECTED  . gabapentin (NEURONTIN) 600 MG tablet TAKE 1 TABLET BY MOUTH AT BEDTIME FOR NEUROPATHY  . Lancets (FREESTYLE) lancets Check blood sugar 1 time daily-DX-R73.03  . losartan (COZAAR) 50 MG tablet Take 25 mg by mouth daily.   . magnesium gluconate (MAGONATE) 500 MG tablet Take 500 mg by mouth daily.   . ranitidine (ZANTAC) 300 MG tablet TAKE 1 TABLET TWICE A DAY FOR REFLUX   No current facility-administered medications on file prior to visit.    Allergies  Allergen Reactions  . Nexium [Esomeprazole Magnesium] Diarrhea   PMHx:   Past Medical History:  Diagnosis Date  . A-fib ([place])   . Biventricular cardiac pacemaker in situ  05/06/13  Old AICD explanted and 6949 lead capped  RV lead  Medtronic 5076 [id number]  LV lead  Medtronic 1308  [id number] V Medtronic biventricular pacer  PVX [id number]   . Diabetes mellitus without complication ([place])   . GERD (gastroesophageal reflux disease)   . History of duodenal ulcer   . History of esophageal stricture   . Hyperlipidemia   . Hypertension   . Occlusion and stenosis of carotid artery without mention of cerebral infarction   . Peripheral neuropathy   . Right groin pain 12/12/2014  . Secondary cardiomyopathy, unspecified   . Transient global amnesia    Immunization History  Administered Date(s)  Administered  . DT 10/03/2015  . Influenza, High Dose Seasonal PF 10/19/2013, 08/06/2016  . Influenza-Unspecified 10/08/2012, 02/03/2015  . Pneumococcal Conjugate-13 08/31/2014  . Pneumococcal-Unspecified 12/09/2002  . Td 12/09/2002  . Zoster 08/06/2016   Past Surgical History:  Procedure Laterality Date  . ABDOMINAL HYSTERECTOMY    . APPENDECTOMY    . BI-VENTRICULAR PACEMAKER UPGRADE N/A 05/05/2013   Procedure: BI-VENTRICULAR PACEMAKER UPGRADE;  Surgeon: Evans Lance, MD;  Location: [facility] CATH LAB;  Service: Cardiovascular;  Laterality: N/A;  . BIV PACEMAKER GENERATOR CHANGE OUT  05/05/2013   MDT CRTP upgrade (previously implanted dual chamber ICD) by Dr Lovena Le.  New RV/LV leads placed.   Marland Kitchen BREAST SURGERY Left 1985   Negative  Biopsy  . CARDIAC CATHETERIZATION     04/2003  . CARDIAC DEFIBRILLATOR PLACEMENT  09/2004   MDT ICD implanted for primary prevention; device upgrade to CRTP in 04-2013  . CARPAL TUNNEL RELEASE    . CHOLECYSTECTOMY     FHx:    Reviewed / unchanged  SHx:    Reviewed / unchanged  Systems Review:  Constitutional: Denies fever, chills, wt changes, headaches, insomnia, fatigue, night sweats, change in appetite. Eyes: Denies redness, blurred vision, diplopia, discharge, itchy, watery eyes.  ENT: Denies discharge, congestion, post nasal drip, epistaxis, sore throat, earache, hearing loss, dental pain, tinnitus, vertigo, sinus pain, snoring.  CV: Denies chest pain, palpitations, irregular heartbeat, syncope, dyspnea, diaphoresis, orthopnea, PND, claudication or edema. Respiratory: denies cough, dyspnea, DOE, pleurisy, hoarseness, laryngitis, wheezing.  Gastrointestinal: Denies dysphagia, odynophagia, heartburn, reflux, water brash, abdominal pain or cramps, nausea, vomiting, bloating, diarrhea, constipation, hematemesis, melena, hematochezia  or hemorrhoids. Genitourinary: Denies dysuria, frequency, urgency, nocturia, hesitancy, discharge, hematuria or flank  pain. Musculoskeletal: Denies arthralgias, myalgias, stiffness, jt. swelling, pain, limping or strain/sprain.  Skin: Denies pruritus, rash, hives, warts, acne, eczema or change in skin lesion(s). Neuro: No weakness, tremor, incoordination, spasms, paresthesia or pain. Psychiatric: Denies confusion, memory loss or sensory loss. Endo: Denies change in weight, skin or hair change.  Heme/Lymph: No excessive bleeding, bruising or enlarged lymph nodes.  Physical Exam  BP 134/76   Pulse 74   Temp (!) 97.5 F (36.4 C)   Resp 18   Ht 5\' 3"  (1.6 m)   Wt 149 lb 3.2 oz (67.7 kg)   BMI 26.43 kg/m   Appears well nourished, well groomed  and in no distress.  Eyes: PERRLA, EOMs, conjunctiva no swelling or erythema. Sinuses: No frontal/maxillary tenderness ENT/Mouth: EAC's clear, TM's nl w/o erythema, bulging. Nares clear w/o erythema, swelling, exudates. Oropharynx clear without erythema or exudates. Oral hygiene is good. Tongue normal, non obstructing. Hearing intact.  Neck: Supple. Thyroid nl. Car 2+/2+ without bruits, nodes or JVD. Chest: Respirations nl with BS clear & equal w/o rales, rhonchi, wheezing or stridor.  Cor: Heart sounds normal w/ regular  rate and rhythm without sig. murmurs, gallops, clicks or rubs. Peripheral pulses normal and equal  without edema.  Abdomen: Soft & bowel sounds normal. Non-tender w/o guarding, rebound, hernias, masses or organomegaly.  Lymphatics: Unremarkable.  Musculoskeletal: Full ROM all peripheral extremities, joint stability, 5/5 strength and normal gait.  Skin: Warm, dry without exposed rashes, lesions or ecchymosis apparent.  Neuro: Cranial nerves intact, reflexes equal bilaterally. Sensory-motor testing grossly intact. Tendon reflexes grossly intact.  Pysch: Alert & oriented x 3.  Insight and judgement nl & appropriate. No ideations.  Assessment and Plan:  1. Essential hypertension  - Continue medication, monitor blood pressure at home.  - Continue  DASH diet. Reminder to go to the ER if any CP,  SOB, nausea, dizziness, severe HA, changes vision/speech.  - CBC with Differential/Platelet - BASIC METABOLIC PANEL WITH GFR - Magnesium - Lipid panel  2. Hyperlipidemia, mixed  - Continue diet/meds, exercise,& lifestyle modifications.  - Continue monitor periodic cholesterol/liver & renal functions   - Hepatic function panel - Lipid panel  3. Prediabetes  - Continue diet, exercise, lifestyle modifications.  - Monitor appropriate labs.  - TSH - Hemoglobin A1c  4. Vitamin D deficiency  - Continue supplementation.  - Insulin, fasting  5. ASHD (arteriosclerotic heart disease)  - VITAMIN D 25 Hydroxy   6. Biventricular cardiac pacemaker in situ   7. Medication management  - CBC with Differential/Platelet - BASIC METABOLIC PANEL WITH GFR - Hepatic function panel - Magnesium - Lipid panel - TSH - Hemoglobin A1c - Insulin, fasting - VITAMIN D 25 Hydroxy        Discussed  regular exercise, BP monitoring, weight control to achieve/maintain BMI less than 25 and discussed med and SE's. Recommended labs to assess and monitor clinical status with further disposition pending results of labs. Over 30 minutes of exam, counseling, chart review was performed.

## 2017-08-31 NOTE — Patient Instructions (Signed)

## 2017-09-01 ENCOUNTER — Ambulatory Visit (INDEPENDENT_AMBULATORY_CARE_PROVIDER_SITE_OTHER): Payer: Medicare Other | Admitting: Internal Medicine

## 2017-09-01 ENCOUNTER — Encounter: Payer: Self-pay | Admitting: Internal Medicine

## 2017-09-01 VITALS — BP 134/76 | HR 74 | Temp 97.5°F | Resp 18 | Ht 63.0 in | Wt 149.2 lb

## 2017-09-01 DIAGNOSIS — E782 Mixed hyperlipidemia: Secondary | ICD-10-CM

## 2017-09-01 DIAGNOSIS — Z79899 Other long term (current) drug therapy: Secondary | ICD-10-CM | POA: Diagnosis not present

## 2017-09-01 DIAGNOSIS — I10 Essential (primary) hypertension: Secondary | ICD-10-CM | POA: Diagnosis not present

## 2017-09-01 DIAGNOSIS — I2510 Atherosclerotic heart disease of native coronary artery without angina pectoris: Secondary | ICD-10-CM

## 2017-09-01 DIAGNOSIS — R7303 Prediabetes: Secondary | ICD-10-CM | POA: Diagnosis not present

## 2017-09-01 DIAGNOSIS — E559 Vitamin D deficiency, unspecified: Secondary | ICD-10-CM | POA: Diagnosis not present

## 2017-09-01 DIAGNOSIS — Z950 Presence of cardiac pacemaker: Secondary | ICD-10-CM | POA: Diagnosis not present

## 2017-09-02 LAB — HEPATIC FUNCTION PANEL
AG Ratio: 1.8 (calc) (ref 1.0–2.5)
ALKALINE PHOSPHATASE (APISO): 54 U/L (ref 33–130)
ALT: 9 U/L (ref 6–29)
AST: 17 U/L (ref 10–35)
Albumin: 4.1 g/dL (ref 3.6–5.1)
BILIRUBIN INDIRECT: 0.4 mg/dL (ref 0.2–1.2)
Bilirubin, Direct: 0.1 mg/dL (ref 0.0–0.2)
GLOBULIN: 2.3 g/dL (ref 1.9–3.7)
TOTAL PROTEIN: 6.4 g/dL (ref 6.1–8.1)
Total Bilirubin: 0.5 mg/dL (ref 0.2–1.2)

## 2017-09-02 LAB — CBC WITH DIFFERENTIAL/PLATELET
BASOS PCT: 0.6 %
Basophils Absolute: 47 cells/uL (ref 0–200)
EOS ABS: 198 {cells}/uL (ref 15–500)
Eosinophils Relative: 2.5 %
HCT: 39 % (ref 35.0–45.0)
Hemoglobin: 13.5 g/dL (ref 11.7–15.5)
Lymphs Abs: 2267 cells/uL (ref 850–3900)
MCH: 31.1 pg (ref 27.0–33.0)
MCHC: 34.6 g/dL (ref 32.0–36.0)
MCV: 89.9 fL (ref 80.0–100.0)
MONOS PCT: 6.8 %
MPV: 11.6 fL (ref 7.5–12.5)
NEUTROS PCT: 61.4 %
Neutro Abs: 4851 cells/uL (ref 1500–7800)
PLATELETS: 262 10*3/uL (ref 140–400)
RBC: 4.34 10*6/uL (ref 3.80–5.10)
RDW: 12.4 % (ref 11.0–15.0)
TOTAL LYMPHOCYTE: 28.7 %
WBC mixed population: 537 cells/uL (ref 200–950)
WBC: 7.9 10*3/uL (ref 3.8–10.8)

## 2017-09-02 LAB — BASIC METABOLIC PANEL WITH GFR
BUN / CREAT RATIO: 14 (calc) (ref 6–22)
BUN: 18 mg/dL (ref 7–25)
CHLORIDE: 105 mmol/L (ref 98–110)
CO2: 28 mmol/L (ref 20–32)
Calcium: 9.2 mg/dL (ref 8.6–10.4)
Creat: 1.29 mg/dL — ABNORMAL HIGH (ref 0.60–0.88)
GFR, EST AFRICAN AMERICAN: 43 mL/min/{1.73_m2} — AB (ref >=60)
GFR, Est Non African American: 37 mL/min/{1.73_m2} — ABNORMAL LOW (ref >=60)
GLUCOSE: 90 mg/dL (ref 65–99)
POTASSIUM: 4.5 mmol/L (ref 3.5–5.3)
SODIUM: 141 mmol/L (ref 135–146)

## 2017-09-02 LAB — INSULIN, RANDOM: Insulin: 4.6 u[IU]/mL (ref 2.0–19.6)

## 2017-09-02 LAB — TSH: TSH: 1.83 m[IU]/L (ref 0.40–4.50)

## 2017-09-02 LAB — LIPID PANEL
CHOLESTEROL: 219 mg/dL — AB (ref <200)
HDL: 53 mg/dL (ref >50)
LDL Cholesterol (Calc): 133 mg/dL (calc) — ABNORMAL HIGH
Non-HDL Cholesterol (Calc): 166 mg/dL (calc) — ABNORMAL HIGH (ref <130)
Total CHOL/HDL Ratio: 4.1 (calc) (ref <5.0)
Triglycerides: 190 mg/dL — ABNORMAL HIGH (ref <150)

## 2017-09-02 LAB — HEMOGLOBIN A1C
EAG (MMOL/L): 6.3 (calc)
Hgb A1c MFr Bld: 5.6 % of total Hgb (ref <5.7)
Mean Plasma Glucose: 114 (calc)

## 2017-09-02 LAB — MAGNESIUM: MAGNESIUM: 1.9 mg/dL (ref 1.5–2.5)

## 2017-09-02 LAB — VITAMIN D 25 HYDROXY (VIT D DEFICIENCY, FRACTURES): VIT D 25 HYDROXY: 61 ng/mL (ref 30–100)

## 2017-09-05 DIAGNOSIS — Z8673 Personal history of transient ischemic attack (TIA), and cerebral infarction without residual deficits: Secondary | ICD-10-CM | POA: Diagnosis not present

## 2017-09-05 DIAGNOSIS — R55 Syncope and collapse: Secondary | ICD-10-CM | POA: Diagnosis not present

## 2017-09-05 DIAGNOSIS — I119 Hypertensive heart disease without heart failure: Secondary | ICD-10-CM | POA: Diagnosis not present

## 2017-09-05 DIAGNOSIS — K219 Gastro-esophageal reflux disease without esophagitis: Secondary | ICD-10-CM | POA: Diagnosis not present

## 2017-09-05 DIAGNOSIS — I6529 Occlusion and stenosis of unspecified carotid artery: Secondary | ICD-10-CM | POA: Diagnosis not present

## 2017-09-05 DIAGNOSIS — Z950 Presence of cardiac pacemaker: Secondary | ICD-10-CM | POA: Diagnosis not present

## 2017-09-05 DIAGNOSIS — R0609 Other forms of dyspnea: Secondary | ICD-10-CM | POA: Diagnosis not present

## 2017-09-05 DIAGNOSIS — E785 Hyperlipidemia, unspecified: Secondary | ICD-10-CM | POA: Diagnosis not present

## 2017-09-05 DIAGNOSIS — I951 Orthostatic hypotension: Secondary | ICD-10-CM | POA: Diagnosis not present

## 2017-09-06 ENCOUNTER — Emergency Department (HOSPITAL_COMMUNITY)
Admission: EM | Admit: 2017-09-06 | Discharge: 2017-09-06 | Disposition: A | Discharge status: Home / Self Care | Payer: Medicare Other | Attending: Emergency Medicine | Admitting: Emergency Medicine

## 2017-09-06 ENCOUNTER — Emergency Department (HOSPITAL_COMMUNITY): Payer: Medicare Other

## 2017-09-06 ENCOUNTER — Encounter (HOSPITAL_COMMUNITY): Payer: Self-pay | Admitting: *Deleted

## 2017-09-06 DIAGNOSIS — I5022 Chronic systolic (congestive) heart failure: Secondary | ICD-10-CM | POA: Diagnosis not present

## 2017-09-06 DIAGNOSIS — Z950 Presence of cardiac pacemaker: Secondary | ICD-10-CM | POA: Diagnosis not present

## 2017-09-06 DIAGNOSIS — Z9049 Acquired absence of other specified parts of digestive tract: Secondary | ICD-10-CM | POA: Insufficient documentation

## 2017-09-06 DIAGNOSIS — R0602 Shortness of breath: Secondary | ICD-10-CM | POA: Diagnosis not present

## 2017-09-06 DIAGNOSIS — I130 Hypertensive heart and chronic kidney disease with heart failure and stage 1 through stage 4 chronic kidney disease, or unspecified chronic kidney disease: Secondary | ICD-10-CM | POA: Diagnosis not present

## 2017-09-06 DIAGNOSIS — E860 Dehydration: Secondary | ICD-10-CM | POA: Insufficient documentation

## 2017-09-06 DIAGNOSIS — R531 Weakness: Secondary | ICD-10-CM | POA: Diagnosis not present

## 2017-09-06 DIAGNOSIS — R404 Transient alteration of awareness: Secondary | ICD-10-CM | POA: Diagnosis not present

## 2017-09-06 DIAGNOSIS — Z7982 Long term (current) use of aspirin: Secondary | ICD-10-CM | POA: Diagnosis not present

## 2017-09-06 DIAGNOSIS — Z79899 Other long term (current) drug therapy: Secondary | ICD-10-CM | POA: Diagnosis not present

## 2017-09-06 DIAGNOSIS — N183 Chronic kidney disease, stage 3 (moderate): Secondary | ICD-10-CM | POA: Diagnosis not present

## 2017-09-06 DIAGNOSIS — N3000 Acute cystitis without hematuria: Secondary | ICD-10-CM

## 2017-09-06 DIAGNOSIS — G459 Transient cerebral ischemic attack, unspecified: Secondary | ICD-10-CM | POA: Diagnosis not present

## 2017-09-06 DIAGNOSIS — R42 Dizziness and giddiness: Secondary | ICD-10-CM | POA: Diagnosis present

## 2017-09-06 DIAGNOSIS — E1122 Type 2 diabetes mellitus with diabetic chronic kidney disease: Secondary | ICD-10-CM | POA: Diagnosis not present

## 2017-09-06 LAB — COMPREHENSIVE METABOLIC PANEL
ALT: 11 U/L — ABNORMAL LOW (ref 14–54)
AST: 32 U/L (ref 15–41)
Albumin: 3.8 g/dL (ref 3.5–5.0)
Alkaline Phosphatase: 47 U/L (ref 38–126)
Anion gap: 10 (ref 5–15)
BUN: 16 mg/dL (ref 6–20)
CHLORIDE: 107 mmol/L (ref 101–111)
CO2: 23 mmol/L (ref 22–32)
Calcium: 9.2 mg/dL (ref 8.9–10.3)
Creatinine, Ser: 1.37 mg/dL — ABNORMAL HIGH (ref 0.44–1.00)
GFR calc Af Amer: 38 mL/min — ABNORMAL LOW (ref >60)
GFR, EST NON AFRICAN AMERICAN: 33 mL/min — AB (ref >60)
Glucose, Bld: 102 mg/dL — ABNORMAL HIGH (ref 65–99)
POTASSIUM: 4.7 mmol/L (ref 3.5–5.1)
SODIUM: 140 mmol/L (ref 135–145)
Total Bilirubin: 1.1 mg/dL (ref 0.3–1.2)
Total Protein: 5.9 g/dL — ABNORMAL LOW (ref 6.5–8.1)

## 2017-09-06 LAB — CBC WITH DIFFERENTIAL/PLATELET
BASOS ABS: 0 10*3/uL (ref 0.0–0.1)
BASOS PCT: 0 %
EOS ABS: 0.2 10*3/uL (ref 0.0–0.7)
EOS PCT: 2 %
HCT: 37.6 % (ref 36.0–46.0)
Hemoglobin: 12.9 g/dL (ref 12.0–15.0)
Lymphocytes Relative: 19 %
Lymphs Abs: 2 10*3/uL (ref 0.7–4.0)
MCH: 31.1 pg (ref 26.0–34.0)
MCHC: 34.3 g/dL (ref 30.0–36.0)
MCV: 90.6 fL (ref 78.0–100.0)
MONO ABS: 0.6 10*3/uL (ref 0.1–1.0)
MONOS PCT: 6 %
NEUTROS ABS: 7.9 10*3/uL — AB (ref 1.7–7.7)
Neutrophils Relative %: 73 %
PLATELETS: 232 10*3/uL (ref 150–400)
RBC: 4.15 MIL/uL (ref 3.87–5.11)
RDW: 12.6 % (ref 11.5–15.5)
WBC: 10.7 10*3/uL — ABNORMAL HIGH (ref 4.0–10.5)

## 2017-09-06 LAB — URINALYSIS, ROUTINE W REFLEX MICROSCOPIC
BILIRUBIN URINE: NEGATIVE
GLUCOSE, UA: NEGATIVE mg/dL
Hgb urine dipstick: NEGATIVE
KETONES UR: NEGATIVE mg/dL
NITRITE: POSITIVE — AB
PH: 7 (ref 5.0–8.0)
Protein, ur: NEGATIVE mg/dL
Specific Gravity, Urine: 1.005 (ref 1.005–1.030)
Squamous Epithelial / LPF: NONE SEEN

## 2017-09-06 LAB — I-STAT TROPONIN, ED: TROPONIN I, POC: 0 ng/mL (ref 0.00–0.08)

## 2017-09-06 MED ORDER — CEPHALEXIN 500 MG PO CAPS: 500.0000 mg | ORAL_CAPSULE | Freq: Three times a day (TID) | ORAL | 0 refills | Status: DC

## 2017-09-06 MED ORDER — DEXTROSE 5 % IV SOLN
1.0000 g | Freq: Once | INTRAVENOUS | Status: AC
  Administered 2017-09-06: 1 g via INTRAVENOUS
  Filled 2017-09-06: qty 10

## 2017-09-06 MED ORDER — SODIUM CHLORIDE 0.9 % IV BOLUS (SEPSIS)
1000.0000 mL | Freq: Once | INTRAVENOUS | Status: AC
  Administered 2017-09-06: 1000 mL via INTRAVENOUS

## 2017-09-06 NOTE — ED Notes (Signed)
Bladder scan 759ml

## 2017-09-06 NOTE — ED Provider Notes (Signed)
[facility] DEPT Provider Note   CSN: [id number] Arrival date & time: 09/06/17  1018     History   Chief Complaint Chief Complaint  Patient presents with  . Weakness    HPI [name] is a 83 y.o. female history of A. Fib not blood thinners, pacemaker, diabetes here presenting with dizziness, weakness. Patient has ongoing weakness for the last several weeks. Patient was seen by neurology it was thought to have presyncope or TIA. Patient had recent CT head with and without contrast that was unremarkable. Patient states that she got up this morning and was about to go eat breakfast. She decided to walk down the hallway to pick up her newspaper and then felt very lightheaded and dizzy and diffusely weak. She called one of her friends, who sat her down and she felt better. Denies room spinning. Denies trouble speaking. Denies urinary symptoms or fever or vomiting or cough. Didn't eat breakfast this morning.   The history is provided by the patient.    Past Medical History:  Diagnosis Date  . A-fib ([place])   . Biventricular cardiac pacemaker in situ    05/06/13  Old AICD explanted and 6949 lead capped  RV lead  Medtronic 5076 [id number]  LV lead  Medtronic 4194  [id number] V Medtronic biventricular pacer  PVX [id number] S   . Diabetes mellitus without complication ([place])   . GERD (gastroesophageal reflux disease)   . History of duodenal ulcer   . History of esophageal stricture   . Hyperlipidemia   . Hypertension   . Occlusion and stenosis of carotid artery without mention of cerebral infarction   . Peripheral neuropathy   . Right groin pain 12/12/2014  . Secondary cardiomyopathy, unspecified   . Transient global amnesia     Patient Active Problem List   Diagnosis Date Noted  . Postural dizziness with near syncope 08/28/2017  . Encounter for Medicare annual wellness exam 10/03/2015  . ASHD (arteriosclerotic heart disease) 12/05/2014  . GERD (gastroesophageal reflux disease)  12/05/2014  . Prediabetes 05/21/2014  . Vitamin D deficiency 05/20/2014  . Medication management 05/20/2014  . CKD (chronic kidney disease), stage III 06/21/2013  . History of vasodepressor syncope   . Biventricular cardiac pacemaker in situ   . Mixed hyperlipidemia   . Hypertension   . LBBB   . Chronic systolic heart failure ([place])   . Dilated cardiomyopathy, nonischemic     Past Surgical History:  Procedure Laterality Date  . ABDOMINAL HYSTERECTOMY    . APPENDECTOMY    . BI-VENTRICULAR PACEMAKER UPGRADE N/A 05/05/2013   Procedure: BI-VENTRICULAR PACEMAKER UPGRADE;  Surgeon: Evans Lance, MD;  Location: [facility] CATH LAB;  Service: Cardiovascular;  Laterality: N/A;  . BIV PACEMAKER GENERATOR CHANGE OUT  05/05/2013   MDT CRTP upgrade (previously implanted dual chamber ICD) by Dr Lovena Le.  New RV/LV leads placed.   Marland Kitchen BREAST SURGERY Left 1985   Negative  Biopsy  . CARDIAC CATHETERIZATION     04/2003  . CARDIAC DEFIBRILLATOR PLACEMENT  09/2004   MDT ICD implanted for primary prevention; device upgrade to CRTP in 04-2013  . CARPAL TUNNEL RELEASE    . CHOLECYSTECTOMY      OB History    No data available       Home Medications    Prior to Admission medications   Medication Sig Start Date End Date Taking? Authorizing Provider  aspirin EC 81 MG tablet Take 81 mg by mouth daily.   Yes [provider]  b complex vitamins capsule Take 1 capsule by mouth daily.    Yes [provider]  bumetanide (BUMEX) 1 MG tablet Take 0.5 mg by mouth daily. 05/03/15  Yes [provider]  carvedilol (COREG) 6.25 MG tablet Take 6.25 mg by mouth 2 (two) times daily with a meal.   Yes [provider]  Cholecalciferol (VITAMIN D) 2000 units CAPS Take 4,000 Units by mouth daily.    Yes [provider]  clopidogrel (PLAVIX) 75 MG tablet TAKE 1 TABLET BY MOUTH EVERY DAY TO PREVENT HEART ATTACK 05/08/17  Yes Unk Pinto, MD  gabapentin (NEURONTIN) 600 MG tablet TAKE  1 TABLET BY MOUTH AT BEDTIME FOR NEUROPATHY 04/19/17  Yes Unk Pinto, MD  losartan (COZAAR) 50 MG tablet Take 25 mg by mouth daily.    Yes [provider]  magnesium gluconate (MAGONATE) 500 MG tablet Take 500 mg by mouth daily.    Yes [provider]  ranitidine (ZANTAC) 300 MG tablet TAKE 1 TABLET TWICE A DAY FOR REFLUX 12/02/16  Yes Unk Pinto, MD    Family History Family History  Problem Relation Age of Onset  . Dementia Mother   . Diabetes Mother   . Heart Problems Father   . Heart attack Son   . Heart disease Son   . Heart disease Brother   . Heart attack Brother   . Cancer Maternal Grandmother        breast  . Stroke Daughter   . Hypertension Daughter   . Hyperlipidemia Daughter   . Diabetes Daughter   . Diabetes Sister   . Hypertension Sister   . Hyperlipidemia Sister   . Hypertension Sister   . Hyperlipidemia Sister   . Cancer Sister        Breast  . Heart Problems Sister     Social History Social History  Substance Use Topics  . Smoking status: Passive Smoke Exposure - Never Smoker  . Smokeless tobacco: Never Used     Comment: worked at [employer]  . Alcohol use No     Allergies   Nexium [esomeprazole magnesium]   Review of Systems Review of Systems  Neurological: Positive for weakness.  All other systems reviewed and are negative.    Physical Exam Updated Vital Signs BP (!) 161/59 (BP Location: Right Arm)   Pulse 64   Temp 98 F (36.7 C) (Oral)   Resp 20   Ht 5\' 3"  (1.6 m)   Wt 67.6 kg (149 lb)   SpO2 98%   BMI 26.39 kg/m   Physical Exam  Constitutional: She is oriented to person, place, and time.  Chronically ill, slightly dehydrated   HENT:  Head: Normocephalic.  MM slightly dry   Eyes: Pupils are equal, round, and reactive to light. Conjunctivae and EOM are normal.  Neck: Normal range of motion. Neck supple.  Cardiovascular: Normal rate, regular rhythm and normal heart sounds.   Pulmonary/Chest:  Effort normal and breath sounds normal. No respiratory distress. She has no wheezes. She has no rales.  Abdominal: Soft. Bowel sounds are normal. She exhibits no distension. There is no tenderness. There is no guarding.  Musculoskeletal: Normal range of motion. She exhibits no edema.  Neurological: She is alert and oriented to person, place, and time.  CN 2-12 intact. Nl strength throughout. Nl sensation throughout.   Skin: Skin is warm.  Psychiatric: She has a normal mood and affect. Her behavior is normal.  Nursing note and vitals reviewed.  ED Treatments / Results  Labs (all labs ordered are listed, but only abnormal results are displayed) Labs Reviewed  CBC WITH DIFFERENTIAL/PLATELET  COMPREHENSIVE METABOLIC PANEL  URINALYSIS, ROUTINE W REFLEX MICROSCOPIC  I-STAT TROPONIN, ED    EKG  EKG Interpretation  Date/Time:  Saturday September 06 2017 10:31:18 EDT Ventricular Rate:  66 PR Interval:    QRS Duration: 138 QT Interval:  493 QTC Calculation: 517 R Axis:   -73 Text Interpretation:  v paced  IVCD, consider atypical RBBB LVH with secondary repolarization abnormality Anterolateral infarct, age indeterminate Confirmed by Wandra Arthurs [id number]) on 09/06/2017 10:55:45 AM Also confirmed by Wandra Arthurs [phone]), editor Drema Pry [id number])  on 09/06/2017 10:59:36 AM       Radiology No results found.  Procedures Procedures (including critical care time)  Medications Ordered in ED Medications  sodium chloride 0.9 % bolus 1,000 mL (not administered)     Initial Impression / Assessment and Plan / ED Course  I have reviewed the triage vital signs and the nursing notes.  Pertinent labs & imaging results that were available during my care of the patient were reviewed by me and considered in my medical decision making (see chart for details).    MAGHEN GROUP is a 83 y.o. female here with weakness. This has been going on for several weeks. Saw PCP and neurology  recently and was thought to have near syncope. No chest pain and no vomiting. Didn't eat this morning so there may be a component of dehydration. Will check labs, CT head, UA, CXR. Will get orthostatic and reassess.   4:09 PM Labs showed mild AKI. CT head unremarkable. Borderline orthostatic initially, given 1 L NS bolus. Unable to urinate so in and out cath done and showed + UTI. Given rocephin, will dc home with keflex. Suspect UTI and mild dehydration causing her worsening weakness. I doubt stroke.     Final Clinical Impressions(s) / ED Diagnoses   Final diagnoses:  None    New Prescriptions New Prescriptions   No medications on file     Drenda Freeze, MD 09/06/17 1610

## 2017-09-06 NOTE — ED Notes (Signed)
Transported to CT Scan

## 2017-09-06 NOTE — ED Notes (Signed)
Pt returned from XR and CT

## 2017-09-06 NOTE — ED Triage Notes (Signed)
To ED via [facility] from home for eval of ongoing weakness. This weakness is intermittent over the last couple of weeks. This weakness today did not resolve like it has prior. Alert and oriented on arrival.

## 2017-09-06 NOTE — Discharge Instructions (Signed)
Stay hydrated.   Take keflex three times daily for a week for urinary tract infection.   See your doctor this week   Return to ER if you have worse dizziness, weakness, passing out, chest pain, trouble speaking

## 2017-09-08 ENCOUNTER — Telehealth: Payer: Self-pay | Admitting: *Deleted

## 2017-09-08 LAB — URINE CULTURE: Culture: >=100000 — AB

## 2017-09-08 NOTE — Telephone Encounter (Signed)
Patient called and states she was given Keflex at the ED for a UTI.  She also reports she has a red area at the site of her IV she received in the ED.  She says the area is red,but not warm or painful.  Per Dr Melford Aase, the patient was advised the Keflex should also help with the red area.

## 2017-09-09 ENCOUNTER — Telehealth: Payer: Self-pay | Admitting: Emergency Medicine

## 2017-09-09 NOTE — Telephone Encounter (Signed)
Post ED Visit - Positive Culture Follow-up  Culture report reviewed by antimicrobial stewardship pharmacist:  []  Elenor Quinones, Pharm.D. []  Heide Guile, Pharm.D., BCPS AQ-ID []  Parks Neptune, Pharm.D., BCPS []  Alycia Rossetti, Pharm.D., BCPS []  Sigel, [place].D., BCPS, AAHIVP []  Legrand Como, Pharm.D., BCPS, AAHIVP []  Salome Arnt, PharmD, BCPS []  Dimitri Ped, PharmD, BCPS []  Vincenza Hews, PharmD, BCPS  Positive urine culture Treated with cephalexin, organism sensitive to the same and no further patient follow-up is required at this time.  Hazle Nordmann 09/09/2017, 3:53 PM

## 2017-09-12 DIAGNOSIS — I6529 Occlusion and stenosis of unspecified carotid artery: Secondary | ICD-10-CM | POA: Diagnosis not present

## 2017-09-12 DIAGNOSIS — I951 Orthostatic hypotension: Secondary | ICD-10-CM | POA: Diagnosis not present

## 2017-09-12 DIAGNOSIS — Z950 Presence of cardiac pacemaker: Secondary | ICD-10-CM | POA: Diagnosis not present

## 2017-09-12 DIAGNOSIS — R55 Syncope and collapse: Secondary | ICD-10-CM | POA: Diagnosis not present

## 2017-09-12 DIAGNOSIS — R0609 Other forms of dyspnea: Secondary | ICD-10-CM | POA: Diagnosis not present

## 2017-09-12 DIAGNOSIS — Z8673 Personal history of transient ischemic attack (TIA), and cerebral infarction without residual deficits: Secondary | ICD-10-CM | POA: Diagnosis not present

## 2017-09-12 DIAGNOSIS — I119 Hypertensive heart disease without heart failure: Secondary | ICD-10-CM | POA: Diagnosis not present

## 2017-09-12 DIAGNOSIS — K219 Gastro-esophageal reflux disease without esophagitis: Secondary | ICD-10-CM | POA: Diagnosis not present

## 2017-09-12 DIAGNOSIS — E785 Hyperlipidemia, unspecified: Secondary | ICD-10-CM | POA: Diagnosis not present

## 2017-09-30 DIAGNOSIS — H35363 Drusen (degenerative) of macula, bilateral: Secondary | ICD-10-CM | POA: Diagnosis not present

## 2017-09-30 DIAGNOSIS — E119 Type 2 diabetes mellitus without complications: Secondary | ICD-10-CM | POA: Diagnosis not present

## 2017-09-30 LAB — HM DIABETES EYE EXAM

## 2017-10-07 ENCOUNTER — Encounter: Payer: Self-pay | Admitting: Internal Medicine

## 2017-10-22 ENCOUNTER — Other Ambulatory Visit: Payer: Self-pay

## 2017-10-22 MED ORDER — GABAPENTIN 600 MG PO TABS: ORAL_TABLET | ORAL | 1 refills | Status: DC

## 2017-10-28 DIAGNOSIS — I951 Orthostatic hypotension: Secondary | ICD-10-CM | POA: Diagnosis not present

## 2017-10-28 DIAGNOSIS — R55 Syncope and collapse: Secondary | ICD-10-CM | POA: Diagnosis not present

## 2017-10-28 DIAGNOSIS — E785 Hyperlipidemia, unspecified: Secondary | ICD-10-CM | POA: Diagnosis not present

## 2017-10-28 DIAGNOSIS — Z950 Presence of cardiac pacemaker: Secondary | ICD-10-CM | POA: Diagnosis not present

## 2017-10-28 DIAGNOSIS — K219 Gastro-esophageal reflux disease without esophagitis: Secondary | ICD-10-CM | POA: Diagnosis not present

## 2017-10-28 DIAGNOSIS — I119 Hypertensive heart disease without heart failure: Secondary | ICD-10-CM | POA: Diagnosis not present

## 2017-10-28 DIAGNOSIS — R0609 Other forms of dyspnea: Secondary | ICD-10-CM | POA: Diagnosis not present

## 2017-10-28 DIAGNOSIS — I6529 Occlusion and stenosis of unspecified carotid artery: Secondary | ICD-10-CM | POA: Diagnosis not present

## 2017-10-28 DIAGNOSIS — Z8673 Personal history of transient ischemic attack (TIA), and cerebral infarction without residual deficits: Secondary | ICD-10-CM | POA: Diagnosis not present

## 2017-11-03 ENCOUNTER — Encounter: Payer: Self-pay | Admitting: Podiatry

## 2017-11-03 ENCOUNTER — Ambulatory Visit (INDEPENDENT_AMBULATORY_CARE_PROVIDER_SITE_OTHER): Payer: Medicare Other | Admitting: Podiatry

## 2017-11-03 DIAGNOSIS — B351 Tinea unguium: Secondary | ICD-10-CM | POA: Diagnosis not present

## 2017-11-03 DIAGNOSIS — M79676 Pain in unspecified toe(s): Secondary | ICD-10-CM | POA: Diagnosis not present

## 2017-11-03 NOTE — Progress Notes (Signed)
Subjective: 83  y.o. returns the office today for painful, elongated, thickened toenails which she cannot trim herself. Denies any redness or drainage around the nails. Denies any acute changes since last appointment and no new complaints today. Denies any systemic complaints such as fevers, chills, nausea, vomiting.   Objective: AAO 3, NAD DP/PT pulses palpable, CRT less than 3 seconds Nails hypertrophic, dystrophic, elongated, brittle, discolored 10. There is tenderness overlying the nails 1-5 bilaterally with the right hallux the worst. There is no surrounding erythema or drainage along the nail sites. No open lesions or pre-ulcerative lesions are identified. No other areas of tenderness bilateral lower extremities. No overlying edema, erythema, increased warmth. No pain with calf compression, swelling, warmth, erythema.  Assessment: Patient presents with symptomatic onychomycosis  Plan: -Treatment options including alternatives, risks, complications were discussed -Nails sharply debrided 10 without complication/bleeding.  -Discussed coconut oil/tea tree oil mix for the nails.  -Discussed daily foot inspection. If there are any changes, to call the office immediately.  -Follow-up in 3 months or sooner if any problems are to arise. In the meantime, encouraged to call the office with any questions, concerns, changes symptoms.  Celesta Gentile, DPM

## 2017-11-03 NOTE — Patient Instructions (Signed)
You can mix 3 tablespoons of cocoanut oil and 10-15 drops of tea tree oil together. Apply to nails once a day.   Have a great week!!!

## 2017-12-08 NOTE — Progress Notes (Deleted)
FOLLOW UP  Assessment and Plan:   Hypertension Well controlled with current medications  Monitor blood pressure at home; patient to call if consistently greater than 130/80 Continue DASH diet.   Reminder to go to the ER if any CP, SOB, nausea, dizziness, severe HA, changes vision/speech, left arm numbness and tingling and jaw pain.  CHF Disease process and medications discussed. Questions answered fully. Emphasized salt restriction, less than 2000mg  a day. Encouraged daily monitoring of the patient's weight, call office if 5 lb weight loss or gain in a day.  Encouraged regular exercise. If any increasing shortness of breath, swelling, or chest pressure go to ER immediately.  decrease your fluid intake to less than 2 L daily please remember to always increase your potassium intake with any increase of your fluid pill.   Cholesterol No longer on medications secondary to age; above goal LDLs - continue to encourage lifestyle adherence Continue low cholesterol diet and exercise.  Check lipid panel.   CKD stage 3 Increase fluids, avoid NSAIDS, monitor sugars, will monitor  Prediabetes Recently well controlled with A1Cs normalized Continue diet and exercise.  Perform daily foot/skin check, notify office of any concerning changes.  Defer A1C to q6 months  Vitamin D Def At goal at last visit; continue supplementation to maintain goal of 70-100 Defer Vit D level  Continue diet and meds as discussed. Further disposition pending results of labs. Discussed med's effects and SE's.   Over 30 minutes of exam, counseling, chart review, and critical decision making was performed.   Future Appointments  Date Time Provider [place]  12/10/2017  9:30 AM Liane Comber, NP [facility] None  01/30/2018 10:00 AM Evans Lance, MD [facility]  02/02/2018  8:45 AM Trula Slade, DPM [facility]  03/11/2018  9:00 AM Unk Pinto, MD [facility] None     ----------------------------------------------------------------------------------------------------------------------  HPI 83 y.o. female  presents for 3 month follow up on CHF, hypertension, cholesterol, prediabetes and vitamin D deficiency.   She has a history of {type of heart failure:30421350}, denies {BlankSingle:19196::"dyspnea on exertion","orthopnea","paroxysmal nocturnal dyspnea","edema"}. Positive for {CARDIAC SYMPTOMS:12860}. Wt Readings from Last 3 Encounters:  09/06/17 149 lb (67.6 kg)  09/01/17 149 lb 3.2 oz (67.7 kg)  08/28/17 150 lb (68 kg)   Her blood pressure {HAS HAS NOT:18834} been controlled at home, today their BP is    She {DOES_DOES UVO:53664} workout. She denies chest pain, shortness of breath, dizziness.   She is not on cholesterol medication secondary to age; managed by lifestyle recommendations only. Her cholesterol is not at goal. The cholesterol last visit was:   Lab Results  Component Value Date   CHOL 219 (H) 09/01/2017   HDL 53 09/01/2017   LDLCALC 130 (H) 05/30/2017   TRIG 190 (H) 09/01/2017   CHOLHDL 4.1 09/01/2017    She {Has/has not:18111} been working on diet and exercise for prediabetes, and denies {Symptoms; diabetes w/o none:19199}. Last A1C in the office was improved and in normal range:  Lab Results  Component Value Date   HGBA1C 5.6 09/01/2017   Patient is on Vitamin D supplement but below goal of 70-100: Lab Results  Component Value Date   VD25OH 61 09/01/2017        Current Medications:  Current Outpatient Medications on File Prior to Visit  Medication Sig  . aspirin EC 81 MG tablet Take 81 mg by mouth daily.  Marland Kitchen b complex vitamins capsule Take 1 capsule by mouth daily.   . bumetanide (BUMEX) 1  MG tablet Take 0.5 mg by mouth daily.  . carvedilol (COREG) 6.25 MG tablet Take 6.25 mg by mouth 2 (two) times daily with a meal.  . cephALEXin (KEFLEX) 500 MG capsule Take 1 capsule (500 mg total) by mouth 3 (three) times daily.   . Cholecalciferol (VITAMIN D) 2000 units CAPS Take 4,000 Units by mouth daily.   . clopidogrel (PLAVIX) 75 MG tablet TAKE 1 TABLET BY MOUTH EVERY DAY TO PREVENT HEART ATTACK  . gabapentin (NEURONTIN) 600 MG tablet TAKE 1 TABLET BY MOUTH AT BEDTIME FOR NEUROPATHY  . losartan (COZAAR) 50 MG tablet Take 25 mg by mouth daily.   . magnesium gluconate (MAGONATE) 500 MG tablet Take 500 mg by mouth daily.   . ranitidine (ZANTAC) 300 MG tablet TAKE 1 TABLET TWICE A DAY FOR REFLUX   No current facility-administered medications on file prior to visit.      Allergies:  Allergies  Allergen Reactions  . Nexium [Esomeprazole Magnesium] Diarrhea     Medical History:  Past Medical History:  Diagnosis Date  . A-fib ([place])   . Biventricular cardiac pacemaker in situ    05/06/13  Old AICD explanted and 6949 lead capped  RV lead  Medtronic 5076 [id number]  LV lead  Medtronic 4194  [id number] V Medtronic biventricular pacer  PVX [id number]   . Diabetes mellitus without complication ([place])   . GERD (gastroesophageal reflux disease)   . History of duodenal ulcer   . History of esophageal stricture   . Hyperlipidemia   . Hypertension   . Occlusion and stenosis of carotid artery without mention of cerebral infarction   . Peripheral neuropathy   . Right groin pain 12/12/2014  . Secondary cardiomyopathy, unspecified   . Transient global amnesia    Family history- Reviewed and unchanged Social history- Reviewed and unchanged   Review of Systems:  Review of Systems  Constitutional: Negative for malaise/fatigue and weight loss.  HENT: Negative for hearing loss and tinnitus.   Eyes: Negative for blurred vision and double vision.  Respiratory: Negative for cough, shortness of breath and wheezing.   Cardiovascular: Negative for chest pain, palpitations, orthopnea, claudication and leg swelling.  Gastrointestinal: Negative for abdominal pain, blood in stool, constipation, diarrhea, heartburn, melena, nausea and  vomiting.  Genitourinary: Negative.   Musculoskeletal: Negative for joint pain and myalgias.  Skin: Negative for rash.  Neurological: Negative for dizziness, tingling, sensory change, weakness and headaches.  Endo/Heme/Allergies: Negative for polydipsia.  Psychiatric/Behavioral: Negative.   All other systems reviewed and are negative.     Physical Exam: There were no vitals taken for this visit. Wt Readings from Last 3 Encounters:  09/06/17 149 lb (67.6 kg)  09/01/17 149 lb 3.2 oz (67.7 kg)  08/28/17 150 lb (68 kg)   General Appearance: Well nourished, in no apparent distress. Eyes: PERRLA, EOMs, conjunctiva no swelling or erythema Sinuses: No Frontal/maxillary tenderness ENT/Mouth: Ext aud canals clear, TMs without erythema, bulging. No erythema, swelling, or exudate on post pharynx.  Tonsils not swollen or erythematous. Hearing normal.  Neck: Supple, thyroid normal.  Respiratory: Respiratory effort normal, BS equal bilaterally without rales, rhonchi, wheezing or stridor.  Cardio: RRR with no MRGs. Brisk peripheral pulses without edema.  Abdomen: Soft, + BS.  Non tender, no guarding, rebound, hernias, masses. Lymphatics: Non tender without lymphadenopathy.  Musculoskeletal: Full ROM, 5/5 strength, {PSY - GAIT AND STATION:22860} gait Skin: Warm, dry without rashes, lesions, ecchymosis.  Neuro: Cranial nerves intact. No cerebellar symptoms.  Psych: Awake and oriented  X 3, normal affect, Insight and Judgment appropriate.    Danielle Ribas, NP 1:41 PM [facility]

## 2017-12-10 ENCOUNTER — Ambulatory Visit (INDEPENDENT_AMBULATORY_CARE_PROVIDER_SITE_OTHER): Payer: Medicare Other | Admitting: Adult Health

## 2017-12-10 ENCOUNTER — Encounter: Payer: Self-pay | Admitting: Adult Health

## 2017-12-10 VITALS — BP 140/68 | HR 64 | Temp 97.9°F | Ht 63.0 in | Wt 149.0 lb

## 2017-12-10 DIAGNOSIS — R7303 Prediabetes: Secondary | ICD-10-CM | POA: Diagnosis not present

## 2017-12-10 DIAGNOSIS — E559 Vitamin D deficiency, unspecified: Secondary | ICD-10-CM | POA: Diagnosis not present

## 2017-12-10 DIAGNOSIS — E2839 Other primary ovarian failure: Secondary | ICD-10-CM

## 2017-12-10 DIAGNOSIS — I2510 Atherosclerotic heart disease of native coronary artery without angina pectoris: Secondary | ICD-10-CM

## 2017-12-10 DIAGNOSIS — R6889 Other general symptoms and signs: Secondary | ICD-10-CM | POA: Diagnosis not present

## 2017-12-10 DIAGNOSIS — K219 Gastro-esophageal reflux disease without esophagitis: Secondary | ICD-10-CM

## 2017-12-10 DIAGNOSIS — Z0001 Encounter for general adult medical examination with abnormal findings: Secondary | ICD-10-CM | POA: Diagnosis not present

## 2017-12-10 DIAGNOSIS — I10 Essential (primary) hypertension: Secondary | ICD-10-CM | POA: Diagnosis not present

## 2017-12-10 DIAGNOSIS — N183 Chronic kidney disease, stage 3 unspecified: Secondary | ICD-10-CM

## 2017-12-10 DIAGNOSIS — Z13820 Encounter for screening for osteoporosis: Secondary | ICD-10-CM

## 2017-12-10 DIAGNOSIS — E782 Mixed hyperlipidemia: Secondary | ICD-10-CM

## 2017-12-10 DIAGNOSIS — I5022 Chronic systolic (congestive) heart failure: Secondary | ICD-10-CM

## 2017-12-10 DIAGNOSIS — I4460 Unspecified fascicular block: Secondary | ICD-10-CM

## 2017-12-10 DIAGNOSIS — Z79899 Other long term (current) drug therapy: Secondary | ICD-10-CM | POA: Diagnosis not present

## 2017-12-10 DIAGNOSIS — I420 Dilated cardiomyopathy: Secondary | ICD-10-CM

## 2017-12-10 DIAGNOSIS — Z950 Presence of cardiac pacemaker: Secondary | ICD-10-CM

## 2017-12-10 DIAGNOSIS — R55 Syncope and collapse: Secondary | ICD-10-CM

## 2017-12-10 NOTE — Progress Notes (Signed)
MEDICARE ANNUAL WELLNESS VISIT AND FOLLOW UP  Assessment:   Diagnoses and all orders for this visit:  Chronic systolic heart failure ([place]) Disease process and medications discussed. Questions answered fully. Emphasized salt restriction, less than 2000mg  a day. Encouraged daily monitoring of the patient's weight, call office if 5 lb weight loss or gain in a day.  Encouraged regular exercise. If any increasing shortness of breath, swelling, or chest pressure go to ER immediately.  decrease your fluid intake to less than 2 L daily please remember to always increase your potassium intake with any increase of your fluid pill.   Essential hypertension At goal; continue medication Monitor blood pressure at home; call if consistently over 130/80 or below 100/60 Continue DASH diet.   Reminder to go to the ER if any CP, SOB, nausea, dizziness, severe HA, changes vision/speech, left arm numbness and tingling and jaw pain.  CKD (chronic kidney disease), stage III (HCC) Increase fluids, avoid NSAIDS, monitor sugars, will monitor -     BASIC METABOLIC PANEL WITH GFR  Mixed hyperlipidemia Not on statin secondary to age; LDLs remain mildly elevated; continue to discuss lifestyle Continue low cholesterol diet and exercise.  -     Lipid panel -     TSH  Vitamin D deficiency Near goal at last check; continue supplementation Defer checking vitamin D level  Medication management -     CBC with Differential/Platelet -     BASIC METABOLIC PANEL WITH GFR -     Hepatic function panel  Prediabetes Discussed disease and risks of elevated glucose Discussed diet/exercise, weight management -     BASIC METABOLIC PANEL WITH GFR  Dilated cardiomyopathy, nonischemic      Continue follow up with cardiology  LBBB Continue follow up with cardiology; pacemaker in place  History of vasodepressor syncope      Monitor; maintain BPs above 100/70; she has not been checking at home - will have her start  checking daily; call if below 100/70  Continue follow up with cardiology  ASHD (arteriosclerotic heart disease) Control blood pressure, cholesterol, glucose, increase exercise.   Gastroesophageal reflux disease, esophagitis presence not specified Well managed on current medications Discussed diet, avoiding triggers and other lifestyle changes   Over 40 minutes of exam, counseling, chart review and critical decision making was performed Future Appointments  Date Time Provider [place]  01/30/2018 10:00 AM Evans Lance, MD [facility]  02/02/2018  8:45 AM Trula Slade, DPM [facility]  03/11/2018  9:00 AM Unk Pinto, MD [facility] None     Plan:   During the course of the visit the patient was educated and counseled about appropriate screening and preventive services including:    Pneumococcal vaccine   Prevnar 13  Influenza vaccine  Td vaccine  Screening electrocardiogram  Bone densitometry screening  Colorectal cancer screening  Diabetes screening  Glaucoma screening  Nutrition counseling   Advanced directives: requested   Subjective:  [name] is a 84 y.o. female who presents for Medicare Annual Wellness Visit and 3 month follow up. She reports she continues to have syncopal episodes, most recent was end of November. She has negative head CT in 08/2017 with evidence of chronic vessel narrowing and small remote cerebellar infarcts. She has been evaluated by cardiology as well and has pacer evaluated.   She has a history of Hypertensive Heart disease with heart failure, denies dyspnea on exertion, orthopnea, paroxysmal nocturnal dyspnea and edema. Positive for syncope. Wt Readings from  Last 3 Encounters:  12/10/17 149 lb (67.6 kg)  09/06/17 149 lb (67.6 kg)  09/01/17 149 lb 3.2 oz (67.7 kg)   She reports she has not been checking her BP at home, today their BP is BP: 140/68 She does not workout. She denies chest  pain, shortness of breath, dizziness.   She is not on cholesterol medication secondary to age. Her cholesterol is not at goal. The cholesterol last visit was:   Lab Results  Component Value Date   CHOL 219 (H) 09/01/2017   HDL 53 09/01/2017   LDLCALC 130 (H) 05/30/2017   TRIG 190 (H) 09/01/2017   CHOLHDL 4.1 09/01/2017    She has not been working on diet and exercise for prediabetes, and denies hyperglycemia, hypoglycemia , increased appetite, nausea, paresthesia of the feet, polydipsia, polyuria, visual disturbances and vomiting. Last A1C in the office was:  Lab Results  Component Value Date   HGBA1C 5.6 09/01/2017   Last GFR: Lab Results  Component Value Date   GFRNONAA 33 (L) 09/06/2017   Patient is on Vitamin D supplement and near goal at last check:    Lab Results  Component Value Date   VD25OH 61 09/01/2017      Medication Review: Current Outpatient Medications on File Prior to Visit  Medication Sig Dispense Refill  . aspirin EC 81 MG tablet Take 81 mg by mouth daily.    Marland Kitchen b complex vitamins capsule Take 1 capsule by mouth daily.     . bumetanide (BUMEX) 1 MG tablet Take 0.5 mg by mouth daily.  12  . carvedilol (COREG) 6.25 MG tablet Take 6.25 mg by mouth 2 (two) times daily with a meal.    . Cholecalciferol (VITAMIN D) 2000 units CAPS Take 4,000 Units by mouth daily.     . clopidogrel (PLAVIX) 75 MG tablet TAKE 1 TABLET BY MOUTH EVERY DAY TO PREVENT HEART ATTACK 30 tablet 6  . gabapentin (NEURONTIN) 600 MG tablet TAKE 1 TABLET BY MOUTH AT BEDTIME FOR NEUROPATHY 90 tablet 1  . losartan (COZAAR) 50 MG tablet Take 25 mg by mouth daily.     . magnesium gluconate (MAGONATE) 500 MG tablet Take 500 mg by mouth daily.     . ranitidine (ZANTAC) 300 MG tablet TAKE 1 TABLET TWICE A DAY FOR REFLUX 180 tablet 1  . cephALEXin (KEFLEX) 500 MG capsule Take 1 capsule (500 mg total) by mouth 3 (three) times daily. (Patient not taking: Reported on 12/10/2017) 21 capsule 0   No current  facility-administered medications on file prior to visit.     Allergies  Allergen Reactions  . Nexium [Esomeprazole Magnesium] Diarrhea    Current Problems (verified) Patient Active Problem List   Diagnosis Date Noted  . ASHD (arteriosclerotic heart disease) 12/05/2014  . GERD (gastroesophageal reflux disease) 12/05/2014  . Prediabetes 05/21/2014  . Vitamin D deficiency 05/20/2014  . Medication management 05/20/2014  . CKD (chronic kidney disease), stage III ([place]) 06/21/2013  . History of vasodepressor syncope   . Biventricular cardiac pacemaker in situ   . Mixed hyperlipidemia   . Hypertension   . LBBB   . Chronic systolic heart failure ([place])   . Dilated cardiomyopathy, nonischemic     Screening Tests Immunization History  Administered Date(s) Administered  . DT 10/03/2015  . Influenza, High Dose Seasonal PF 10/19/2013, 08/06/2016  . Influenza-Unspecified 10/08/2012, 02/03/2015  . Pneumococcal Conjugate-13 08/31/2014  . Pneumococcal-Unspecified 12/09/2002  . Td 12/09/2002  . Zoster 08/06/2016   Preventative  care: Last colonoscopy: 2012 Last mammogram: 2017 - wants to get every other year - due 2019 Last pap smear/pelvic exam: remote, declines DEXA:long time ago, does request today CT head 06/2013, 08/2017 CXR 05/2014 Echo 06/2013 Carotid US 06/2013  Prior vaccinations: TD or Tdap: 2016 Influenza: 2018 Pneumococcal: 2004 Prevnar 13: 2015 Shingles/Zostavax: 2017  Names of Other Physician/Practitioners you currently use: 1. [facility] here for primary care 2. Andree Elk, eye doctor, last visit 2018 3. , dentist, last visit 2018  Patient Care Team: Unk Pinto, MD as PCP - General (Internal Medicine) Unk Pinto, MD (Internal Medicine) Jacolyn Reedy, MD as Consulting Physician (Cardiology) Regal, Tamala Fothergill, DPM as Consulting Physician (Podiatry) Dohmeier, Asencion Partridge, MD as Consulting Physician (Neurology) G.  Eston Mould II (Ophthalmology) Magnus Ivan. Adams, DDS  SURGICAL HISTORY She  has a past surgical history that includes Cardiac defibrillator placement (09/2004); Cardiac catheterization; Appendectomy; Abdominal hysterectomy; Biv pacemaker generator change out (05/05/2013); Carpal tunnel release; Cholecystectomy; Breast surgery (Left, 1985); and bi-ventricular pacemaker upgrade (N/A, 05/05/2013). FAMILY HISTORY Her family history includes Cancer in her maternal grandmother and sister; Dementia in her mother; Diabetes in her daughter, mother, and sister; Heart Problems in her father and sister; Heart attack in her brother and son; Heart disease in her brother and son; Hyperlipidemia in her daughter, sister, and sister; Hypertension in her daughter, sister, and sister; Stroke in her daughter. SOCIAL HISTORY She  reports that she is a non-smoker but has been exposed to tobacco smoke. she has never used smokeless tobacco. She reports that she does not drink alcohol or use drugs.   MEDICARE WELLNESS OBJECTIVES: Physical activity: Current Exercise Habits: The patient does not participate in regular exercise at present, Exercise limited by: cardiac condition(s) Cardiac risk factors: Cardiac Risk Factors include: advanced age (>65men, >20 women);dyslipidemia;hypertension;sedentary lifestyle Depression/mood screen:   Depression screen [facility] 2/9 12/10/2017  Decreased Interest 0  Down, Depressed, Hopeless 0  PHQ - 2 Score 0    ADLs:  In your present state of health, do you have any difficulty performing the following activities: 12/10/2017 09/01/2017  Hearing? Y N  Comment HOH, deaf in L, has refused hearing aids due to cost -  Vision? N N  Difficulty concentrating or making decisions? N N  Walking or climbing stairs? N N  Dressing or bathing? N N  Doing errands, shopping? Y N  Comment No longer driving due to recent syncopal episodes -  Preparing Food and eating ? N -  Using the Toilet? N -  In the  past six months, have you accidently leaked urine? N -  Do you have problems with loss of bowel control? N -  Managing your Medications? N -  Managing your Finances? N -  Housekeeping or managing your Housekeeping? N -  Some recent data might be hidden     Cognitive Testing  Alert? Yes  Normal Appearance?Yes  Oriented to person? Yes  Place? Yes   Time? Yes  Recall of three objects?  Yes  Can perform simple calculations? Yes  Displays appropriate judgment?Yes  Can read the correct time from a watch face?Yes  EOL planning: Does Patient Have a Medical Advance Directive?: Yes Type of Advance Directive: Healthcare Power of Attorney, Living will Does patient want to make changes to medical advance directive?: No - Patient declined Copy of [place] in Chart?: No - copy requested  Review of Systems  Constitutional: Negative for malaise/fatigue and weight loss.  HENT:  Negative for hearing loss and tinnitus.   Eyes: Negative for blurred vision and double vision.  Respiratory: Negative for cough, shortness of breath and wheezing.   Cardiovascular: Negative for chest pain, palpitations, orthopnea, claudication and leg swelling.  Gastrointestinal: Negative for abdominal pain, blood in stool, constipation, diarrhea, heartburn, melena, nausea and vomiting.  Genitourinary: Negative.   Musculoskeletal: Positive for falls. Negative for joint pain and myalgias.  Skin: Negative for rash.  Neurological: Negative for dizziness, tingling, sensory change, weakness and headaches.  Endo/Heme/Allergies: Negative for polydipsia.  Psychiatric/Behavioral: Negative.  Negative for depression and memory loss. The patient is not nervous/anxious and does not have insomnia.   All other systems reviewed and are negative.    Objective:     Today's Vitals   12/10/17 0932  BP: 140/68  Pulse: 64  Temp: 97.9 F (36.6 C)  SpO2: 98%  Weight: 149 lb (67.6 kg)  Height: 5\' 3"  (1.6 m)   Body  mass index is 26.39 kg/m.  General appearance: alert, no distress, frail WD/WN, female HEENT: normocephalic, sclerae anicteric, TMs pearly, nares patent, no discharge or erythema, pharynx normal Oral cavity: MMM, no lesions Neck: supple, no lymphadenopathy, no thyromegaly, no masses Heart: RRR, normal S1, S2, no murmurs Lungs: CTA bilaterally, no wheezes, rhonchi, or rales Abdomen: +bs, soft, non tender, non distended, no masses, no hepatomegaly, no splenomegaly Musculoskeletal: nontender, no swelling, no obvious deformity Extremities: no edema, no cyanosis, no clubbing Pulses: 2+ symmetric, upper and lower extremities, normal cap refill Neurological: alert, oriented x 3, CN2-12 intact, strength weak but symmetrical throughout upper extremities and lower extremities, sensation normal throughout, DTRs 2+ throughout, no cerebellar signs, slow unsteady gait uses walker Psychiatric: normal affect, behavior normal, pleasant   Medicare Attestation I have personally reviewed: The patient's medical and social history Their use of alcohol, tobacco or illicit drugs Their current medications and supplements The patient's functional ability including ADLs,fall risks, home safety risks, cognitive, and hearing and visual impairment Diet and physical activities Evidence for depression or mood disorders  The patient's weight, height, BMI, and visual acuity have been recorded in the chart.  I have made referrals, counseling, and provided education to the patient based on review of the above and I have provided the patient with a written personalized care plan for preventive services.     Izora Ribas, NP   12/10/2017

## 2017-12-10 NOTE — Addendum Note (Signed)
Addended by: Izora Ribas on: 12/10/2017 01:54 PM   Modules accepted: Orders

## 2017-12-10 NOTE — Patient Instructions (Signed)
Monitor your blood pressure at home. Go to the ER if any CP, SOB, nausea, dizziness, severe HA, changes vision/speech  Goal BP:  For patients younger than 60: Goal BP < 140/90. For patients 60 and older: Goal BP < 150/90. For patients with diabetes: Goal BP < 140/90. Your most recent BP: BP: 140/68   Call if BP readings lower than 100/60  Take your medications faithfully as instructed. Maintain a healthy weight. Get at least 150 minutes of aerobic exercise per week. Minimize salt intake. Minimize alcohol intake  DASH Eating Plan DASH stands for "Dietary Approaches to Stop Hypertension." The DASH eating plan is a healthy eating plan that has been shown to reduce high blood pressure (hypertension). Additional health benefits may include reducing the risk of type 2 diabetes mellitus, heart disease, and stroke. The DASH eating plan may also help with weight loss. WHAT DO I NEED TO KNOW ABOUT THE DASH EATING PLAN? For the DASH eating plan, you will follow these general guidelines:  Choose foods with a percent daily value for sodium of less than 5% (as listed on the food label).  Use salt-free seasonings or herbs instead of table salt or sea salt.  Check with your health care provider or pharmacist before using salt substitutes.  Eat lower-sodium products, often labeled as "lower sodium" or "no salt added."  Eat fresh foods.  Eat more vegetables, fruits, and low-fat dairy products.  Choose whole grains. Look for the word "whole" as the first word in the ingredient list.  Choose fish and skinless chicken or [place] more often than red meat. Limit fish, poultry, and meat to 6 oz (170 g) each day.  Limit sweets, desserts, sugars, and sugary drinks.  Choose heart-healthy fats.  Limit cheese to 1 oz (28 g) per day.  Eat more home-cooked food and less restaurant, buffet, and fast food.  Limit fried foods.  Cook foods using methods other than frying.  Limit canned vegetables. If  you do use them, rinse them well to decrease the sodium.  When eating at a restaurant, ask that your food be prepared with less salt, or no salt if possible. WHAT FOODS CAN I EAT? Seek help from a dietitian for individual calorie needs. Grains Whole grain or whole wheat bread. Brown rice. Whole grain or whole wheat pasta. Quinoa, bulgur, and whole grain cereals. Low-sodium cereals. Corn or whole wheat flour tortillas. Whole grain cornbread. Whole grain crackers. Low-sodium crackers. Vegetables Fresh or frozen vegetables (raw, steamed, roasted, or grilled). Low-sodium or reduced-sodium tomato and vegetable juices. Low-sodium or reduced-sodium tomato sauce and paste. Low-sodium or reduced-sodium canned vegetables.  Fruits All fresh, canned (in natural juice), or frozen fruits. Meat and Other Protein Products Ground beef (85% or leaner), grass-fed beef, or beef trimmed of fat. Skinless chicken or [place]. Ground chicken or [place]. Pork trimmed of fat. All fish and seafood. Eggs. Dried beans, peas, or lentils. Unsalted nuts and seeds. Unsalted canned beans. Dairy Low-fat dairy products, such as skim or 1% milk, 2% or reduced-fat cheeses, low-fat ricotta or cottage cheese, or plain low-fat yogurt. Low-sodium or reduced-sodium cheeses. Fats and Oils Tub margarines without trans fats. Light or reduced-fat mayonnaise and salad dressings (reduced sodium). Avocado. Safflower, olive, or canola oils. Natural peanut or almond butter. Other Unsalted popcorn and pretzels. The items listed above may not be a complete list of recommended foods or beverages. Contact your dietitian for more options. WHAT FOODS ARE NOT RECOMMENDED? Grains White bread. White pasta. White rice. Refined  cornbread. Bagels and croissants. Crackers that contain trans fat. Vegetables Creamed or fried vegetables. Vegetables in a cheese sauce. Regular canned vegetables. Regular canned tomato sauce and paste. Regular tomato and vegetable  juices. Fruits Dried fruits. Canned fruit in light or heavy syrup. Fruit juice. Meat and Other Protein Products Fatty cuts of meat. Ribs, chicken wings, bacon, sausage, bologna, salami, chitterlings, fatback, hot dogs, bratwurst, and packaged luncheon meats. Salted nuts and seeds. Canned beans with salt. Dairy Whole or 2% milk, cream, half-and-half, and cream cheese. Whole-fat or sweetened yogurt. Full-fat cheeses or blue cheese. Nondairy creamers and whipped toppings. Processed cheese, cheese spreads, or cheese curds. Condiments Onion and garlic salt, seasoned salt, table salt, and sea salt. Canned and packaged gravies. Worcestershire sauce. Tartar sauce. Barbecue sauce. Teriyaki sauce. Soy sauce, including reduced sodium. Steak sauce. Fish sauce. Oyster sauce. Cocktail sauce. Horseradish. Ketchup and mustard. Meat flavorings and tenderizers. Bouillon cubes. Hot sauce. Tabasco sauce. Marinades. Taco seasonings. Relishes. Fats and Oils Butter, stick margarine, lard, shortening, ghee, and bacon fat. Coconut, palm kernel, or palm oils. Regular salad dressings. Other Pickles and olives. Salted popcorn and pretzels. The items listed above may not be a complete list of foods and beverages to avoid. Contact your dietitian for more information. WHERE CAN I FIND MORE INFORMATION? National Heart, Lung, and Blood Institute: travelstabloid.com Document Released: 11/14/2011 Document Revised: 04/11/2014 Document Reviewed: 09/29/2013 [facility] Patient Information 2015 [place], [place]. This information is not intended to replace advice given to you by your health care provider. Make sure you discuss any questions you have with your health care provider.

## 2017-12-11 LAB — CBC WITH DIFFERENTIAL/PLATELET
Basophils Absolute: 73 cells/uL (ref 0–200)
Basophils Relative: 0.9 %
EOS PCT: 3.3 %
Eosinophils Absolute: 267 cells/uL (ref 15–500)
HCT: 38.5 % (ref 35.0–45.0)
Hemoglobin: 13.2 g/dL (ref 11.7–15.5)
Lymphs Abs: 2244 cells/uL (ref 850–3900)
MCH: 30.7 pg (ref 27.0–33.0)
MCHC: 34.3 g/dL (ref 32.0–36.0)
MCV: 89.5 fL (ref 80.0–100.0)
MONOS PCT: 7.4 %
MPV: 12 fL (ref 7.5–12.5)
NEUTROS PCT: 60.7 %
Neutro Abs: 4917 cells/uL (ref 1500–7800)
Platelets: 251 10*3/uL (ref 140–400)
RBC: 4.3 10*6/uL (ref 3.80–5.10)
RDW: 12.2 % (ref 11.0–15.0)
Total Lymphocyte: 27.7 %
WBC mixed population: 599 cells/uL (ref 200–950)
WBC: 8.1 10*3/uL (ref 3.8–10.8)

## 2017-12-11 LAB — LIPID PANEL
Cholesterol: 201 mg/dL — ABNORMAL HIGH (ref <200)
HDL: 54 mg/dL (ref >50)
LDL Cholesterol (Calc): 115 mg/dL (calc) — ABNORMAL HIGH
Non-HDL Cholesterol (Calc): 147 mg/dL (calc) — ABNORMAL HIGH (ref <130)
TRIGLYCERIDES: 201 mg/dL — AB (ref <150)
Total CHOL/HDL Ratio: 3.7 (calc) (ref <5.0)

## 2017-12-11 LAB — BASIC METABOLIC PANEL WITH GFR
BUN/Creatinine Ratio: 12 (calc) (ref 6–22)
BUN: 15 mg/dL (ref 7–25)
CHLORIDE: 104 mmol/L (ref 98–110)
CO2: 27 mmol/L (ref 20–32)
Calcium: 9.3 mg/dL (ref 8.6–10.4)
Creat: 1.3 mg/dL — ABNORMAL HIGH (ref 0.60–0.88)
GFR, Est African American: 42 mL/min/{1.73_m2} — ABNORMAL LOW (ref >=60)
GFR, Est Non African American: 36 mL/min/{1.73_m2} — ABNORMAL LOW (ref >=60)
GLUCOSE: 99 mg/dL (ref 65–99)
POTASSIUM: 4.6 mmol/L (ref 3.5–5.3)
SODIUM: 139 mmol/L (ref 135–146)

## 2017-12-11 LAB — HEPATIC FUNCTION PANEL
AG Ratio: 2.2 (calc) (ref 1.0–2.5)
ALKALINE PHOSPHATASE (APISO): 48 U/L (ref 33–130)
ALT: 10 U/L (ref 6–29)
AST: 18 U/L (ref 10–35)
Albumin: 4.1 g/dL (ref 3.6–5.1)
Bilirubin, Direct: 0.1 mg/dL (ref 0.0–0.2)
Globulin: 1.9 g/dL (calc) (ref 1.9–3.7)
Indirect Bilirubin: 0.5 mg/dL (calc) (ref 0.2–1.2)
TOTAL PROTEIN: 6 g/dL — AB (ref 6.1–8.1)
Total Bilirubin: 0.6 mg/dL (ref 0.2–1.2)

## 2017-12-11 LAB — TSH: TSH: 1.52 mIU/L (ref 0.40–4.50)

## 2017-12-12 ENCOUNTER — Telehealth: Payer: Self-pay | Admitting: Internal Medicine

## 2017-12-12 DIAGNOSIS — I951 Orthostatic hypotension: Secondary | ICD-10-CM | POA: Diagnosis not present

## 2017-12-12 DIAGNOSIS — Z8673 Personal history of transient ischemic attack (TIA), and cerebral infarction without residual deficits: Secondary | ICD-10-CM | POA: Diagnosis not present

## 2017-12-12 DIAGNOSIS — R0609 Other forms of dyspnea: Secondary | ICD-10-CM | POA: Diagnosis not present

## 2017-12-12 DIAGNOSIS — K219 Gastro-esophageal reflux disease without esophagitis: Secondary | ICD-10-CM | POA: Diagnosis not present

## 2017-12-12 DIAGNOSIS — E785 Hyperlipidemia, unspecified: Secondary | ICD-10-CM | POA: Diagnosis not present

## 2017-12-12 DIAGNOSIS — I119 Hypertensive heart disease without heart failure: Secondary | ICD-10-CM | POA: Diagnosis not present

## 2017-12-12 DIAGNOSIS — I6529 Occlusion and stenosis of unspecified carotid artery: Secondary | ICD-10-CM | POA: Diagnosis not present

## 2017-12-12 DIAGNOSIS — Z950 Presence of cardiac pacemaker: Secondary | ICD-10-CM | POA: Diagnosis not present

## 2017-12-12 DIAGNOSIS — R55 Syncope and collapse: Secondary | ICD-10-CM | POA: Diagnosis not present

## 2017-12-12 NOTE — Telephone Encounter (Signed)
Patient calls in reporting her last syncopal episode was about a week before christmas.  States she was hardly doing anything when it happened.  No injuries with incident. She tells me of previous occurrences last summer/fall of similar incidents. Over late summer she was pulling weeds out of her flower garden when she experienced episode.  The following incident was not recent --  She also reports walking into her garage and started feeling funny.  She turned around to walk back in the house and her daughter helped her to chair before she passed out. Pt tells me that her dtr told her that "her body was jerking".  She was taken to the ED and she was hypotensive. Patient seen by her primary cardiologist this morning, Dr. Wynonia Lawman.  Will forward this message to device clinic to arrange patient to come into office next week for device check  (she cannot come today). Patient "would like to make sure issues are related to my heart".  Patient verbalized understanding and agreeable to plan.

## 2017-12-12 NOTE — Telephone Encounter (Signed)
Called patient to set up a DC appointment next week. Patient stated that she did not want this appointment as she was not sure she would be able to come. She was insistent on not making an appointment.

## 2017-12-12 NOTE — Telephone Encounter (Signed)
Pt c/o Syncope: STAT if syncope occurred within 30 minutes and pt complains of lightheadedness High Priority if episode of passing out, completely, today or in last 24 hours   1. Did you pass out today? no  When is the last time you passed out? Before christmas 2. Has this occurred multiple times? Many times    3. Did you have any symptoms prior to passing out? no

## 2017-12-19 ENCOUNTER — Ambulatory Visit
Admission: RE | Admit: 2017-12-19 | Discharge: 2017-12-19 | Disposition: A | Discharge status: Home / Self Care | Payer: Medicare Other | Source: Ambulatory Visit | Attending: Adult Health | Admitting: Adult Health

## 2017-12-19 DIAGNOSIS — E2839 Other primary ovarian failure: Secondary | ICD-10-CM

## 2017-12-19 DIAGNOSIS — M85852 Other specified disorders of bone density and structure, left thigh: Secondary | ICD-10-CM | POA: Diagnosis not present

## 2017-12-19 DIAGNOSIS — Z780 Asymptomatic menopausal state: Secondary | ICD-10-CM | POA: Diagnosis not present

## 2018-01-02 ENCOUNTER — Inpatient Hospital Stay: Admission: RE | Admit: 2018-01-02 | Payer: Medicare Other | Source: Ambulatory Visit

## 2018-01-13 ENCOUNTER — Telehealth: Payer: Self-pay | Admitting: Internal Medicine

## 2018-01-13 NOTE — Telephone Encounter (Signed)
Requested patient transfer in [place] at the request of Myrtie Hawk [facility]). I advised that [name] call Dr. Thurman Coyer office and ask that they release [name] in [place]. She is agreeable.

## 2018-01-13 NOTE — Telephone Encounter (Signed)
Returned call to [employer], [place].  Per Izora Gala, she had called Dr. Thurman Coyer office to follow up on Pt's syncopal episodes and she was advised to call Dr. Lovena Le.  At this time, device clinic is unable to follow remotes (they go to Dr. Thurman Coyer office).  Izora Gala wants our office to follow Pt's device.  Will request this from Dr. Thurman Coyer office.  Pt has f/u with Dr. Lovena Le 01/30/2018.  Daughter requesting earlier visit.  Gave to scheduler to see if Pt could be worked in sooner.  Daughter thanked this Marine scientist.

## 2018-01-13 NOTE — Telephone Encounter (Signed)
New message   Izora Gala her daughter calling to see if Lovena Le will be also checking on why the pt is passing out and loosing breath Says that pt pcp told them to check with Lovena Le. Please call

## 2018-01-21 ENCOUNTER — Encounter: Payer: Medicare Other | Admitting: Internal Medicine

## 2018-01-24 ENCOUNTER — Other Ambulatory Visit: Payer: Self-pay | Admitting: Internal Medicine

## 2018-01-26 ENCOUNTER — Telehealth: Payer: Self-pay | Admitting: Internal Medicine

## 2018-01-26 NOTE — Telephone Encounter (Signed)
Transmission not received. [name] will have her granddaughter help her this week.

## 2018-01-26 NOTE — Telephone Encounter (Signed)
New message   Patient calling to confirm transmission for today. Please call  1. Has your device fired? NO  2. Is you device beeping? NO  3. Are you experiencing draining or swelling at device site? NO  4. Are you calling to see if we received your device transmission? YES  5. Have you passed out? NO    Please route to [place]

## 2018-01-28 ENCOUNTER — Ambulatory Visit (INDEPENDENT_AMBULATORY_CARE_PROVIDER_SITE_OTHER): Payer: Medicare Other | Admitting: *Deleted

## 2018-01-28 DIAGNOSIS — I5022 Chronic systolic (congestive) heart failure: Secondary | ICD-10-CM

## 2018-01-28 DIAGNOSIS — Z950 Presence of cardiac pacemaker: Secondary | ICD-10-CM

## 2018-01-28 DIAGNOSIS — I420 Dilated cardiomyopathy: Secondary | ICD-10-CM

## 2018-01-28 NOTE — Progress Notes (Signed)
Remote pacemaker transmission.   

## 2018-01-29 ENCOUNTER — Encounter: Payer: Self-pay | Admitting: Cardiology

## 2018-01-30 ENCOUNTER — Encounter: Payer: Self-pay | Admitting: Internal Medicine

## 2018-01-30 ENCOUNTER — Ambulatory Visit (INDEPENDENT_AMBULATORY_CARE_PROVIDER_SITE_OTHER): Payer: Medicare Other | Admitting: Internal Medicine

## 2018-01-30 ENCOUNTER — Encounter: Payer: Medicare Other | Admitting: Internal Medicine

## 2018-01-30 VITALS — BP 140/72 | HR 65 | Ht 63.0 in | Wt 151.0 lb

## 2018-01-30 DIAGNOSIS — I10 Essential (primary) hypertension: Secondary | ICD-10-CM

## 2018-01-30 DIAGNOSIS — I2510 Atherosclerotic heart disease of native coronary artery without angina pectoris: Secondary | ICD-10-CM

## 2018-01-30 DIAGNOSIS — R55 Syncope and collapse: Secondary | ICD-10-CM | POA: Diagnosis not present

## 2018-01-30 DIAGNOSIS — I5022 Chronic systolic (congestive) heart failure: Secondary | ICD-10-CM

## 2018-01-30 DIAGNOSIS — Z950 Presence of cardiac pacemaker: Secondary | ICD-10-CM | POA: Diagnosis not present

## 2018-01-30 LAB — CUP PACEART INCLINIC DEVICE CHECK
Battery Remaining Longevity: 54 mo
Brady Statistic AP VS Percent: 0.03 %
Brady Statistic AS VS Percent: 0.63 %
Brady Statistic RV Percent Paced: 99.11 %
Date Time Interrogation Session: 20190222130140
Implantable Lead Implant Date: 20051007
Implantable Lead Implant Date: 20140528
Implantable Lead Location: 753858
Implantable Lead Location: 753860
Implantable Lead Model: 5076
Implantable Pulse Generator Implant Date: 20140528
Lead Channel Impedance Value: 418 Ohm
Lead Channel Impedance Value: 456 Ohm
Lead Channel Impedance Value: 475 Ohm
Lead Channel Impedance Value: 494 Ohm
Lead Channel Impedance Value: 570 Ohm
Lead Channel Pacing Threshold Amplitude: 0.625 V
Lead Channel Pacing Threshold Amplitude: 1.25 V
Lead Channel Pacing Threshold Pulse Width: 0.4 ms
Lead Channel Sensing Intrinsic Amplitude: 1.5 mV
Lead Channel Sensing Intrinsic Amplitude: 10 mV
Lead Channel Sensing Intrinsic Amplitude: 12.875 mV
Lead Channel Setting Pacing Amplitude: 2 V
Lead Channel Setting Pacing Amplitude: 2.25 V
Lead Channel Setting Sensing Sensitivity: 0.9 mV
MDC IDC LEAD IMPLANT DT: 20140528
MDC IDC LEAD LOCATION: 753859
MDC IDC MSMT BATTERY VOLTAGE: 2.99 V
MDC IDC MSMT LEADCHNL LV IMPEDANCE VALUE: 304 Ohm
MDC IDC MSMT LEADCHNL LV IMPEDANCE VALUE: 418 Ohm
MDC IDC MSMT LEADCHNL LV IMPEDANCE VALUE: 589 Ohm
MDC IDC MSMT LEADCHNL RA IMPEDANCE VALUE: 418 Ohm
MDC IDC MSMT LEADCHNL RA PACING THRESHOLD AMPLITUDE: 0.375 V
MDC IDC MSMT LEADCHNL RA PACING THRESHOLD PULSEWIDTH: 0.4 ms
MDC IDC MSMT LEADCHNL RA SENSING INTR AMPL: 1.375 mV
MDC IDC MSMT LEADCHNL RV PACING THRESHOLD PULSEWIDTH: 0.4 ms
MDC IDC SET LEADCHNL LV PACING PULSEWIDTH: 0.4 ms
MDC IDC SET LEADCHNL RA PACING AMPLITUDE: 2 V
MDC IDC SET LEADCHNL RV PACING PULSEWIDTH: 0.4 ms
MDC IDC STAT BRADY AP VP PERCENT: 15.68 %
MDC IDC STAT BRADY AS VP PERCENT: 83.66 %
MDC IDC STAT BRADY RA PERCENT PACED: 15.67 %

## 2018-01-30 MED ORDER — BUMETANIDE 1 MG PO TABS: ORAL_TABLET | ORAL | 3 refills | Status: DC

## 2018-01-30 NOTE — Patient Instructions (Addendum)
Medication Instructions:  Your physician has recommended you make the following change in your medication:  1.  Change your Bumex to as needed.  Labwork: None ordered.  Testing/Procedures: None ordered.  Follow-Up: Your physician wants you to follow-up in: one year with Dr. Lovena Le.   You will receive a reminder letter in the mail two months in advance. If you don't receive a letter, please call our office to schedule the follow-up appointment.  Remote monitoring is used to monitor your Pacemaker from home. This monitoring reduces the number of office visits required to check your device to one time per year. It allows [place] to keep an eye on the functioning of your device to ensure it is working properly. You are scheduled for a device check from home on 04/29/2018. You may send your transmission at any time that day. If you have a wireless device, the transmission will be sent automatically. After your physician reviews your transmission, you will receive a postcard with your next transmission date.  Any Other Special Instructions Will Be Listed Below (If Applicable).  Drink more fluids.  If you need a refill on your cardiac medications before your next appointment, please call your pharmacy.

## 2018-01-30 NOTE — Progress Notes (Signed)
HPI [name] returns today for followup.  She is a very pleasant 84 year old woman with a history of chronic systolic heart failure, status post ICD implantation, who underwent removal of her prior ICD and insertion of a biventricular pacemaker. She returns today for followup.  She denies shortness of breath. No edema. No fever or chills. She denies chest pain. She has had a couple of episodes of syncope/severe dizziness which were associated with upright posture. Interrogation of her PPM does not suggest an arrhythmia. She denies medical non-compliance. No loss of bowel or bladder continence.  Allergies  Allergen Reactions  . Nexium [Esomeprazole Magnesium] Diarrhea     Current Outpatient Medications  Medication Sig Dispense Refill  . aspirin EC 81 MG tablet Take 81 mg by mouth daily.    Marland Kitchen b complex vitamins capsule Take 1 capsule by mouth daily.     . carvedilol (COREG) 6.25 MG tablet Take 6.25 mg by mouth 2 (two) times daily with a meal.    . Cholecalciferol (VITAMIN D) 2000 units CAPS Take 4,000 Units by mouth daily.     . clopidogrel (PLAVIX) 75 MG tablet TAKE 1 TABLET BY MOUTH EVERY DAY TO PREVENT HEART ATTACK 30 tablet 6  . gabapentin (NEURONTIN) 600 MG tablet TAKE 1 TABLET BY MOUTH AT BEDTIME FOR NEUROPATHY 90 tablet 1  . losartan (COZAAR) 50 MG tablet Take 25 mg by mouth daily.     . magnesium gluconate (MAGONATE) 500 MG tablet Take 500 mg by mouth daily.     . ranitidine (ZANTAC) 300 MG tablet TAKE 1 TABLET TWICE A DAY FOR REFLUX 180 tablet 1  . bumetanide (BUMEX) 1 MG tablet Take 1/2 tablet as needed for edema. 30 tablet 3   No current facility-administered medications for this visit.      Past Medical History:  Diagnosis Date  . A-fib ([place])   . Biventricular cardiac pacemaker in situ    05/06/13  Old AICD explanted and 6949 lead capped  RV lead  Medtronic 5076 [id number]  LV lead  Medtronic 4194  [id number] V Medtronic biventricular pacer  PVX [id number]   . Diabetes  mellitus without complication ([place])   . GERD (gastroesophageal reflux disease)   . History of duodenal ulcer   . History of esophageal stricture   . Hyperlipidemia   . Hypertension   . Occlusion and stenosis of carotid artery without mention of cerebral infarction   . Peripheral neuropathy   . Right groin pain 12/12/2014  . Secondary cardiomyopathy, unspecified   . Transient global amnesia     ROS:   All systems reviewed and negative except as noted in the HPI.   Past Surgical History:  Procedure Laterality Date  . ABDOMINAL HYSTERECTOMY    . APPENDECTOMY    . BI-VENTRICULAR PACEMAKER UPGRADE N/A 05/05/2013   Procedure: BI-VENTRICULAR PACEMAKER UPGRADE;  Surgeon: Evans Lance, MD;  Location: [facility] CATH LAB;  Service: Cardiovascular;  Laterality: N/A;  . BIV PACEMAKER GENERATOR CHANGE OUT  05/05/2013   MDT CRTP upgrade (previously implanted dual chamber ICD) by Dr Lovena Le.  New RV/LV leads placed.   Marland Kitchen BREAST SURGERY Left 1985   Negative  Biopsy  . CARDIAC CATHETERIZATION     04/2003  . CARDIAC DEFIBRILLATOR PLACEMENT  09/2004   MDT ICD implanted for primary prevention; device upgrade to CRTP in 04-2013  . CARPAL TUNNEL RELEASE    . CHOLECYSTECTOMY       Family History  Problem Relation Age of  Onset  . Dementia Mother   . Diabetes Mother   . Heart Problems Father   . Heart attack Son   . Heart disease Son   . Heart disease Brother   . Heart attack Brother   . Cancer Maternal Grandmother        breast  . Stroke Daughter   . Hypertension Daughter   . Hyperlipidemia Daughter   . Diabetes Daughter   . Diabetes Sister   . Hypertension Sister   . Hyperlipidemia Sister   . Hypertension Sister   . Hyperlipidemia Sister   . Cancer Sister        Breast  . Heart Problems Sister      Social History   Socioeconomic History  . Marital status: Widowed    Spouse name: Not on file  . Number of children: 2  . Years of education: 9  . Highest education level: Not on file    Social Needs  . Financial resource strain: Not on file  . Food insecurity - worry: Not on file  . Food insecurity - inability: Not on file  . Transportation needs - medical: Not on file  . Transportation needs - non-medical: Not on file  Occupational History  . Not on file  Tobacco Use  . Smoking status: Passive Smoke Exposure - Never Smoker  . Smokeless tobacco: Never Used  . Tobacco comment: worked at [employer] and Sexual Activity  . Alcohol use: No    Alcohol/week: 0.0 oz  . Drug use: No  . Sexual activity: Not on file  Other Topics Concern  . Not on file  Social History Narrative   Caffeine Daily average (3 cups am).  Retired.    Widowed.  6 KIDS.  Lives with daughter at her home.     BP 140/72   Pulse 65   Ht 5\' 3"  (1.6 m)   Wt 151 lb (68.5 kg)   BMI 26.75 kg/m   Physical Exam:  Well appearing 84 yo woman, NAD HEENT: Unremarkable Neck:  No JVD, no thyromegally Lymphatics:  No adenopathy Back:  No CVA tenderness Lungs:  Clear with no wheezes HEART:  Regular rate rhythm, no murmurs, no rubs, no clicks Abd:  soft, positive bowel sounds, no organomegally, no rebound, no guarding Ext:  2 plus pulses, no edema, no cyanosis, no clubbing Skin:  No rashes no nodules Neuro:  CN II through XII intact, motor grossly intact  EKG - NSR with p synchronous ventricular pacing  DEVICE  Normal device function.  See PaceArt for details.   Assess/Plan: 1, Autonomic dysfunction - this appears to be a new problem. I have asked that the patient increase her fluid intake and sit or lay down when she feels an episode coming on.  2. Chronic systolic heart failure - her symptoms are class 2. I have asked that the patient take her bumex only on an as needed basis due to #1. 3. PPM - her medtronic DDD PM is working normally. Will recheck in several months. 4. HTN - her blood pressure is not tightly controlled but I would like it to be a little high in the setting of  autonomic dysfunction. She is encouraged to increase liquid intake.  [name],M.D  [name].D.

## 2018-02-02 ENCOUNTER — Encounter: Payer: Self-pay | Admitting: Podiatry

## 2018-02-02 ENCOUNTER — Ambulatory Visit (INDEPENDENT_AMBULATORY_CARE_PROVIDER_SITE_OTHER): Payer: Medicare Other | Admitting: Podiatry

## 2018-02-02 DIAGNOSIS — M79676 Pain in unspecified toe(s): Secondary | ICD-10-CM | POA: Diagnosis not present

## 2018-02-02 DIAGNOSIS — B351 Tinea unguium: Secondary | ICD-10-CM | POA: Diagnosis not present

## 2018-02-02 NOTE — Progress Notes (Signed)
Subjective: 84  y.o. returns the office today for painful, elongated, thickened toenails which she cannot trim herself. Denies any redness or drainage around the nails. Denies any acute changes since last appointment and no new complaints today. Denies any systemic complaints such as fevers, chills, nausea, vomiting.   Objective: AAO 3, NAD DP/PT pulses palpable, CRT less than 3 seconds Nails hypertrophic, dystrophic, elongated, brittle, discolored 10. There is tenderness overlying the nails 1-5 bilaterally with the right hallux the worst. There is no surrounding erythema or drainage along the nail sites. No open lesions or pre-ulcerative lesions are identified. No other areas of tenderness bilateral lower extremities. No overlying edema, erythema, increased warmth. No pain with calf compression, swelling, warmth, erythema.  Assessment: Patient presents with symptomatic onychomycosis  Plan: -Treatment options including alternatives, risks, complications were discussed -Nails sharply debrided 10 without complication/bleeding.  -Discussed daily foot inspection. If there are any changes, to call the office immediately.  -Follow-up in 3 months or sooner if any problems are to arise. In the meantime, encouraged to call the office with any questions, concerns, changes symptoms.  Matthew Wagoner, DPM  

## 2018-02-04 LAB — CUP PACEART REMOTE DEVICE CHECK
Battery Remaining Longevity: 54 mo
Battery Voltage: 2.99 V
Brady Statistic AP VP Percent: 17.44 %
Brady Statistic AP VS Percent: 0.03 %
Brady Statistic AS VP Percent: 81.72 %
Brady Statistic AS VS Percent: 0.82 %
Date Time Interrogation Session: 20190220232916
Implantable Lead Implant Date: 20051007
Implantable Lead Implant Date: 20140528
Implantable Lead Location: 753858
Implantable Lead Model: 4194
Lead Channel Impedance Value: 399 Ohm
Lead Channel Impedance Value: 456 Ohm
Lead Channel Impedance Value: 456 Ohm
Lead Channel Impedance Value: 475 Ohm
Lead Channel Impedance Value: 570 Ohm
Lead Channel Pacing Threshold Amplitude: 0.5 V
Lead Channel Pacing Threshold Amplitude: 0.5 V
Lead Channel Pacing Threshold Amplitude: 1.125 V
Lead Channel Pacing Threshold Pulse Width: 0.4 ms
Lead Channel Setting Pacing Pulse Width: 0.4 ms
MDC IDC LEAD IMPLANT DT: 20140528
MDC IDC LEAD LOCATION: 753859
MDC IDC LEAD LOCATION: 753860
MDC IDC MSMT LEADCHNL LV IMPEDANCE VALUE: 304 Ohm
MDC IDC MSMT LEADCHNL LV IMPEDANCE VALUE: 399 Ohm
MDC IDC MSMT LEADCHNL LV IMPEDANCE VALUE: 570 Ohm
MDC IDC MSMT LEADCHNL LV PACING THRESHOLD PULSEWIDTH: 0.4 ms
MDC IDC MSMT LEADCHNL RA IMPEDANCE VALUE: 399 Ohm
MDC IDC MSMT LEADCHNL RA PACING THRESHOLD PULSEWIDTH: 0.4 ms
MDC IDC MSMT LEADCHNL RA SENSING INTR AMPL: 1.25 mV
MDC IDC MSMT LEADCHNL RA SENSING INTR AMPL: 1.25 mV
MDC IDC MSMT LEADCHNL RV SENSING INTR AMPL: 9.25 mV
MDC IDC MSMT LEADCHNL RV SENSING INTR AMPL: 9.25 mV
MDC IDC PG IMPLANT DT: 20140528
MDC IDC SET LEADCHNL LV PACING AMPLITUDE: 2.25 V
MDC IDC SET LEADCHNL RA PACING AMPLITUDE: 2 V
MDC IDC SET LEADCHNL RV PACING AMPLITUDE: 2 V
MDC IDC SET LEADCHNL RV PACING PULSEWIDTH: 0.4 ms
MDC IDC SET LEADCHNL RV SENSING SENSITIVITY: 0.9 mV
MDC IDC STAT BRADY RA PERCENT PACED: 17.42 %
MDC IDC STAT BRADY RV PERCENT PACED: 98.89 %

## 2018-03-11 ENCOUNTER — Emergency Department (HOSPITAL_COMMUNITY): Payer: Medicare Other

## 2018-03-11 ENCOUNTER — Telehealth: Payer: Self-pay | Admitting: Internal Medicine

## 2018-03-11 ENCOUNTER — Encounter: Payer: Self-pay | Admitting: Internal Medicine

## 2018-03-11 ENCOUNTER — Encounter (HOSPITAL_COMMUNITY): Payer: Self-pay | Admitting: Emergency Medicine

## 2018-03-11 ENCOUNTER — Ambulatory Visit (INDEPENDENT_AMBULATORY_CARE_PROVIDER_SITE_OTHER): Payer: Medicare Other | Admitting: Internal Medicine

## 2018-03-11 ENCOUNTER — Other Ambulatory Visit: Payer: Self-pay

## 2018-03-11 ENCOUNTER — Emergency Department (HOSPITAL_COMMUNITY)
Admission: EM | Admit: 2018-03-11 | Discharge: 2018-03-11 | Disposition: A | Discharge status: Home / Self Care | Payer: Medicare Other | Attending: Emergency Medicine | Admitting: Emergency Medicine

## 2018-03-11 VITALS — BP 120/46 | HR 80 | Temp 100.5°F | Resp 24

## 2018-03-11 DIAGNOSIS — J40 Bronchitis, not specified as acute or chronic: Secondary | ICD-10-CM | POA: Insufficient documentation

## 2018-03-11 DIAGNOSIS — Z7902 Long term (current) use of antithrombotics/antiplatelets: Secondary | ICD-10-CM | POA: Diagnosis not present

## 2018-03-11 DIAGNOSIS — Z7982 Long term (current) use of aspirin: Secondary | ICD-10-CM | POA: Insufficient documentation

## 2018-03-11 DIAGNOSIS — R5081 Fever presenting with conditions classified elsewhere: Secondary | ICD-10-CM | POA: Diagnosis not present

## 2018-03-11 DIAGNOSIS — R0981 Nasal congestion: Secondary | ICD-10-CM | POA: Insufficient documentation

## 2018-03-11 DIAGNOSIS — I2510 Atherosclerotic heart disease of native coronary artery without angina pectoris: Secondary | ICD-10-CM

## 2018-03-11 DIAGNOSIS — A419 Sepsis, unspecified organism: Secondary | ICD-10-CM | POA: Diagnosis not present

## 2018-03-11 DIAGNOSIS — I10 Essential (primary) hypertension: Secondary | ICD-10-CM | POA: Diagnosis not present

## 2018-03-11 DIAGNOSIS — N183 Chronic kidney disease, stage 3 (moderate): Secondary | ICD-10-CM | POA: Diagnosis not present

## 2018-03-11 DIAGNOSIS — Z7722 Contact with and (suspected) exposure to environmental tobacco smoke (acute) (chronic): Secondary | ICD-10-CM | POA: Diagnosis not present

## 2018-03-11 DIAGNOSIS — Z950 Presence of cardiac pacemaker: Secondary | ICD-10-CM | POA: Diagnosis not present

## 2018-03-11 DIAGNOSIS — R404 Transient alteration of awareness: Secondary | ICD-10-CM | POA: Diagnosis not present

## 2018-03-11 DIAGNOSIS — E860 Dehydration: Secondary | ICD-10-CM

## 2018-03-11 DIAGNOSIS — R509 Fever, unspecified: Secondary | ICD-10-CM

## 2018-03-11 DIAGNOSIS — E1122 Type 2 diabetes mellitus with diabetic chronic kidney disease: Secondary | ICD-10-CM | POA: Insufficient documentation

## 2018-03-11 DIAGNOSIS — I5022 Chronic systolic (congestive) heart failure: Secondary | ICD-10-CM | POA: Diagnosis not present

## 2018-03-11 DIAGNOSIS — J111 Influenza due to unidentified influenza virus with other respiratory manifestations: Secondary | ICD-10-CM | POA: Diagnosis not present

## 2018-03-11 DIAGNOSIS — I130 Hypertensive heart and chronic kidney disease with heart failure and stage 1 through stage 4 chronic kidney disease, or unspecified chronic kidney disease: Secondary | ICD-10-CM | POA: Insufficient documentation

## 2018-03-11 DIAGNOSIS — R531 Weakness: Secondary | ICD-10-CM | POA: Diagnosis not present

## 2018-03-11 LAB — CBC WITH DIFFERENTIAL/PLATELET
BASOS ABS: 0 10*3/uL (ref 0.0–0.1)
Basophils Relative: 0 %
EOS PCT: 0 %
Eosinophils Absolute: 0 10*3/uL (ref 0.0–0.7)
HEMATOCRIT: 36.5 % (ref 36.0–46.0)
Hemoglobin: 12.2 g/dL (ref 12.0–15.0)
LYMPHS ABS: 1.5 10*3/uL (ref 0.7–4.0)
LYMPHS PCT: 21 %
MCH: 30.1 pg (ref 26.0–34.0)
MCHC: 33.4 g/dL (ref 30.0–36.0)
MCV: 90.1 fL (ref 78.0–100.0)
MONO ABS: 0.7 10*3/uL (ref 0.1–1.0)
MONOS PCT: 9 %
NEUTROS ABS: 5 10*3/uL (ref 1.7–7.7)
Neutrophils Relative %: 70 %
PLATELETS: 192 10*3/uL (ref 150–400)
RBC: 4.05 MIL/uL (ref 3.87–5.11)
RDW: 12.7 % (ref 11.5–15.5)
WBC: 7.1 10*3/uL (ref 4.0–10.5)

## 2018-03-11 LAB — COMPREHENSIVE METABOLIC PANEL
ALT: 16 U/L (ref 14–54)
AST: 26 U/L (ref 15–41)
Albumin: 3.2 g/dL — ABNORMAL LOW (ref 3.5–5.0)
Alkaline Phosphatase: 43 U/L (ref 38–126)
Anion gap: 10 (ref 5–15)
BUN: 13 mg/dL (ref 6–20)
CO2: 22 mmol/L (ref 22–32)
Calcium: 8.5 mg/dL — ABNORMAL LOW (ref 8.9–10.3)
Chloride: 102 mmol/L (ref 101–111)
Creatinine, Ser: 1.32 mg/dL — ABNORMAL HIGH (ref 0.44–1.00)
GFR calc Af Amer: 40 mL/min — ABNORMAL LOW (ref >60)
GFR calc non Af Amer: 34 mL/min — ABNORMAL LOW (ref >60)
Glucose, Bld: 117 mg/dL — ABNORMAL HIGH (ref 65–99)
Potassium: 4.1 mmol/L (ref 3.5–5.1)
Sodium: 134 mmol/L — ABNORMAL LOW (ref 135–145)
Total Bilirubin: 0.9 mg/dL (ref 0.3–1.2)
Total Protein: 5.7 g/dL — ABNORMAL LOW (ref 6.5–8.1)

## 2018-03-11 LAB — INFLUENZA PANEL BY PCR (TYPE A & B)
Influenza A By PCR: NEGATIVE
Influenza B By PCR: NEGATIVE

## 2018-03-11 LAB — URINALYSIS, ROUTINE W REFLEX MICROSCOPIC
Bilirubin Urine: NEGATIVE
GLUCOSE, UA: NEGATIVE mg/dL
Hgb urine dipstick: NEGATIVE
KETONES UR: NEGATIVE mg/dL
Nitrite: NEGATIVE
PH: 6 (ref 5.0–8.0)
Protein, ur: NEGATIVE mg/dL
Specific Gravity, Urine: 1.004 — ABNORMAL LOW (ref 1.005–1.030)

## 2018-03-11 LAB — I-STAT CG4 LACTIC ACID, ED
LACTIC ACID, VENOUS: 0.72 mmol/L (ref 0.5–1.9)
Lactic Acid, Venous: 0.85 mmol/L (ref 0.5–1.9)

## 2018-03-11 LAB — I-STAT TROPONIN, ED: TROPONIN I, POC: 0.01 ng/mL (ref 0.00–0.08)

## 2018-03-11 LAB — BRAIN NATRIURETIC PEPTIDE: B Natriuretic Peptide: 392.8 pg/mL — ABNORMAL HIGH (ref 0.0–100.0)

## 2018-03-11 MED ORDER — SODIUM CHLORIDE 0.9 % IV BOLUS
500.0000 mL | Freq: Once | INTRAVENOUS | Status: AC
  Administered 2018-03-11: 500 mL via INTRAVENOUS

## 2018-03-11 NOTE — Telephone Encounter (Signed)
Myrtie Hawk (patient daughter) calling,  States that patient is in ED, patient had SOB, weak, and BP was dropping. Izora Gala wanted to make DR. Lovena Le aware

## 2018-03-11 NOTE — ED Provider Notes (Signed)
[facility] EMERGENCY DEPARTMENT Provider Note  CSN: [id number] Arrival date & time: 03/11/18 1046  Chief Complaint(s) Weakness  HPI [name] is a 84 y.o. female who presents from PCPs office after being evaluated for 4-5 days of URI symptoms.  Patient was febrile at the PCPs office and given Tylenol.  In the note, patient was noted to be hemodynamically stable.  Report by EMS states that she had some orthostatic changes at the office which were not noted by the PCP.  Patient was sent here for evaluation of possible pneumonia versus urosepsis.  Patient is only been complaining of subjective fevers, congestion and chills for several days.  Reports oral hydration however friends and family state that she does not drink very much fluids.  He denies any vomiting or diarrhea.  No abdominal pain.  No chest pain or shortness of breath.  Patient does endorse generalized fatigue.  Denies any known sick contacts.  HPI  Past Medical History Past Medical History:  Diagnosis Date  . A-fib ([place])   . Biventricular cardiac pacemaker in situ    05/06/13  Old AICD explanted and 6949 lead capped  RV lead  Medtronic 5076 [id number]  LV lead  Medtronic 4194  [id number] V Medtronic biventricular pacer  PVX [id number]   . Diabetes mellitus without complication ([place])   . GERD (gastroesophageal reflux disease)   . History of duodenal ulcer   . History of esophageal stricture   . Hyperlipidemia   . Hypertension   . Occlusion and stenosis of carotid artery without mention of cerebral infarction   . Peripheral neuropathy   . Right groin pain 12/12/2014  . Secondary cardiomyopathy, unspecified   . Transient global amnesia    Patient Active Problem List   Diagnosis Date Noted  . ASHD (arteriosclerotic heart disease) 12/05/2014  . GERD (gastroesophageal reflux disease) 12/05/2014  . Prediabetes 05/21/2014  . Vitamin D deficiency 05/20/2014  . Medication management 05/20/2014  . CKD (chronic  kidney disease), stage III ([place]) 06/21/2013  . History of vasodepressor syncope   . Biventricular cardiac pacemaker in situ   . Mixed hyperlipidemia   . Hypertension   . LBBB   . Chronic systolic heart failure ([place])   . Dilated cardiomyopathy, nonischemic    Home Medication(s) Prior to Admission medications   Medication Sig Start Date End Date Taking? Authorizing Provider  aspirin EC 81 MG tablet Take 81 mg by mouth daily.   Yes [provider]  b complex vitamins capsule Take 1 capsule by mouth daily.    Yes [provider]  bumetanide (BUMEX) 1 MG tablet Take 1/2 tablet as needed for edema. 01/30/18  Yes Evans Lance, MD  carvedilol (COREG) 6.25 MG tablet Take 6.25 mg by mouth 2 (two) times daily with a meal.   Yes [provider]  Cholecalciferol (VITAMIN D) 2000 units CAPS Take 4,000 Units by mouth daily.    Yes [provider]  clopidogrel (PLAVIX) 75 MG tablet TAKE 1 TABLET BY MOUTH EVERY DAY TO PREVENT HEART ATTACK 05/08/17  Yes Unk Pinto, MD  gabapentin (NEURONTIN) 600 MG tablet TAKE 1 TABLET BY MOUTH AT BEDTIME FOR NEUROPATHY 10/22/17  Yes Unk Pinto, MD  losartan (COZAAR) 50 MG tablet Take 25 mg by mouth daily.    Yes [provider]  magnesium gluconate (MAGONATE) 500 MG tablet Take 500 mg by mouth daily.    Yes [provider]  ranitidine (ZANTAC) 300 MG tablet TAKE 1 TABLET TWICE  A DAY FOR REFLUX 01/24/18  Yes Unk Pinto, MD                                                                                                                                    Past Surgical History Past Surgical History:  Procedure Laterality Date  . ABDOMINAL HYSTERECTOMY    . APPENDECTOMY    . BI-VENTRICULAR PACEMAKER UPGRADE N/A 05/05/2013   Procedure: BI-VENTRICULAR PACEMAKER UPGRADE;  Surgeon: Evans Lance, MD;  Location: [facility] CATH LAB;  Service: Cardiovascular;  Laterality: N/A;  . BIV PACEMAKER GENERATOR CHANGE OUT   05/05/2013   MDT CRTP upgrade (previously implanted dual chamber ICD) by Dr Lovena Le.  New RV/LV leads placed.   Marland Kitchen BREAST SURGERY Left 1985   Negative  Biopsy  . CARDIAC CATHETERIZATION     04/2003  . CARDIAC DEFIBRILLATOR PLACEMENT  09/2004   MDT ICD implanted for primary prevention; device upgrade to CRTP in 04-2013  . CARPAL TUNNEL RELEASE    . CHOLECYSTECTOMY     Family History Family History  Problem Relation Age of Onset  . Dementia Mother   . Diabetes Mother   . Heart Problems Father   . Heart attack Son   . Heart disease Son   . Heart disease Brother   . Heart attack Brother   . Cancer Maternal Grandmother        breast  . Stroke Daughter   . Hypertension Daughter   . Hyperlipidemia Daughter   . Diabetes Daughter   . Diabetes Sister   . Hypertension Sister   . Hyperlipidemia Sister   . Hypertension Sister   . Hyperlipidemia Sister   . Cancer Sister        Breast  . Heart Problems Sister     Social History Social History   Tobacco Use  . Smoking status: Passive Smoke Exposure - Never Smoker  . Smokeless tobacco: Never Used  . Tobacco comment: worked at [employer] Topics  . Alcohol use: No    Alcohol/week: 0.0 oz  . Drug use: No   Allergies Nexium [esomeprazole magnesium]  Review of Systems Review of Systems All other systems are reviewed and are negative for acute change except as noted in the HPI  Physical Exam Vital Signs  I have reviewed the triage vital signs BP (!) 125/49   Pulse 68   Temp 98.7 F (37.1 C) (Oral)   Resp (!) 23   SpO2 93%   Physical Exam  Constitutional: She is oriented to person, place, and time. She appears well-developed and well-nourished. No distress.  HENT:  Head: Normocephalic and atraumatic.  Nose: Nose normal.  Eyes: Pupils are equal, round, and reactive to light. Conjunctivae and EOM are normal. Right eye exhibits no discharge. Left eye exhibits no discharge. No scleral icterus.  Neck: Normal range  of motion. Neck supple.  Cardiovascular: Normal rate and regular rhythm. Exam  reveals no gallop and no friction rub.  No murmur heard. Pulmonary/Chest: Effort normal and breath sounds normal. No stridor. No respiratory distress. She has no rales.  Course lung sounds that clear with couging.  Abdominal: Soft. She exhibits no distension. There is no tenderness.  Musculoskeletal: She exhibits no edema or tenderness.  Neurological: She is alert and oriented to person, place, and time.  Skin: Skin is warm and dry. No rash noted. She is not diaphoretic. No erythema.  Psychiatric: She has a normal mood and affect.  Vitals reviewed.   ED Results and Treatments Labs (all labs ordered are listed, but only abnormal results are displayed) Labs Reviewed  COMPREHENSIVE METABOLIC PANEL - Abnormal; Notable for the following components:      Result Value   Sodium 134 (*)    Glucose, Bld 117 (*)    Creatinine, Ser 1.32 (*)    Calcium 8.5 (*)    Total Protein 5.7 (*)    Albumin 3.2 (*)    GFR calc non Af Amer 34 (*)    GFR calc Af Amer 40 (*)    All other components within normal limits  URINALYSIS, ROUTINE W REFLEX MICROSCOPIC - Abnormal; Notable for the following components:   Specific Gravity, Urine 1.004 (*)    Leukocytes, UA TRACE (*)    Bacteria, UA MANY (*)    Squamous Epithelial / LPF 0-5 (*)    All other components within normal limits  BRAIN NATRIURETIC PEPTIDE - Abnormal; Notable for the following components:   B Natriuretic Peptide 392.8 (*)    All other components within normal limits  CULTURE, BLOOD (ROUTINE X 2)  CULTURE, BLOOD (ROUTINE X 2)  URINE CULTURE  CBC WITH DIFFERENTIAL/PLATELET  INFLUENZA PANEL BY PCR (TYPE A & B)  I-STAT CG4 LACTIC ACID, ED  I-STAT TROPONIN, ED  I-STAT CG4 LACTIC ACID, ED                                                                                                                         EKG  EKG Interpretation  Date/Time:  Wednesday March 11 2018 10:55:18 EDT Ventricular Rate:  71 PR Interval:    QRS Duration: 135 QT Interval:  475 QTC Calculation: 517 R Axis:   -77 Text Interpretation:  Sinus rhythm IVCD, consider atypical RBBB LVH with secondary repolarization abnormality Anterior infarct, old No significant change since last tracing Confirmed by Addison Lank [phone]) on 03/11/2018 11:00:44 AM Also confirmed by Addison Lank [phone]), editor Philomena Doheny [phone])  on 03/11/2018 12:33:09 PM      Radiology Dg Chest 2 View  Result Date: 03/11/2018 CLINICAL DATA:  84 year old female with weakness and flu like symptoms for 1 week. Fever of 101.7. EXAM: CHEST - 2 VIEW COMPARISON:  Chest radiographs 09/06/2017 and earlier. FINDINGS: Semi upright AP and lateral views of the chest. Stable cardiomegaly and mediastinal contours. Stable somewhat low lung volumes. Stable left chest cardiac AICD. No pneumothorax, pulmonary edema, pleural effusion  or consolidation. Mild lower lobe atelectasis suspected and stable to the 2018 comparison. No acute osseous abnormality identified. Negative visible bowel gas pattern. IMPRESSION: Stable.  No acute cardiopulmonary abnormality identified. Electronically Signed   By: Genevie Ann M.D.   On: 03/11/2018 12:39   Pertinent labs & imaging results that were available during my care of the patient were reviewed by me and considered in my medical decision making (see chart for details).  Medications Ordered in ED Medications  sodium chloride 0.9 % bolus 500 mL (500 mLs Intravenous New Bag/Given 03/11/18 1217)                                                                                                                                    Procedures Procedures  (including critical care time)  Medical Decision Making / ED Course I have reviewed the nursing notes for this encounter and the patient's prior records (if available in EHR or on provided paperwork).    Work up negative for PNA, influenza, or UTI. Likely  viral process. Has remained HDS here. Orthostatics reassuring prior to hydration.   Presentation not suggestive for CHF exacerbation.  Labs at patient's baseline and grossly reassuring.  Has been hydrating here and tolerating PO.   The patient appears reasonably screened and/or stabilized for discharge and I doubt any other medical condition or other [facility] requiring further screening, evaluation, or treatment in the ED at this time prior to discharge.  The patient is safe for discharge with strict return precautions.   Final Clinical Impression(s) / ED Diagnoses Final diagnoses:  Fever in other diseases  Bronchitis   Disposition: Discharge  Condition: Good  I have discussed the results, Dx and Tx plan with the patient who expressed understanding and agree(s) with the plan. Discharge instructions discussed at great length. The patient was given strict return precautions who verbalized understanding of the instructions. No further questions at time of discharge.    ED Discharge Orders    None       Follow Up: Unk Pinto, MD [address] [place] [address] [phone]  Schedule an appointment as soon as possible for a visit  in 3-5 days, If symptoms do not improve or  worsen      This chart was dictated using voice recognition software.  Despite best efforts to proofread,  errors can occur which can change the documentation meaning.   Fatima Blank, MD 03/11/18 1550

## 2018-03-11 NOTE — Discharge Instructions (Signed)
You may take over-the-counter medicine for symptomatic relief, such as Tylenol, Motrin, TheraFlu, Alka seltzer , black elderberry, etc. Please limit acetaminophen (Tylenol) to 4000 mg and Ibuprofen (Motrin, Advil, etc.) to 2400 mg for a 24hr period. Please note that other over-the-counter medicine may contain acetaminophen or ibuprofen as a component of their ingredients.   

## 2018-03-11 NOTE — ED Notes (Signed)
Patient transported to X-ray 

## 2018-03-11 NOTE — Progress Notes (Signed)
[facility]   Unk Pinto, M.D.    Uvaldo Bristle. Silverio Lay, P.A.-C      [address], [address]                [place], [place] [address] Telephone [phone] Telefax [phone]  Subjective:    Patient ID: [name], female    DOB: 11-09-1925, 84 y.o.   MRN: [id number]  HPI  This very nice 84  Yo WWF with HTN, ASHD/AICD presents with a 4-5 day prodrome of fever , congestion, chills and confusion.  Medication Sig  . aspirin EC 81 MG  Takedaily.  Marland Kitchen b complex vitamins  Take 1 cap daily.   . Bumetanide 1 MG Take 1/2 tab as needed   . carvedilol  6.25 MG Take 2  times daily with a meal.  . VIT D 2000 units  Take 4,000 Units daily.   . clopidogrel  75 MG TAKE 1 TAB EVERY DAY   . gabapentin  600 MG TAKE 1 TAB AT BEDTIME   . losartan50 MG  Take 25 mg daily.   . magnesium  500 MG  Take  daily.   . ranitidine  300 MG  TAKE 1 TAB TWICE A DAY   Allergies  Allergen Reactions  . Nexium [Esomeprazole Magnesium] Diarrhea   Past Medical History:  Diagnosis Date  . A-fib ([place])   . Biventricular cardiac pacemaker in situ    05/06/13  Old AICD explanted and 6949 lead capped  RV lead  Medtronic 5076 [id number]  LV lead  Medtronic 4194  [id number] V Medtronic biventricular pacer  PVX [id number]   . Diabetes mellitus without complication ([place])   . GERD (gastroesophageal reflux disease)   . History of duodenal ulcer   . History of esophageal stricture   . Hyperlipidemia   . Hypertension   . Occlusion and stenosis of carotid artery without mention of cerebral infarction   . Peripheral neuropathy   . Right groin pain 12/12/2014  . Secondary cardiomyopathy, unspecified   . Transient global amnesia    Past Surgical History:  Procedure Laterality Date  . ABDOMINAL HYSTERECTOMY    . APPENDECTOMY    . BI-VENTRICULAR PACEMAKER UPGRADE N/A 05/05/2013   Procedure: BI-VENTRICULAR PACEMAKER UPGRADE;   Surgeon: Evans Lance, MD;  Location: [facility] CATH LAB;  Service: Cardiovascular;  Laterality: N/A;  . BIV PACEMAKER GENERATOR CHANGE OUT  05/05/2013   MDT CRTP upgrade (previously implanted dual chamber ICD) by Dr Lovena Le.  New RV/LV leads placed.   Marland Kitchen BREAST SURGERY Left 1985   Negative  Biopsy  . CARDIAC CATHETERIZATION     04/2003  . CARDIAC DEFIBRILLATOR PLACEMENT  09/2004   MDT ICD implanted for primary prevention; device upgrade to CRTP in 04-2013  . CARPAL TUNNEL RELEASE    . CHOLECYSTECTOMY     Review of Systems   Unreliable consequent of patient's obtundation    Objective:   Physical Exam  BP (!) 120/46   Pulse 80   Temp (!) 100.5 F (38.1 C)   Resp (!) 24    Postural Sitting  BP 120/60  P 60      & Standing BP unobtainable   Lethargic, confused. O2 sat 93%.  Skin - warm, dry, decreased turgor with no rash, cyanosis or icterus HEENT - WNL. MM dry. Neck - supple.  Chest -  scattered coarse rhonchi.  Cor - Nl HS. RRR w/o sig m.  No edema. Abd - Soft  MS- FROM w/o deformities.  Gait Nl. Neuro -  Lethargic. No focal signs.Nl w/o focal abnormalities.    Assessment & Plan:   1. Fever, unspecified fever cause  - r/o PNA, Urosepsis  2. Dehydration  3. Sepsis, suspected  .......................................................................  - Routed to [facility] ER for Evaluation

## 2018-03-11 NOTE — ED Triage Notes (Signed)
Per EMS: pt from PCP with c/o weakness and flu like symptoms x1 week. Pt was positive for orthostatic changes at PCP office this morning.  Febrile with a oral temp of 101.7.  Pt A/O on arrival to ED and in NAD.

## 2018-03-12 NOTE — Telephone Encounter (Signed)
APP made aware yesterday Pt in hospital.  Pt with non cardiac issues.  Suspected viral.  Pt treated and discharged.  No action needed.

## 2018-03-13 LAB — URINE CULTURE

## 2018-03-14 ENCOUNTER — Telehealth: Payer: Self-pay

## 2018-03-14 NOTE — Telephone Encounter (Signed)
No treatment for [facility] ED 03/11/18 per [facility] Lanius PA

## 2018-03-15 NOTE — Progress Notes (Signed)
Subjective:    Patient ID: [name], female    DOB: 02-Dec-1925, 84 y.o.   MRN: [id number]  HPI                       This very nice 84 yo WWF with HTN, ASHD presented on April 3 with Fever (T 100.5)  and orthostasis with Standing BP undetectable. She was referred to the ER to r/o Sepsis, PNA, etc. Initial CXR and Lab work-up was unrevealing with Negative test for Flu & patient was given IVF and BP's improved and she was dx'd w/Bronchitis and released with other treatments. U/C subsequently returned  (+) for Klebsiella UTI which was not treated.  Today, she's brought in by her daughter and patient denies any urinary sx's, but is c/o increased cough & chest congestion.   Medication Sig  . aspirin EC 81 MG Take  daily.  Marland Kitchen b complex vitamins  Take 1 cap daily.   . bumetanide  1 MG  Take 1/2 tablet as needed for edema.  . carvedilol  6.25 MG  Take 2  times daily with a meal.  . VITAMIN D 2000 units Take 4,000 Unitsdaily.   . clopidogrel 75 MG  TAKE 1 TAB  EVERY DAY   . gabapentin  600 MG  TAKE 1 TAB AT BEDTIME  . losartan 50 MG Take 25 mgdaily.   . magnesium  500 MG  Take 500 mg  daily.   . ranitidine  300 MG  TAKE 1 TAB TWICE A DAY FOR REFLUX   Allergies  Allergen Reactions  . Nexium [Esomeprazole Magnesium] Diarrhea   Past Medical History:  Diagnosis Date  . A-fib ([place])   . Biventricular cardiac pacemaker in situ    05/06/13  Old AICD explanted and 6949 lead capped  RV lead  Medtronic 5076 [id number]  LV lead  Medtronic 4194  [id number] V Medtronic biventricular pacer  PVX [id number]   . Diabetes mellitus without complication ([place])   . GERD (gastroesophageal reflux disease)   . History of duodenal ulcer   . History of esophageal stricture   . Hyperlipidemia   . Hypertension   . Occlusion and stenosis of carotid artery without mention of cerebral infarction   . Peripheral neuropathy   . Right groin pain 12/12/2014  . Secondary cardiomyopathy, unspecified   . Transient global amnesia     Past Surgical History:  Procedure Laterality Date  . ABDOMINAL HYSTERECTOMY    . APPENDECTOMY    . BI-VENTRICULAR PACEMAKER UPGRADE N/A 05/05/2013   Procedure: BI-VENTRICULAR PACEMAKER UPGRADE;  Surgeon: Evans Lance, MD;  Location: [facility] CATH LAB;  Service: Cardiovascular;  Laterality: N/A;  . BIV PACEMAKER GENERATOR CHANGE OUT  05/05/2013   MDT CRTP upgrade (previously implanted dual chamber ICD) by Dr Lovena Le.  New RV/LV leads placed.   Marland Kitchen BREAST SURGERY Left 1985   Negative  Biopsy  . CARDIAC CATHETERIZATION     04/2003  . CARDIAC DEFIBRILLATOR PLACEMENT  09/2004   MDT ICD implanted for primary prevention; device upgrade to CRTP in 04-2013  . CARPAL TUNNEL RELEASE    . CHOLECYSTECTOMY     Review of Systems  10 point systems review negative except as above.    Objective:   Physical Exam  BP (!) 148/60   Pulse 72   Temp (!) 97.4 F (36.3 C)   Resp 16   Ht 5\' 3"  (1.6 m)   Wt 145 lb (65.8 kg)  BMI 25.69 kg/m   O2 sat 96%  Orthostatic Sitting BP 146/78  P 65    And standing BP 134/82   P 72  In No Distress. No Stridor.  HEENT - Eac's patent. TM's Nl. EOM's full. PERRLA. NasoOroPharynx clear. Neck - supple. Nl Thyroid. Carotids 2+ & No bruits, nodes, JVD Chest - Scattered coarse rales & rhonchi and few post tussive forced exp. Wheezes. wheezes. Cor - Nl HS. RRR w/o sig MGR. PP 1(+). No edema. Abd - No palpable organomegaly, masses or tenderness. BS nl. MS- FROM w/o deformities. Muscle power, tone and bulk Nl. Gait Nl. Neuro - No obvious Cr N abnormalities.  Nl w/o focal abnormalities. Skin - exposed clear w/o rash cyanosis or icterus.    Assessment & Plan:   1. Bronchitis  - predniSONE (DELTASONE) 20 MG tablet; 1 tab 3 x day for 3 days, then 1 tab 2 x day for 3 days, then 1 tab 1 x day for 5 days  Dispense: 20 tablet; Refill: 0  - levofloxacin (LEVAQUIN) 500 MG tablet; Take 1 tablet daily with food for infection  Dispense: 15 tablet;   - promethazine-dextromethorphan  (PROMETHAZINE-DM) 6.25-15 MG/5ML syrup; Take 1 to 2 tsp enery 4 hours if needed for cough  Dispense: 360 mL; Refill: 1  2. UTI due to Klebsiella species  - Urinalysis, Routine w reflex microscopic - Urine Culture  - levofloxacin (LEVAQUIN) 500 MG tablet; Take 1 tablet daily with food for infection  Dispense: 15 tablet;   3. Essential hypertension

## 2018-03-15 NOTE — Patient Instructions (Signed)
Urinary Tract Infection  A urinary tract infection (UTI) is an infection of any part of the urinary tract, which includes the kidneys, ureters, bladder, and urethra. These organs make, store, and get rid of urine in the body. UTI can be a bladder infection (cystitis) or kidney infection (pyelonephritis).  What are the causes?  This infection may be caused by fungi, viruses, or bacteria. Bacteria are the most common cause of UTIs. This condition can also be caused by repeated incomplete emptying of the bladder during urination.  What increases the risk?  This condition is more likely to develop if:  You ignore your need to urinate or hold urine for long periods of time.  You do not empty your bladder completely during urination.  You wipe back to front after urinating or having a bowel movement, if you are female.  You are uncircumcised, if you are female.  You are constipated.  You have a urinary catheter that stays in place (indwelling).  You have a weak defense (immune) system.  You have a medical condition that affects your bowels, kidneys, or bladder.  You have diabetes.  You take antibiotic medicines frequently or for long periods of time, and the antibiotics no longer work well against certain types of infections (antibiotic resistance).  You take medicines that irritate your urinary tract.  You are exposed to chemicals that irritate your urinary tract.  You are female.  What are the signs or symptoms?  Symptoms of this condition include:  Fever.  Frequent urination or passing small amounts of urine frequently.  Needing to urinate urgently.  Pain or burning with urination.  Urine that smells bad or unusual.  Cloudy urine.  Pain in the lower abdomen or back.  Trouble urinating.  Blood in the urine.  Vomiting or being less hungry than normal.  Diarrhea or abdominal pain.  Vaginal discharge, if you are female.  How is this diagnosed?  This  condition is diagnosed with a medical history and physical exam. You will also need to provide a urine sample to test your urine. Other tests may be done, including:  Blood tests.  Sexually transmitted disease (STD) testing.  If you have had more than one UTI, a cystoscopy or imaging studies may be done to determine the cause of the infections.  How is this treated?  Treatment for this condition often includes a combination of two or more of the following:  Antibiotic medicine.  Other medicines to treat less common causes of UTI.  Over-the-counter medicines to treat pain.  Drinking enough water to stay hydrated.  Follow these instructions at home:   Take over-the-counter and prescription medicines only as told by your health care provider.  Avoid alcohol, caffeine, tea, and carbonated beverages. They can irritate your bladder.  Drink enough fluid to keep your urine clear or pale yellow.  Keep all follow-up visits as told by your health care provider. This is important.  Make sure to: ? Empty your bladder often and completely. Do not hold urine for long periods of time. ? Empty your bladder before and after sex. ? Wipe from front to back after a bowel movement if you are female. Use each tissue one time when you wipe. ?  Contact a health care provider if:  You have back pain.  You have a fever.  You feel nauseous or vomit.  Your symptoms do not get better after 3 days.  Your symptoms go away and then return. Get help right away if:  You have severe back pain or lower abdominal pain.  You are vomiting and cannot keep down any medicines or water.

## 2018-03-16 ENCOUNTER — Encounter: Payer: Self-pay | Admitting: Internal Medicine

## 2018-03-16 ENCOUNTER — Ambulatory Visit (INDEPENDENT_AMBULATORY_CARE_PROVIDER_SITE_OTHER): Payer: Medicare Other | Admitting: Internal Medicine

## 2018-03-16 VITALS — BP 148/60 | HR 72 | Temp 97.4°F | Resp 16 | Ht 63.0 in | Wt 145.0 lb

## 2018-03-16 DIAGNOSIS — N390 Urinary tract infection, site not specified: Secondary | ICD-10-CM

## 2018-03-16 DIAGNOSIS — J40 Bronchitis, not specified as acute or chronic: Secondary | ICD-10-CM

## 2018-03-16 DIAGNOSIS — I2510 Atherosclerotic heart disease of native coronary artery without angina pectoris: Secondary | ICD-10-CM

## 2018-03-16 DIAGNOSIS — B961 Klebsiella pneumoniae [K. pneumoniae] as the cause of diseases classified elsewhere: Secondary | ICD-10-CM | POA: Diagnosis not present

## 2018-03-16 DIAGNOSIS — I10 Essential (primary) hypertension: Secondary | ICD-10-CM

## 2018-03-16 LAB — CULTURE, BLOOD (ROUTINE X 2)
CULTURE: NO GROWTH
Culture: NO GROWTH
SPECIAL REQUESTS: ADEQUATE
Special Requests: ADEQUATE

## 2018-03-16 MED ORDER — PREDNISONE 20 MG PO TABS: ORAL_TABLET | ORAL | 0 refills | Status: DC

## 2018-03-16 MED ORDER — LEVOFLOXACIN 500 MG PO TABS: ORAL_TABLET | ORAL | 0 refills | Status: DC

## 2018-03-16 MED ORDER — PROMETHAZINE-DM 6.25-15 MG/5ML PO SYRP: ORAL_SOLUTION | ORAL | 1 refills | Status: DC

## 2018-03-17 ENCOUNTER — Telehealth: Payer: Self-pay | Admitting: *Deleted

## 2018-03-17 ENCOUNTER — Other Ambulatory Visit: Payer: Self-pay | Admitting: *Deleted

## 2018-03-17 MED ORDER — BUMETANIDE 1 MG PO TABS: ORAL_TABLET | ORAL | 1 refills | Status: DC

## 2018-03-17 NOTE — Telephone Encounter (Signed)
Daughter, [name] called and asked if the patient nedds to continue her BP medication, Carvedilol and Losartan, since she has had a low BP in the past.  Per Dr Melford Aase, she should continue the medication due to the patient's BP being in a good range at her last visit.

## 2018-03-18 LAB — URINALYSIS, ROUTINE W REFLEX MICROSCOPIC
Bacteria, UA: NONE SEEN /HPF
Bilirubin Urine: NEGATIVE
GLUCOSE, UA: NEGATIVE
HGB URINE DIPSTICK: NEGATIVE
HYALINE CAST: NONE SEEN /LPF
Ketones, ur: NEGATIVE
Nitrite: NEGATIVE
PH: 5.5 (ref 5.0–8.0)
Protein, ur: NEGATIVE
RBC / HPF: NONE SEEN /HPF (ref 0–2)
Specific Gravity, Urine: 1.006 (ref 1.001–1.03)
Squamous Epithelial / LPF: NONE SEEN /HPF (ref <=5)

## 2018-03-18 LAB — URINE CULTURE
MICRO NUMBER:: 90430848
SPECIMEN QUALITY:: ADEQUATE

## 2018-03-22 DIAGNOSIS — R404 Transient alteration of awareness: Secondary | ICD-10-CM | POA: Diagnosis not present

## 2018-03-22 DIAGNOSIS — R531 Weakness: Secondary | ICD-10-CM | POA: Diagnosis not present

## 2018-03-23 ENCOUNTER — Telehealth: Payer: Self-pay | Admitting: *Deleted

## 2018-03-23 NOTE — Telephone Encounter (Signed)
Izora Gala, the patient's daughter, called and reported the patient is staying with her in [place].  The patient is having more frequent episodes of weakness, fatigue and near fainting.  EMS were called to the daughter's home on 03/22/2018 to evaluate the patient. The patient refused to be transported to the hospital.  Per Dr Melford Aase, the patient needs to be evaluated and labs drawn.  The daughter was advised to force fluids and possibly call her doctor in [facility] for suggestions on ER facilities that would evaluate the patient.  The patient has appointment here on 04/01/2018.

## 2018-03-26 DIAGNOSIS — R262 Difficulty in walking, not elsewhere classified: Secondary | ICD-10-CM | POA: Diagnosis not present

## 2018-03-26 DIAGNOSIS — I10 Essential (primary) hypertension: Secondary | ICD-10-CM | POA: Diagnosis not present

## 2018-03-26 DIAGNOSIS — Z79899 Other long term (current) drug therapy: Secondary | ICD-10-CM | POA: Diagnosis not present

## 2018-03-26 DIAGNOSIS — I429 Cardiomyopathy, unspecified: Secondary | ICD-10-CM | POA: Diagnosis not present

## 2018-03-26 DIAGNOSIS — Z7982 Long term (current) use of aspirin: Secondary | ICD-10-CM | POA: Diagnosis not present

## 2018-03-26 DIAGNOSIS — R55 Syncope and collapse: Secondary | ICD-10-CM | POA: Diagnosis not present

## 2018-03-26 DIAGNOSIS — E871 Hypo-osmolality and hyponatremia: Secondary | ICD-10-CM | POA: Diagnosis not present

## 2018-03-26 DIAGNOSIS — I6529 Occlusion and stenosis of unspecified carotid artery: Secondary | ICD-10-CM | POA: Diagnosis not present

## 2018-03-26 DIAGNOSIS — E785 Hyperlipidemia, unspecified: Secondary | ICD-10-CM | POA: Diagnosis not present

## 2018-03-26 DIAGNOSIS — R109 Unspecified abdominal pain: Secondary | ICD-10-CM | POA: Diagnosis not present

## 2018-03-26 DIAGNOSIS — M6281 Muscle weakness (generalized): Secondary | ICD-10-CM | POA: Diagnosis not present

## 2018-03-26 DIAGNOSIS — R269 Unspecified abnormalities of gait and mobility: Secondary | ICD-10-CM | POA: Diagnosis not present

## 2018-03-26 DIAGNOSIS — R531 Weakness: Secondary | ICD-10-CM | POA: Diagnosis not present

## 2018-03-26 DIAGNOSIS — G629 Polyneuropathy, unspecified: Secondary | ICD-10-CM | POA: Diagnosis not present

## 2018-03-26 DIAGNOSIS — Z950 Presence of cardiac pacemaker: Secondary | ICD-10-CM | POA: Diagnosis not present

## 2018-03-27 DIAGNOSIS — I10 Essential (primary) hypertension: Secondary | ICD-10-CM | POA: Diagnosis not present

## 2018-03-27 DIAGNOSIS — R55 Syncope and collapse: Secondary | ICD-10-CM | POA: Diagnosis not present

## 2018-03-27 DIAGNOSIS — R531 Weakness: Secondary | ICD-10-CM | POA: Diagnosis not present

## 2018-03-27 DIAGNOSIS — I061 Rheumatic aortic insufficiency: Secondary | ICD-10-CM | POA: Diagnosis not present

## 2018-04-01 ENCOUNTER — Encounter: Payer: Self-pay | Admitting: Internal Medicine

## 2018-04-01 ENCOUNTER — Ambulatory Visit (INDEPENDENT_AMBULATORY_CARE_PROVIDER_SITE_OTHER): Payer: Medicare Other | Admitting: Internal Medicine

## 2018-04-01 VITALS — BP 124/62 | HR 72 | Temp 97.5°F | Resp 16 | Ht 63.0 in | Wt 142.6 lb

## 2018-04-01 DIAGNOSIS — I2510 Atherosclerotic heart disease of native coronary artery without angina pectoris: Secondary | ICD-10-CM

## 2018-04-01 DIAGNOSIS — Z79899 Other long term (current) drug therapy: Secondary | ICD-10-CM

## 2018-04-01 DIAGNOSIS — Z8249 Family history of ischemic heart disease and other diseases of the circulatory system: Secondary | ICD-10-CM

## 2018-04-01 DIAGNOSIS — G459 Transient cerebral ischemic attack, unspecified: Secondary | ICD-10-CM

## 2018-04-01 DIAGNOSIS — R7303 Prediabetes: Secondary | ICD-10-CM

## 2018-04-01 DIAGNOSIS — E559 Vitamin D deficiency, unspecified: Secondary | ICD-10-CM

## 2018-04-01 DIAGNOSIS — Z136 Encounter for screening for cardiovascular disorders: Secondary | ICD-10-CM

## 2018-04-01 DIAGNOSIS — I10 Essential (primary) hypertension: Secondary | ICD-10-CM

## 2018-04-01 DIAGNOSIS — Z950 Presence of cardiac pacemaker: Secondary | ICD-10-CM

## 2018-04-01 DIAGNOSIS — N3090 Cystitis, unspecified without hematuria: Secondary | ICD-10-CM | POA: Diagnosis not present

## 2018-04-01 DIAGNOSIS — R7309 Other abnormal glucose: Secondary | ICD-10-CM | POA: Diagnosis not present

## 2018-04-01 DIAGNOSIS — R2681 Unsteadiness on feet: Secondary | ICD-10-CM | POA: Diagnosis not present

## 2018-04-01 DIAGNOSIS — E782 Mixed hyperlipidemia: Secondary | ICD-10-CM | POA: Diagnosis not present

## 2018-04-01 NOTE — Progress Notes (Signed)
[facility] Unk Pinto, M.D.     Uvaldo Bristle. Silverio Lay, P.A.-C Liane Comber, [place] [address] [place] [address] Telephone [phone] Telefax [phone]   Comprehensive Evaluation &  Examination     This very nice 84 y.o. [facility]  presents for a  comprehensive evaluation and management of multiple medical co-morbidities.  Patient has been followed for HTN, ASHD, HLD, Prediabetes  and Vitamin D Deficiency. Patient apparently has been having issues with unstable gait & falling.       HTN predates circa 1988. Patient had a PPM planted in 2004 and then replaced with an AICD/defib at age 63 in 2008. She had a negative Stress Myoview in 2010.  Patient also has CKD3 (GFR 34)  Patient's BP has been controlled at home and patient denies any cardiac symptoms as chest pain, palpitations, shortness of breath, dizziness or ankle swelling. Today's BP is at goal - 124/62.      Patient had recent overnight hospitalization at [facility] for suspected TIA with negative Head CT scan, 2D echo, essentially negative labs and discharged with recommendation for home PT for balance & gait.      Patient's hyperlipidemia is not controlled with diet and medications are deferred for age. Lab Results  Component Value Date   CHOL 201 (H) 12/10/2017   HDL 54 12/10/2017   LDLCALC 115 (H) 12/10/2017   TRIG 201 (H) 12/10/2017   CHOLHDL 3.7 12/10/2017      Patient has prediabetes (A1c 6.0%/2012) and patient denies reactive hypoglycemic symptoms, visual blurring, diabetic polys, or paresthesias. Last A1c was Normal & at goal: Lab Results  Component Value Date   HGBA1C 5.6 09/01/2017      Finally, patient has history of Vitamin D Deficiency ("33"/2008) and last Vitamin D was at goal: Lab Results  Component Value Date   VD25OH 61 09/01/2017   Current Outpatient Medications on File Prior to Visit  Medication Sig  .  aspirin EC 81 MG tablet Take 81 mg by mouth daily.  Marland Kitchen b complex vitamins capsule Take 1 capsule by mouth daily.   . bumetanide (BUMEX) 1 MG tablet Take 1/2 tablet as needed for edema.  . carvedilol (COREG) 6.25 MG tablet Take 6.25 mg by mouth 2 (two) times daily with a meal.  . Cholecalciferol (VITAMIN D) 2000 units CAPS Take 4,000 Units by mouth daily.   . clopidogrel (PLAVIX) 75 MG tablet TAKE 1 TABLET BY MOUTH EVERY DAY TO PREVENT HEART ATTACK  . gabapentin (NEURONTIN) 600 MG tablet TAKE 1 TABLET BY MOUTH AT BEDTIME FOR NEUROPATHY  . losartan (COZAAR) 50 MG tablet Take 25 mg by mouth daily.   . magnesium gluconate (MAGONATE) 500 MG tablet Take 500 mg by mouth daily.   . ranitidine (ZANTAC) 300 MG tablet TAKE 1 TABLET TWICE A DAY FOR REFLUX  . umeclidinium-vilanterol (ANORO ELLIPTA) 62.5-25 MCG/INH AEPB Inhale into the lungs.   No current facility-administered medications on file prior to visit.    Allergies  Allergen Reactions  . Nexium [Esomeprazole Magnesium] Diarrhea   Past Medical History:  Diagnosis Date  . A-fib ([place])   . Biventricular cardiac pacemaker in situ    05/06/13  Old AICD explanted and 6949 lead capped  RV lead  Medtronic 5076 [id number]  LV lead  Medtronic 4194  [id number] V Medtronic biventricular pacer  PVX [id number]   . Diabetes mellitus without complication ([place])   . GERD (gastroesophageal reflux disease)   .  History of duodenal ulcer   . History of esophageal stricture   . Hyperlipidemia   . Hypertension   . Occlusion and stenosis of carotid artery without mention of cerebral infarction   . Peripheral neuropathy   . Right groin pain 12/12/2014  . Secondary cardiomyopathy, unspecified   . Transient global amnesia    Health Maintenance  Topic Date Due  . INFLUENZA VACCINE  07/09/2018  . TETANUS/TDAP  10/02/2025  . DEXA SCAN  Completed  . PNA vac Low Risk Adult  Completed   Immunization History  Administered Date(s) Administered  . DT 10/03/2015  .  Influenza, High Dose Seasonal PF 10/19/2013, 08/06/2016  . Influenza-Unspecified 10/08/2012, 02/03/2015  . Pneumococcal Conjugate-13 08/31/2014  . Pneumococcal-Unspecified 12/09/2002  . Td 12/09/2002  . Zoster 08/06/2016   Last Colon - 06/09/2011  Last MGM - 05/10/2016  Past Surgical History:  Procedure Laterality Date  . ABDOMINAL HYSTERECTOMY    . APPENDECTOMY    . BI-VENTRICULAR PACEMAKER UPGRADE N/A 05/05/2013   Procedure: BI-VENTRICULAR PACEMAKER UPGRADE;  Surgeon: Evans Lance, MD;  Location: [facility] CATH LAB;  Service: Cardiovascular;  Laterality: N/A;  . BIV PACEMAKER GENERATOR CHANGE OUT  05/05/2013   MDT CRTP upgrade (previously implanted dual chamber ICD) by Dr Lovena Le.  New RV/LV leads placed.   Marland Kitchen BREAST SURGERY Left 1985   Negative  Biopsy  . CARDIAC CATHETERIZATION     04/2003  . CARDIAC DEFIBRILLATOR PLACEMENT  09/2004   MDT ICD implanted for primary prevention; device upgrade to CRTP in 04-2013  . CARPAL TUNNEL RELEASE    . CHOLECYSTECTOMY     Family History  Problem Relation Age of Onset  . Dementia Mother   . Diabetes Mother   . Heart Problems Father   . Heart attack Son   . Heart disease Son   . Heart disease Brother   . Heart attack Brother   . Cancer Maternal Grandmother        breast  . Stroke Daughter   . Hypertension Daughter   . Hyperlipidemia Daughter   . Diabetes Daughter   . Diabetes Sister   . Hypertension Sister   . Hyperlipidemia Sister   . Hypertension Sister   . Hyperlipidemia Sister   . Cancer Sister        Breast  . Heart Problems Sister    Social History   Tobacco Use  . Smoking status: Passive Smoke Exposure - Never Smoker  . Smokeless tobacco: Never Used  . Tobacco comment: worked at [employer] Topics  . Alcohol use: No    Alcohol/week: 0.0 oz  . Drug use: No    ROS Constitutional: Denies fever, chills, weight loss/gain, headaches, insomnia,  night sweats, and change in appetite. Does c/o fatigue. Eyes: Denies  redness, blurred vision, diplopia, discharge, itchy, watery eyes.  ENT: Denies discharge, congestion, post nasal drip, epistaxis, sore throat, earache, hearing loss, dental pain, Tinnitus, Vertigo, Sinus pain, snoring.  Cardio: Denies chest pain, palpitations, irregular heartbeat, syncope, dyspnea, diaphoresis, orthopnea, PND, claudication, edema Respiratory: denies cough, dyspnea, DOE, pleurisy, hoarseness, laryngitis, wheezing.  Gastrointestinal: Denies dysphagia, heartburn, reflux, water brash, pain, cramps, nausea, vomiting, bloating, diarrhea, constipation, hematemesis, melena, hematochezia, jaundice, hemorrhoids Genitourinary: Denies dysuria, frequency, urgency, nocturia, hesitancy, discharge, hematuria, flank pain Breast: Breast lumps, nipple discharge, bleeding.  Musculoskeletal: Denies arthralgia, myalgia, stiffness, Jt. Swelling, pain, limp, and strain/sprain. Denies falls. Skin: Denies puritis, rash, hives, warts, acne, eczema, changing in skin lesion Neuro: No weakness, tremor, incoordination, spasms,  paresthesia, pain Psychiatric: Denies confusion, memory loss, sensory loss. Denies Depression. Endocrine: Denies change in weight, skin, hair change, nocturia, and paresthesia, diabetic polys, visual blurring, hyper / hypo glycemic episodes.  Heme/Lymph: No excessive bleeding, bruising, enlarged lymph nodes.  Physical Exam  BP 124/62   Pulse 72   Temp (!) 97.5 F (36.4 C)   Resp 16   Ht 5\' 3"  (1.6 m)   Wt 142 lb 9.6 oz (64.7 kg)   BMI 25.26 kg/m   General Appearance: Well nourished, well groomed and in no apparent distress.  Eyes: PERRLA, EOMs, conjunctiva no swelling or erythema, normal fundi and vessels. Sinuses: No frontal/maxillary tenderness ENT/Mouth: EACs patent / TMs  nl. Nares clear without erythema, swelling, mucoid exudates. Oral hygiene is good. No erythema, swelling, or exudate. Tongue normal, non-obstructing. Tonsils not swollen or erythematous. Hearing normal.   Neck: Supple, thyroid not palpable. No bruits, nodes or JVD. Respiratory: Respiratory effort normal.  BS equal and clear bilateral without rales, rhonci, wheezing or stridor. Cardio: Heart sounds are soft with regular rate and rhythm and no murmurs, rubs or gallops. Peripheral pulses are normal and equal bilaterally with 1(+) ankle edema. edema. No aortic or femoral bruits. Chest: symmetric with normal excursions and percussion. Breasts: Symmetric, without lumps, nipple discharge, retractions, or fibrocystic changes.  Abdomen: Flat, soft with bowel sounds active. Nontender, no guarding, rebound, hernias, masses, or organomegaly.  Lymphatics: Non tender without lymphadenopathy.  Musculoskeletal: Generalized decrease in muscle power, tone and bulk and broad based gait supported with a walker. Skin: Warm and dry without rashes, lesions, cyanosis, clubbing or  ecchymosis.  Neuro: Cranial nerves intact, reflexes equal bilaterally. Normal muscle tone, no cerebellar symptoms. Sensation intact.  Pysch: Alert and oriented X 3, normal affect, Insight and Judgment appropriate.   Assessment and Plan  1. Essential hypertension  - Urinalysis, Routine w reflex microscopic - Microalbumin / creatinine urine ratio - CBC with Differential/Platelet - COMPLETE METABOLIC PANEL WITH GFR - Magnesium - TSH  2. Hyperlipidemia, mixed  - COMPLETE METABOLIC PANEL WITH GFR - Lipid panel - TSH  3. Abnormal glucose  - Hemoglobin A1c - Insulin, random  4. Vitamin D deficiency  - VITAMIN D 25 Hydroxy   5. Prediabetes  - Hemoglobin A1c - Insulin, random  6. ASHD (arteriosclerotic heart disease)  - Lipid panel  7. Biventricular cardiac pacemaker in situ   8. Cystitis  - Urinalysis, Routine w reflex microscopic - Urine Culture - CBC with Differential/Platelet  9. Screening for ischemic heart disease  - Lipid panel  10. FHx: heart disease  11. Medication management  - Urinalysis, Routine  w reflex microscopic - Microalbumin / creatinine urine ratio - CBC with Differential/Platelet - COMPLETE METABOLIC PANEL WITH GFR - Magnesium - Lipid panel - TSH - Hemoglobin A1c - Insulin, random - VITAMIN D 25 Hydroxy   12. TIA (transient ischemic attack)  - Ambulatory referral to [place]. Unstable gait  - Ambulatory referral to [place]           Patient was counseled in prudent diet to achieve/maintain BMI less than 25 for weight control, BP monitoring, regular exercise and medications. Discussed med's effects and SE's. Screening labs and tests as requested with regular follow-up as recommended. Over 40 minutes of exam, counseling, chart review and high complex critical decision making was performed.

## 2018-04-01 NOTE — Patient Instructions (Signed)

## 2018-04-02 ENCOUNTER — Encounter: Payer: Self-pay | Admitting: Internal Medicine

## 2018-04-03 DIAGNOSIS — Z7722 Contact with and (suspected) exposure to environmental tobacco smoke (acute) (chronic): Secondary | ICD-10-CM | POA: Diagnosis not present

## 2018-04-03 DIAGNOSIS — Z7902 Long term (current) use of antithrombotics/antiplatelets: Secondary | ICD-10-CM | POA: Diagnosis not present

## 2018-04-03 DIAGNOSIS — I4891 Unspecified atrial fibrillation: Secondary | ICD-10-CM | POA: Diagnosis not present

## 2018-04-03 DIAGNOSIS — Z7982 Long term (current) use of aspirin: Secondary | ICD-10-CM | POA: Diagnosis not present

## 2018-04-03 DIAGNOSIS — I10 Essential (primary) hypertension: Secondary | ICD-10-CM | POA: Diagnosis not present

## 2018-04-03 DIAGNOSIS — K219 Gastro-esophageal reflux disease without esophagitis: Secondary | ICD-10-CM | POA: Diagnosis not present

## 2018-04-03 DIAGNOSIS — R2689 Other abnormalities of gait and mobility: Secondary | ICD-10-CM | POA: Diagnosis not present

## 2018-04-03 DIAGNOSIS — I2510 Atherosclerotic heart disease of native coronary artery without angina pectoris: Secondary | ICD-10-CM | POA: Diagnosis not present

## 2018-04-03 DIAGNOSIS — E782 Mixed hyperlipidemia: Secondary | ICD-10-CM | POA: Diagnosis not present

## 2018-04-03 DIAGNOSIS — Z950 Presence of cardiac pacemaker: Secondary | ICD-10-CM | POA: Diagnosis not present

## 2018-04-03 DIAGNOSIS — G629 Polyneuropathy, unspecified: Secondary | ICD-10-CM | POA: Diagnosis not present

## 2018-04-03 DIAGNOSIS — I6529 Occlusion and stenosis of unspecified carotid artery: Secondary | ICD-10-CM | POA: Diagnosis not present

## 2018-04-03 DIAGNOSIS — Z8673 Personal history of transient ischemic attack (TIA), and cerebral infarction without residual deficits: Secondary | ICD-10-CM | POA: Diagnosis not present

## 2018-04-03 DIAGNOSIS — R7303 Prediabetes: Secondary | ICD-10-CM | POA: Diagnosis not present

## 2018-04-03 DIAGNOSIS — E559 Vitamin D deficiency, unspecified: Secondary | ICD-10-CM | POA: Diagnosis not present

## 2018-04-03 DIAGNOSIS — I428 Other cardiomyopathies: Secondary | ICD-10-CM | POA: Diagnosis not present

## 2018-04-03 DIAGNOSIS — G454 Transient global amnesia: Secondary | ICD-10-CM | POA: Diagnosis not present

## 2018-04-04 DIAGNOSIS — I2510 Atherosclerotic heart disease of native coronary artery without angina pectoris: Secondary | ICD-10-CM | POA: Diagnosis not present

## 2018-04-04 DIAGNOSIS — G454 Transient global amnesia: Secondary | ICD-10-CM | POA: Diagnosis not present

## 2018-04-04 DIAGNOSIS — R7303 Prediabetes: Secondary | ICD-10-CM | POA: Diagnosis not present

## 2018-04-04 DIAGNOSIS — Z8673 Personal history of transient ischemic attack (TIA), and cerebral infarction without residual deficits: Secondary | ICD-10-CM | POA: Diagnosis not present

## 2018-04-04 DIAGNOSIS — R2689 Other abnormalities of gait and mobility: Secondary | ICD-10-CM | POA: Diagnosis not present

## 2018-04-04 DIAGNOSIS — G629 Polyneuropathy, unspecified: Secondary | ICD-10-CM | POA: Diagnosis not present

## 2018-04-05 ENCOUNTER — Other Ambulatory Visit: Payer: Self-pay | Admitting: Internal Medicine

## 2018-04-06 LAB — MICROALBUMIN / CREATININE URINE RATIO
Creatinine, Urine: 91 mg/dL (ref 20–275)
MICROALB/CREAT RATIO: 7 ug/mg{creat} (ref <30)
Microalb, Ur: 0.6 mg/dL

## 2018-04-06 LAB — COMPLETE METABOLIC PANEL WITH GFR
AG RATIO: 1.8 (calc) (ref 1.0–2.5)
ALBUMIN MSPROF: 3.5 g/dL — AB (ref 3.6–5.1)
ALKALINE PHOSPHATASE (APISO): 49 U/L (ref 33–130)
ALT: 15 U/L (ref 6–29)
AST: 18 U/L (ref 10–35)
BILIRUBIN TOTAL: 0.6 mg/dL (ref 0.2–1.2)
BUN / CREAT RATIO: 13 (calc) (ref 6–22)
BUN: 18 mg/dL (ref 7–25)
CHLORIDE: 104 mmol/L (ref 98–110)
CO2: 28 mmol/L (ref 20–32)
Calcium: 9 mg/dL (ref 8.6–10.4)
Creat: 1.39 mg/dL — ABNORMAL HIGH (ref 0.60–0.88)
GFR, Est African American: 39 mL/min/{1.73_m2} — ABNORMAL LOW (ref >=60)
GFR, Est Non African American: 33 mL/min/{1.73_m2} — ABNORMAL LOW (ref >=60)
GLOBULIN: 1.9 g/dL (ref 1.9–3.7)
Glucose, Bld: 121 mg/dL — ABNORMAL HIGH (ref 65–99)
POTASSIUM: 4.5 mmol/L (ref 3.5–5.3)
SODIUM: 140 mmol/L (ref 135–146)
Total Protein: 5.4 g/dL — ABNORMAL LOW (ref 6.1–8.1)

## 2018-04-06 LAB — URINALYSIS, ROUTINE W REFLEX MICROSCOPIC
BILIRUBIN URINE: NEGATIVE
Glucose, UA: NEGATIVE
Hgb urine dipstick: NEGATIVE
Ketones, ur: NEGATIVE
LEUKOCYTES UA: NEGATIVE
Nitrite: NEGATIVE
PROTEIN: NEGATIVE
SPECIFIC GRAVITY, URINE: 1.014 (ref 1.001–1.03)
pH: 6 (ref 5.0–8.0)

## 2018-04-06 LAB — CBC WITH DIFFERENTIAL/PLATELET
BASOS ABS: 35 {cells}/uL (ref 0–200)
Basophils Relative: 0.3 %
EOS ABS: 295 {cells}/uL (ref 15–500)
EOS PCT: 2.5 %
HEMATOCRIT: 40.9 % (ref 35.0–45.0)
HEMOGLOBIN: 13.8 g/dL (ref 11.7–15.5)
Lymphs Abs: 1652 cells/uL (ref 850–3900)
MCH: 30.1 pg (ref 27.0–33.0)
MCHC: 33.7 g/dL (ref 32.0–36.0)
MCV: 89.3 fL (ref 80.0–100.0)
MPV: 11.8 fL (ref 7.5–12.5)
Monocytes Relative: 7.8 %
NEUTROS ABS: 8897 {cells}/uL — AB (ref 1500–7800)
NEUTROS PCT: 75.4 %
Platelets: 230 10*3/uL (ref 140–400)
RBC: 4.58 10*6/uL (ref 3.80–5.10)
RDW: 12.2 % (ref 11.0–15.0)
Total Lymphocyte: 14 %
WBC mixed population: 920 cells/uL (ref 200–950)
WBC: 11.8 10*3/uL — ABNORMAL HIGH (ref 3.8–10.8)

## 2018-04-06 LAB — URINE CULTURE
MICRO NUMBER: 90501318
RESULT: NO GROWTH
SPECIMEN QUALITY:: ADEQUATE

## 2018-04-06 LAB — LIPID PANEL
CHOLESTEROL: 197 mg/dL (ref <200)
HDL: 53 mg/dL (ref >50)
LDL CHOLESTEROL (CALC): 118 mg/dL — AB
Non-HDL Cholesterol (Calc): 144 mg/dL (calc) — ABNORMAL HIGH (ref <130)
Total CHOL/HDL Ratio: 3.7 (calc) (ref <5.0)
Triglycerides: 147 mg/dL (ref <150)

## 2018-04-06 LAB — HEMOGLOBIN A1C
EAG (MMOL/L): 7.3 (calc)
HEMOGLOBIN A1C: 6.2 %{Hb} — AB (ref <5.7)
MEAN PLASMA GLUCOSE: 131 (calc)

## 2018-04-06 LAB — TSH: TSH: 2.11 m[IU]/L (ref 0.40–4.50)

## 2018-04-06 LAB — INSULIN, RANDOM: INSULIN: 11.9 u[IU]/mL (ref 2.0–19.6)

## 2018-04-06 LAB — MAGNESIUM: MAGNESIUM: 2.2 mg/dL (ref 1.5–2.5)

## 2018-04-06 LAB — VITAMIN D 25 HYDROXY (VIT D DEFICIENCY, FRACTURES): Vit D, 25-Hydroxy: 64 ng/mL (ref 30–100)

## 2018-04-08 DIAGNOSIS — R7303 Prediabetes: Secondary | ICD-10-CM | POA: Diagnosis not present

## 2018-04-08 DIAGNOSIS — Z8673 Personal history of transient ischemic attack (TIA), and cerebral infarction without residual deficits: Secondary | ICD-10-CM | POA: Diagnosis not present

## 2018-04-08 DIAGNOSIS — G629 Polyneuropathy, unspecified: Secondary | ICD-10-CM | POA: Diagnosis not present

## 2018-04-08 DIAGNOSIS — G454 Transient global amnesia: Secondary | ICD-10-CM | POA: Diagnosis not present

## 2018-04-08 DIAGNOSIS — R2689 Other abnormalities of gait and mobility: Secondary | ICD-10-CM | POA: Diagnosis not present

## 2018-04-08 DIAGNOSIS — I2510 Atherosclerotic heart disease of native coronary artery without angina pectoris: Secondary | ICD-10-CM | POA: Diagnosis not present

## 2018-04-09 ENCOUNTER — Other Ambulatory Visit: Payer: Self-pay | Admitting: Internal Medicine

## 2018-04-10 DIAGNOSIS — G629 Polyneuropathy, unspecified: Secondary | ICD-10-CM | POA: Diagnosis not present

## 2018-04-10 DIAGNOSIS — I2510 Atherosclerotic heart disease of native coronary artery without angina pectoris: Secondary | ICD-10-CM | POA: Diagnosis not present

## 2018-04-10 DIAGNOSIS — Z8673 Personal history of transient ischemic attack (TIA), and cerebral infarction without residual deficits: Secondary | ICD-10-CM | POA: Diagnosis not present

## 2018-04-10 DIAGNOSIS — R2689 Other abnormalities of gait and mobility: Secondary | ICD-10-CM | POA: Diagnosis not present

## 2018-04-10 DIAGNOSIS — R7303 Prediabetes: Secondary | ICD-10-CM | POA: Diagnosis not present

## 2018-04-10 DIAGNOSIS — G454 Transient global amnesia: Secondary | ICD-10-CM | POA: Diagnosis not present

## 2018-04-14 DIAGNOSIS — R7303 Prediabetes: Secondary | ICD-10-CM | POA: Diagnosis not present

## 2018-04-14 DIAGNOSIS — Z8673 Personal history of transient ischemic attack (TIA), and cerebral infarction without residual deficits: Secondary | ICD-10-CM | POA: Diagnosis not present

## 2018-04-14 DIAGNOSIS — R2689 Other abnormalities of gait and mobility: Secondary | ICD-10-CM | POA: Diagnosis not present

## 2018-04-14 DIAGNOSIS — G629 Polyneuropathy, unspecified: Secondary | ICD-10-CM | POA: Diagnosis not present

## 2018-04-14 DIAGNOSIS — I2510 Atherosclerotic heart disease of native coronary artery without angina pectoris: Secondary | ICD-10-CM | POA: Diagnosis not present

## 2018-04-14 DIAGNOSIS — G454 Transient global amnesia: Secondary | ICD-10-CM | POA: Diagnosis not present

## 2018-04-15 ENCOUNTER — Other Ambulatory Visit (INDEPENDENT_AMBULATORY_CARE_PROVIDER_SITE_OTHER): Payer: Medicare Other

## 2018-04-15 DIAGNOSIS — R7303 Prediabetes: Secondary | ICD-10-CM | POA: Diagnosis not present

## 2018-04-15 DIAGNOSIS — Z1212 Encounter for screening for malignant neoplasm of rectum: Principal | ICD-10-CM

## 2018-04-15 DIAGNOSIS — G454 Transient global amnesia: Secondary | ICD-10-CM | POA: Diagnosis not present

## 2018-04-15 DIAGNOSIS — G629 Polyneuropathy, unspecified: Secondary | ICD-10-CM | POA: Diagnosis not present

## 2018-04-15 DIAGNOSIS — I2510 Atherosclerotic heart disease of native coronary artery without angina pectoris: Secondary | ICD-10-CM | POA: Diagnosis not present

## 2018-04-15 DIAGNOSIS — Z8673 Personal history of transient ischemic attack (TIA), and cerebral infarction without residual deficits: Secondary | ICD-10-CM | POA: Diagnosis not present

## 2018-04-15 DIAGNOSIS — Z1211 Encounter for screening for malignant neoplasm of colon: Secondary | ICD-10-CM | POA: Diagnosis not present

## 2018-04-15 DIAGNOSIS — R2689 Other abnormalities of gait and mobility: Secondary | ICD-10-CM | POA: Diagnosis not present

## 2018-04-15 LAB — POC HEMOCCULT BLD/STL (HOME/3-CARD/SCREEN)
Card #2 Fecal Occult Blod, POC: NEGATIVE
Card #3 Fecal Occult Blood, POC: NEGATIVE
Fecal Occult Blood, POC: NEGATIVE

## 2018-04-16 DIAGNOSIS — I2510 Atherosclerotic heart disease of native coronary artery without angina pectoris: Secondary | ICD-10-CM | POA: Diagnosis not present

## 2018-04-16 DIAGNOSIS — R7303 Prediabetes: Secondary | ICD-10-CM | POA: Diagnosis not present

## 2018-04-16 DIAGNOSIS — R2689 Other abnormalities of gait and mobility: Secondary | ICD-10-CM | POA: Diagnosis not present

## 2018-04-16 DIAGNOSIS — G454 Transient global amnesia: Secondary | ICD-10-CM | POA: Diagnosis not present

## 2018-04-16 DIAGNOSIS — G629 Polyneuropathy, unspecified: Secondary | ICD-10-CM | POA: Diagnosis not present

## 2018-04-16 DIAGNOSIS — Z8673 Personal history of transient ischemic attack (TIA), and cerebral infarction without residual deficits: Secondary | ICD-10-CM | POA: Diagnosis not present

## 2018-04-22 DIAGNOSIS — R2689 Other abnormalities of gait and mobility: Secondary | ICD-10-CM | POA: Diagnosis not present

## 2018-04-22 DIAGNOSIS — Z8673 Personal history of transient ischemic attack (TIA), and cerebral infarction without residual deficits: Secondary | ICD-10-CM | POA: Diagnosis not present

## 2018-04-22 DIAGNOSIS — R7303 Prediabetes: Secondary | ICD-10-CM | POA: Diagnosis not present

## 2018-04-22 DIAGNOSIS — G629 Polyneuropathy, unspecified: Secondary | ICD-10-CM | POA: Diagnosis not present

## 2018-04-22 DIAGNOSIS — G454 Transient global amnesia: Secondary | ICD-10-CM | POA: Diagnosis not present

## 2018-04-22 DIAGNOSIS — I2510 Atherosclerotic heart disease of native coronary artery without angina pectoris: Secondary | ICD-10-CM | POA: Diagnosis not present

## 2018-04-24 DIAGNOSIS — R7303 Prediabetes: Secondary | ICD-10-CM | POA: Diagnosis not present

## 2018-04-24 DIAGNOSIS — G629 Polyneuropathy, unspecified: Secondary | ICD-10-CM | POA: Diagnosis not present

## 2018-04-24 DIAGNOSIS — R2689 Other abnormalities of gait and mobility: Secondary | ICD-10-CM | POA: Diagnosis not present

## 2018-04-24 DIAGNOSIS — I2510 Atherosclerotic heart disease of native coronary artery without angina pectoris: Secondary | ICD-10-CM | POA: Diagnosis not present

## 2018-04-24 DIAGNOSIS — Z8673 Personal history of transient ischemic attack (TIA), and cerebral infarction without residual deficits: Secondary | ICD-10-CM | POA: Diagnosis not present

## 2018-04-24 DIAGNOSIS — G454 Transient global amnesia: Secondary | ICD-10-CM | POA: Diagnosis not present

## 2018-04-29 ENCOUNTER — Ambulatory Visit (INDEPENDENT_AMBULATORY_CARE_PROVIDER_SITE_OTHER): Payer: Medicare Other | Admitting: *Deleted

## 2018-04-29 DIAGNOSIS — I2510 Atherosclerotic heart disease of native coronary artery without angina pectoris: Secondary | ICD-10-CM | POA: Diagnosis not present

## 2018-04-29 DIAGNOSIS — G454 Transient global amnesia: Secondary | ICD-10-CM | POA: Diagnosis not present

## 2018-04-29 DIAGNOSIS — Z8673 Personal history of transient ischemic attack (TIA), and cerebral infarction without residual deficits: Secondary | ICD-10-CM | POA: Diagnosis not present

## 2018-04-29 DIAGNOSIS — R7303 Prediabetes: Secondary | ICD-10-CM | POA: Diagnosis not present

## 2018-04-29 DIAGNOSIS — I420 Dilated cardiomyopathy: Secondary | ICD-10-CM

## 2018-04-29 DIAGNOSIS — R55 Syncope and collapse: Secondary | ICD-10-CM

## 2018-04-29 DIAGNOSIS — G629 Polyneuropathy, unspecified: Secondary | ICD-10-CM | POA: Diagnosis not present

## 2018-04-29 DIAGNOSIS — R2689 Other abnormalities of gait and mobility: Secondary | ICD-10-CM | POA: Diagnosis not present

## 2018-04-29 NOTE — Progress Notes (Signed)
Remote pacemaker transmission.   

## 2018-05-05 ENCOUNTER — Ambulatory Visit (INDEPENDENT_AMBULATORY_CARE_PROVIDER_SITE_OTHER): Payer: Medicare Other | Admitting: Podiatry

## 2018-05-05 DIAGNOSIS — M79676 Pain in unspecified toe(s): Secondary | ICD-10-CM

## 2018-05-05 DIAGNOSIS — B351 Tinea unguium: Secondary | ICD-10-CM

## 2018-05-05 NOTE — Progress Notes (Signed)
Subjective: 84  y.o. returns the office today for painful, elongated, thickened toenails which she cannot trim herself. Denies any redness or drainage around the nails. Denies any acute changes since last appointment and no new complaints today. Denies any systemic complaints such as fevers, chills, nausea, vomiting.   Objective: AAO 3, NAD DP/PT pulses palpable, CRT less than 3 seconds Nails hypertrophic, dystrophic, elongated, brittle, discolored 10. There is tenderness overlying the nails 1-5 bilaterally with the right hallux the worst. There is no surrounding erythema or drainage along the nail sites. No open lesions or pre-ulcerative lesions are identified. No other areas of tenderness bilateral lower extremities. No overlying edema, erythema, increased warmth. No pain with calf compression, swelling, warmth, erythema.  Assessment: Patient presents with symptomatic onychomycosis  Plan: -Treatment options including alternatives, risks, complications were discussed -Nails sharply debrided 10 without complication/bleeding.  -Discussed daily foot inspection. If there are any changes, to call the office immediately.  -Follow-up in 3 months or sooner if any problems are to arise. In the meantime, encouraged to call the office with any questions, concerns, changes symptoms.  Matthew Wagoner, DPM  

## 2018-05-08 DIAGNOSIS — G629 Polyneuropathy, unspecified: Secondary | ICD-10-CM | POA: Diagnosis not present

## 2018-05-08 DIAGNOSIS — R7303 Prediabetes: Secondary | ICD-10-CM | POA: Diagnosis not present

## 2018-05-08 DIAGNOSIS — R2689 Other abnormalities of gait and mobility: Secondary | ICD-10-CM | POA: Diagnosis not present

## 2018-05-08 DIAGNOSIS — G454 Transient global amnesia: Secondary | ICD-10-CM | POA: Diagnosis not present

## 2018-05-08 DIAGNOSIS — I2510 Atherosclerotic heart disease of native coronary artery without angina pectoris: Secondary | ICD-10-CM | POA: Diagnosis not present

## 2018-05-08 DIAGNOSIS — Z8673 Personal history of transient ischemic attack (TIA), and cerebral infarction without residual deficits: Secondary | ICD-10-CM | POA: Diagnosis not present

## 2018-05-13 ENCOUNTER — Telehealth: Payer: Self-pay | Admitting: *Deleted

## 2018-05-13 DIAGNOSIS — G629 Polyneuropathy, unspecified: Secondary | ICD-10-CM | POA: Diagnosis not present

## 2018-05-13 DIAGNOSIS — R2689 Other abnormalities of gait and mobility: Secondary | ICD-10-CM | POA: Diagnosis not present

## 2018-05-13 DIAGNOSIS — I2510 Atherosclerotic heart disease of native coronary artery without angina pectoris: Secondary | ICD-10-CM | POA: Diagnosis not present

## 2018-05-13 DIAGNOSIS — Z8673 Personal history of transient ischemic attack (TIA), and cerebral infarction without residual deficits: Secondary | ICD-10-CM | POA: Diagnosis not present

## 2018-05-13 DIAGNOSIS — R7303 Prediabetes: Secondary | ICD-10-CM | POA: Diagnosis not present

## 2018-05-13 DIAGNOSIS — G454 Transient global amnesia: Secondary | ICD-10-CM | POA: Diagnosis not present

## 2018-05-13 NOTE — Telephone Encounter (Signed)
Spoke with patient and her sitter regarding her BP.  The patient's BP prior to taking her BP medication was 174/78.  The sitter was advised to recheck the patient's BP about 2 hours after she takes her BP medication. AT that time, her BP was 150/73.  Per Dr Melford Aase, she should increase her Losartan 50 mg 1/2 tablet daily to 1/2 tablet twice a day.  The sitter and the daughter is aware

## 2018-05-18 DIAGNOSIS — Z01419 Encounter for gynecological examination (general) (routine) without abnormal findings: Secondary | ICD-10-CM | POA: Diagnosis not present

## 2018-05-18 DIAGNOSIS — Z6826 Body mass index (BMI) 26.0-26.9, adult: Secondary | ICD-10-CM | POA: Diagnosis not present

## 2018-05-20 DIAGNOSIS — G454 Transient global amnesia: Secondary | ICD-10-CM | POA: Diagnosis not present

## 2018-05-20 DIAGNOSIS — G629 Polyneuropathy, unspecified: Secondary | ICD-10-CM | POA: Diagnosis not present

## 2018-05-20 DIAGNOSIS — R7303 Prediabetes: Secondary | ICD-10-CM | POA: Diagnosis not present

## 2018-05-20 DIAGNOSIS — I2510 Atherosclerotic heart disease of native coronary artery without angina pectoris: Secondary | ICD-10-CM | POA: Diagnosis not present

## 2018-05-20 DIAGNOSIS — Z8673 Personal history of transient ischemic attack (TIA), and cerebral infarction without residual deficits: Secondary | ICD-10-CM | POA: Diagnosis not present

## 2018-05-20 DIAGNOSIS — R2689 Other abnormalities of gait and mobility: Secondary | ICD-10-CM | POA: Diagnosis not present

## 2018-05-26 ENCOUNTER — Telehealth: Payer: Self-pay

## 2018-05-26 LAB — CUP PACEART REMOTE DEVICE CHECK
Battery Remaining Longevity: 53 mo
Battery Voltage: 2.99 V
Brady Statistic AS VP Percent: 83.45 %
Brady Statistic AS VS Percent: 1.13 %
Brady Statistic RA Percent Paced: 15.36 %
Implantable Lead Implant Date: 20051007
Implantable Lead Implant Date: 20140528
Implantable Lead Implant Date: 20140528
Implantable Lead Location: 753858
Implantable Lead Location: 753859
Implantable Lead Model: 4194
Implantable Lead Model: 5076
Implantable Pulse Generator Implant Date: 20140528
Lead Channel Impedance Value: 399 Ohm
Lead Channel Impedance Value: 399 Ohm
Lead Channel Impedance Value: 456 Ohm
Lead Channel Impedance Value: 494 Ohm
Lead Channel Impedance Value: 589 Ohm
Lead Channel Pacing Threshold Amplitude: 0.5 V
Lead Channel Pacing Threshold Amplitude: 1.375 V
Lead Channel Pacing Threshold Pulse Width: 0.4 ms
Lead Channel Pacing Threshold Pulse Width: 0.4 ms
Lead Channel Pacing Threshold Pulse Width: 0.4 ms
Lead Channel Sensing Intrinsic Amplitude: 1.375 mV
Lead Channel Sensing Intrinsic Amplitude: 9 mV
Lead Channel Sensing Intrinsic Amplitude: 9 mV
Lead Channel Setting Pacing Amplitude: 2.5 V
Lead Channel Setting Pacing Pulse Width: 0.4 ms
Lead Channel Setting Pacing Pulse Width: 0.4 ms
MDC IDC LEAD LOCATION: 753860
MDC IDC MSMT LEADCHNL LV IMPEDANCE VALUE: 304 Ohm
MDC IDC MSMT LEADCHNL LV IMPEDANCE VALUE: 475 Ohm
MDC IDC MSMT LEADCHNL LV IMPEDANCE VALUE: 551 Ohm
MDC IDC MSMT LEADCHNL RA PACING THRESHOLD AMPLITUDE: 0.375 V
MDC IDC MSMT LEADCHNL RA SENSING INTR AMPL: 1.375 mV
MDC IDC MSMT LEADCHNL RV IMPEDANCE VALUE: 475 Ohm
MDC IDC SESS DTM: 20190522133850
MDC IDC SET LEADCHNL RA PACING AMPLITUDE: 2 V
MDC IDC SET LEADCHNL RV PACING AMPLITUDE: 2 V
MDC IDC SET LEADCHNL RV SENSING SENSITIVITY: 0.9 mV
MDC IDC STAT BRADY AP VP PERCENT: 15.39 %
MDC IDC STAT BRADY AP VS PERCENT: 0.03 %
MDC IDC STAT BRADY RV PERCENT PACED: 98.46 %

## 2018-05-26 NOTE — Telephone Encounter (Signed)
Spoke with patient who denied any new ShOB or swelling of the feet or ankles. She states that she "feels good". I noted an increase in her fluid levels. She stated that she felt good and didn't notice any symptoms that her fluid levels were elevated. I verbalized understanding. Will continue to monitor.

## 2018-05-26 NOTE — Telephone Encounter (Signed)
LVM for call back to discuss increase in optivol trends

## 2018-05-28 DIAGNOSIS — G454 Transient global amnesia: Secondary | ICD-10-CM | POA: Diagnosis not present

## 2018-05-28 DIAGNOSIS — I2510 Atherosclerotic heart disease of native coronary artery without angina pectoris: Secondary | ICD-10-CM | POA: Diagnosis not present

## 2018-05-28 DIAGNOSIS — R7303 Prediabetes: Secondary | ICD-10-CM | POA: Diagnosis not present

## 2018-05-28 DIAGNOSIS — R2689 Other abnormalities of gait and mobility: Secondary | ICD-10-CM | POA: Diagnosis not present

## 2018-05-28 DIAGNOSIS — G629 Polyneuropathy, unspecified: Secondary | ICD-10-CM | POA: Diagnosis not present

## 2018-05-28 DIAGNOSIS — Z8673 Personal history of transient ischemic attack (TIA), and cerebral infarction without residual deficits: Secondary | ICD-10-CM | POA: Diagnosis not present

## 2018-05-29 ENCOUNTER — Telehealth: Payer: Self-pay | Admitting: *Deleted

## 2018-05-29 NOTE — Telephone Encounter (Signed)
Patient's caregiver called with a BP reading of 172/80. There have been 3 attempts to contact the patient regarding her BP, but there is no answer and message have been left asking the patient to return calls.

## 2018-06-01 ENCOUNTER — Telehealth: Payer: Self-pay | Admitting: *Deleted

## 2018-06-01 ENCOUNTER — Other Ambulatory Visit: Payer: Self-pay | Admitting: Internal Medicine

## 2018-06-01 ENCOUNTER — Other Ambulatory Visit: Payer: Self-pay | Admitting: *Deleted

## 2018-06-01 MED ORDER — GABAPENTIN 600 MG PO TABS: ORAL_TABLET | ORAL | 1 refills | Status: AC

## 2018-06-01 MED ORDER — RANITIDINE HCL 300 MG PO TABS: ORAL_TABLET | ORAL | 1 refills | Status: DC

## 2018-06-01 MED ORDER — CLOPIDOGREL BISULFATE 75 MG PO TABS: ORAL_TABLET | ORAL | 6 refills | Status: DC

## 2018-06-01 NOTE — Telephone Encounter (Signed)
The patient's daughter, Izora Gala called and reported the patient's BP, over the last 3 days, ranging from 134/67 to as high as 169/77.  Per Dr Melford Aase, continue on the same medications, but add back Bumex 1 mg 1/2 tablet daily. Daughter is aware.

## 2018-06-17 DIAGNOSIS — R0609 Other forms of dyspnea: Secondary | ICD-10-CM | POA: Diagnosis not present

## 2018-06-17 DIAGNOSIS — E785 Hyperlipidemia, unspecified: Secondary | ICD-10-CM | POA: Diagnosis not present

## 2018-06-17 DIAGNOSIS — Z950 Presence of cardiac pacemaker: Secondary | ICD-10-CM | POA: Diagnosis not present

## 2018-06-17 DIAGNOSIS — Z8673 Personal history of transient ischemic attack (TIA), and cerebral infarction without residual deficits: Secondary | ICD-10-CM | POA: Diagnosis not present

## 2018-06-17 DIAGNOSIS — R42 Dizziness and giddiness: Secondary | ICD-10-CM | POA: Diagnosis not present

## 2018-06-17 DIAGNOSIS — I119 Hypertensive heart disease without heart failure: Secondary | ICD-10-CM | POA: Diagnosis not present

## 2018-06-17 DIAGNOSIS — K219 Gastro-esophageal reflux disease without esophagitis: Secondary | ICD-10-CM | POA: Diagnosis not present

## 2018-06-17 DIAGNOSIS — R55 Syncope and collapse: Secondary | ICD-10-CM | POA: Diagnosis not present

## 2018-06-17 DIAGNOSIS — I951 Orthostatic hypotension: Secondary | ICD-10-CM | POA: Diagnosis not present

## 2018-06-17 DIAGNOSIS — I6529 Occlusion and stenosis of unspecified carotid artery: Secondary | ICD-10-CM | POA: Diagnosis not present

## 2018-06-19 ENCOUNTER — Telehealth: Payer: Self-pay | Admitting: *Deleted

## 2018-06-19 NOTE — Telephone Encounter (Signed)
Patent called aand reported her blood sugar was 200 this AM.  After speaking with her daughter, it iwas learned that the patient had eaten a very sweet breakfast and checked her blood sugar immediately following.  Per Dr Melford Aase, the patient needs to check fasting blood sugar and call with the results next week.

## 2018-07-07 ENCOUNTER — Encounter

## 2018-07-07 ENCOUNTER — Ambulatory Visit: Payer: Medicare Other | Admitting: Neurology

## 2018-07-08 ENCOUNTER — Other Ambulatory Visit: Payer: Self-pay | Admitting: *Deleted

## 2018-07-08 MED ORDER — LOSARTAN POTASSIUM 50 MG PO TABS: ORAL_TABLET | ORAL | 0 refills | Status: DC

## 2018-07-08 MED ORDER — BUMETANIDE 1 MG PO TABS: ORAL_TABLET | ORAL | 1 refills | Status: DC

## 2018-07-23 NOTE — Progress Notes (Deleted)
FOLLOW UP  Assessment and Plan:   CHF Weights stable; no concerning symptoms; monitor Followed by cardiology  Hypertension Well controlled with current medications  Monitor blood pressure at home; patient to call if consistently greater than 130/80 Continue DASH diet.   Reminder to go to the ER if any CP, SOB, nausea, dizziness, severe HA, changes vision/speech, left arm numbness and tingling and jaw pain.  Cholesterol Currently mildly above goal; will not treat aggressively secondary to age Continue low cholesterol diet and exercise.  Defer lipid panel.   Prediabetes Continue diet and exercise.  Perform daily foot/skin check, notify office of any concerning changes.  Check A1C  BMI 25 Continue to recommend diet heavy in fruits and veggies and low in animal meats, cheeses, and dairy products, appropriate calorie intake Discuss exercise recommendations routinely Continue to monitor weight at each visit  Vitamin D Def At goal at last visit; continue supplementation Defer Vit D level  Continue diet and meds as discussed. Further disposition pending results of labs. Discussed med's effects and SE's.   Over 30 minutes of exam, counseling, chart review, and critical decision making was performed.   Future Appointments  Date Time Provider [place]  07/24/2018 11:30 AM Liane Comber, NP [facility] None  07/29/2018 10:45 AM [facility] DEVICE REMOTES [facility]  08/04/2018  9:15 AM Trula Slade, DPM [facility]  10/30/2018 11:30 AM Unk Pinto, MD [facility] None  04/27/2019  9:00 AM Unk Pinto, MD [facility] None    ----------------------------------------------------------------------------------------------------------------------  HPI 84 y.o. female  presents for 3 month follow up on hypertension, cholesterol, prediabetes, weight and vitamin D deficiency. Patient had a PPM planted in 2004 and then replaced with an AICD/defib  at age 61 in 2008. She had a negative Stress Myoview in 2010. EF 25% in 2014. Followed by Dr. Lovena Le.   Syncopal episodes?   BMI is There is no height or weight on file to calculate BMI., she {HAS HAS YBO:17510} been working on diet and exercise.  She has a history of Systolic CHF, denies {CHENIDPOEUM:35361::"WERXVQM on exertion","orthopnea","paroxysmal nocturnal dyspnea","edema"}. Positive for {CARDIAC SYMPTOMS:12860}. Wt Readings from Last 3 Encounters:  04/01/18 142 lb 9.6 oz (64.7 kg)  03/16/18 145 lb (65.8 kg)  03/11/18 145 lb (65.8 kg)   Her blood pressure {HAS HAS NOT:18834} been controlled at home, today their BP is    She {DOES_DOES GQQ:76195} workout. She denies chest pain, shortness of breath, dizziness.   She is not on cholesterol medication secondary to age. Her cholesterol is not at goal. The cholesterol last visit was:   Lab Results  Component Value Date   CHOL 197 04/01/2018   HDL 53 04/01/2018   LDLCALC 118 (H) 04/01/2018   TRIG 147 04/01/2018   CHOLHDL 3.7 04/01/2018    She {Has/has not:18111} been working on diet and exercise for prediabetes, and denies {Symptoms; diabetes w/o none:19199}. Last A1C in the office was:  Lab Results  Component Value Date   HGBA1C 6.2 (H) 04/01/2018   Patient is on Vitamin D supplement.   Lab Results  Component Value Date   VD25OH 64 04/01/2018        Current Medications:  Current Outpatient Medications on File Prior to Visit  Medication Sig  . aspirin EC 81 MG tablet Take 81 mg by mouth daily.  Marland Kitchen b complex vitamins capsule Take 1 capsule by mouth daily.   . bumetanide (BUMEX) 1 MG tablet TAKE 1/2 TABLET BY MOUTH AS NEEDED FOR SWELLING  . carvedilol (  COREG) 6.25 MG tablet Take 6.25 mg by mouth 2 (two) times daily with a meal.  . Cholecalciferol (VITAMIN D) 2000 units CAPS Take 4,000 Units by mouth daily.   . clopidogrel (PLAVIX) 75 MG tablet TAKE 1 TABLET BY MOUTH EVERY DAY TO PREVENT HEART ATTACK  . gabapentin (NEURONTIN)  600 MG tablet TAKE 1 TABLET BY MOUTH EVERY EVENING AT BEDTIME  . losartan (COZAAR) 50 MG tablet Take 1/2 tablet twice daily for blood pressure.  . magnesium gluconate (MAGONATE) 500 MG tablet Take 500 mg by mouth daily.   . ranitidine (ZANTAC) 300 MG tablet TAKE 1 TABLET TWICE A DAY FOR REFLUX  . umeclidinium-vilanterol (ANORO ELLIPTA) 62.5-25 MCG/INH AEPB Inhale into the lungs.   No current facility-administered medications on file prior to visit.      Allergies:  Allergies  Allergen Reactions  . Nexium [Esomeprazole Magnesium] Diarrhea     Medical History:  Past Medical History:  Diagnosis Date  . A-fib ([place])   . Biventricular cardiac pacemaker in situ    05/06/13  Old AICD explanted and 6949 lead capped  RV lead  Medtronic 5076 [id number]  LV lead  Medtronic 4194  [id number] V Medtronic biventricular pacer  PVX [id number]   . Diabetes mellitus without complication ([place])   . GERD (gastroesophageal reflux disease)   . History of duodenal ulcer   . History of esophageal stricture   . Hyperlipidemia   . Hypertension   . Occlusion and stenosis of carotid artery without mention of cerebral infarction   . Peripheral neuropathy   . Right groin pain 12/12/2014  . Secondary cardiomyopathy, unspecified   . Transient global amnesia    Family history- Reviewed and unchanged Social history- Reviewed and unchanged   Review of Systems:  Review of Systems  Constitutional: Negative for malaise/fatigue and weight loss.  HENT: Negative for hearing loss and tinnitus.   Eyes: Negative for blurred vision and double vision.  Respiratory: Negative for cough, shortness of breath and wheezing.   Cardiovascular: Negative for chest pain, palpitations, orthopnea, claudication and leg swelling.  Gastrointestinal: Negative for abdominal pain, blood in stool, constipation, diarrhea, heartburn, melena, nausea and vomiting.  Genitourinary: Negative.   Musculoskeletal: Negative for joint pain and myalgias.   Skin: Negative for rash.  Neurological: Negative for dizziness, tingling, sensory change, weakness and headaches.  Endo/Heme/Allergies: Negative for polydipsia.  Psychiatric/Behavioral: Negative.   All other systems reviewed and are negative.     Physical Exam: There were no vitals taken for this visit. Wt Readings from Last 3 Encounters:  04/01/18 142 lb 9.6 oz (64.7 kg)  03/16/18 145 lb (65.8 kg)  03/11/18 145 lb (65.8 kg)   General Appearance: Well nourished, in no apparent distress. Eyes: PERRLA, EOMs, conjunctiva no swelling or erythema Sinuses: No Frontal/maxillary tenderness ENT/Mouth: Ext aud canals clear, TMs without erythema, bulging. No erythema, swelling, or exudate on post pharynx.  Tonsils not swollen or erythematous. Hearing normal.  Neck: Supple, thyroid normal.  Respiratory: Respiratory effort normal, BS equal bilaterally without rales, rhonchi, wheezing or stridor.  Cardio: RRR with no MRGs. Brisk peripheral pulses without edema.  Abdomen: Soft, + BS.  Non tender, no guarding, rebound, hernias, masses. Lymphatics: Non tender without lymphadenopathy.  Musculoskeletal: Full ROM, 5/5 strength, {PSY - GAIT AND STATION:22860} gait Skin: Warm, dry without rashes, lesions, ecchymosis.  Neuro: Cranial nerves intact. No cerebellar symptoms.  Psych: Awake and oriented X 3, normal affect, Insight and Judgment appropriate.    Izora Ribas, NP 1:08 PM  [facility]

## 2018-07-24 ENCOUNTER — Ambulatory Visit (INDEPENDENT_AMBULATORY_CARE_PROVIDER_SITE_OTHER): Payer: Medicare Other | Admitting: Adult Health

## 2018-07-24 ENCOUNTER — Encounter: Payer: Self-pay | Admitting: Adult Health

## 2018-07-24 ENCOUNTER — Ambulatory Visit: Payer: Self-pay | Admitting: Adult Health

## 2018-07-24 VITALS — BP 126/52 | HR 62 | Temp 97.9°F | Ht 63.0 in | Wt 148.0 lb

## 2018-07-24 DIAGNOSIS — I10 Essential (primary) hypertension: Secondary | ICD-10-CM

## 2018-07-24 DIAGNOSIS — K219 Gastro-esophageal reflux disease without esophagitis: Secondary | ICD-10-CM

## 2018-07-24 DIAGNOSIS — E782 Mixed hyperlipidemia: Secondary | ICD-10-CM

## 2018-07-24 DIAGNOSIS — Z79899 Other long term (current) drug therapy: Secondary | ICD-10-CM | POA: Diagnosis not present

## 2018-07-24 DIAGNOSIS — I2510 Atherosclerotic heart disease of native coronary artery without angina pectoris: Secondary | ICD-10-CM | POA: Diagnosis not present

## 2018-07-24 DIAGNOSIS — N183 Chronic kidney disease, stage 3 unspecified: Secondary | ICD-10-CM

## 2018-07-24 DIAGNOSIS — H6123 Impacted cerumen, bilateral: Secondary | ICD-10-CM

## 2018-07-24 DIAGNOSIS — E559 Vitamin D deficiency, unspecified: Secondary | ICD-10-CM

## 2018-07-24 DIAGNOSIS — I5022 Chronic systolic (congestive) heart failure: Secondary | ICD-10-CM

## 2018-07-24 DIAGNOSIS — R7303 Prediabetes: Secondary | ICD-10-CM

## 2018-07-24 MED ORDER — LOSARTAN POTASSIUM 50 MG PO TABS: ORAL_TABLET | ORAL | 0 refills | Status: DC

## 2018-07-24 NOTE — Progress Notes (Signed)
FOLLOW UP  Assessment and Plan:   CHF Weights stable; no concerning symptoms; monitor Followed by cardiology  Hypertension AM values at goal, somewhat hypotensive in office today with recent weakness/syncopal episodes; will have her check BPs three times daily, cut back lisinopril to once daily Monitor blood pressure at home; patient to call if consistently greater than 150/90 Continue DASH diet.   Reminder to go to the ER if any CP, SOB, nausea, dizziness, severe HA, changes vision/speech, left arm numbness and tingling and jaw pain.  Cholesterol Currently mildly above goal; will not treat aggressively secondary to age Continue low cholesterol diet and exercise.  Defer lipid panel.   Prediabetes Continue diet and exercise.  Perform daily foot/skin check, notify office of any concerning changes.  Check A1C  BMI 25 Continue to recommend diet heavy in fruits and veggies and low in animal meats, cheeses, and dairy products, appropriate calorie intake Discuss exercise recommendations routinely Continue to monitor weight at each visit  Vitamin D Def At goal at last visit; continue supplementation Defer Vit D level  Bilateral cerumen impaction - stop using Qtips, irrigation used in the office without complications, use OTC drops/oil at home to prevent reoccurence   Continue diet and meds as discussed. Further disposition pending results of labs. Discussed med's effects and SE's.   Over 30 minutes of exam, counseling, chart review, and critical decision making was performed.   Future Appointments  Date Time Provider [place]  07/29/2018 10:45 AM [facility]  08/04/2018  9:15 AM Trula Slade, DPM [facility]  10/30/2018 11:30 AM Unk Pinto, MD [facility] None  04/27/2019  9:00 AM Unk Pinto, MD [facility] None     ----------------------------------------------------------------------------------------------------------------------  HPI 84 y.o. female  presents for 3 month follow up on hypertension, cholesterol, prediabetes, weight and vitamin D deficiency. Patient had a PPM planted in 2004 and then replaced with an AICD/defib at age 58 in 2008. She had a negative Stress Myoview in 2010. EF 25% in 2014. Followed by Dr. Lovena Le.   BMI is Body mass index is 26.22 kg/m., she has not been working on diet and exercise.  She has a history of Systolic CHF, denies orthopnea, paroxysmal nocturnal dyspnea and edema. Positive for palpitations and exertional dyspnea. Wt Readings from Last 3 Encounters:  07/24/18 148 lb (67.1 kg)  04/01/18 142 lb 9.6 oz (64.7 kg)  03/16/18 145 lb (65.8 kg)   Her blood pressure has not been controlled at home (log demonstrates 130-140s/70-80s), today their BP is BP: (!) 126/52  She does not workout. She denies chest pain, shortness of breath, dizziness. She has had some weakness and episodes falling to her knees this summer.    She is not on cholesterol medication secondary to age. Her cholesterol is not at goal. The cholesterol last visit was:   Lab Results  Component Value Date   CHOL 197 04/01/2018   HDL 53 04/01/2018   LDLCALC 118 (H) 04/01/2018   TRIG 147 04/01/2018   CHOLHDL 3.7 04/01/2018    She has been working on diet and exercise for prediabetes, and denies foot ulcerations, increased appetite, nausea, paresthesia of the feet, polydipsia, polyuria, visual disturbances, vomiting and weight loss. Last A1C in the office was:  Lab Results  Component Value Date   HGBA1C 6.2 (H) 04/01/2018   Patient is on Vitamin D supplement.   Lab Results  Component Value Date   VD25OH 64 04/01/2018        Current  Medications:  Current Outpatient Medications on File Prior to Visit  Medication Sig  . aspirin EC 81 MG tablet Take 81 mg by mouth daily.  Marland Kitchen b complex vitamins  capsule Take 1 capsule by mouth daily.   . bumetanide (BUMEX) 1 MG tablet TAKE 1/2 TABLET BY MOUTH AS NEEDED FOR SWELLING  . carvedilol (COREG) 6.25 MG tablet Take 6.25 mg by mouth 2 (two) times daily with a meal.  . Cholecalciferol (VITAMIN D) 2000 units CAPS Take 4,000 Units by mouth daily.   . clopidogrel (PLAVIX) 75 MG tablet TAKE 1 TABLET BY MOUTH EVERY DAY TO PREVENT HEART ATTACK  . gabapentin (NEURONTIN) 600 MG tablet TAKE 1 TABLET BY MOUTH EVERY EVENING AT BEDTIME  . losartan (COZAAR) 50 MG tablet Take 1/2 tablet twice daily for blood pressure.  . magnesium gluconate (MAGONATE) 500 MG tablet Take 500 mg by mouth daily.   . ranitidine (ZANTAC) 300 MG tablet TAKE 1 TABLET TWICE A DAY FOR REFLUX  . umeclidinium-vilanterol (ANORO ELLIPTA) 62.5-25 MCG/INH AEPB Inhale into the lungs.   No current facility-administered medications on file prior to visit.      Allergies:  Allergies  Allergen Reactions  . Nexium [Esomeprazole Magnesium] Diarrhea     Medical History:  Past Medical History:  Diagnosis Date  . A-fib ([place])   . Biventricular cardiac pacemaker in situ    05/06/13  Old AICD explanted and 6949 lead capped  RV lead  Medtronic 5076 [id number]  LV lead  Medtronic 4194  [id number] V Medtronic biventricular pacer  PVX [id number]   . Diabetes mellitus without complication ([place])   . GERD (gastroesophageal reflux disease)   . History of duodenal ulcer   . History of esophageal stricture   . Hyperlipidemia   . Hypertension   . Occlusion and stenosis of carotid artery without mention of cerebral infarction   . Peripheral neuropathy   . Right groin pain 12/12/2014  . Secondary cardiomyopathy, unspecified   . Transient global amnesia    Family history- Reviewed and unchanged Social history- Reviewed and unchanged   Review of Systems:  Review of Systems  Constitutional: Negative for malaise/fatigue and weight loss.  HENT: Negative for ear discharge, ear pain, hearing loss (decreased  hearing) and tinnitus.   Eyes: Negative for blurred vision and double vision.  Respiratory: Negative for cough, shortness of breath and wheezing.   Cardiovascular: Negative for chest pain, palpitations, orthopnea, claudication and leg swelling.  Gastrointestinal: Negative for abdominal pain, blood in stool, constipation, diarrhea, heartburn, melena, nausea and vomiting.  Genitourinary: Negative.   Musculoskeletal: Negative for joint pain and myalgias.  Skin: Negative for rash.  Neurological: Negative for dizziness, tingling, sensory change, weakness and headaches.  Endo/Heme/Allergies: Negative for polydipsia.  Psychiatric/Behavioral: Negative.   All other systems reviewed and are negative.     Physical Exam: BP (!) 126/52   Pulse 62   Temp 97.9 F (36.6 C)   Ht 5\' 3"  (1.6 m)   Wt 148 lb (67.1 kg)   SpO2 97%   BMI 26.22 kg/m  Wt Readings from Last 3 Encounters:  07/24/18 148 lb (67.1 kg)  04/01/18 142 lb 9.6 oz (64.7 kg)  03/16/18 145 lb (65.8 kg)   General Appearance: Well nourished, in no apparent distress. Eyes: PERRLA, EOMs, conjunctiva no swelling or erythema Sinuses: No Frontal/maxillary tenderness ENT/Mouth: Bilateral ears obstructed by wax. No erythema, swelling, or exudate on post pharynx.  Tonsils not swollen or erythematous. Hearing normal.  Neck: Supple, thyroid normal.  Respiratory: Respiratory effort normal, BS equal bilaterally without rales, rhonchi, wheezing or stridor.  Cardio: RRR with no MRGs. Brisk peripheral pulses without edema.  Abdomen: Soft, + BS.  Non tender, no guarding, rebound, hernias, masses. Lymphatics: Non tender without lymphadenopathy.  Musculoskeletal: Full ROM, 5/5 strength, slow steady gait Skin: Warm, dry without rashes, lesions, ecchymosis.  Neuro: Cranial nerves intact. No cerebellar symptoms.  Psych: Awake and oriented X 3, normal affect, Insight and Judgment appropriate.    Izora Ribas, NP 11:49 AM [facility]

## 2018-07-24 NOTE — Patient Instructions (Addendum)
Please start weighing yourself daily   Hearing test [employer] Test with no obligation # [phone] Do not have to be a member Tues-Sat 10-6  Have had patient's get good cheaper hearing aids from [employer] The air version has good reviews.   Ear wax Use a dropper or use a cap to put peroxide, olive oil,mineral oil or canola oil in the effected ear- 2-3 times a week. Let it soak for 20-30 min then you can take a shower or use a baby bulb with warm water to wash out the ear wax.  Do not use Qtips  Blood pressure Please decrease losartan to 1/2 tab once daily - can take this either the morning or evening, whenever your blood pressure is higher.   Please monitor your blood pressure, as we get older our body can not respond to a low blood pressure as well as it did when we were younger, for this reason we want a bit higher of a blood pressure as you get older to avoid dizziness and fatigue which can lead to falls. [place] call if your blood pressure is consistently above 150/90.   Please monitor your blood pressure. If it is getting below 130/80 AND you are having fatigue with exertion, dizziness we may need to cut your blood pressure medication in half. Please call the office if this is happening. Hypotension As your heart beats, it forces blood through your body. This force is called blood pressure. If you have hypotension, you have low blood pressure. When your blood pressure is too low, you may not get enough blood to your brain. You may feel weak, feel lightheaded, have a fast heartbeat, or even pass out (faint). HOME CARE  Drink enough fluids to keep your pee (urine) clear or pale yellow.  Take all medicines as told by your doctor.  Get up slowly after sitting or lying down.  Wear support stockings as told by your doctor.  Maintain a healthy diet by including foods such as fruits, vegetables, nuts, whole grains, and lean meats. GET HELP IF:  You are throwing up  (vomiting) or have watery poop (diarrhea).  You have a fever for more than 2-3 days.  You feel more thirsty than usual.  You feel weak and tired. GET HELP RIGHT AWAY IF:   You pass out (faint).  You have chest pain or a fast or irregular heartbeat.  You lose feeling in part of your body.  You cannot move your arms or legs.  You have trouble speaking.  You get sweaty or feel lightheaded. MAKE SURE YOU:   Understand these instructions.  Will watch your condition.  Will get help right away if you are not doing well or get worse. Document Released: 02/19/2010 Document Revised: 07/28/2013 Document Reviewed: 05/28/2013 [facility] Patient Information 2015 [place], [place]. This information is not intended to replace advice given to you by your health care provider. Make sure you discuss any questions you have with your health care provider.

## 2018-07-24 NOTE — Addendum Note (Signed)
Addended by: Izora Ribas on: 07/24/2018 12:42 PM   Modules accepted: Orders

## 2018-07-25 LAB — CBC WITH DIFFERENTIAL/PLATELET
BASOS ABS: 84 {cells}/uL (ref 0–200)
Basophils Relative: 0.9 %
EOS ABS: 260 {cells}/uL (ref 15–500)
EOS PCT: 2.8 %
HEMATOCRIT: 40.8 % (ref 35.0–45.0)
Hemoglobin: 13.8 g/dL (ref 11.7–15.5)
LYMPHS ABS: 2976 {cells}/uL (ref 850–3900)
MCH: 30.7 pg (ref 27.0–33.0)
MCHC: 33.8 g/dL (ref 32.0–36.0)
MCV: 90.9 fL (ref 80.0–100.0)
MPV: 11.7 fL (ref 7.5–12.5)
Monocytes Relative: 7 %
NEUTROS PCT: 57.3 %
Neutro Abs: 5329 cells/uL (ref 1500–7800)
Platelets: 282 10*3/uL (ref 140–400)
RBC: 4.49 10*6/uL (ref 3.80–5.10)
RDW: 12.1 % (ref 11.0–15.0)
Total Lymphocyte: 32 %
WBC mixed population: 651 cells/uL (ref 200–950)
WBC: 9.3 10*3/uL (ref 3.8–10.8)

## 2018-07-25 LAB — COMPLETE METABOLIC PANEL WITH GFR
AG RATIO: 1.9 (calc) (ref 1.0–2.5)
ALT: 11 U/L (ref 6–29)
AST: 19 U/L (ref 10–35)
Albumin: 4.3 g/dL (ref 3.6–5.1)
Alkaline phosphatase (APISO): 59 U/L (ref 33–130)
BUN/Creatinine Ratio: 9 (calc) (ref 6–22)
BUN: 13 mg/dL (ref 7–25)
CALCIUM: 10 mg/dL (ref 8.6–10.4)
CO2: 27 mmol/L (ref 20–32)
CREATININE: 1.44 mg/dL — AB (ref 0.60–0.88)
Chloride: 103 mmol/L (ref 98–110)
GFR, EST NON AFRICAN AMERICAN: 32 mL/min/{1.73_m2} — AB (ref >=60)
GFR, Est African American: 37 mL/min/{1.73_m2} — ABNORMAL LOW (ref >=60)
Globulin: 2.3 g/dL (calc) (ref 1.9–3.7)
Glucose, Bld: 85 mg/dL (ref 65–99)
Potassium: 4.3 mmol/L (ref 3.5–5.3)
Sodium: 142 mmol/L (ref 135–146)
TOTAL PROTEIN: 6.6 g/dL (ref 6.1–8.1)
Total Bilirubin: 0.4 mg/dL (ref 0.2–1.2)

## 2018-07-25 LAB — HEMOGLOBIN A1C
EAG (MMOL/L): 6.8 (calc)
Hgb A1c MFr Bld: 5.9 % of total Hgb — ABNORMAL HIGH (ref <5.7)
MEAN PLASMA GLUCOSE: 123 (calc)

## 2018-07-25 LAB — MAGNESIUM: Magnesium: 2.1 mg/dL (ref 1.5–2.5)

## 2018-07-25 LAB — TSH: TSH: 2.06 m[IU]/L (ref 0.40–4.50)

## 2018-07-27 DIAGNOSIS — M8588 Other specified disorders of bone density and structure, other site: Secondary | ICD-10-CM | POA: Diagnosis not present

## 2018-07-27 DIAGNOSIS — N958 Other specified menopausal and perimenopausal disorders: Secondary | ICD-10-CM | POA: Diagnosis not present

## 2018-07-29 ENCOUNTER — Ambulatory Visit (INDEPENDENT_AMBULATORY_CARE_PROVIDER_SITE_OTHER): Payer: Medicare Other | Admitting: *Deleted

## 2018-07-29 ENCOUNTER — Telehealth: Payer: Self-pay | Admitting: Cardiology

## 2018-07-29 DIAGNOSIS — R55 Syncope and collapse: Secondary | ICD-10-CM

## 2018-07-29 DIAGNOSIS — I420 Dilated cardiomyopathy: Secondary | ICD-10-CM | POA: Diagnosis not present

## 2018-07-29 NOTE — Telephone Encounter (Signed)
Confirmed remote transmission w/ pt caregiver.   

## 2018-07-30 ENCOUNTER — Telehealth: Payer: Self-pay | Admitting: Internal Medicine

## 2018-07-30 NOTE — Telephone Encounter (Signed)
Transmission received 07/29/18, patient made aware.

## 2018-07-30 NOTE — Telephone Encounter (Signed)
New Message     1. Has your device fired? no  2. Is you device beeping? No   3. Are you experiencing draining or swelling at device site? No   4. Are you calling to see if we received your device transmission? yes  5. Have you passed out? No     Please route to [place]

## 2018-07-30 NOTE — Progress Notes (Signed)
Remote pacemaker transmission.   

## 2018-07-31 ENCOUNTER — Encounter: Payer: Self-pay | Admitting: Cardiology

## 2018-08-03 ENCOUNTER — Ambulatory Visit: Payer: Self-pay | Admitting: Physician Assistant

## 2018-08-04 ENCOUNTER — Ambulatory Visit (INDEPENDENT_AMBULATORY_CARE_PROVIDER_SITE_OTHER): Payer: Medicare Other | Admitting: Podiatry

## 2018-08-04 DIAGNOSIS — M79676 Pain in unspecified toe(s): Secondary | ICD-10-CM

## 2018-08-04 DIAGNOSIS — B351 Tinea unguium: Secondary | ICD-10-CM | POA: Diagnosis not present

## 2018-08-05 ENCOUNTER — Encounter (HOSPITAL_COMMUNITY): Payer: Self-pay | Admitting: Emergency Medicine

## 2018-08-05 ENCOUNTER — Emergency Department (HOSPITAL_COMMUNITY)
Admission: EM | Admit: 2018-08-05 | Discharge: 2018-08-05 | Disposition: A | Discharge status: Home / Self Care | Payer: Medicare Other | Attending: Emergency Medicine | Admitting: Emergency Medicine

## 2018-08-05 DIAGNOSIS — I5022 Chronic systolic (congestive) heart failure: Secondary | ICD-10-CM | POA: Diagnosis not present

## 2018-08-05 DIAGNOSIS — Z7982 Long term (current) use of aspirin: Secondary | ICD-10-CM | POA: Diagnosis not present

## 2018-08-05 DIAGNOSIS — Z7722 Contact with and (suspected) exposure to environmental tobacco smoke (acute) (chronic): Secondary | ICD-10-CM | POA: Insufficient documentation

## 2018-08-05 DIAGNOSIS — I130 Hypertensive heart and chronic kidney disease with heart failure and stage 1 through stage 4 chronic kidney disease, or unspecified chronic kidney disease: Secondary | ICD-10-CM | POA: Diagnosis not present

## 2018-08-05 DIAGNOSIS — Z79899 Other long term (current) drug therapy: Secondary | ICD-10-CM | POA: Diagnosis not present

## 2018-08-05 DIAGNOSIS — Z950 Presence of cardiac pacemaker: Secondary | ICD-10-CM | POA: Insufficient documentation

## 2018-08-05 DIAGNOSIS — R55 Syncope and collapse: Secondary | ICD-10-CM | POA: Insufficient documentation

## 2018-08-05 DIAGNOSIS — E1122 Type 2 diabetes mellitus with diabetic chronic kidney disease: Secondary | ICD-10-CM | POA: Insufficient documentation

## 2018-08-05 DIAGNOSIS — N183 Chronic kidney disease, stage 3 (moderate): Secondary | ICD-10-CM | POA: Diagnosis not present

## 2018-08-05 LAB — URINALYSIS, ROUTINE W REFLEX MICROSCOPIC
Bilirubin Urine: NEGATIVE
Glucose, UA: NEGATIVE mg/dL
Hgb urine dipstick: NEGATIVE
Ketones, ur: NEGATIVE mg/dL
Nitrite: NEGATIVE
Protein, ur: NEGATIVE mg/dL
Specific Gravity, Urine: 1.003 — ABNORMAL LOW (ref 1.005–1.030)
pH: 7 (ref 5.0–8.0)

## 2018-08-05 LAB — CBC
HCT: 38.8 % (ref 36.0–46.0)
Hemoglobin: 12.6 g/dL (ref 12.0–15.0)
MCH: 30.6 pg (ref 26.0–34.0)
MCHC: 32.5 g/dL (ref 30.0–36.0)
MCV: 94.2 fL (ref 78.0–100.0)
PLATELETS: 243 10*3/uL (ref 150–400)
RBC: 4.12 MIL/uL (ref 3.87–5.11)
RDW: 12.4 % (ref 11.5–15.5)
WBC: 9.4 10*3/uL (ref 4.0–10.5)

## 2018-08-05 LAB — BASIC METABOLIC PANEL WITH GFR
Anion gap: 10 (ref 5–15)
BUN: 15 mg/dL (ref 8–23)
CO2: 26 mmol/L (ref 22–32)
Calcium: 9.3 mg/dL (ref 8.9–10.3)
Chloride: 99 mmol/L (ref 98–111)
Creatinine, Ser: 1.54 mg/dL — ABNORMAL HIGH (ref 0.44–1.00)
GFR calc Af Amer: 33 mL/min — ABNORMAL LOW
GFR calc non Af Amer: 29 mL/min — ABNORMAL LOW
Glucose, Bld: 193 mg/dL — ABNORMAL HIGH (ref 70–99)
Potassium: 4.9 mmol/L (ref 3.5–5.1)
Sodium: 135 mmol/L (ref 135–145)

## 2018-08-05 LAB — I-STAT TROPONIN, ED: Troponin i, poc: 0.01 ng/mL (ref 0.00–0.08)

## 2018-08-05 LAB — CBG MONITORING, ED: Glucose-Capillary: 169 mg/dL — ABNORMAL HIGH (ref 70–99)

## 2018-08-05 MED ORDER — SODIUM CHLORIDE 0.9 % IV BOLUS
500.0000 mL | Freq: Once | INTRAVENOUS | Status: AC
  Administered 2018-08-05: 500 mL via INTRAVENOUS

## 2018-08-05 NOTE — ED Provider Notes (Signed)
[facility]GENC[facility] DEPARTMENT Provider Note   CSN: [id number] Arrival date & time: 08/05/18  1155     History   Chief Complaint Chief Complaint  Patient presents with  . Loss of Consciousness    HPI [name] is a 84 y.o. female.  HPI Patient is a 84 year old female presents the emergency department with syncope x2 today.  She had an event that occurred several days ago as well.  She is intermittently had syncope for the past year.  She has a Medtronic pacemaker.  This been interrogated by her team and found to be not an arrhythmia as the cause of her symptoms.  She has not seen neurology.  Today's episode occurred while she was sitting on the toilet.  She had syncope for several minutes.  She did not lose a pulse.  She did not require CPR.  She did not fall or strike her head.  She has a history of congestive heart failure and several weeks ago was on a advanced dose of her diuretic but this is been discontinued and she is back to as needed usage of her diuretic.  She is been eating and drinking normally.  No fevers or chills.  No cough or congestion.  No chest pain or shortness of breath.  No palpitations.  Patient without complaints at this time.    Past Medical History:  Diagnosis Date  . A-fib ([place])   . Biventricular cardiac pacemaker in situ    05/06/13  Old AICD explanted and 6949 lead capped  RV lead  Medtronic 5076 [id number]  LV lead  Medtronic 4194  [id number] V Medtronic biventricular pacer  PVX [id number]   . Diabetes mellitus without complication ([place])   . GERD (gastroesophageal reflux disease)   . History of duodenal ulcer   . History of esophageal stricture   . Hyperlipidemia   . Hypertension   . Occlusion and stenosis of carotid artery without mention of cerebral infarction   . Peripheral neuropathy   . Right groin pain 12/12/2014  . Secondary cardiomyopathy, unspecified   . Transient global amnesia     Patient Active Problem List   Diagnosis Date Noted  . ASHD (arteriosclerotic heart disease) 12/05/2014  . GERD (gastroesophageal reflux disease) 12/05/2014  . Prediabetes 05/21/2014  . Vitamin D deficiency 05/20/2014  . Medication management 05/20/2014  . CKD (chronic kidney disease), stage III ([place]) 06/21/2013  . History of vasodepressor syncope   . Biventricular cardiac pacemaker in situ   . Mixed hyperlipidemia   . Hypertension   . LBBB   . Chronic systolic heart failure ([place])   . Dilated cardiomyopathy, nonischemic     Past Surgical History:  Procedure Laterality Date  . ABDOMINAL HYSTERECTOMY    . APPENDECTOMY    . BI-VENTRICULAR PACEMAKER UPGRADE N/A 05/05/2013   Procedure: BI-VENTRICULAR PACEMAKER UPGRADE;  Surgeon: Evans Lance, MD;  Location: [facility] CATH LAB;  Service: Cardiovascular;  Laterality: N/A;  . BIV PACEMAKER GENERATOR CHANGE OUT  05/05/2013   MDT CRTP upgrade (previously implanted dual chamber ICD) by Dr Lovena Le.  New RV/LV leads placed.   Marland Kitchen BREAST SURGERY Left 1985   Negative  Biopsy  . CARDIAC CATHETERIZATION     04/2003  . CARDIAC DEFIBRILLATOR PLACEMENT  09/2004   MDT ICD implanted for primary prevention; device upgrade to CRTP in 04-2013  . CARPAL TUNNEL RELEASE    . CHOLECYSTECTOMY       OB History   None  Home Medications    Prior to Admission medications   Medication Sig Start Date End Date Taking? Authorizing Provider  aspirin EC 81 MG tablet Take 81 mg by mouth daily.   Yes [provider]  b complex vitamins capsule Take 1 capsule by mouth daily.    Yes [provider]  bumetanide (BUMEX) 1 MG tablet TAKE 1/2 TABLET BY MOUTH AS NEEDED FOR SWELLING 07/08/18  Yes Unk Pinto, MD  carvedilol (COREG) 6.25 MG tablet Take 6.25 mg by mouth 2 (two) times daily with a meal.   Yes [provider]  Cholecalciferol (VITAMIN D) 2000 units CAPS Take 4,000 Units by mouth daily.    Yes [provider]  clopidogrel (PLAVIX) 75 MG tablet TAKE 1  TABLET BY MOUTH EVERY DAY TO PREVENT HEART ATTACK 06/01/18  Yes Unk Pinto, MD  estradiol (ESTRACE) 0.1 MG/GM vaginal cream Place 1 Applicatorful vaginally at bedtime.   Yes [provider]  gabapentin (NEURONTIN) 600 MG tablet TAKE 1 TABLET BY MOUTH EVERY EVENING AT BEDTIME 06/01/18  Yes Unk Pinto, MD  losartan (COZAAR) 50 MG tablet Take 1/2 tablet daily for blood pressure. 07/24/18  Yes Liane Comber, NP  magnesium gluconate (MAGONATE) 500 MG tablet Take 500 mg by mouth daily.    Yes [provider]  ranitidine (ZANTAC) 300 MG tablet TAKE 1 TABLET TWICE A DAY FOR REFLUX 06/01/18  Yes Unk Pinto, MD    Family History Family History  Problem Relation Age of Onset  . Dementia Mother   . Diabetes Mother   . Heart Problems Father   . Heart attack Son   . Heart disease Son   . Heart disease Brother   . Heart attack Brother   . Cancer Maternal Grandmother        breast  . Stroke Daughter   . Hypertension Daughter   . Hyperlipidemia Daughter   . Diabetes Daughter   . Diabetes Sister   . Hypertension Sister   . Hyperlipidemia Sister   . Hypertension Sister   . Hyperlipidemia Sister   . Cancer Sister        Breast  . Heart Problems Sister     Social History Social History   Tobacco Use  . Smoking status: Passive Smoke Exposure - Never Smoker  . Smokeless tobacco: Never Used  . Tobacco comment: worked at [employer] Topics  . Alcohol use: No    Alcohol/week: 0.0 standard drinks  . Drug use: No     Allergies   Nexium [esomeprazole magnesium]   Review of Systems Review of Systems  All other systems reviewed and are negative.    Physical Exam Updated Vital Signs BP (!) 155/76   Pulse (!) 58   Resp 13   Wt 65.3 kg   SpO2 97%   BMI 25.51 kg/m   Physical Exam  Constitutional: She is oriented to person, place, and time. She appears well-developed and well-nourished. No distress.  HENT:  Head: Normocephalic and  atraumatic.  Eyes: EOM are normal.  Neck: Normal range of motion.  Cardiovascular: Normal rate, regular rhythm and normal heart sounds.  Pulmonary/Chest: Effort normal and breath sounds normal.  Abdominal: Soft. She exhibits no distension. There is no tenderness.  Musculoskeletal: Normal range of motion.  Neurological: She is alert and oriented to person, place, and time.  Skin: Skin is warm and dry.  Psychiatric: She has a normal mood and affect. Judgment normal.  Nursing note and vitals reviewed.  ED Treatments / Results  Labs (all labs ordered are listed, but only abnormal results are displayed) Labs Reviewed  BASIC METABOLIC PANEL - Abnormal; Notable for the following components:      Result Value   Glucose, Bld 193 (*)    Creatinine, Ser 1.54 (*)    GFR calc non Af Amer 29 (*)    GFR calc Af Amer 33 (*)    All other components within normal limits  URINALYSIS, ROUTINE W REFLEX MICROSCOPIC - Abnormal; Notable for the following components:   Specific Gravity, Urine 1.003 (*)    Leukocytes, UA TRACE (*)    Bacteria, UA RARE (*)    All other components within normal limits  CBG MONITORING, ED - Abnormal; Notable for the following components:   Glucose-Capillary 169 (*)    All other components within normal limits  CBC  I-STAT TROPONIN, ED    EKG None  Radiology No results found.  Procedures Procedures (including critical care time)  Medications Ordered in ED Medications  sodium chloride 0.9 % bolus 500 mL (0 mLs Intravenous Stopped 08/05/18 1418)     Initial Impression / Assessment and Plan / ED Course  I have reviewed the triage vital signs and the nursing notes.  Pertinent labs & imaging results that were available during my care of the patient were reviewed by me and considered in my medical decision making (see chart for details).     Pacemaker was interrogated at this time and reported no arrhythmias today.  She has had nonsustained ventricular  tachycardia in the past but these do not sound like they always resulted in near syncope or syncope.  She has not seen neurology and may benefit from outpatient EEG and neurology work-up.  At this time she is without complaints.  She is not orthostatic.  She feels better at this time.  She stable for discharge from the emergency department.  She is instructed to return to the ER for new or worsening symptoms.  All questions answered.  Final Clinical Impressions(s) / ED Diagnoses   Final diagnoses:  Syncope, unspecified syncope type    ED Discharge Orders    None       Jola Schmidt, MD 08/05/18 1630

## 2018-08-05 NOTE — ED Notes (Addendum)
Pt verbalized understanding discharge instructions and denies any further needs or questions at this time. VS stable,  See EDP assessment / note.

## 2018-08-05 NOTE — ED Triage Notes (Signed)
PT arrives from home where she had two syncopal episodes this morning. PT yelled for her daughter who was able to lower her into a chair both times. PT did not fall. PT's daughter is not in triage room. PT lives at home, but has a Actuary or family at home with her at all times. PT reports this is not the first time she has experienced this. Similar experience last Friday. She did not seek medical care at that time.

## 2018-08-05 NOTE — ED Triage Notes (Signed)
Daughter reports PT has had "weak spells" for the past 1.5 weeks. PT fainted at the grocery store last Friday. PT was packing bags and moving around more than normal this morning. PT fainted this morning while sitting at the kitchen table. Her daughter estimates that she was out for 5-6 minutes. PT came to, ambulated to the restroom, had a BM and urinated and fainted again while sitting on the toilet. PT's daughter held her up while sitting on the toilet. PT was out for 8-10 minutes this occurrence. PT did not fall or strike her head either time. 3 weeks to 1 month ago, PT began taking bumex once a day instead of as needed, they then increased this to 2x a day for 3 weeks. Friday they changed medication back to an as needed basis for swelling.

## 2018-08-05 NOTE — Progress Notes (Signed)
Subjective: 84  y.o. returns the office today for painful, elongated, thickened toenails which she cannot trim herself. Denies any redness or drainage around the nails. Denies any acute changes since last appointment and no new complaints today. Denies any systemic complaints such as fevers, chills, nausea, vomiting.   Objective: AAO 3, NAD DP/PT pulses palpable, CRT less than 3 seconds Nails hypertrophic, dystrophic, elongated, brittle, discolored 10. There is tenderness overlying the nails 1-5 bilaterally with the right hallux the worst. There is no surrounding erythema or drainage along the nail sites. No open lesions or pre-ulcerative lesions are identified. No other areas of tenderness bilateral lower extremities. No overlying edema, erythema, increased warmth. No pain with calf compression, swelling, warmth, erythema.  Assessment: Patient presents with symptomatic onychomycosis  Plan: -Treatment options including alternatives, risks, complications were discussed -Nails sharply debrided 10 without complication/bleeding.  -Discussed daily foot inspection. If there are any changes, to call the office immediately.  -Follow-up in 3 months or sooner if any problems are to arise. In the meantime, encouraged to call the office with any questions, concerns, changes symptoms.  Celesta Gentile, DPM

## 2018-08-05 NOTE — ED Notes (Signed)
Pt states she is unable to void at this time.

## 2018-08-17 DIAGNOSIS — I429 Cardiomyopathy, unspecified: Secondary | ICD-10-CM | POA: Diagnosis not present

## 2018-08-17 DIAGNOSIS — Z950 Presence of cardiac pacemaker: Secondary | ICD-10-CM | POA: Diagnosis not present

## 2018-08-17 DIAGNOSIS — R4020 Unspecified coma: Secondary | ICD-10-CM | POA: Diagnosis not present

## 2018-08-17 DIAGNOSIS — E785 Hyperlipidemia, unspecified: Secondary | ICD-10-CM | POA: Diagnosis not present

## 2018-08-17 DIAGNOSIS — R569 Unspecified convulsions: Secondary | ICD-10-CM | POA: Diagnosis not present

## 2018-08-17 DIAGNOSIS — G629 Polyneuropathy, unspecified: Secondary | ICD-10-CM | POA: Diagnosis not present

## 2018-08-17 DIAGNOSIS — R55 Syncope and collapse: Secondary | ICD-10-CM | POA: Diagnosis not present

## 2018-08-17 DIAGNOSIS — I10 Essential (primary) hypertension: Secondary | ICD-10-CM | POA: Diagnosis not present

## 2018-08-17 DIAGNOSIS — Z79899 Other long term (current) drug therapy: Secondary | ICD-10-CM | POA: Diagnosis not present

## 2018-08-17 DIAGNOSIS — R42 Dizziness and giddiness: Secondary | ICD-10-CM | POA: Diagnosis not present

## 2018-08-24 ENCOUNTER — Telehealth: Payer: Self-pay | Admitting: *Deleted

## 2018-08-24 NOTE — Telephone Encounter (Signed)
Per Dr Melford Aase, the daughter was advised to give her mother chelated magnesium 500 mg 1 time a day.

## 2018-08-27 NOTE — Progress Notes (Signed)
This very nice 84 y.o.  WWF presents for  follow up with HTN, HLD, Pre-Diabetes and Vitamin D Deficiency.      Patient was seen 3 weeks ago in the ER (08/05/2018) for 2 syncopal episodes. Pacer was interrogated and cardiac enzymes were OK. She was given IVF, observed and released. Per phone discussion with daughter, Izora Gala, apparently patient had some "twitching" of her Rt face an RUE not reported in ER note. Patient is scheduled to see Dr Brett Fairy.      Patient is treated for HTN (1988) & BP has been controlled at home. Patient had a PPM in 2004 replaced in 2008 with a AICD/Defib at age 89.  In 2010 a Stress Myoview was negative. Today's BP was initially elevated & rechecked at goal - 140/76. Patient has had no complaints of any cardiac type chest pain, palpitations, dyspnea / orthopnea / PND, dizziness, claudication, or dependent edema.     Hyperlipidemia is not controlled with diet & not aggressively treated for age considerations.  Patient denies myalgias or other med SE's. Last Lipids were not at goal:  Lab Results  Component Value Date   CHOL 197 04/01/2018   HDL 53 04/01/2018   LDLCALC 118 (H) 04/01/2018   TRIG 147 04/01/2018   CHOLHDL 3.7 04/01/2018      Also, the patient has history of PreDiabetes (A1c 6.0%/2012) and has had no symptoms of reactive hypoglycemia, diabetic polys, paresthesias or visual blurring.  Last A1c was  Lab Results  Component Value Date   HGBA1C 5.9 (H) 07/24/2018      Further, the patient also has history of Vitamin D Deficiency ("33"/2008) and supplements vitamin D without any suspected side-effects. Last vitamin D was at goal:  Lab Results  Component Value Date   VD25OH 71 08/28/2018   Current Outpatient Medications on File Prior to Visit  Medication Sig  . aspirin EC 81 MG tablet Take 81 mg by mouth daily.  Marland Kitchen b complex vitamins capsule Take 1 capsule by mouth daily.   . bumetanide (BUMEX) 1 MG tablet TAKE 1/2 TABLET BY MOUTH AS NEEDED FOR SWELLING    . carvedilol (COREG) 6.25 MG tablet Take 6.25 mg by mouth 2 (two) times daily with a meal.  . Cholecalciferol (VITAMIN D) 2000 units CAPS Take 4,000 Units by mouth daily.   . clopidogrel (PLAVIX) 75 MG tablet TAKE 1 TABLET BY MOUTH EVERY DAY TO PREVENT HEART ATTACK  . estradiol (ESTRACE) 0.1 MG/GM vaginal cream Place 1 Applicatorful vaginally at bedtime.  . gabapentin (NEURONTIN) 600 MG tablet TAKE 1 TABLET BY MOUTH EVERY EVENING AT BEDTIME  . losartan (COZAAR) 50 MG tablet Take 1/2 tablet daily for blood pressure.  . magnesium gluconate (MAGONATE) 500 MG tablet Take 500 mg by mouth daily.   . ranitidine (ZANTAC) 300 MG tablet TAKE 1 TABLET TWICE A DAY FOR REFLUX   No current facility-administered medications on file prior to visit.    Allergies  Allergen Reactions  . Nexium [Esomeprazole Magnesium] Diarrhea   PMHx:   Past Medical History:  Diagnosis Date  . A-fib ([place])   . Biventricular cardiac pacemaker in situ    05/06/13  Old AICD explanted and 6949 lead capped  RV lead  Medtronic 5076 [id number]  LV lead  Medtronic 4194  [id number] V Medtronic biventricular pacer  PVX [id number]   . Diabetes mellitus without complication ([place])   . GERD (gastroesophageal reflux disease)   . History of duodenal ulcer   .  History of esophageal stricture   . Hyperlipidemia   . Hypertension   . Occlusion and stenosis of carotid artery without mention of cerebral infarction   . Peripheral neuropathy   . Right groin pain 12/12/2014  . Secondary cardiomyopathy, unspecified   . Transient global amnesia    Immunization History  Administered Date(s) Administered  . DT 10/03/2015  . Influenza, High Dose Seasonal PF 10/19/2013, 08/06/2016, 08/28/2018  . Influenza-Unspecified 10/08/2012, 02/03/2015  . Pneumococcal Conjugate-13 08/31/2014  . Pneumococcal-Unspecified 12/09/2002  . Td 12/09/2002  . Zoster 08/06/2016   Past Surgical History:  Procedure Laterality Date  . ABDOMINAL HYSTERECTOMY    .  APPENDECTOMY    . BI-VENTRICULAR PACEMAKER UPGRADE N/A 05/05/2013   Procedure: BI-VENTRICULAR PACEMAKER UPGRADE;  Surgeon: Evans Lance, MD;  Location: [facility] CATH LAB;  Service: Cardiovascular;  Laterality: N/A;  . BIV PACEMAKER GENERATOR CHANGE OUT  05/05/2013   MDT CRTP upgrade (previously implanted dual chamber ICD) by Dr Lovena Le.  New RV/LV leads placed.   Marland Kitchen BREAST SURGERY Left 1985   Negative  Biopsy  . CARDIAC CATHETERIZATION     04/2003  . CARDIAC DEFIBRILLATOR PLACEMENT  09/2004   MDT ICD implanted for primary prevention; device upgrade to CRTP in 04-2013  . CARPAL TUNNEL RELEASE    . CHOLECYSTECTOMY     FHx:    Reviewed / unchanged  SHx:    Reviewed / unchanged   Systems Review:  Constitutional: Denies fever, chills, wt changes, headaches, insomnia, fatigue, night sweats, change in appetite. Eyes: Denies redness, blurred vision, diplopia, discharge, itchy, watery eyes.  ENT: Denies discharge, congestion, post nasal drip, epistaxis, sore throat, earache, hearing loss, dental pain, tinnitus, vertigo, sinus pain, snoring.  CV: Denies chest pain, palpitations, irregular heartbeat, syncope, dyspnea, diaphoresis, orthopnea, PND, claudication or edema. Respiratory: denies cough, dyspnea, DOE, pleurisy, hoarseness, laryngitis, wheezing.  Gastrointestinal: Denies dysphagia, odynophagia, heartburn, reflux, water brash, abdominal pain or cramps, nausea, vomiting, bloating, diarrhea, constipation, hematemesis, melena, hematochezia  or hemorrhoids. Genitourinary: Denies dysuria, frequency, urgency, nocturia, hesitancy, discharge, hematuria or flank pain. Musculoskeletal: Denies arthralgias, myalgias, stiffness, jt. swelling, pain, limping or strain/sprain.  Skin: Denies pruritus, rash, hives, warts, acne, eczema or change in skin lesion(s). Neuro: No weakness, tremor, incoordination, spasms, paresthesia or pain. Psychiatric: Denies confusion, memory loss or sensory loss. Endo: Denies change in  weight, skin or hair change.  Heme/Lymph: No excessive bleeding, bruising or enlarged lymph nodes.  Physical Exam  BP 140/76   Pulse 68   Temp 97.9 F (36.6 C)   Resp 16   Ht 5\' 3"  (1.6 m)   Wt 147 lb 12.8 oz (67 kg)   BMI 26.18 kg/m   Appears  well nourished, well groomed  and in no distress.  Eyes: PERRLA, EOMs, conjunctiva no swelling or erythema. Sinuses: No frontal/maxillary tenderness ENT/Mouth: EAC's clear, TM's nl w/o erythema, bulging. Nares clear w/o erythema, swelling, exudates. Oropharynx clear without erythema or exudates. Oral hygiene is good. Tongue normal, non obstructing. Hearing intact.  Neck: Supple. Thyroid not palpable. Car 2+/2+ without bruits, nodes or JVD. Chest: Respirations nl with BS clear & equal w/o rales, rhonchi, wheezing or stridor.  Cor: Heart sounds normal w/ regular rate and rhythm without sig. murmurs, gallops, clicks or rubs. Peripheral pulses normal and equal  without edema.  Abdomen: Soft & bowel sounds normal. Non-tender w/o guarding, rebound, hernias, masses or organomegaly.  Lymphatics: Unremarkable.  Musculoskeletal: Full ROM all peripheral extremities, joint stability, 5/5 strength and normal gait.  Skin:  Warm, dry without exposed rashes, lesions or ecchymosis apparent.  Neuro: Cranial nerves intact, reflexes equal bilaterally. Sensory-motor testing grossly intact. Tendon reflexes grossly intact.  Pysch: Alert & oriented x 3.  Insight and judgement nl & appropriate. No ideations.  Assessment and Plan:  1. Essential hypertension  - Continue medication, monitor blood pressure at home.  - Continue DASH diet.  Reminder to go to the ER if any CP,  SOB, nausea, dizziness, severe HA, changes vision/speech.  - CBC with Differential/Platelet - COMPLETE METABOLIC PANEL WITH GFR - Magnesium - TSH  2. CKD (chronic kidney disease), stage III ([facility])  - COMPLETE METABOLIC PANEL WITH GFR  3. Prediabetes  - Continue diet, exercise, lifestyle  modifications.  - Monitor appropriate labs.  - COMPLETE METABOLIC PANEL WITH GFR  4. Vitamin D deficiency  - Continue supplementation.   - VITAMIN D 25 Hydroxyl  5. History of syncope  - CBC with Differential/Platelet - COMPLETE METABOLIC PANEL WITH GFR  6. Medication management  - CBC with Differential/Platelet - COMPLETE METABOLIC PANEL WITH GFR - Magnesium - TSH - VITAMIN D 25 Hydroxyl  7. Seizure ([place]) ? Secondary to TIA  - EEG; Future  - has f/u scheduled w/Dr Dohmeier  8. Need for prophylactic vaccination and inoculation against influenza  - Flu vaccine HIGH DOSE PF (Fluzone High dose)      Discussed  regular exercise, BP monitoring, weight control to achieve/maintain BMI less than 25 and discussed med and SE's. Recommended labs to assess and monitor clinical status with further disposition pending results of labs. Over 30 minutes of exam, counseling, chart review was performed.

## 2018-08-28 ENCOUNTER — Encounter: Payer: Self-pay | Admitting: Internal Medicine

## 2018-08-28 ENCOUNTER — Ambulatory Visit (INDEPENDENT_AMBULATORY_CARE_PROVIDER_SITE_OTHER): Payer: Medicare Other | Admitting: Internal Medicine

## 2018-08-28 VITALS — BP 140/76 | HR 68 | Temp 97.9°F | Resp 16 | Ht 63.0 in | Wt 147.8 lb

## 2018-08-28 DIAGNOSIS — I2510 Atherosclerotic heart disease of native coronary artery without angina pectoris: Secondary | ICD-10-CM | POA: Diagnosis not present

## 2018-08-28 DIAGNOSIS — Z23 Encounter for immunization: Secondary | ICD-10-CM

## 2018-08-28 DIAGNOSIS — R7303 Prediabetes: Secondary | ICD-10-CM

## 2018-08-28 DIAGNOSIS — R569 Unspecified convulsions: Secondary | ICD-10-CM | POA: Diagnosis not present

## 2018-08-28 DIAGNOSIS — N183 Chronic kidney disease, stage 3 unspecified: Secondary | ICD-10-CM

## 2018-08-28 DIAGNOSIS — Z79899 Other long term (current) drug therapy: Secondary | ICD-10-CM

## 2018-08-28 DIAGNOSIS — Z87898 Personal history of other specified conditions: Secondary | ICD-10-CM | POA: Diagnosis not present

## 2018-08-28 DIAGNOSIS — E559 Vitamin D deficiency, unspecified: Secondary | ICD-10-CM

## 2018-08-28 DIAGNOSIS — I10 Essential (primary) hypertension: Secondary | ICD-10-CM | POA: Diagnosis not present

## 2018-08-28 NOTE — Patient Instructions (Addendum)
You should not Drive a car  You should not climb ladders,  stand on chairs  or be in a position where you could fall  & injure yourself if you had a seizure   +++++++++++++++++++++++++++++++++++++ Seizure, Adult A seizure is a sudden burst of abnormal electrical activity in the brain. The abnormal activity temporarily interrupts normal brain function, causing a person to experience any of the following:  Involuntary movements.  Changes in awareness or consciousness.  Uncontrollable shaking (convulsions).  Seizures usually last from 30 seconds to 2 minutes. They usually do not cause permanent brain damage unless they are prolonged. What can cause a seizure to happen? Seizures can happen for many reasons including:  A fever.  Low blood sugar.  A medicine.  An illnesses.  A brain injury.  Some people who have a seizure never have another one. People who have repeated seizures have a condition called epilepsy. What are the symptoms of a seizure? Symptoms of a seizure vary greatly from person to person. They include:  Convulsions.  Stiffening of the body.  Involuntary movements of the arms or legs.  Loss of consciousness.  Breathing problems.  Falling suddenly.  Confusion.  Head nodding.  Eye blinking or fluttering.  Lip smacking.  Drooling.  Rapid eye movements.  Grunting.  Loss of bladder control and bowel control.  Staring.  Unresponsiveness.  Some people have symptoms right before a seizure happens (aura) and right after a seizure happens. Symptoms of an aura include:  Fear or anxiety.  Nausea.  Feeling like the room is spinning (vertigo).  A feeling of having seen or heard something before (deja vu).  Odd tastes or smells.  Changes in vision, such as seeing flashing lights or spots.  Symptoms that may follow a seizure include:  Confusion.  Sleepiness.  Headache.  Weakness of one side of the body.  Follow these instructions  at home: Medicines   Take over-the-counter and prescription medicines only as told by your health care provider.  Avoid any substances that may prevent your medicine from working properly, such as alcohol. Activity  Do not drive, swim, or do any other activities that would be dangerous if you had another seizure. Wait until your health care provider approves.  If you live in the [place] check with your local DMV (department of motor vehicles) to find out about the local driving laws. Each state has specific rules about when you can legally return to driving.  Get enough rest. Lack of sleep can make seizures more likely to occur. Educating others Teach friends and family what to do if you have a seizure. They should:  Lay you on the ground to prevent a fall.  Cushion your head and body.  Loosen any tight clothing around your neck.  Turn you on your side. If vomiting occurs, this helps keep your airway clear.  Stay with you until you recover.  Not hold you down. Holding you down will not stop the seizure.  Not put anything in your mouth.  Know whether or not you need emergency care.  General instructions  Contact your health care provider each time you have a seizure.  Avoid anything that has ever triggered a seizure for you.  Keep a seizure diary. Record what you remember about each seizure, especially anything that might have triggered the seizure.  Keep all follow-up visits as told by your health care provider. This is important. Contact a health care provider if:  You have another seizure.  You have seizures more often.  Your seizure symptoms change.  You continue to have seizures with treatment.  You have symptoms of an infection or illness. They might increase your risk of having a seizure. Get help right away if:  You have a seizure: ? That lasts longer than 5 minutes. ? That is different than previous seizures. ? That leaves you unable to speak or use a  part of your body. ? That makes it harder to breathe. ? After a head injury.  You have: ? Multiple seizures in a row. ? Confusion or a severe headache right after a seizure.  You are having seizures more often.  You do not wake up immediately after a seizure.  You injure yourself during a seizure. These symptoms may represent a serious problem that is an emergency. Do not wait to see if the symptoms will go away. Get medical help right away. Call your local emergency services (911 in the [place] Do not drive yourself to the hospital. +++++++++++++++++++++++++++++++  Epilepsy Epilepsy is a condition in which a person has repeated seizures over time. A seizure is a sudden burst of abnormal electrical and chemical activity in the brain. Seizures can cause a change in attention, behavior, or the ability to remain awake and alert (altered mental status). Epilepsy increases a person's risk of falls, accidents, and injury. It can also lead to complications, including:  Depression.  Poor memory.  Sudden unexplained death in epilepsy (SUDEP). This complication is rare, and its cause is not known.  Most people with epilepsy lead normal lives. What are the causes? This condition may be caused by:  A head injury.  An injury that happens at birth.  A high fever during childhood.  A stroke.  Bleeding that goes into or around the brain.  Certain medicines and drugs.  Having too little oxygen for a long period of time.  Abnormal brain development.  Certain infections, such as meningitis and encephalitis.  Brain tumors.  Conditions that are passed along from parent to child (are hereditary).  What are the signs or symptoms? Symptoms of a seizure vary greatly from person to person. They include:  Convulsions.  Stiffening of the body.  Involuntary movements of the arms or legs.  Loss of consciousness.  Breathing problems.  Falling suddenly.  Confusion.  Head  nodding.  Eye blinking or fluttering.  Lip smacking.  Drooling.  Rapid eye movements.  Grunting.  Loss of bladder control and bowel control.  Staring.  Unresponsiveness.  Some people have symptoms right before a seizure happens (aura) and right after a seizure happens. Symptoms of an aura include:  Fear or anxiety.  Nausea.  Feeling like the room is spinning (vertigo).  A feeling of having seen or heard something before (deja vu).  Odd tastes or smells.  Changes in vision, such as seeing flashing lights or spots.  Symptoms that follow a seizure include:  Confusion.  Sleepiness.  Headache.  How is this diagnosed? This condition is diagnosed based on:  Your symptoms.  Your medical history.  A physical exam.  A neurological exam. A neurological exam is similar to a physical exam. It involves checking your strength, reflexes, coordination, and sensations.  Tests, such as: ? An electroencephalogram (EEG). This is a painless test that creates a diagram of your brain waves. ? An MRI of the brain. ? A CT scan of the brain. ? A lumbar puncture, also called a spinal tap. ? Blood tests to check for  signs of infection or abnormal blood chemistry.  How is this treated? There is no cure for this condition, but treatment can help control seizures. Treatment may involve:  Taking medicines to control seizures. These include medicines to prevent seizures and medicines to stop seizures as they occur.  Having a device called a vagus nerve stimulator implanted in the chest. The device sends electrical impulses to the vagus nerve and to the brain to prevent seizures. This treatment may be recommended if medicines do not help.  Brain surgery. There are several kinds of surgeries that may be done to stop seizures from happening or to reduce how often seizures happen.  Having regular blood tests. You may need to have blood tests regularly to check that you are getting the  right amount of medicine.  Once this condition has been diagnosed, it is important to begin treatment as soon as possible. For some people, epilepsy eventually goes away. Follow these instructions at home: Medicines   Take over-the-counter and prescription medicines only as told by your health care provider.  Avoid any substances that may prevent your medicine from working properly, such as alcohol. Activity  Get enough rest. Lack of sleep can make seizures more likely to occur.  Follow instructions from your health care provider about driving, swimming, and doing any other activities that would be dangerous if you had a seizure. Educating others Teach friends and family what to do if you have a seizure. They should:  Lay you on the ground to prevent a fall.  Cushion your head and body.  Loosen any tight clothing around your neck.  Turn you on your side. If vomiting occurs, this helps keep your airway clear.  Stay with you until you recover.  Not hold you down. Holding you down will not stop the seizure.  Not put anything in your mouth.  Know whether or not you need emergency care.  General instructions  Avoid anything that has ever triggered a seizure for you.  Keep a seizure diary. Record what you remember about each seizure, especially anything that might have triggered the seizure.  Keep all follow-up visits as told by your health care provider. This is important. Contact a health care provider if:  Your seizure pattern changes.  You have symptoms of infection or another illness. This might increase your risk of having a seizure. Get help right away if:  You have a seizure that does not stop after 5 minutes.  You have several seizures in a row without a complete recovery in between seizures.  You have a seizure that makes it harder to breathe.  You have a seizure that is different from previous seizures.  You have a seizure that leaves you unable to speak  or use a part of your body.  You did not wake up immediately after a seizure.

## 2018-08-29 ENCOUNTER — Encounter: Payer: Self-pay | Admitting: Internal Medicine

## 2018-08-29 LAB — COMPLETE METABOLIC PANEL WITH GFR
AG Ratio: 2.1 (calc) (ref 1.0–2.5)
ALBUMIN MSPROF: 4.2 g/dL (ref 3.6–5.1)
ALKALINE PHOSPHATASE (APISO): 59 U/L (ref 33–130)
ALT: 12 U/L (ref 6–29)
AST: 16 U/L (ref 10–35)
BUN/Creatinine Ratio: 11 (calc) (ref 6–22)
BUN: 14 mg/dL (ref 7–25)
CHLORIDE: 105 mmol/L (ref 98–110)
CO2: 27 mmol/L (ref 20–32)
Calcium: 9.7 mg/dL (ref 8.6–10.4)
Creat: 1.31 mg/dL — ABNORMAL HIGH (ref 0.60–0.88)
GFR, Est African American: 41 mL/min/{1.73_m2} — ABNORMAL LOW (ref >=60)
GFR, Est Non African American: 36 mL/min/{1.73_m2} — ABNORMAL LOW (ref >=60)
GLUCOSE: 96 mg/dL (ref 65–99)
Globulin: 2 g/dL (calc) (ref 1.9–3.7)
POTASSIUM: 4.4 mmol/L (ref 3.5–5.3)
SODIUM: 142 mmol/L (ref 135–146)
TOTAL PROTEIN: 6.2 g/dL (ref 6.1–8.1)
Total Bilirubin: 0.5 mg/dL (ref 0.2–1.2)

## 2018-08-29 LAB — CBC WITH DIFFERENTIAL/PLATELET
BASOS PCT: 0.9 %
Basophils Absolute: 78 cells/uL (ref 0–200)
EOS PCT: 3 %
Eosinophils Absolute: 261 cells/uL (ref 15–500)
HCT: 39 % (ref 35.0–45.0)
HEMOGLOBIN: 13.3 g/dL (ref 11.7–15.5)
Lymphs Abs: 1914 cells/uL (ref 850–3900)
MCH: 30.9 pg (ref 27.0–33.0)
MCHC: 34.1 g/dL (ref 32.0–36.0)
MCV: 90.5 fL (ref 80.0–100.0)
MONOS PCT: 7.3 %
MPV: 12.1 fL (ref 7.5–12.5)
NEUTROS ABS: 5812 {cells}/uL (ref 1500–7800)
Neutrophils Relative %: 66.8 %
PLATELETS: 293 10*3/uL (ref 140–400)
RBC: 4.31 10*6/uL (ref 3.80–5.10)
RDW: 12.4 % (ref 11.0–15.0)
TOTAL LYMPHOCYTE: 22 %
WBC mixed population: 635 cells/uL (ref 200–950)
WBC: 8.7 10*3/uL (ref 3.8–10.8)

## 2018-08-29 LAB — MAGNESIUM: MAGNESIUM: 2 mg/dL (ref 1.5–2.5)

## 2018-08-29 LAB — VITAMIN D 25 HYDROXY (VIT D DEFICIENCY, FRACTURES): Vit D, 25-Hydroxy: 71 ng/mL (ref 30–100)

## 2018-08-29 LAB — TSH: TSH: 2 mIU/L (ref 0.40–4.50)

## 2018-09-01 ENCOUNTER — Other Ambulatory Visit: Payer: Self-pay | Admitting: Internal Medicine

## 2018-09-01 LAB — CUP PACEART REMOTE DEVICE CHECK
Battery Remaining Longevity: 46 mo
Brady Statistic AP VP Percent: 23.9 %
Brady Statistic RA Percent Paced: 23.84 %
Brady Statistic RV Percent Paced: 99.4 %
Implantable Lead Implant Date: 20051007
Implantable Lead Implant Date: 20140528
Implantable Lead Implant Date: 20140528
Implantable Lead Location: 753858
Implantable Lead Model: 4194
Lead Channel Impedance Value: 399 Ohm
Lead Channel Impedance Value: 418 Ohm
Lead Channel Impedance Value: 437 Ohm
Lead Channel Impedance Value: 475 Ohm
Lead Channel Pacing Threshold Amplitude: 1.375 V
Lead Channel Pacing Threshold Pulse Width: 0.4 ms
Lead Channel Pacing Threshold Pulse Width: 0.4 ms
Lead Channel Sensing Intrinsic Amplitude: 1.25 mV
Lead Channel Sensing Intrinsic Amplitude: 12.125 mV
Lead Channel Setting Pacing Amplitude: 2 V
Lead Channel Setting Pacing Amplitude: 2.5 V
Lead Channel Setting Pacing Pulse Width: 0.4 ms
Lead Channel Setting Pacing Pulse Width: 0.4 ms
MDC IDC LEAD LOCATION: 753859
MDC IDC LEAD LOCATION: 753860
MDC IDC MSMT BATTERY VOLTAGE: 2.98 V
MDC IDC MSMT LEADCHNL LV IMPEDANCE VALUE: 285 Ohm
MDC IDC MSMT LEADCHNL LV IMPEDANCE VALUE: 456 Ohm
MDC IDC MSMT LEADCHNL LV IMPEDANCE VALUE: 551 Ohm
MDC IDC MSMT LEADCHNL LV IMPEDANCE VALUE: 570 Ohm
MDC IDC MSMT LEADCHNL LV PACING THRESHOLD PULSEWIDTH: 0.4 ms
MDC IDC MSMT LEADCHNL RA PACING THRESHOLD AMPLITUDE: 0.375 V
MDC IDC MSMT LEADCHNL RA SENSING INTR AMPL: 1.25 mV
MDC IDC MSMT LEADCHNL RV IMPEDANCE VALUE: 475 Ohm
MDC IDC MSMT LEADCHNL RV PACING THRESHOLD AMPLITUDE: 0.625 V
MDC IDC MSMT LEADCHNL RV SENSING INTR AMPL: 12.125 mV
MDC IDC PG IMPLANT DT: 20140528
MDC IDC SESS DTM: 20190821183324
MDC IDC SET LEADCHNL RV PACING AMPLITUDE: 2 V
MDC IDC SET LEADCHNL RV SENSING SENSITIVITY: 0.9 mV
MDC IDC STAT BRADY AP VS PERCENT: 0.03 %
MDC IDC STAT BRADY AS VP PERCENT: 75.83 %
MDC IDC STAT BRADY AS VS PERCENT: 0.24 %

## 2018-09-02 ENCOUNTER — Ambulatory Visit (INDEPENDENT_AMBULATORY_CARE_PROVIDER_SITE_OTHER): Payer: Medicare Other | Admitting: Neurology

## 2018-09-02 DIAGNOSIS — R55 Syncope and collapse: Secondary | ICD-10-CM

## 2018-09-02 DIAGNOSIS — R569 Unspecified convulsions: Secondary | ICD-10-CM

## 2018-09-03 ENCOUNTER — Ambulatory Visit: Payer: Medicare Other | Admitting: Neurology

## 2018-09-03 ENCOUNTER — Encounter

## 2018-09-09 ENCOUNTER — Telehealth: Payer: Self-pay | Admitting: Neurology

## 2018-09-09 DIAGNOSIS — R55 Syncope and collapse: Secondary | ICD-10-CM | POA: Insufficient documentation

## 2018-09-09 NOTE — Telephone Encounter (Signed)
[name] EEG was slower than normal, and showed bicentral sleep spindles, no epileptiform activity was noted.   Larey Seat, MD

## 2018-09-09 NOTE — Procedures (Signed)
This is an EEG recording for the patient [name], performed on 09-02-2018 with a total recording time of 22 minutes and 10 seconds.  Technician is [employer].  [name] is a 84 year old Caucasian female with a history of hypertension, hyperlipidemia, prediabetes and vitamin D deficiency that had recently 2 syncopal episodes.  Cardiac enzymes were normal. Possible seizures to be ruled out.   The recording begins with eye opening and eye closure observation.  A posterior dominant rhythm is observed at 7 Hz.  There is notable frontopolar fast activity- an artifact that suggests beta fast activity to be present in the left frontal polar region.  As this EEG study progresses hyperventilation was deferred and photic stimulation performed.   The posterior dominant rhythm appears now clearly at 7 Hz.  Excessive eye blinking is noted.   Photic stimulation did not lead to photic entrainment,  especially not at the at 9 and 12, 18 Hz frequencies.  Photic stimulation maneuvers concluded at 24 Hz when there was no longer any photic response  noted.  The patient fell asleep after the photic stimulation maneuver notable are the bitemporal spindles and the central vertex sharp waves.  This brief period of sleep is followed by a sudden arousal unrelated to EEG activity.  By central face reversal activity is noted at about 4 Hz, this is not spike-wave activity, but appears to be sleep-related EEG activity.The EKG remained in regular rhythm throughout with isolated  PVCs.    Conclusion:  This EEG is dominated by a very slow posterior background, a frequency of 7 Hz is considered encephalopathic.   This can be seen in advanced age, related to the intake of some medications or substances, and also with dementia.   However epileptiform activity was not noted.   The study was reviewed in the awake and asleep phases of consciousness.  Larey Seat, MD

## 2018-09-10 NOTE — Telephone Encounter (Signed)
Called and spoke with pt daughter (on [place]). Relayed results of EEG. Reminded them of appt on 10/14/18 with Dr. Brett Fairy.   Daughter feels her mother has gotten more mean. Having increased memory issues. Advised them to f/u with PCP to do initial evaluation for memory problems. She can f/u with [place] after if they feel she needs further eval/management. She verbalized understanding.

## 2018-09-14 ENCOUNTER — Other Ambulatory Visit: Payer: Self-pay | Admitting: *Deleted

## 2018-09-14 MED ORDER — GLUCOSE BLOOD VI STRP: ORAL_STRIP | 3 refills | Status: DC

## 2018-09-16 ENCOUNTER — Other Ambulatory Visit: Payer: Self-pay | Admitting: Internal Medicine

## 2018-09-16 MED ORDER — LOSARTAN POTASSIUM 50 MG PO TABS: ORAL_TABLET | ORAL | 1 refills | Status: DC

## 2018-10-03 ENCOUNTER — Emergency Department (HOSPITAL_COMMUNITY): Payer: Medicare Other

## 2018-10-03 ENCOUNTER — Encounter (HOSPITAL_COMMUNITY): Payer: Self-pay | Admitting: Emergency Medicine

## 2018-10-03 ENCOUNTER — Emergency Department (HOSPITAL_COMMUNITY)
Admission: EM | Admit: 2018-10-03 | Discharge: 2018-10-03 | Disposition: A | Discharge status: Home / Self Care | Payer: Medicare Other | Attending: Emergency Medicine | Admitting: Emergency Medicine

## 2018-10-03 DIAGNOSIS — R55 Syncope and collapse: Secondary | ICD-10-CM | POA: Insufficient documentation

## 2018-10-03 DIAGNOSIS — R531 Weakness: Secondary | ICD-10-CM | POA: Diagnosis not present

## 2018-10-03 DIAGNOSIS — I130 Hypertensive heart and chronic kidney disease with heart failure and stage 1 through stage 4 chronic kidney disease, or unspecified chronic kidney disease: Secondary | ICD-10-CM | POA: Diagnosis not present

## 2018-10-03 DIAGNOSIS — Z7722 Contact with and (suspected) exposure to environmental tobacco smoke (acute) (chronic): Secondary | ICD-10-CM | POA: Diagnosis not present

## 2018-10-03 DIAGNOSIS — Z743 Need for continuous supervision: Secondary | ICD-10-CM | POA: Diagnosis not present

## 2018-10-03 DIAGNOSIS — I5022 Chronic systolic (congestive) heart failure: Secondary | ICD-10-CM | POA: Insufficient documentation

## 2018-10-03 DIAGNOSIS — N183 Chronic kidney disease, stage 3 (moderate): Secondary | ICD-10-CM | POA: Diagnosis not present

## 2018-10-03 DIAGNOSIS — I10 Essential (primary) hypertension: Secondary | ICD-10-CM | POA: Diagnosis not present

## 2018-10-03 LAB — COMPREHENSIVE METABOLIC PANEL
ALT: 12 U/L (ref 0–44)
ANION GAP: 9 (ref 5–15)
AST: 19 U/L (ref 15–41)
Albumin: 3.6 g/dL (ref 3.5–5.0)
Alkaline Phosphatase: 44 U/L (ref 38–126)
BILIRUBIN TOTAL: 0.7 mg/dL (ref 0.3–1.2)
BUN: 17 mg/dL (ref 8–23)
CO2: 23 mmol/L (ref 22–32)
Calcium: 9.1 mg/dL (ref 8.9–10.3)
Chloride: 108 mmol/L (ref 98–111)
Creatinine, Ser: 1.37 mg/dL — ABNORMAL HIGH (ref 0.44–1.00)
GFR calc Af Amer: 38 mL/min — ABNORMAL LOW (ref >60)
GFR calc non Af Amer: 33 mL/min — ABNORMAL LOW (ref >60)
GLUCOSE: 105 mg/dL — AB (ref 70–99)
POTASSIUM: 4 mmol/L (ref 3.5–5.1)
Sodium: 140 mmol/L (ref 135–145)
TOTAL PROTEIN: 5.7 g/dL — AB (ref 6.5–8.1)

## 2018-10-03 LAB — CBC WITH DIFFERENTIAL/PLATELET
ABS IMMATURE GRANULOCYTES: 0.03 10*3/uL (ref 0.00–0.07)
BASOS ABS: 0.1 10*3/uL (ref 0.0–0.1)
Basophils Relative: 1 %
Eosinophils Absolute: 0.2 10*3/uL (ref 0.0–0.5)
Eosinophils Relative: 2 %
HCT: 39.3 % (ref 36.0–46.0)
Hemoglobin: 12.8 g/dL (ref 12.0–15.0)
IMMATURE GRANULOCYTES: 0 %
Lymphocytes Relative: 21 %
Lymphs Abs: 1.8 10*3/uL (ref 0.7–4.0)
MCH: 30.5 pg (ref 26.0–34.0)
MCHC: 32.6 g/dL (ref 30.0–36.0)
MCV: 93.8 fL (ref 80.0–100.0)
MONOS PCT: 7 %
Monocytes Absolute: 0.6 10*3/uL (ref 0.1–1.0)
NEUTROS ABS: 5.9 10*3/uL (ref 1.7–7.7)
NEUTROS PCT: 69 %
PLATELETS: 229 10*3/uL (ref 150–400)
RBC: 4.19 MIL/uL (ref 3.87–5.11)
RDW: 12.3 % (ref 11.5–15.5)
WBC: 8.5 10*3/uL (ref 4.0–10.5)
nRBC: 0 % (ref 0.0–0.2)

## 2018-10-03 LAB — TROPONIN I

## 2018-10-03 MED ORDER — SODIUM CHLORIDE 0.9 % IV BOLUS
500.0000 mL | Freq: Once | INTRAVENOUS | Status: AC
  Administered 2018-10-03: 500 mL via INTRAVENOUS

## 2018-10-03 NOTE — ED Provider Notes (Signed)
Emergency Department Provider Note   I have reviewed the triage vital signs and the nursing notes.   HISTORY  Chief Complaint Near Syncope   HPI [name] is a 84 y.o. female with A-fib, HLD, HTN, and prior TGA's to the emergency department for evaluation of sudden onset generalized weakness and near syncope.  Patient was with her significant other when she was walking back into the house she realized she left her keys in the car to return to the car.  She sat down and slumped over in the car but remained conscious.  She was talking with her significant other while this episode was ongoing.  No seizure activity.  She denies any chest pain, shortness of breath, heart palpitations.  Patient states that she has had the symptoms in the past many times and often feels better with IV fluids.  No recent changes to medications.  No head trauma.  No sudden onset, severe headache symptoms.  Past Medical History:  Diagnosis Date  . A-fib ([place])   . Biventricular cardiac pacemaker in situ    05/06/13  Old AICD explanted and 6949 lead capped  RV lead  Medtronic 5076 [id number]  LV lead  Medtronic 4194  [id number] V Medtronic biventricular pacer  PVX [id number]   . Diabetes mellitus without complication ([place])   . GERD (gastroesophageal reflux disease)   . History of duodenal ulcer   . History of esophageal stricture   . Hyperlipidemia   . Hypertension   . Occlusion and stenosis of carotid artery without mention of cerebral infarction   . Peripheral neuropathy   . Right groin pain 12/12/2014  . Secondary cardiomyopathy, unspecified   . Transient global amnesia     Patient Active Problem List   Diagnosis Date Noted  . Syncope and collapse 09/09/2018  . ASHD (arteriosclerotic heart disease) 12/05/2014  . GERD (gastroesophageal reflux disease) 12/05/2014  . Prediabetes 05/21/2014  . Vitamin D deficiency 05/20/2014  . Medication management 05/20/2014  . CKD (chronic kidney disease), stage III  ([place]) 06/21/2013  . History of vasodepressor syncope   . Biventricular cardiac pacemaker in situ   . Mixed hyperlipidemia   . Hypertension   . LBBB   . Chronic systolic heart failure ([place])   . Dilated cardiomyopathy, nonischemic     Past Surgical History:  Procedure Laterality Date  . ABDOMINAL HYSTERECTOMY    . APPENDECTOMY    . BI-VENTRICULAR PACEMAKER UPGRADE N/A 05/05/2013   Procedure: BI-VENTRICULAR PACEMAKER UPGRADE;  Surgeon: Evans Lance, MD;  Location: [facility] CATH LAB;  Service: Cardiovascular;  Laterality: N/A;  . BIV PACEMAKER GENERATOR CHANGE OUT  05/05/2013   MDT CRTP upgrade (previously implanted dual chamber ICD) by Dr Lovena Le.  New RV/LV leads placed.   Marland Kitchen BREAST SURGERY Left 1985   Negative  Biopsy  . CARDIAC CATHETERIZATION     04/2003  . CARDIAC DEFIBRILLATOR PLACEMENT  09/2004   MDT ICD implanted for primary prevention; device upgrade to CRTP in 04-2013  . CARPAL TUNNEL RELEASE    . CHOLECYSTECTOMY     Allergies Nexium [esomeprazole magnesium]  Family History  Problem Relation Age of Onset  . Dementia Mother   . Diabetes Mother   . Heart Problems Father   . Heart attack Son   . Heart disease Son   . Heart disease Brother   . Heart attack Brother   . Cancer Maternal Grandmother        breast  . Stroke Daughter   .  Hypertension Daughter   . Hyperlipidemia Daughter   . Diabetes Daughter   . Diabetes Sister   . Hypertension Sister   . Hyperlipidemia Sister   . Hypertension Sister   . Hyperlipidemia Sister   . Cancer Sister        Breast  . Heart Problems Sister     Social History Social History   Tobacco Use  . Smoking status: Passive Smoke Exposure - Never Smoker  . Smokeless tobacco: Never Used  . Tobacco comment: worked at [employer] Topics  . Alcohol use: No    Alcohol/week: 0.0 standard drinks  . Drug use: No    Review of Systems  Constitutional: No fever/chills. Positive near syncope.  Eyes: No visual changes. ENT: No  sore throat. Cardiovascular: Denies chest pain. Respiratory: Denies shortness of breath. Gastrointestinal: No abdominal pain.  No nausea, no vomiting.  No diarrhea.  No constipation. Genitourinary: Negative for dysuria. Musculoskeletal: Negative for back pain. Skin: Negative for rash. Neurological: Negative for headaches, focal weakness or numbness.  10-point ROS otherwise negative.  ____________________________________________   PHYSICAL EXAM:  VITAL SIGNS: ED Triage Vitals  Enc Vitals Group     BP 10/03/18 1016 (!) 167/74     Pulse Rate 10/03/18 1016 64     Resp 10/03/18 1016 16     SpO2 10/03/18 1016 97 %     Pain Score 10/03/18 1010 0   Constitutional: Alert and oriented. Well appearing and in no acute distress. Eyes: Conjunctivae are normal.  Head: Atraumatic. Nose: No congestion/rhinnorhea. Mouth/Throat: Mucous membranes are moist.  Neck: No stridor.   Cardiovascular: Normal rate, regular rhythm. Good peripheral circulation. Grossly normal heart sounds.   Respiratory: Normal respiratory effort.  No retractions. Lungs CTAB. Gastrointestinal: Soft and nontender. No distention.  Musculoskeletal: No lower extremity tenderness nor edema. No gross deformities of extremities. Neurologic:  Normal speech and language. No gross focal neurologic deficits are appreciated.  Skin:  Skin is warm, dry and intact. No rash noted.  ____________________________________________   LABS (all labs ordered are listed, but only abnormal results are displayed)  Labs Reviewed  COMPREHENSIVE METABOLIC PANEL - Abnormal; Notable for the following components:      Result Value   Glucose, Bld 105 (*)    Creatinine, Ser 1.37 (*)    Total Protein 5.7 (*)    GFR calc non Af Amer 33 (*)    GFR calc Af Amer 38 (*)    All other components within normal limits  CBC WITH DIFFERENTIAL/PLATELET  TROPONIN I   ____________________________________________  EKG   EKG  Interpretation  Date/Time:  Saturday October 03 2018 10:33:31 EDT Ventricular Rate:  66 PR Interval:    QRS Duration: 134 QT Interval:  481 QTC Calculation: 504 R Axis:   -73 Text Interpretation:  Sinus rhythm Ventricular premature complex IVCD, consider atypical RBBB LVH with secondary repolarization abnormality Anterolateral infarct, age indeterminate No STEMI.  Confirmed by Nanda Quinton [phone]) on 10/03/2018 11:38:00 AM       ____________________________________________  RADIOLOGY  Dg Chest 2 View  Result Date: 10/03/2018 CLINICAL DATA:  84 year old female with near-syncope. EXAM: CHEST - 2 VIEW COMPARISON:  Chest radiographs 03/11/2018 and earlier. FINDINGS: Chronic cardiomegaly and low lung volumes. Stable left chest AICD. No pneumothorax, pulmonary edema, pleural effusion or confluent pulmonary opacity. No acute osseous abnormality identified. Negative visible bowel gas pattern. IMPRESSION: Stable.  No acute cardiopulmonary abnormality. Electronically Signed   By: Genevie Ann M.D.   On:  10/03/2018 12:15    ____________________________________________   PROCEDURES  Procedure(s) performed:   Procedures  None ____________________________________________   INITIAL IMPRESSION / ASSESSMENT AND PLAN / ED COURSE  Pertinent labs & imaging results that were available during my care of the patient were reviewed by me and considered in my medical decision making (see chart for details).  Patient presents to the emergency department for evaluation of near syncope event.  No description consistent with seizure.  Her EKG shows a paced rhythm which is similar to prior.  Went to interrogate her pacemaker and obtain screening labs.  Patient states this is happened to her multiple times and often improves with IV fluids.   Patient is feeling much better after IVF. Pacemaker interrogated with no acute events today. Plan for close PCP and Cardiology follow up. Labs with no acute findings.  CXR is stable from prior.   At this time, I do not feel there is any life-threatening condition present. I have reviewed and discussed all results (EKG, imaging, lab, urine as appropriate), exam findings with patient. I have reviewed nursing notes and appropriate previous records.  I feel the patient is safe to be discharged home without further emergent workup. Discussed usual and customary return precautions. Patient and family (if present) verbalize understanding and are comfortable with this plan.  Patient will follow-up with their primary care provider. If they do not have a primary care provider, information for follow-up has been provided to them. All questions have been answered.  ____________________________________________  FINAL CLINICAL IMPRESSION(S) / ED DIAGNOSES  Final diagnoses:  Near syncope     MEDICATIONS GIVEN DURING THIS VISIT:  Medications  sodium chloride 0.9 % bolus 500 mL (0 mLs Intravenous Stopped 10/03/18 1335)    Note:  This document was prepared using Dragon voice recognition software and may include unintentional dictation errors.  Nanda Quinton, MD Emergency Medicine    Masiah Woody, [facility], MD 10/03/18 [phone]

## 2018-10-03 NOTE — ED Triage Notes (Signed)
Pt arrives via [facility] from home, ems states patient was walking around when she began to feel weak so she sat down. Per pts family pt slumped over in the chair but she remained alert and verbal. Family also reports pt has hx of the same, received some iv fluids for previous episode and was discharged home. Pt a/ox4, resp e/u, nad. Vss: bp 140/80, HR 62, RR 16, O2 97%, CBG 179.

## 2018-10-03 NOTE — ED Notes (Signed)
ED Provider at bedside. 

## 2018-10-03 NOTE — ED Notes (Signed)
Patient has medtronic pacemaker in place, will interrogate per Dr. Hayden Pedro request

## 2018-10-03 NOTE — Discharge Instructions (Signed)

## 2018-10-08 ENCOUNTER — Other Ambulatory Visit: Payer: Self-pay | Admitting: *Deleted

## 2018-10-08 MED ORDER — CLOPIDOGREL BISULFATE 75 MG PO TABS: ORAL_TABLET | ORAL | 6 refills | Status: AC

## 2018-10-09 ENCOUNTER — Other Ambulatory Visit: Payer: Self-pay | Admitting: *Deleted

## 2018-10-09 MED ORDER — LOSARTAN POTASSIUM 50 MG PO TABS: ORAL_TABLET | ORAL | 1 refills | Status: DC

## 2018-10-14 ENCOUNTER — Encounter: Payer: Self-pay | Admitting: Neurology

## 2018-10-14 ENCOUNTER — Ambulatory Visit (INDEPENDENT_AMBULATORY_CARE_PROVIDER_SITE_OTHER): Payer: Medicare Other | Admitting: Neurology

## 2018-10-14 ENCOUNTER — Encounter

## 2018-10-14 VITALS — BP 150/66 | HR 59 | Ht 63.0 in | Wt 145.0 lb

## 2018-10-14 DIAGNOSIS — Z9189 Other specified personal risk factors, not elsewhere classified: Secondary | ICD-10-CM

## 2018-10-14 DIAGNOSIS — R569 Unspecified convulsions: Secondary | ICD-10-CM

## 2018-10-14 DIAGNOSIS — Z950 Presence of cardiac pacemaker: Secondary | ICD-10-CM | POA: Diagnosis not present

## 2018-10-14 DIAGNOSIS — I2510 Atherosclerotic heart disease of native coronary artery without angina pectoris: Secondary | ICD-10-CM | POA: Diagnosis not present

## 2018-10-14 MED ORDER — LEVETIRACETAM 250 MG PO TABS: 250.0000 mg | ORAL_TABLET | Freq: Two times a day (BID) | ORAL | 5 refills | Status: AC

## 2018-10-14 NOTE — Progress Notes (Signed)
Provider:  Larey Seat, M D  Primary Care Physician:  Unk Pinto, MD   Referring Provider: Unk Pinto, MD   Chief Complaint  Patient presents with  . Follow-up    pt with daughter, rm 13.pt here to review the EEG results and to find out or get a better understanding why she keeps passing out. end of aug/early sept. passed out in bathroom, came to and then passed out again. pt has a pacemaker. she has problems with being dehydrated but not all of the times that she passed out was she dehydrated. pt has these spells 2-3 times month. started a year ago, she can feel these spells coming on. she becomes weaks and has difficulty catching her breath and then collapses.  . Other    when she has the aura she will sit down or find somewhere to sit. so thankfully no injuries. when she passes out she also becomes incontinent or urine or bowel. she is out apx 3-8 min when she goes out. becomes clammy and ashey in color. she has no recollection of it happening when she goes out. goes to ED she gets fluids and is ok for longer length of time.     HPI:  [name] is a 84 y.o. female , seen here as in a referral  from Dr. Melford Aase for a review of abnormal MRI.   RV 10-14-2018 , [name] has worn a heart monitor for 30 days, and and has been followed for her pacemaker by Dr. Osie Cheeks at [facility].  She and her daughter believes that the monitor was worn in a time where she was actually spell free but since her passing out spells have increased to 2 to 3 months.  She has sometimes been dehydrated but not all the times that she has passed out.  She has an aura and can feel the spell coming on she feels suddenly very weak has difficulty catching her breath and then collapses.  She had thankfully no injuries mainly because the aura has allowed her to resume a position from where not to fall or injure herself.  She has no recollection of what happens when she goes out is not  aware of her surroundings but she has been witnessed to be clammy and very ashen in color.  She has become incontinent now of urine and bowel.  It takes 3 to 8 minutes before she is able to somewhat become aware of her surroundings again-  her eyes are sometimes open, sometimes closed when she passes out, her right arm is shaking. She has refused EMS to be called.  EEG was normal except for slowing.  I reviewed her current medications and lab results she is on aspirin, she takes Bumex only as needed for ankle edema reduction, she is on Coreg twice daily vitamin D Plavix, Estrace, Neurontin, Cozaar, magnesium gluconate, Zantac she has a past history of atrial fibrillation with bradycardia and for this reason received a biventricular cardiac pacemaker on 06 May 2013.  At the time she had already worn an AICD which was explanted and the new device implanted.  She has diabetes mellitus with excellent control and her HbA1c was 5.9.  Vitamin D at goal hypertension hyperlipidemia at goal, she has an occlusion and stenosis of the carotid artery without mentioning cerebral infarction, she has poor spell of transient global amnesia several years ago, she has by now had multiple spells of loss of consciousness, loss  of awareness and by description of these now frequent spells I am very suspicious that she may have a seizure.  I would like to add that she has chronic kidney disease stage III and that she had a history of syncope before I have admit her.   Last visit note: [name] is a very spirited 84 year old female patient, and has minimal concerns about cognition, balance and neurological function.  They have also not been any recent changes in medication. The patient had dizzy spells and since she has an implanted pacemaker defibrillator was unable to to undergo MRI procedures. The CT was performed without contrast on 06/23/2013 at [facility]. It was ordered by Dr. Vicie Mutters. There were no focal  cortical lesions, some remote lacunar infarcts are noted in the cerebellum the brainstem was intact. The basal ganglia are intact and moderate brain atrophy was described but given the patient's age this is not out of proportion. The CT was interpreted by Dr. San Morelle.  Chief complaint according to patient : "I wanted to know if the brain is all right. "   Review of Systems: Out of a complete 14 system review, the patient complains of only the following symptoms, and all other reviewed systems are negative. Possible TIA, near-syncope , almost fainting spells. Dizziness.  Social History   Socioeconomic History  . Marital status: Widowed    Spouse name: Not on file  . Number of children: 2  . Years of education: 50  . Highest education level: Not on file  Occupational History  . Not on file  Social Needs  . Financial resource strain: Not on file  . Food insecurity:    Worry: Not on file    Inability: Not on file  . Transportation needs:    Medical: Not on file    Non-medical: Not on file  Tobacco Use  . Smoking status: Passive Smoke Exposure - Never Smoker  . Smokeless tobacco: Never Used  . Tobacco comment: worked at [employer] and Sexual Activity  . Alcohol use: No    Alcohol/week: 0.0 standard drinks  . Drug use: No  . Sexual activity: Not on file  Lifestyle  . Physical activity:    Days per week: Not on file    Minutes per session: Not on file  . Stress: Not on file  Relationships  . Social connections:    Talks on phone: Not on file    Gets together: Not on file    Attends religious service: Not on file    Active member of club or organization: Not on file    Attends meetings of clubs or organizations: Not on file    Relationship status: Not on file  . Intimate partner violence:    Fear of current or ex partner: Not on file    Emotionally abused: Not on file    Physically abused: Not on file    Forced sexual activity: Not on file  Other  Topics Concern  . Not on file  Social History Narrative   Caffeine Daily average (3 cups am).  Retired.    Widowed.  6 KIDS.  Lives with daughter at her home.    Family History  Problem Relation Age of Onset  . Dementia Mother   . Diabetes Mother   . Heart Problems Father   . Heart attack Son   . Heart disease Son   . Heart disease Brother   . Heart attack Brother   .  Cancer Maternal Grandmother        breast  . Stroke Daughter   . Hypertension Daughter   . Hyperlipidemia Daughter   . Diabetes Daughter   . Diabetes Sister   . Hypertension Sister   . Hyperlipidemia Sister   . Hypertension Sister   . Hyperlipidemia Sister   . Cancer Sister        Breast  . Heart Problems Sister     Past Medical History:  Diagnosis Date  . A-fib ([place])   . Biventricular cardiac pacemaker in situ    05/06/13  Old AICD explanted and 6949 lead capped  RV lead  Medtronic [id number]  LV lead  Medtronic [id number]  [id number] V Medtronic biventricular pacer  PVX [id number]   . Diabetes mellitus without complication ([place])   . GERD (gastroesophageal reflux disease)   . History of duodenal ulcer   . History of esophageal stricture   . Hyperlipidemia   . Hypertension   . Occlusion and stenosis of carotid artery without mention of cerebral infarction   . Peripheral neuropathy   . Right groin pain 12/12/2014  . Secondary cardiomyopathy, unspecified   . Transient global amnesia     Past Surgical History:  Procedure Laterality Date  . ABDOMINAL HYSTERECTOMY    . APPENDECTOMY    . BI-VENTRICULAR PACEMAKER UPGRADE N/A 05/05/2013   Procedure: BI-VENTRICULAR PACEMAKER UPGRADE;  Surgeon: Evans Lance, MD;  Location: [facility] C[facility] LAB;  Service: Cardiovascular;  Laterality: N/A;  . BIV PACEMAKER GENERATOR CHANGE OUT  05/05/2013   MDT CRTP upgrade (previously implanted dual chamber ICD) by Dr Lovena Le.  New RV/LV leads placed.   Marland Kitchen BREAST SURGERY Left 1985   Negative  Biopsy  . CARDIAC CATHETERIZATION     04/2003  .  CARDIAC DEFIBRILLATOR PLACEMENT  09/2004   MDT ICD implanted for primary prevention; device upgrade to CRTP in 04-2013  . CARPAL TUNNEL RELEASE    . CHOLECYSTECTOMY      Current Outpatient Medications  Medication Sig Dispense Refill  . aspirin EC 81 MG tablet Take 81 mg by mouth daily.    Marland Kitchen b complex vitamins capsule Take 1 capsule by mouth daily.     . bumetanide (BUMEX) 1 MG tablet TAKE 1/2 TABLET BY MOUTH AS NEEDED FOR SWELLING (Patient taking differently: Take 0.5 mg by mouth daily as needed (Swelling). ) 45 tablet 1  . carvedilol (COREG) 6.25 MG tablet Take 6.25 mg by mouth 2 (two) times daily with a meal.    . Cholecalciferol (VITAMIN D) 2000 units CAPS Take 4,000 Units by mouth daily.     . clopidogrel (PLAVIX) 75 MG tablet TAKE 1 TABLET BY MOUTH EVERY DAY TO PREVENT HEART ATTACK 30 tablet 6  . gabapentin (NEURONTIN) 600 MG tablet TAKE 1 TABLET BY MOUTH EVERY EVENING AT BEDTIME (Patient taking differently: Take 600 mg by mouth every evening. ) 90 tablet 1  . losartan (COZAAR) 50 MG tablet Take 1 tablet 2 x/day  for blood pressure. 180 tablet 1  . magnesium gluconate (MAGONATE) 500 MG tablet Take 500 mg by mouth daily.     . ranitidine (ZANTAC) 300 MG tablet TAKE 1 TABLET TWICE A DAY FOR REFLUX (Patient taking differently: Take 300 mg by mouth 2 (two) times daily. ) 180 tablet 1   No current facility-administered medications for this visit.     Allergies as of 10/14/2018 - Review Complete 10/14/2018  Allergen Reaction Noted  . Nexium [esomeprazole magnesium] Diarrhea 10/16/2013  Vitals: BP (!) 150/66   Pulse (!) 59   Ht 5\' 3"  (1.6 m)   Wt 145 lb (65.8 kg)   BMI 25.69 kg/m  Last Weight:  Wt Readings from Last 1 Encounters:  10/14/18 145 lb (65.8 kg)   OXB:DZHG mass index is 25.69 kg/m.     Last Height:   Ht Readings from Last 1 Encounters:  10/14/18 5\' 3"  (1.6 m)    Physical exam:  General: The patient is awake, alert and appears not in acute distress. The patient  is well groomed. Head: Normocephalic, atraumatic. Neck is supple.  Cardiovascular:  Regular rate and rhythm, without  murmurs or carotid bruit, and without distended neck veins. Respiratory: Lungs are clear to auscultation. Skin:  Without evidence of edema, or rash Trunk: BMI is 25.5. The patient's posture is erect, slightly stooped.   Neurologic exam : The patient is awake and alert, oriented to place and time.    Attention span & concentration ability appears limited.  Speech is fluent,  without dysarthria, dysphonia or aphasia.  Mood and affect are appropriate, worried.  Cranial nerves: Pupils are equal and briskly reactive to light.  Extraocular movements  in vertical and horizontal planes intact and without nystagmus. Visual fields by finger perimetry are intact.Hearing to finger rub intact.  Facial sensation intact to fine touch. Facial motor strength is symmetric and tongue and uvula move midline. Shoulder shrug was symmetrical.   Motor exam:  Normal  muscle bulk and symmetric strength in all extremities. Sensory:  Fine touch, pinprick and vibration were tested in all extremities. Proprioception tested in the upper extremities was normal. Coordination: Rapid alternating movements in the fingers/hands was normal. Finger-to-nose maneuver  normal without evidence of ataxia, dysmetria or tremor. Gait and station: Patient walks without assistive device -Tandem gait is unfragmented. Turns with 3 Steps. Romberg testing is  negative. Deep tendon reflexes: in the  upper and lower extremities are symmetric and intact.   EEG recording from 02 September 2018 and composed a recording time of only 22 minutes 10 seconds, hyperventilation was therefore deferred and photic stimulation was performed.  She had a clearly slowed posterior dominant rhythm there was no photic entrainment at the usual frequencies between seat 7, 9, only at 18 Hz and 24 Hz frequencies versus some eyeblink artifact.  The patient  fell asleep after photic stimulation.  There was no spike wake based activity noted but some sleep-related EEG changes especially spindle formation.  The EKG remained in sinus rhythm with isolated PVCs.  I considered a slow posterior background part of an aging related static encephalopathy. Larey Seat, MD      CLINICAL DATA:  Dizziness. Syncopal episodes. Unable to get MRI due pacemaker.  EXAM: CT HEAD WITHOUT AND WITH CONTRAST TECHNIQUE: Contiguous axial images were obtained from the base of the skull through the vertex without and with intravenous contrast CONTRAST:  75mL ISOVUE-300 IOPAMIDOL (ISOVUE-300) INJECTION 61% COMPARISON:  CT head without contrast 06/23/2013  FINDINGS: Brain: Moderate atrophy and diffuse white matter disease is seen bilaterally. The basal ganglia are intact. No acute infarct, hemorrhage, or mass lesion is present. The insular ribbon is normal bilaterally. No focal cortical lesions are present. Remote lacunar infarcts are present in the cerebellum bilaterally. The brainstem is unremarkable.  Vascular: Atherosclerotic calcifications are present within the cavernous internal carotid artery is. There is no hyperdense vessel. Intracranial artery is enhance symmetrically.  Skull: The calvarium is intact. No focal lytic or blastic lesions are present.  Sinuses/Orbits: The paranasal sinuses and mastoid air cells are clear. Bilateral lens replacements are present. The globes and orbits are otherwise within normal limits.  IMPRESSION: 1. Moderate atrophy and diffuse white matter disease likely reflects the sequela of chronic microvascular ischemia. 2. No acute intracranial abnormality.   Electronically Signed   By: San Morelle M.D.   On: 07/16/2017 10:18  Danielle. Stubblefield reviewed her head  CT with me, and I was impressed how well she is clinically and cognitively doing. Her spells are likely syncopal, not TIAs. She has a pacemaker -  she now has an aura.  She has other " near- fainting- spells" that continue - proceeded by bending or kneeling. Vasomotor . Not longer driving.   Assessment:  After physical and neurologic examination, review of laboratory studies,  Personal review of imaging studies, reports of other /same  Imaging studies, results of polysomnography and / or neurophysiology testing and pre-existing records as far as provided in visit., my assessment is   1) I would ask the patient to keep hydrating well. Take an extra 12 seconds before you rise from a seated position. Orthostatic BP in the cardiology office was normal.   I spent more than 30 minutes of face to face time with the patient and her daughter .  Greater than 50% of time was spent in counseling and coordination of care. We have discussed the diagnosis and differential and I answered the patient's questions.    Plan:  Treatment plan and additional workup:  Neuropathy is treated with gabapentin.   Repeat 30 days cardiac monitor now that spells are so much more frequent- will  ask Dr, Lovena Le for help.   Repeat EEG , last EEG was slowed- this time prolonged over 45 minutes and with hyperventilation- she does have a pacemaker !!!.   I would consider starting Keppra just by her description of having seizure like spells. She has an aura-  She is out for 5-10 monutes - she grunts, breathing heavily, with gasping and puffing . She is postictally combative and confused.   Keppra is renal and hepatically friendly . I will start with 250 mg Keppra.   Larey Seat, MD 14/01/3952, 2:02 PM  Certified in Neurology by ABPN Certified in [place] by [facility] [address], [place], [place] [address]

## 2018-10-14 NOTE — Patient Instructions (Signed)
Levetiracetam tablets What is this medicine? LEVETIRACETAM (lee ve tye RA se tam) is an antiepileptic drug. It is used with other medicines to treat certain types of seizures. This medicine may be used for other purposes; ask your health care provider or pharmacist if you have questions. COMMON BRAND NAME(S): Keppra, Roweepra What should I tell my health care provider before I take this medicine? They need to know if you have any of these conditions: -kidney disease -suicidal thoughts, plans, or attempt; a previous suicide attempt by you or a family member -an unusual or allergic reaction to levetiracetam, other medicines, foods, dyes, or preservatives -pregnant or trying to get pregnant -breast-feeding How should I use this medicine? Take this medicine by mouth with a glass of water. Follow the directions on the prescription label. Swallow the tablets whole. Do not crush or chew this medicine. You may take this medicine with or without food. Take your doses at regular intervals. Do not take your medicine more often than directed. Do not stop taking this medicine or any of your seizure medicines unless instructed by your doctor or health care professional. Stopping your medicine suddenly can increase your seizures or their severity. A special MedGuide will be given to you by the pharmacist with each prescription and refill. Be sure to read this information carefully each time. Contact your pediatrician or health care professional regarding the use of this medication in children. While this drug may be prescribed for children as young as 4 years of age for selected conditions, precautions do apply. Overdosage: If you think you have taken too much of this medicine contact a poison control center or emergency room at once. NOTE: This medicine is only for you. Do not share this medicine with others. What if I miss a dose? If you miss a dose, take it as soon as you can. If it is almost time for your  next dose, take only that dose. Do not take double or extra doses. What may interact with this medicine? This medicine may interact with the following medications: -carbamazepine -colesevelam -probenecid -sevelamer This list may not describe all possible interactions. Give your health care provider a list of all the medicines, herbs, non-prescription drugs, or dietary supplements you use. Also tell them if you smoke, drink alcohol, or use illegal drugs. Some items may interact with your medicine. What should I watch for while using this medicine? Visit your doctor or health care professional for a regular check on your progress. Wear a medical identification bracelet or chain to say you have epilepsy, and carry a card that lists all your medications. It is important to take this medicine exactly as instructed by your health care professional. When first starting treatment, your dose may need to be adjusted. It may take weeks or months before your dose is stable. You should contact your doctor or health care professional if your seizures get worse or if you have any new types of seizures. You may get drowsy or dizzy. Do not drive, use machinery, or do anything that needs mental alertness until you know how this medicine affects you. Do not stand or sit up quickly, especially if you are an older patient. This reduces the risk of dizzy or fainting spells. Alcohol may interfere with the effect of this medicine. Avoid alcoholic drinks. The use of this medicine may increase the chance of suicidal thoughts or actions. Pay special attention to how you are responding while on this medicine. Any worsening of mood, or   thoughts of suicide or dying should be reported to your health care professional right away. Women who become pregnant while using this medicine may enroll in the [place] American Antiepileptic Drug Pregnancy Registry by calling 1-888-233-2334. This registry collects information about the safety of  antiepileptic drug use during pregnancy. What side effects may I notice from receiving this medicine? Side effects that you should report to your doctor or health care professional as soon as possible: -allergic reactions like skin rash, itching or hives, swelling of the face, lips, or tongue -breathing problems -dark urine -general ill feeling or flu-like symptoms -problems with balance, talking, walking -unusually weak or tired -worsening of mood, thoughts or actions of suicide or dying -yellowing of the eyes or skin Side effects that usually do not require medical attention (report to your doctor or health care professional if they continue or are bothersome): -diarrhea -dizzy, drowsy -headache -loss of appetite This list may not describe all possible side effects. Call your doctor for medical advice about side effects. You may report side effects to FDA at 1-800-FDA-1088. Where should I keep my medicine? Keep out of reach of children. Store at room temperature between 15 and 30 degrees C (59 and 86 degrees F). Throw away any unused medicine after the expiration date. NOTE: This sheet is a summary. It may not cover all possible information. If you have questions about this medicine, talk to your doctor, pharmacist, or health care provider.  2018 Elsevier/Gold Standard (2015-12-28 09:43:54)  

## 2018-10-26 ENCOUNTER — Telehealth: Payer: Self-pay

## 2018-10-26 DIAGNOSIS — R55 Syncope and collapse: Secondary | ICD-10-CM

## 2018-10-26 NOTE — Telephone Encounter (Signed)
-----   Message from Evans Lance, MD sent at 10/23/2018 10:22 PM EST ----- We can. GT ----- Message ----- From: Larey Seat, MD Sent: 10/14/2018   3:06 PM EST To: Unk Pinto, MD, Evans Lance, MD  Dr Lovena Le, Can we repeat a 30 days cardiac monitor ? patient's spells have become more frequent. Her memory is declining. I will repeat a prolonged EEG. Her last EEG was slow, CT  2018 showed atrophy.   Greetings, CD   FYI- Dr. Melford Aase.

## 2018-10-26 NOTE — Telephone Encounter (Signed)
Order entered for event monitor.  Messaged family via MyChart to let them know test had been ordered per request.

## 2018-10-28 ENCOUNTER — Telehealth: Payer: Self-pay

## 2018-10-28 ENCOUNTER — Ambulatory Visit: Payer: Medicare Other

## 2018-10-28 ENCOUNTER — Other Ambulatory Visit: Payer: Self-pay | Admitting: Internal Medicine

## 2018-10-28 ENCOUNTER — Ambulatory Visit (INDEPENDENT_AMBULATORY_CARE_PROVIDER_SITE_OTHER): Payer: Medicare Other

## 2018-10-28 DIAGNOSIS — R55 Syncope and collapse: Secondary | ICD-10-CM | POA: Diagnosis not present

## 2018-10-28 NOTE — Telephone Encounter (Signed)
Confirmed remote transmission w/ pt daughter.   

## 2018-10-29 ENCOUNTER — Encounter: Payer: Self-pay | Admitting: Cardiology

## 2018-10-29 DIAGNOSIS — F0391 Unspecified dementia with behavioral disturbance: Secondary | ICD-10-CM

## 2018-10-29 NOTE — Progress Notes (Unsigned)
MEDICARE ANNUAL WELLNESS VISIT AND FOLLOW UP  Assessment:   Diagnoses and all orders for this visit:  Chronic systolic heart failure (HCC)  Essential hypertension  ASHD (arteriosclerotic heart disease)  Gastroesophageal reflux disease, esophagitis presence not specified  CKD (chronic kidney disease), stage III (HCC)  Biventricular cardiac pacemaker in situ  Mixed hyperlipidemia  Vitamin D deficiency  Medication management  Prediabetes    Over 40 minutes of exam, counseling, chart review and critical decision making was performed Future Appointments  Date Time Provider [place]  10/30/2018 11:30 AM Garnet Sierras, NP [facility] None  11/09/2018 10:45 AM Trula Slade, DPM [facility]  11/11/2018  2:00 PM Dahlia Client, RPSGT [facility] None  12/18/2018  8:20 AM Nahser, Wonda Cheng, MD [facility]  02/17/2019  2:30 PM Dohmeier, Asencion Partridge, MD [facility] None  04/27/2019  9:00 AM Unk Pinto, MD [facility] None     Plan:   During the course of the visit the patient was educated and counseled about appropriate screening and preventive services including:    Pneumococcal vaccine   Prevnar 13  Influenza vaccine  Td vaccine  Screening electrocardiogram  Bone densitometry screening  Colorectal cancer screening  Diabetes screening  Glaucoma screening  Nutrition counseling   Advanced directives: requested   Subjective:  [name] is a 84 y.o. female who presents for Medicare Annual Wellness Visit and 3 month follow up. She has history of A-fib, HLD, HTN and ER visit approximatley one month ago for near syncope.  She has had elevated blood pressure for *** years. Her blood pressure {HAS HAS NOT:18834} been controlled at home, today their BP is   She {DOES_DOES WUJ:81191} workout. She denies chest pain, shortness of breath, dizziness.  She {ACTION; IS/IS YNW:29562130} on cholesterol medication and denies myalgias.  Her cholesterol {ACTION; IS/IS NOT:21021397} at goal. The cholesterol last visit was:   Lab Results  Component Value Date   CHOL 197 04/01/2018   HDL 53 04/01/2018   LDLCALC 118 (H) 04/01/2018   TRIG 147 04/01/2018   CHOLHDL 3.7 04/01/2018   She has had diabetes for *** years. She {Has/has not:18111} been working on diet and exercise for ***prediabetes, and denies {Symptoms; diabetes w/o none:19199}. Last A1C in the office was:  Lab Results  Component Value Date   HGBA1C 5.9 (H) 07/24/2018   Last GFR:   Lab Results  Component Value Date   GFRNONAA 33 (L) 10/03/2018   Lab Results  Component Value Date   GFRAA 38 (L) 10/03/2018   Patient is on Vitamin D supplement.   Lab Results  Component Value Date   VD25OH 71 08/28/2018      Medication Review: Current Outpatient Medications on File Prior to Visit  Medication Sig Dispense Refill  . aspirin EC 81 MG tablet Take 81 mg by mouth daily.    Marland Kitchen b complex vitamins capsule Take 1 capsule by mouth daily.     . bumetanide (BUMEX) 1 MG tablet TAKE 1/2 TABLET BY MOUTH AS NEEDED FOR SWELLING (Patient taking differently: Take 0.5 mg by mouth daily as needed (Swelling). ) 45 tablet 1  . carvedilol (COREG) 6.25 MG tablet Take 6.25 mg by mouth 2 (two) times daily with a meal.    . Cholecalciferol (VITAMIN D) 2000 units CAPS Take 4,000 Units by mouth daily.     . clopidogrel (PLAVIX) 75 MG tablet TAKE 1 TABLET BY MOUTH EVERY DAY TO PREVENT HEART ATTACK 30 tablet 6  . gabapentin (NEURONTIN) 600 MG  tablet TAKE 1 TABLET BY MOUTH EVERY EVENING AT BEDTIME (Patient taking differently: Take 600 mg by mouth every evening. ) 90 tablet 1  . levETIRAcetam (KEPPRA) 250 MG tablet Take 1 tablet (250 mg total) by mouth 2 (two) times daily. 60 tablet 5  . losartan (COZAAR) 50 MG tablet Take 1 tablet 2 x/day  for blood pressure. 180 tablet 1  . magnesium gluconate (MAGONATE) 500 MG tablet Take 500 mg by mouth daily.     . ranitidine (ZANTAC) 300 MG tablet TAKE  1 TABLET TWICE A DAY FOR REFLUX (Patient taking differently: Take 300 mg by mouth 2 (two) times daily. ) 180 tablet 1   No current facility-administered medications on file prior to visit.     Allergies  Allergen Reactions  . Nexium [Esomeprazole Magnesium] Diarrhea    Current Problems (verified) Patient Active Problem List   Diagnosis Date Noted  . Syncope and collapse 09/09/2018  . ASHD (arteriosclerotic heart disease) 12/05/2014  . GERD (gastroesophageal reflux disease) 12/05/2014  . Prediabetes 05/21/2014  . Vitamin D deficiency 05/20/2014  . Medication management 05/20/2014  . CKD (chronic kidney disease), stage III ([place]) 06/21/2013  . History of vasodepressor syncope   . Biventricular cardiac pacemaker in situ   . Mixed hyperlipidemia   . Hypertension   . LBBB   . Chronic systolic heart failure ([place])   . Dilated cardiomyopathy, nonischemic     Screening Tests Immunization History  Administered Date(s) Administered  . DT 10/03/2015  . Influenza, High Dose Seasonal PF 10/19/2013, 08/06/2016, 08/28/2018  . Influenza-Unspecified 10/08/2012, 02/03/2015  . Pneumococcal Conjugate-13 08/31/2014  . Pneumococcal-Unspecified 12/09/2002  . Td 12/09/2002  . Zoster 08/06/2016    Preventative care: Last colonoscopy: 2012, no further Last mammogram: *** Last pap smear/pelvic exam: N/A  DEXA: 2019 CXR: 2019  Prior vaccinations: TD or Tdap: 2016  Influenza: DUE Pneumococcal: 2004 Prevnar13: 2015 Shingles/Zostavax: 2017  Names of Other Physician/Practitioners you currently use: 1. [facility] here for primary care 2. ***, eye doctor, last visit *** 3. ***, dentist, last visit *** Patient Care Team: Unk Pinto, MD as PCP - General (Internal Medicine) Unk Pinto, MD (Internal Medicine) Jacolyn Reedy, MD as Consulting Physician (Cardiology) Regal, Tamala Fothergill, DPM as Consulting Physician (Podiatry) Dohmeier, Asencion Partridge, MD  as Consulting Physician (Neurology) G. [facility] Mould II (Ophthalmology) Magnus Ivan. Adams, DDS  SURGICAL HISTORY She  has a past surgical history that includes Cardiac defibrillator placement (09/2004); Cardiac catheterization; Appendectomy; Abdominal hysterectomy; Biv pacemaker generator change out (05/05/2013); Carpal tunnel release; Cholecystectomy; Breast surgery (Left, 1985); and bi-ventricular pacemaker upgrade (N/A, 05/05/2013). FAMILY HISTORY Her family history includes Cancer in her maternal grandmother and sister; Dementia in her mother; Diabetes in her daughter, mother, and sister; Heart Problems in her father and sister; Heart attack in her brother and son; Heart disease in her brother and son; Hyperlipidemia in her daughter, sister, and sister; Hypertension in her daughter, sister, and sister; Stroke in her daughter. SOCIAL HISTORY She  reports that she is a non-smoker but has been exposed to tobacco smoke. She has never used smokeless tobacco. She reports that she does not drink alcohol or use drugs.   MEDICARE WELLNESS OBJECTIVES: Physical activity:   Cardiac risk factors:   Depression/mood screen:   Depression screen [facility] 2/9 08/29/2018  Decreased Interest 0  Down, Depressed, Hopeless 0  PHQ - 2 Score 0    ADLs:  In your present state of health, do you have any  difficulty performing the following activities: 08/29/2018 04/01/2018  Hearing? N N  Comment - -  Vision? N N  Difficulty concentrating or making decisions? Y N  Comment poor short term recall -  Walking or climbing stairs? N N  Dressing or bathing? Y N  Comment has home health aide to assist w/ADL's -  Doing errands, shopping? Y N  Comment Homehealt aid transports for appt's -  Conservation officer, nature and eating ? - -  Using the Toilet? - -  In the past six months, have you accidently leaked urine? - -  Do you have problems with loss of bowel control? - -  Managing your Medications? - -  Managing your Finances? - -   Housekeeping or managing your Housekeeping? - -  Some recent data might be hidden     Cognitive Testing  Alert? Yes  Normal Appearance?Yes  Oriented to person? Yes  Place? Yes   Time? Yes  Recall of three objects?  Yes  Can perform simple calculations? Yes  Displays appropriate judgment?Yes  Can read the correct time from a watch face?Yes  EOL planning:    ROS   Objective:     There were no vitals filed for this visit. There is no height or weight on file to calculate BMI.  General appearance: alert, no distress, WD/WN, female HEENT: normocephalic, sclerae anicteric, TMs pearly, nares patent, no discharge or erythema, pharynx normal Oral cavity: MMM, no lesions Neck: supple, no lymphadenopathy, no thyromegaly, no masses Heart: RRR, normal S1, S2, no murmurs Lungs: CTA bilaterally, no wheezes, rhonchi, or rales Abdomen: +bs, soft, non tender, non distended, no masses, no hepatomegaly, no splenomegaly Musculoskeletal: nontender, no swelling, no obvious deformity Extremities: no edema, no cyanosis, no clubbing Pulses: 2+ symmetric, upper and lower extremities, normal cap refill Neurological: alert, oriented x 3, CN2-12 intact, strength normal upper extremities and lower extremities, sensation normal throughout, DTRs 2+ throughout, no cerebellar signs, gait normal Psychiatric: normal affect, behavior normal, pleasant   Medicare Attestation I have personally reviewed: The patient's medical and social history Their use of alcohol, tobacco or illicit drugs Their current medications and supplements The patient's functional ability including ADLs,fall risks, home safety risks, cognitive, and hearing and visual impairment Diet and physical activities Evidence for depression or mood disorders  The patient's weight, height, BMI, and visual acuity have been recorded in the chart.  I have made referrals, counseling, and provided education to the patient based on review of the above  and I have provided the patient with a written personalized care plan for preventive services.     Garnet Sierras, NP   10/29/2018

## 2018-10-29 NOTE — Progress Notes (Signed)
Opened in error

## 2018-10-30 ENCOUNTER — Ambulatory Visit: Payer: Medicare Other | Admitting: Adult Health Nurse Practitioner

## 2018-10-30 DIAGNOSIS — I5022 Chronic systolic (congestive) heart failure: Secondary | ICD-10-CM

## 2018-10-30 DIAGNOSIS — I2510 Atherosclerotic heart disease of native coronary artery without angina pectoris: Secondary | ICD-10-CM

## 2018-10-30 DIAGNOSIS — K219 Gastro-esophageal reflux disease without esophagitis: Secondary | ICD-10-CM

## 2018-10-30 DIAGNOSIS — R7303 Prediabetes: Secondary | ICD-10-CM

## 2018-10-30 DIAGNOSIS — N183 Chronic kidney disease, stage 3 unspecified: Secondary | ICD-10-CM

## 2018-10-30 DIAGNOSIS — R7309 Other abnormal glucose: Secondary | ICD-10-CM

## 2018-10-30 DIAGNOSIS — E559 Vitamin D deficiency, unspecified: Secondary | ICD-10-CM

## 2018-10-30 DIAGNOSIS — Z950 Presence of cardiac pacemaker: Secondary | ICD-10-CM

## 2018-10-30 DIAGNOSIS — Z79899 Other long term (current) drug therapy: Secondary | ICD-10-CM

## 2018-10-30 DIAGNOSIS — I10 Essential (primary) hypertension: Secondary | ICD-10-CM

## 2018-10-30 DIAGNOSIS — E782 Mixed hyperlipidemia: Secondary | ICD-10-CM

## 2018-11-03 ENCOUNTER — Ambulatory Visit (INDEPENDENT_AMBULATORY_CARE_PROVIDER_SITE_OTHER): Payer: Medicare Other

## 2018-11-03 DIAGNOSIS — I420 Dilated cardiomyopathy: Secondary | ICD-10-CM | POA: Diagnosis not present

## 2018-11-04 NOTE — Progress Notes (Signed)
Remote pacemaker transmission.   

## 2018-11-09 ENCOUNTER — Encounter: Payer: Self-pay | Admitting: Podiatry

## 2018-11-09 ENCOUNTER — Telehealth: Payer: Self-pay | Admitting: Internal Medicine

## 2018-11-09 ENCOUNTER — Ambulatory Visit (INDEPENDENT_AMBULATORY_CARE_PROVIDER_SITE_OTHER): Payer: Medicare Other | Admitting: Podiatry

## 2018-11-09 ENCOUNTER — Other Ambulatory Visit: Payer: Self-pay | Admitting: *Deleted

## 2018-11-09 DIAGNOSIS — B351 Tinea unguium: Secondary | ICD-10-CM

## 2018-11-09 DIAGNOSIS — M79676 Pain in unspecified toe(s): Secondary | ICD-10-CM | POA: Diagnosis not present

## 2018-11-09 MED ORDER — LOSARTAN POTASSIUM 50 MG PO TABS: ORAL_TABLET | ORAL | 1 refills | Status: DC

## 2018-11-09 MED ORDER — CARVEDILOL 6.25 MG PO TABS: 6.2500 mg | ORAL_TABLET | Freq: Two times a day (BID) | ORAL | 1 refills | Status: DC

## 2018-11-09 NOTE — Progress Notes (Deleted)
FOLLOW UP  Assessment and Plan:   CHF Weights stable; no concerning symptoms; monitor Followed by cardiology  Hypertension Maintaining higher values due to recent syncopal episodes Monitor blood pressure at home; patient to call if consistently greater than 150/90 Continue DASH diet.   Reminder to go to the ER if any CP, SOB, nausea, dizziness, severe HA, changes vision/speech, left arm numbness and tingling and jaw pain.  Cholesterol Currently mildly above goal; will not treat aggressively secondary to age Continue low cholesterol diet and exercise.  Defer lipid panel.   Prediabetes Continue diet and exercise.  Perform daily foot/skin check, notify office of any concerning changes.  Check A1C  BMI 25 Continue to recommend diet heavy in fruits and veggies and low in animal meats, cheeses, and dairy products, appropriate calorie intake Discuss exercise recommendations routinely Continue to monitor weight at each visit  Vitamin D Def At goal at last visit; continue supplementation Defer Vit D level  Syncope/? Seizures Continue close follow up with Dr. Brett Fairy, Dr. Lovena Le Check keppra levels today No episodes in past month ***   Continue diet and meds as discussed. Further disposition pending results of labs. Discussed med's effects and SE's.   Over 30 minutes of exam, counseling, chart review, and critical decision making was performed.   Future Appointments  Date Time Provider [place]  11/09/2018 10:45 AM Trula Slade, DPM [facility]  11/11/2018 11:00 AM Liane Comber, NP [facility] None  11/11/2018  2:00 PM Dahlia Client, RPSGT [facility] None  12/18/2018  8:20 AM Nahser, Wonda Cheng, MD [facility]  02/02/2019 12:25 PM [facility] DEVICE REMOTES [facility]  02/17/2019  2:30 PM Dohmeier, Asencion Partridge, MD [facility] None  04/27/2019  9:00 AM Unk Pinto, MD [facility] None     ----------------------------------------------------------------------------------------------------------------------  HPI 84 y.o. female  presents for 3 month follow up on hypertension, cholesterol, prediabetes, weight and vitamin D deficiency.   Patient had a PPM planted in 2004 and then replaced with an AICD/defib at age 21 in 2008. She had a negative Stress Myoview in 2010. EF 25% in 2014. Followed by Dr. Lovena Le.  She has had multiple near syncopal episodes this year resulting in evaluation at ED; each without injury, seizure activity, PPM without incident, EKG, labs, CXR without acute findings. She has recovered each time with IVF and discharged *** Diuretic has been decreased to bumex 0.5 mg PRN for swelling only with goal to maintain higher BP. She was also evaluated by neuro Dr. Brett Fairy who was concerned for possible seizure, EEG pending and started keppra 250 mg.   BMI is There is no height or weight on file to calculate BMI., she has not been working on diet and exercise.  She has a history of Systolic CHF, denies orthopnea, paroxysmal nocturnal dyspnea and edema. Positive for palpitations and exertional dyspnea. Wt Readings from Last 3 Encounters:  10/14/18 145 lb (65.8 kg)  08/28/18 147 lb 12.8 oz (67 kg)  08/05/18 144 lb (65.3 kg)   Her blood pressure has been controlled at home (log demonstrates 130-140s/70-80s), today their BP is    She does not workout. She denies chest pain, shortness of breath, dizziness.    She is not on cholesterol medication secondary to age. Her cholesterol is not at goal. The cholesterol last visit was:   Lab Results  Component Value Date   CHOL 197 04/01/2018   HDL 53 04/01/2018   LDLCALC 118 (H) 04/01/2018   TRIG 147 04/01/2018   CHOLHDL 3.7  04/01/2018    She has been working on diet and exercise for prediabetes, and denies foot ulcerations, increased appetite, nausea, paresthesia of the feet, polydipsia, polyuria, visual disturbances,  vomiting and weight loss. Last A1C in the office was:  Lab Results  Component Value Date   HGBA1C 5.9 (H) 07/24/2018   Patient is on Vitamin D supplement.   Lab Results  Component Value Date   VD25OH 71 08/28/2018        Current Medications:  Current Outpatient Medications on File Prior to Visit  Medication Sig  . aspirin EC 81 MG tablet Take 81 mg by mouth daily.  Marland Kitchen b complex vitamins capsule Take 1 capsule by mouth daily.   . bumetanide (BUMEX) 1 MG tablet TAKE 1/2 TABLET BY MOUTH AS NEEDED FOR SWELLING (Patient taking differently: Take 0.5 mg by mouth daily as needed (Swelling). )  . carvedilol (COREG) 6.25 MG tablet Take 6.25 mg by mouth 2 (two) times daily with a meal.  . Cholecalciferol (VITAMIN D) 2000 units CAPS Take 4,000 Units by mouth daily.   . clopidogrel (PLAVIX) 75 MG tablet TAKE 1 TABLET BY MOUTH EVERY DAY TO PREVENT HEART ATTACK  . gabapentin (NEURONTIN) 600 MG tablet TAKE 1 TABLET BY MOUTH EVERY EVENING AT BEDTIME (Patient taking differently: Take 600 mg by mouth every evening. )  . levETIRAcetam (KEPPRA) 250 MG tablet Take 1 tablet (250 mg total) by mouth 2 (two) times daily.  Marland Kitchen losartan (COZAAR) 50 MG tablet Take 1 tablet 2 x/day  for blood pressure.  . magnesium gluconate (MAGONATE) 500 MG tablet Take 500 mg by mouth daily.   . ranitidine (ZANTAC) 300 MG tablet TAKE 1 TABLET TWICE A DAY FOR REFLUX (Patient taking differently: Take 300 mg by mouth 2 (two) times daily. )   No current facility-administered medications on file prior to visit.      Allergies:  Allergies  Allergen Reactions  . Nexium [Esomeprazole Magnesium] Diarrhea     Medical History:  Past Medical History:  Diagnosis Date  . A-fib ([place])   . Biventricular cardiac pacemaker in situ    05/06/13  Old AICD explanted and 6949 lead capped  RV lead  Medtronic 5076 [id number]  LV lead  Medtronic 4194  [id number] V Medtronic biventricular pacer  PVX [id number]   . Diabetes mellitus without complication  ([place])   . GERD (gastroesophageal reflux disease)   . History of duodenal ulcer   . History of esophageal stricture   . Hyperlipidemia   . Hypertension   . Occlusion and stenosis of carotid artery without mention of cerebral infarction   . Peripheral neuropathy   . Right groin pain 12/12/2014  . Secondary cardiomyopathy, unspecified   . Transient global amnesia    Family history- Reviewed and unchanged Social history- Reviewed and unchanged   Review of Systems:  Review of Systems  Constitutional: Negative for malaise/fatigue and weight loss.  HENT: Negative for ear discharge, ear pain, hearing loss (decreased hearing) and tinnitus.   Eyes: Negative for blurred vision and double vision.  Respiratory: Negative for cough, shortness of breath and wheezing.   Cardiovascular: Negative for chest pain, palpitations, orthopnea, claudication and leg swelling.  Gastrointestinal: Negative for abdominal pain, blood in stool, constipation, diarrhea, heartburn, melena, nausea and vomiting.  Genitourinary: Negative.   Musculoskeletal: Positive for falls (near syncope). Negative for joint pain and myalgias.  Skin: Negative for rash.  Neurological: Negative for dizziness, tingling, sensory change, weakness and headaches.  Endo/Heme/Allergies: Negative for polydipsia.  Psychiatric/Behavioral: Negative.   All other systems reviewed and are negative.    Physical Exam: There were no vitals taken for this visit. Wt Readings from Last 3 Encounters:  10/14/18 145 lb (65.8 kg)  08/28/18 147 lb 12.8 oz (67 kg)  08/05/18 144 lb (65.3 kg)   General Appearance: Well nourished, in no apparent distress. Eyes: PERRLA, EOMs, conjunctiva no swelling or erythema Sinuses: No Frontal/maxillary tenderness ENT/Mouth: Bilateral ears obstructed by wax. No erythema, swelling, or exudate on post pharynx.  Tonsils not swollen or erythematous. Hearing normal.  Neck: Supple, thyroid normal.  Respiratory: Respiratory  effort normal, BS equal bilaterally without rales, rhonchi, wheezing or stridor.  Cardio: RRR with no MRGs. Brisk peripheral pulses without edema.  Abdomen: Soft, + BS.  Non tender, no guarding, rebound, hernias, masses. Lymphatics: Non tender without lymphadenopathy.  Musculoskeletal: Full ROM, 5/5 strength, slow steady gait Skin: Warm, dry without rashes, lesions, ecchymosis.  Neuro: Cranial nerves intact. No cerebellar symptoms.  Psych: Awake and oriented X 3, normal affect, Insight and Judgment appropriate.    Izora Ribas, NP 8:21 AM [facility]

## 2018-11-09 NOTE — Telephone Encounter (Signed)
° ° ° °  1. Has your device fired? NO  2. Is you device beeping? NO  3. Are you experiencing draining or swelling at device site? NO  4. Are you calling to see if we received your device transmission? YES  5. Have you passed out? NO      Please route to Device Clinic Pool  

## 2018-11-10 ENCOUNTER — Telehealth: Payer: Self-pay | Admitting: Neurology

## 2018-11-10 ENCOUNTER — Telehealth: Payer: Self-pay | Admitting: Internal Medicine

## 2018-11-10 NOTE — Telephone Encounter (Signed)
My Lord, why didn't she use the emergency help when it was right there?

## 2018-11-10 NOTE — Progress Notes (Signed)
Subjective: 84  y.o. returns the office today for painful, elongated, thickened toenails which she cannot trim herself. Denies any redness or drainage around the nails. Denies any acute changes since last appointment and no new complaints today. Denies any systemic complaints such as fevers, chills, nausea, vomiting.   Objective: AAO 3, NAD- presents with daughter DP/PT pulses palpable, CRT less than 3 seconds Nails hypertrophic, dystrophic, elongated, brittle, discolored 10. There is tenderness overlying the nails 1-5 bilaterally with the right hallux the worst. There is no surrounding erythema or drainage along the nail sites. No open lesions or pre-ulcerative lesions are identified. No pain with calf compression, swelling, warmth, erythema.  Assessment: Patient presents with symptomatic onychomycosis  Plan: -Treatment options including alternatives, risks, complications were discussed -Nails sharply debrided 10 without complication/bleeding.  -Discussed daily foot inspection. If there are any changes, to call the office immediately.  -Follow-up in 3 months or sooner if any problems are to arise. In the meantime, encouraged to call the office with any questions, concerns, changes symptoms.  Celesta Gentile, DPM

## 2018-11-10 NOTE — Telephone Encounter (Signed)
Called to inform patient that her remote transmission was received. Patient verbalized understanding and appreciation of information.

## 2018-11-10 NOTE — Telephone Encounter (Signed)
I received a call from pt's dtr., Izora Gala. She reports that the [facility] responded to pt's home, but that dtr. told them pt. is not currently in an emergency situation--that "in the past" pt. has been found with knives and locked family out of the home. She sts. that pt. has expressed the desire to hurt herself and wants to discuss starting pt. on an antidepressant. I explained that it takes 6-8 weeks for an antidepressant to be effective; given that pt. currently expresses SI, she should be taken to the ER tonight for eval. Izora Gala verbalized understanding of same/fim

## 2018-11-10 NOTE — Telephone Encounter (Signed)
° ° °  Patient calling with questions about wearing monitor.

## 2018-11-10 NOTE — Telephone Encounter (Signed)
Thank you , Faith. This is very concerning.

## 2018-11-10 NOTE — Telephone Encounter (Signed)
Patient's daughter Izora Gala (on [place]) sates patient is depressed, threatened to take her own life, found butcher knife in her bedroom, locking others out of her house, mean and vindictive. Please call and discuss. Patient has an EEG scheduled for tomorrow.

## 2018-11-10 NOTE — Telephone Encounter (Signed)
I called [name] and was speaking with her--it sounded as if we had a bad connection.  She confirmed that her mother currently has locked herself in her house and has a knife. I was in the middle of explaining that she should call 911 and let officers know that pt. is depressed, armed with a knife, has locked family out of the  home, and officers can help her safely transport pt. to the ER for psych eval to determine if pt. needs hospitalization.  Our call was disconnected at some point. I called [name] back but reached her vm--I left a detailed message with the above information. As I could not reach the dtr, I called the [facility] and reported this incident and requested that officers respond and take the pt. to the ER for psych eval/fim

## 2018-11-11 ENCOUNTER — Encounter (HOSPITAL_COMMUNITY): Payer: Self-pay | Admitting: Emergency Medicine

## 2018-11-11 ENCOUNTER — Emergency Department (HOSPITAL_COMMUNITY): Payer: Medicare Other

## 2018-11-11 ENCOUNTER — Other Ambulatory Visit: Payer: Medicare Other

## 2018-11-11 ENCOUNTER — Telehealth: Payer: Self-pay | Admitting: Neurology

## 2018-11-11 ENCOUNTER — Ambulatory Visit: Payer: Self-pay | Admitting: Adult Health

## 2018-11-11 ENCOUNTER — Emergency Department (HOSPITAL_COMMUNITY)
Admission: EM | Admit: 2018-11-11 | Discharge: 2018-11-12 | Disposition: A | Discharge status: Other Acute Inpatient Hospital | Payer: Medicare Other | Attending: Emergency Medicine | Admitting: Emergency Medicine

## 2018-11-11 DIAGNOSIS — Z7722 Contact with and (suspected) exposure to environmental tobacco smoke (acute) (chronic): Secondary | ICD-10-CM | POA: Insufficient documentation

## 2018-11-11 DIAGNOSIS — N183 Chronic kidney disease, stage 3 (moderate): Secondary | ICD-10-CM | POA: Insufficient documentation

## 2018-11-11 DIAGNOSIS — R45851 Suicidal ideations: Secondary | ICD-10-CM | POA: Diagnosis not present

## 2018-11-11 DIAGNOSIS — R55 Syncope and collapse: Secondary | ICD-10-CM | POA: Diagnosis not present

## 2018-11-11 DIAGNOSIS — Z7982 Long term (current) use of aspirin: Secondary | ICD-10-CM | POA: Diagnosis not present

## 2018-11-11 DIAGNOSIS — Z79899 Other long term (current) drug therapy: Secondary | ICD-10-CM | POA: Diagnosis not present

## 2018-11-11 DIAGNOSIS — I130 Hypertensive heart and chronic kidney disease with heart failure and stage 1 through stage 4 chronic kidney disease, or unspecified chronic kidney disease: Secondary | ICD-10-CM | POA: Diagnosis not present

## 2018-11-11 DIAGNOSIS — R531 Weakness: Secondary | ICD-10-CM

## 2018-11-11 DIAGNOSIS — I5022 Chronic systolic (congestive) heart failure: Secondary | ICD-10-CM | POA: Insufficient documentation

## 2018-11-11 DIAGNOSIS — E119 Type 2 diabetes mellitus without complications: Secondary | ICD-10-CM | POA: Insufficient documentation

## 2018-11-11 DIAGNOSIS — J9811 Atelectasis: Secondary | ICD-10-CM | POA: Diagnosis not present

## 2018-11-11 LAB — CBC WITH DIFFERENTIAL/PLATELET
Abs Immature Granulocytes: 0.06 10*3/uL (ref 0.00–0.07)
BASOS ABS: 0.1 10*3/uL (ref 0.0–0.1)
Basophils Relative: 1 %
EOS ABS: 0.3 10*3/uL (ref 0.0–0.5)
EOS PCT: 2 %
HEMATOCRIT: 45.1 % (ref 36.0–46.0)
HEMOGLOBIN: 14.2 g/dL (ref 12.0–15.0)
IMMATURE GRANULOCYTES: 1 %
LYMPHS ABS: 1.9 10*3/uL (ref 0.7–4.0)
Lymphocytes Relative: 15 %
MCH: 29.2 pg (ref 26.0–34.0)
MCHC: 31.5 g/dL (ref 30.0–36.0)
MCV: 92.8 fL (ref 80.0–100.0)
Monocytes Absolute: 0.8 10*3/uL (ref 0.1–1.0)
Monocytes Relative: 6 %
Neutro Abs: 10 10*3/uL — ABNORMAL HIGH (ref 1.7–7.7)
Neutrophils Relative %: 75 %
Platelets: 247 10*3/uL (ref 150–400)
RBC: 4.86 MIL/uL (ref 3.87–5.11)
RDW: 11.9 % (ref 11.5–15.5)
WBC: 13.2 10*3/uL — ABNORMAL HIGH (ref 4.0–10.5)
nRBC: 0 % (ref 0.0–0.2)

## 2018-11-11 LAB — BASIC METABOLIC PANEL
Anion gap: 12 (ref 5–15)
BUN: 17 mg/dL (ref 8–23)
CALCIUM: 9.7 mg/dL (ref 8.9–10.3)
CO2: 25 mmol/L (ref 22–32)
CREATININE: 1.46 mg/dL — AB (ref 0.44–1.00)
Chloride: 106 mmol/L (ref 98–111)
GFR calc Af Amer: 36 mL/min — ABNORMAL LOW (ref >60)
GFR calc non Af Amer: 31 mL/min — ABNORMAL LOW (ref >60)
GLUCOSE: 177 mg/dL — AB (ref 70–99)
Potassium: 4 mmol/L (ref 3.5–5.1)
Sodium: 143 mmol/L (ref 135–145)

## 2018-11-11 LAB — URINALYSIS, ROUTINE W REFLEX MICROSCOPIC
BILIRUBIN URINE: NEGATIVE
Glucose, UA: NEGATIVE mg/dL
HGB URINE DIPSTICK: NEGATIVE
Ketones, ur: 5 mg/dL — AB
NITRITE: POSITIVE — AB
PROTEIN: NEGATIVE mg/dL
SPECIFIC GRAVITY, URINE: 1.01 (ref 1.005–1.030)
pH: 6 (ref 5.0–8.0)

## 2018-11-11 MED ORDER — MAGNESIUM GLUCONATE 500 MG PO TABS: 500.0000 mg | ORAL_TABLET | Freq: Every day | ORAL | Status: DC

## 2018-11-11 MED ORDER — FAMOTIDINE 20 MG PO TABS
20.0000 mg | ORAL_TABLET | Freq: Every day | ORAL | Status: DC
  Administered 2018-11-11: 20 mg via ORAL
  Filled 2018-11-11: qty 1

## 2018-11-11 MED ORDER — CLOPIDOGREL BISULFATE 75 MG PO TABS
75.0000 mg | ORAL_TABLET | Freq: Every day | ORAL | Status: DC
  Administered 2018-11-11: 75 mg via ORAL
  Filled 2018-11-11: qty 1

## 2018-11-11 MED ORDER — ASPIRIN EC 81 MG PO TBEC
81.0000 mg | DELAYED_RELEASE_TABLET | Freq: Every day | ORAL | Status: DC
  Administered 2018-11-11: 81 mg via ORAL
  Filled 2018-11-11: qty 1

## 2018-11-11 MED ORDER — CARVEDILOL 12.5 MG PO TABS
6.2500 mg | ORAL_TABLET | Freq: Two times a day (BID) | ORAL | Status: DC
  Administered 2018-11-11 – 2018-11-12 (×3): 6.25 mg via ORAL
  Filled 2018-11-11 (×3): qty 1

## 2018-11-11 MED ORDER — MAGNESIUM GLUCONATE 500 MG PO TABS
500.0000 mg | ORAL_TABLET | Freq: Every day | ORAL | Status: DC
  Administered 2018-11-11: 500 mg via ORAL
  Filled 2018-11-11 (×2): qty 1

## 2018-11-11 MED ORDER — SODIUM CHLORIDE 0.9 % IV BOLUS
500.0000 mL | Freq: Once | INTRAVENOUS | Status: AC
  Administered 2018-11-11: 500 mL via INTRAVENOUS

## 2018-11-11 MED ORDER — BUMETANIDE 0.5 MG PO TABS
0.5000 mg | ORAL_TABLET | Freq: Every day | ORAL | Status: DC | PRN
  Filled 2018-11-11: qty 1

## 2018-11-11 MED ORDER — B COMPLEX-C PO TABS
1.0000 | ORAL_TABLET | Freq: Every day | ORAL | Status: DC
  Administered 2018-11-12: 1 via ORAL
  Filled 2018-11-11: qty 1

## 2018-11-11 MED ORDER — LOSARTAN POTASSIUM 50 MG PO TABS
50.0000 mg | ORAL_TABLET | Freq: Two times a day (BID) | ORAL | Status: DC
  Administered 2018-11-11 – 2018-11-12 (×2): 50 mg via ORAL
  Filled 2018-11-11 (×2): qty 1

## 2018-11-11 MED ORDER — GABAPENTIN 600 MG PO TABS
600.0000 mg | ORAL_TABLET | Freq: Every evening | ORAL | Status: DC
  Administered 2018-11-11 – 2018-11-12 (×2): 600 mg via ORAL
  Filled 2018-11-11 (×2): qty 1

## 2018-11-11 MED ORDER — VITAMIN D 25 MCG (1000 UNIT) PO TABS
4000.0000 [IU] | ORAL_TABLET | Freq: Every day | ORAL | Status: DC
  Administered 2018-11-12: 4000 [IU] via ORAL
  Filled 2018-11-11: qty 4

## 2018-11-11 NOTE — Telephone Encounter (Signed)
Pt daughter(on DPR-thomas,nancy) has called to inform pt is at [facility] ED and she wants RN to be aware that they are doing a psych evaluation on pt.  Daughter is not asking for a call back

## 2018-11-11 NOTE — Progress Notes (Signed)
Pt. meets criteria for inpatient treatment per Earleen Newport, NP.  Referred out to the following hospitals: [facility]  [facility]      Disposition CSW will continue to follow for placement.  Areatha Keas. Judi Cong, MSW, [place] Disposition Clinical Social Work [phone] (cell) [phone] (office)

## 2018-11-11 NOTE — BH Assessment (Addendum)
Tele Assessment Note   Patient Name: [name] MRN: [id number] Referring Physician: Stark Jock Location of Patient: [facility] Location of Provider: [place] is a 84 y.o. female in ED, accompanied by her daughter, Izora Gala, who is pt's POA, and Silva Bandy, who lives w/ pt. Pt came to ED due to medical concerns. Daughters requested a psych eval based on concerns they had. Daughters indicate that for the past month, pt has been making suicidal-type statements such as she's going to walk out the front door and never coming back and they would never know. Also she's been saying that she wants to go be with her boys-who daughters clarify is her husband and 2 sons, who are all deceased. Pt has been defiant, refusing to eat and drink sustaining items Izora Gala indicates that pt only ate junk food yesterday and is dehydrated a lot). Pt has also been locking them out of the house. A butcher knife was found in pt's bedroom the other day. Silva Bandy confiscated it and secured all of the knives. They never asked pt about the knife.   Pt denies SI, HI, AVH. She admits to saying those suicidal statements, but when asked about intent, pt says, "I don't know so I can't tell you what I don't know". When asked about the knife in her bedroom, pt indicated that she doesn't remember or know how a knife got into her bedroom.   Based on daughter's descriptions of pt's behaviors, it is suspected that pt may be experiencing some dementia. Pt has a neurologist, but they have not tested pt for dementia.   Shuvon Rankin, NP, recommends gero-psych.  Diagnosis: Deferred  Past Medical History:  Past Medical History:  Diagnosis Date  . A-fib ([place])   . Biventricular cardiac pacemaker in situ    05/06/13  Old AICD explanted and 6949 lead capped  RV lead  Medtronic 5076 [id number]  LV lead  Medtronic 4194  [id number] V Medtronic biventricular pacer  PVX [id number]   . Diabetes mellitus without complication ([place])    . GERD (gastroesophageal reflux disease)   . History of duodenal ulcer   . History of esophageal stricture   . Hyperlipidemia   . Hypertension   . Occlusion and stenosis of carotid artery without mention of cerebral infarction   . Peripheral neuropathy   . Right groin pain 12/12/2014  . Secondary cardiomyopathy, unspecified   . Transient global amnesia     Past Surgical History:  Procedure Laterality Date  . ABDOMINAL HYSTERECTOMY    . APPENDECTOMY    . BI-VENTRICULAR PACEMAKER UPGRADE N/A 05/05/2013   Procedure: BI-VENTRICULAR PACEMAKER UPGRADE;  Surgeon: Evans Lance, MD;  Location: [facility] CATH LAB;  Service: Cardiovascular;  Laterality: N/A;  . BIV PACEMAKER GENERATOR CHANGE OUT  05/05/2013   MDT CRTP upgrade (previously implanted dual chamber ICD) by Dr Lovena Le.  New RV/LV leads placed.   Marland Kitchen BREAST SURGERY Left 1985   Negative  Biopsy  . CARDIAC CATHETERIZATION     04/2003  . CARDIAC DEFIBRILLATOR PLACEMENT  09/2004   MDT ICD implanted for primary prevention; device upgrade to CRTP in 04-2013  . CARPAL TUNNEL RELEASE    . CHOLECYSTECTOMY      Family History:  Family History  Problem Relation Age of Onset  . Dementia Mother   . Diabetes Mother   . Heart Problems Father   . Heart attack Son   . Heart disease Son   . Heart disease Brother   .  Heart attack Brother   . Cancer Maternal Grandmother        breast  . Stroke Daughter   . Hypertension Daughter   . Hyperlipidemia Daughter   . Diabetes Daughter   . Diabetes Sister   . Hypertension Sister   . Hyperlipidemia Sister   . Hypertension Sister   . Hyperlipidemia Sister   . Cancer Sister        Breast  . Heart Problems Sister     Social History:  reports that she is a non-smoker but has been exposed to tobacco smoke. She has never used smokeless tobacco. She reports that she does not drink alcohol or use drugs.  Additional Social History:  Alcohol / Drug Use Pain Medications: see MAR Prescriptions: see MAR Over  the Counter: see MAR History of alcohol / drug use?: No history of alcohol / drug abuse  CIWA: CIWA-Ar BP: 121/89 Pulse Rate: 60 COWS:    Allergies:  Allergies  Allergen Reactions  . Nexium [Esomeprazole Magnesium] Diarrhea    Home Medications:  (Not in a hospital admission)  OB/GYN Status:  No LMP recorded. Patient has had a hysterectomy.  General Assessment Data Location of Assessment: [facility] ED TTS Assessment: In system Is this a Tele or Face-to-Face Assessment?: Tele Assessment Is this an Initial Assessment or a Re-assessment for this encounter?: Initial Assessment Patient Accompanied by:: Adult Permission Given to speak with another: Yes Name, Relationship and Phone Number: Joneen Boers, daughter; Silva Bandy, daughter who lives w/ pt Language Other than English: No Living Arrangements: Other (Comment) What gender do you identify as?: Female Marital status: Widowed Pregnancy Status: No Living Arrangements: Children Can pt return to current living arrangement?: Yes Admission Status: Voluntary Is patient capable of signing voluntary admission?: Yes Referral Source: Self/Family/Friend     Crisis Care Plan Living Arrangements: Children Name of Psychiatrist: none Name of Therapist: none  Education Status Is patient currently in school?: No Is the patient employed, unemployed or receiving disability?: Unemployed  Risk to self with the past 6 months Suicidal Ideation: No-Not Currently/Within Last 6 Months Has patient been a risk to self within the past 6 months prior to admission? : No Suicidal Intent: No Has patient had any suicidal intent within the past 6 months prior to admission? : No Is patient at risk for suicide?: Yes Suicidal Plan?: No Has patient had any suicidal plan within the past 6 months prior to admission? : No Access to Means: No Previous Attempts/Gestures: No Intentional Self Injurious Behavior: None Family Suicide History: Unknown Persecutory  voices/beliefs?: No Depression: Yes Depression Symptoms: Feeling angry/irritable Substance abuse history and/or treatment for substance abuse?: No Suicide prevention information given to non-admitted patients: Yes  Risk to Others within the past 6 months Homicidal Ideation: No Does patient have any lifetime risk of violence toward others beyond the six months prior to admission? : No Thoughts of Harm to Others: No Current Homicidal Intent: No Current Homicidal Plan: No Access to Homicidal Means: No History of harm to others?: No Assessment of Violence: None Noted Does patient have access to weapons?: No Criminal Charges Pending?: No Does patient have a court date: No Is patient on probation?: No  Psychosis Hallucinations: None noted Delusions: None noted  Mental Status Report Appearance/Hygiene: Unremarkable Eye Contact: Fair Motor Activity: Unremarkable Speech: Unremarkable Level of Consciousness: Alert Mood: Pleasant, Euthymic Affect: Appropriate to circumstance Anxiety Level: Minimal Thought Processes: Unable to Assess Judgement: Partial Orientation: Person, Place, Time, Situation Obsessive Compulsive Thoughts/Behaviors: None  Cognitive  Functioning Concentration: Fair Memory: Unable to Assess Is patient IDD: No Insight: Fair Impulse Control: Unable to Assess Appetite: Poor Have you had any weight changes? : No Change Sleep: No Change Vegetative Symptoms: None  ADLScreening [facility] Assessment Services) Patient's cognitive ability adequate to safely complete daily activities?: No Patient able to express need for assistance with ADLs?: Yes  Prior Inpatient Therapy Prior Inpatient Therapy: No  Prior Outpatient Therapy Prior Outpatient Therapy: No Does patient have an ACCT team?: No Does patient have Intensive In-House Services?  : No Does patient have [facility] services? : No Does patient have P4CC services?: No  ADL Screening (condition at time of  admission) Patient's cognitive ability adequate to safely complete daily activities?: No Is the patient deaf or have difficulty hearing?: Yes Does the patient have difficulty seeing, even when wearing glasses/contacts?: No Does the patient have difficulty concentrating, remembering, or making decisions?: Yes Patient able to express need for assistance with ADLs?: Yes       Abuse/Neglect Assessment (Assessment to be complete while patient is alone) Abuse/Neglect Assessment Can Be Completed: Yes Physical Abuse: Denies Verbal Abuse: Denies Sexual Abuse: Denies Exploitation of patient/patient's resources: Denies Self-Neglect: Denies     Regulatory affairs officer (For Healthcare) Does Patient Have a Medical Advance Directive?: Yes          Disposition:  Disposition Initial Assessment Completed for this Encounter: Yes  This service was provided via telemedicine using a 2-way, interactive audio and video technology.  Names of all persons participating in this telemedicine service and their role in this encounter.   Rexene Edison 11/11/2018 12:21 PM

## 2018-11-11 NOTE — ED Notes (Signed)
Pt placed in purple scrubs and wanded by security 

## 2018-11-11 NOTE — ED Notes (Signed)
Sheriffs department at bedside serving pt

## 2018-11-11 NOTE — ED Notes (Signed)
Family at bedside.pt given food and something to drink

## 2018-11-11 NOTE — Progress Notes (Signed)
Pt accepted to [facility] Dr. Lucilla Edin is the accepting/attending provider.   Call report to [phone] [place]  @ [facility] ED notified. He stated that he would contact the family.   Pt is involuntary and will be transported by law enforcement.  IVC paperwork will need to be faxed [phone]) to [facility] prior to transport   Pt may arrive at [facility] anytime tonight or on 11/12/18 after [place], [place], [place] Disposition CSW [facility]/TTS [phone]

## 2018-11-11 NOTE — ED Notes (Signed)
Pt making phone call at desk

## 2018-11-11 NOTE — ED Notes (Signed)
Pt's family returned phone call after message was left from phone call by patient earlier.

## 2018-11-11 NOTE — Consult Note (Signed)
  Tele psych Assessment   [name], 84 y.o., female patient seen via tele psych by TTS and this provider; chart reviewed and consulted with Dr. Dwyane Dee on 11/11/18.  On evaluation ADYA WIRZ reports she doesn't know why she was brought to the hospital.  Patient states "I've answered all of these questions before; I'm tired of talking."  Patient was able to state her date of birth, current place, name, president of [place] name; but became irritated and yelling that she was not going to answer any more questions.  Patients daughter at bedside and informed that patient has been telling family that she wanted to die "She has said that she was going to walk down the street and never come back and no one will ever know.  She's said that she wants to go and be with her husband and 2 sons who are deceased; and she has told us that she wants to take her life."  Daughter states that they are concerned about patient taking her life and that there has been an increase in anger, irritability and patient lashing out at family  Members.  States that patient has never been assess for dementia.   Patient would not respond when asked if she was having thoughts of killing herself; patient stated to her daughter "that's none of her business and I don't have to answer."  Patient denied homicidal ideation, psychosis, and paranoia.     For detailed note see TTS tele assessment note  Recommendations:  Inpatient psychiatric treatment.  Patient will need urinalysis, and chest x-ray for referral to psychiatric hospital for review  Disposition:   Recommend psychiatric Inpatient admission when medically cleared.  Spoke with Dr. Stark Jock informed of above recommendation and disposition  Earleen Newport, NP

## 2018-11-11 NOTE — ED Triage Notes (Signed)
Pt here from home with c/o syncopal episode on the toilet this morning pt has no c/o pain ,  Family would like a psych eval done on pt due to the fact that she pulled a knife out stating that she was going to kill herself or someone ? , pt states that she has no SI or HI

## 2018-11-11 NOTE — ED Provider Notes (Signed)
[facility] EMERGENCY DEPARTMENT Provider Note   CSN: [id number] Arrival date & time: 11/11/18  2951     History   Chief Complaint Chief Complaint  Patient presents with  . Loss of Consciousness    HPI [name] is a 84 y.o. female.  Patient is a 84 year old female with past medical history of pacemaker placement, A. fib, diabetes, hypertension.  She is brought for evaluation of an episode of weakness and near syncope that occurred this morning.  She states she was sitting on the toilet when she became weak.  She states that she leaned forward to her knees until the sensation passed.  She denies to me that she has fallen or injured herself and has no complaints at present.  From what I understand, the patient's daughter reported to EMS that the patient threatened to harm herself with a knife at some point this morning.  Patient denies to me that this episode occurred and states that she is not suicidal or homicidal.  The history is provided by the patient.  Loss of Consciousness   This is a new problem. The current episode started less than 1 hour ago. The problem occurs constantly. The problem has been resolved. There was no loss of consciousness. The problem is associated with normal activity. Pertinent negatives include congestion, fever, light-headedness, malaise/fatigue and palpitations. She has tried nothing for the symptoms.    Past Medical History:  Diagnosis Date  . A-fib ([place])   . Biventricular cardiac pacemaker in situ    05/06/13  Old AICD explanted and 6949 lead capped  RV lead  Medtronic 5076 [id number]  LV lead  Medtronic 4194  [id number] V Medtronic biventricular pacer  PVX [id number]   . Diabetes mellitus without complication ([place])   . GERD (gastroesophageal reflux disease)   . History of duodenal ulcer   . History of esophageal stricture   . Hyperlipidemia   . Hypertension   . Occlusion and stenosis of carotid artery without mention of  cerebral infarction   . Peripheral neuropathy   . Right groin pain 12/12/2014  . Secondary cardiomyopathy, unspecified   . Transient global amnesia     Patient Active Problem List   Diagnosis Date Noted  . Syncope and collapse 09/09/2018  . ASHD (arteriosclerotic heart disease) 12/05/2014  . GERD (gastroesophageal reflux disease) 12/05/2014  . Prediabetes 05/21/2014  . Vitamin D deficiency 05/20/2014  . Medication management 05/20/2014  . CKD (chronic kidney disease), stage III ([place]) 06/21/2013  . History of vasodepressor syncope   . Biventricular cardiac pacemaker in situ   . Mixed hyperlipidemia   . Hypertension   . LBBB   . Chronic systolic heart failure ([place])   . Dilated cardiomyopathy, nonischemic     Past Surgical History:  Procedure Laterality Date  . ABDOMINAL HYSTERECTOMY    . APPENDECTOMY    . BI-VENTRICULAR PACEMAKER UPGRADE N/A 05/05/2013   Procedure: BI-VENTRICULAR PACEMAKER UPGRADE;  Surgeon: Evans Lance, MD;  Location: [facility] CATH LAB;  Service: Cardiovascular;  Laterality: N/A;  . BIV PACEMAKER GENERATOR CHANGE OUT  05/05/2013   MDT CRTP upgrade (previously implanted dual chamber ICD) by Dr Lovena Le.  New RV/LV leads placed.   Marland Kitchen BREAST SURGERY Left 1985   Negative  Biopsy  . CARDIAC CATHETERIZATION     04/2003  . CARDIAC DEFIBRILLATOR PLACEMENT  09/2004   MDT ICD implanted for primary prevention; device upgrade to CRTP in 04-2013  . CARPAL TUNNEL RELEASE    . CHOLECYSTECTOMY  OB History   None      Home Medications    Prior to Admission medications   Medication Sig Start Date End Date Taking? Authorizing Provider  aspirin EC 81 MG tablet Take 81 mg by mouth daily.    [provider]  b complex vitamins capsule Take 1 capsule by mouth daily.     [provider]  bumetanide (BUMEX) 1 MG tablet TAKE 1/2 TABLET BY MOUTH AS NEEDED FOR SWELLING Patient taking differently: Take 0.5 mg by mouth daily as needed (Swelling).  07/08/18    Unk Pinto, MD  carvedilol (COREG) 6.25 MG tablet Take 1 tablet (6.25 mg total) by mouth 2 (two) times daily with a meal. 11/09/18   Unk Pinto, MD  Cholecalciferol (VITAMIN D) 2000 units CAPS Take 4,000 Units by mouth daily.     [provider]  clopidogrel (PLAVIX) 75 MG tablet TAKE 1 TABLET BY MOUTH EVERY DAY TO PREVENT HEART ATTACK 10/08/18   Unk Pinto, MD  gabapentin (NEURONTIN) 600 MG tablet TAKE 1 TABLET BY MOUTH EVERY EVENING AT BEDTIME Patient taking differently: Take 600 mg by mouth every evening.  06/01/18   Unk Pinto, MD  levETIRAcetam (KEPPRA) 250 MG tablet Take 1 tablet (250 mg total) by mouth 2 (two) times daily. 10/14/18   Dohmeier, Asencion Partridge, MD  losartan (COZAAR) 50 MG tablet Take 1 tablet 2 x/day  for blood pressure. 11/09/18   Unk Pinto, MD  magnesium gluconate (MAGONATE) 500 MG tablet Take 500 mg by mouth daily.     [provider]  ranitidine (ZANTAC) 300 MG tablet TAKE 1 TABLET TWICE A DAY FOR REFLUX Patient taking differently: Take 300 mg by mouth 2 (two) times daily.  06/01/18   Unk Pinto, MD    Family History Family History  Problem Relation Age of Onset  . Dementia Mother   . Diabetes Mother   . Heart Problems Father   . Heart attack Son   . Heart disease Son   . Heart disease Brother   . Heart attack Brother   . Cancer Maternal Grandmother        breast  . Stroke Daughter   . Hypertension Daughter   . Hyperlipidemia Daughter   . Diabetes Daughter   . Diabetes Sister   . Hypertension Sister   . Hyperlipidemia Sister   . Hypertension Sister   . Hyperlipidemia Sister   . Cancer Sister        Breast  . Heart Problems Sister     Social History Social History   Tobacco Use  . Smoking status: Passive Smoke Exposure - Never Smoker  . Smokeless tobacco: Never Used  . Tobacco comment: worked at [employer] Topics  . Alcohol use: No    Alcohol/week: 0.0 standard drinks  . Drug use: No      Allergies   Nexium [esomeprazole magnesium]   Review of Systems Review of Systems  Constitutional: Negative for fever and malaise/fatigue.  HENT: Negative for congestion.   Cardiovascular: Positive for syncope. Negative for palpitations.  Neurological: Negative for light-headedness.  All other systems reviewed and are negative.    Physical Exam Updated Vital Signs BP 121/89   Pulse 60   Temp (!) 97.5 F (36.4 C) (Oral)   Resp 20   SpO2 100%   Physical Exam  Constitutional: She is oriented to person, place, and time. She appears well-developed and well-nourished. No distress.  HENT:  Head: Normocephalic and atraumatic.  Eyes: Pupils  are equal, round, and reactive to light.  Neck: Normal range of motion. Neck supple.  Cardiovascular: Normal rate and regular rhythm. Exam reveals no gallop and no friction rub.  No murmur heard. Pulmonary/Chest: Effort normal and breath sounds normal. No respiratory distress. She has no wheezes.  Abdominal: Soft. Bowel sounds are normal. She exhibits no distension. There is no tenderness.  Musculoskeletal: Normal range of motion.  Neurological: She is alert and oriented to person, place, and time. No cranial nerve deficit. She exhibits normal muscle tone. Coordination normal.  Skin: Skin is warm and dry. She is not diaphoretic.  Nursing note and vitals reviewed.    ED Treatments / Results  Labs (all labs ordered are listed, but only abnormal results are displayed) Labs Reviewed - No data to display  EKG EKG Interpretation  Date/Time:  Wednesday November 11 2018 08:45:29 EST Ventricular Rate:  60 PR Interval:    QRS Duration: 143 QT Interval:  519 QTC Calculation: 519 R Axis:   -80 Text Interpretation:  Sinus rhythm Borderline short PR interval IVCD, consider atypical RBBB LVH with secondary repolarization abnormality Anterolateral infarct, age indeterminate Confirmed by Veryl Speak [phone]) on 11/11/2018 8:55:25  AM   Radiology No results found.  Procedures Procedures (including critical care time)  Medications Ordered in ED Medications - No data to display   Initial Impression / Assessment and Plan / ED Course  I have reviewed the triage vital signs and the nursing notes.  Pertinent labs & imaging results that were available during my care of the patient were reviewed by me and considered in my medical decision making (see chart for details).  Patient brought for evaluation of an episode of weakness that occurred at home this morning.  Apparently she has been having these episodes for quite some time.  I see no cause for this.  Her work-up medically is essentially unremarkable and she appears medically cleared.  Upon further discussion with the patient's daughter at bedside, this patient has been making suicidal threats for quite some time.  She was apparently found with a butcher's knife hidden in her room which she had threatened to harm herself with.  She has been evaluated by TTS who feel as though she requires inpatient hospitalization for psychiatric reasons.  I updated the patient's daughter of these recommendations and she is in agreement with this.  The patient does not want to be admitted for psychiatric treatment and is adamant about going home.  Patient will undergo involuntary commitment due to the nature of her threats and not voluntarily agreeing to the treatment plan.  Final Clinical Impressions(s) / ED Diagnoses   Final diagnoses:  None    ED Discharge Orders    None       Veryl Speak, MD 11/11/18 1429

## 2018-11-12 DIAGNOSIS — I517 Cardiomegaly: Secondary | ICD-10-CM | POA: Diagnosis not present

## 2018-11-12 DIAGNOSIS — I428 Other cardiomyopathies: Secondary | ICD-10-CM | POA: Diagnosis not present

## 2018-11-12 DIAGNOSIS — M25531 Pain in right wrist: Secondary | ICD-10-CM | POA: Diagnosis not present

## 2018-11-12 DIAGNOSIS — F418 Other specified anxiety disorders: Secondary | ICD-10-CM | POA: Diagnosis not present

## 2018-11-12 DIAGNOSIS — N3000 Acute cystitis without hematuria: Secondary | ICD-10-CM | POA: Diagnosis not present

## 2018-11-12 DIAGNOSIS — E785 Hyperlipidemia, unspecified: Secondary | ICD-10-CM | POA: Diagnosis not present

## 2018-11-12 DIAGNOSIS — I63521 Cerebral infarction due to unspecified occlusion or stenosis of right anterior cerebral artery: Secondary | ICD-10-CM | POA: Diagnosis not present

## 2018-11-12 DIAGNOSIS — M47816 Spondylosis without myelopathy or radiculopathy, lumbar region: Secondary | ICD-10-CM | POA: Diagnosis not present

## 2018-11-12 DIAGNOSIS — M1611 Unilateral primary osteoarthritis, right hip: Secondary | ICD-10-CM | POA: Diagnosis not present

## 2018-11-12 DIAGNOSIS — R45851 Suicidal ideations: Secondary | ICD-10-CM | POA: Diagnosis not present

## 2018-11-12 DIAGNOSIS — I429 Cardiomyopathy, unspecified: Secondary | ICD-10-CM | POA: Diagnosis not present

## 2018-11-12 DIAGNOSIS — E1142 Type 2 diabetes mellitus with diabetic polyneuropathy: Secondary | ICD-10-CM | POA: Diagnosis present

## 2018-11-12 DIAGNOSIS — F29 Unspecified psychosis not due to a substance or known physiological condition: Secondary | ICD-10-CM | POA: Diagnosis not present

## 2018-11-12 DIAGNOSIS — B961 Klebsiella pneumoniae [K. pneumoniae] as the cause of diseases classified elsewhere: Secondary | ICD-10-CM | POA: Diagnosis not present

## 2018-11-12 DIAGNOSIS — I5189 Other ill-defined heart diseases: Secondary | ICD-10-CM | POA: Diagnosis not present

## 2018-11-12 DIAGNOSIS — E1122 Type 2 diabetes mellitus with diabetic chronic kidney disease: Secondary | ICD-10-CM | POA: Diagnosis not present

## 2018-11-12 DIAGNOSIS — I359 Nonrheumatic aortic valve disorder, unspecified: Secondary | ICD-10-CM | POA: Diagnosis not present

## 2018-11-12 DIAGNOSIS — K5909 Other constipation: Secondary | ICD-10-CM | POA: Diagnosis not present

## 2018-11-12 DIAGNOSIS — R339 Retention of urine, unspecified: Secondary | ICD-10-CM | POA: Diagnosis not present

## 2018-11-12 DIAGNOSIS — K210 Gastro-esophageal reflux disease with esophagitis: Secondary | ICD-10-CM | POA: Diagnosis not present

## 2018-11-12 DIAGNOSIS — G301 Alzheimer's disease with late onset: Secondary | ICD-10-CM | POA: Diagnosis not present

## 2018-11-12 DIAGNOSIS — I5022 Chronic systolic (congestive) heart failure: Secondary | ICD-10-CM | POA: Diagnosis not present

## 2018-11-12 DIAGNOSIS — E559 Vitamin D deficiency, unspecified: Secondary | ICD-10-CM | POA: Diagnosis present

## 2018-11-12 DIAGNOSIS — Z9181 History of falling: Secondary | ICD-10-CM | POA: Diagnosis not present

## 2018-11-12 DIAGNOSIS — I2510 Atherosclerotic heart disease of native coronary artery without angina pectoris: Secondary | ICD-10-CM | POA: Diagnosis present

## 2018-11-12 DIAGNOSIS — Z888 Allergy status to other drugs, medicaments and biological substances status: Secondary | ICD-10-CM | POA: Diagnosis not present

## 2018-11-12 DIAGNOSIS — Z79899 Other long term (current) drug therapy: Secondary | ICD-10-CM | POA: Diagnosis not present

## 2018-11-12 DIAGNOSIS — I4891 Unspecified atrial fibrillation: Secondary | ICD-10-CM | POA: Diagnosis not present

## 2018-11-12 DIAGNOSIS — I130 Hypertensive heart and chronic kidney disease with heart failure and stage 1 through stage 4 chronic kidney disease, or unspecified chronic kidney disease: Secondary | ICD-10-CM | POA: Diagnosis not present

## 2018-11-12 DIAGNOSIS — F0391 Unspecified dementia with behavioral disturbance: Secondary | ICD-10-CM | POA: Diagnosis not present

## 2018-11-12 DIAGNOSIS — I352 Nonrheumatic aortic (valve) stenosis with insufficiency: Secondary | ICD-10-CM | POA: Diagnosis not present

## 2018-11-12 DIAGNOSIS — Z7902 Long term (current) use of antithrombotics/antiplatelets: Secondary | ICD-10-CM | POA: Diagnosis not present

## 2018-11-12 DIAGNOSIS — Z950 Presence of cardiac pacemaker: Secondary | ICD-10-CM | POA: Diagnosis not present

## 2018-11-12 DIAGNOSIS — R55 Syncope and collapse: Secondary | ICD-10-CM | POA: Diagnosis not present

## 2018-11-12 DIAGNOSIS — S79911A Unspecified injury of right hip, initial encounter: Secondary | ICD-10-CM | POA: Diagnosis not present

## 2018-11-12 DIAGNOSIS — F0281 Dementia in other diseases classified elsewhere with behavioral disturbance: Secondary | ICD-10-CM | POA: Diagnosis not present

## 2018-11-12 DIAGNOSIS — M47896 Other spondylosis, lumbar region: Secondary | ICD-10-CM | POA: Diagnosis not present

## 2018-11-12 DIAGNOSIS — I340 Nonrheumatic mitral (valve) insufficiency: Secondary | ICD-10-CM | POA: Diagnosis not present

## 2018-11-12 DIAGNOSIS — E1140 Type 2 diabetes mellitus with diabetic neuropathy, unspecified: Secondary | ICD-10-CM | POA: Diagnosis not present

## 2018-11-12 DIAGNOSIS — M25521 Pain in right elbow: Secondary | ICD-10-CM | POA: Diagnosis not present

## 2018-11-12 DIAGNOSIS — I447 Left bundle-branch block, unspecified: Secondary | ICD-10-CM | POA: Diagnosis not present

## 2018-11-12 DIAGNOSIS — G40909 Epilepsy, unspecified, not intractable, without status epilepticus: Secondary | ICD-10-CM | POA: Diagnosis not present

## 2018-11-12 DIAGNOSIS — N183 Chronic kidney disease, stage 3 (moderate): Secondary | ICD-10-CM | POA: Diagnosis not present

## 2018-11-12 DIAGNOSIS — I10 Essential (primary) hypertension: Secondary | ICD-10-CM | POA: Diagnosis not present

## 2018-11-12 DIAGNOSIS — Z515 Encounter for palliative care: Secondary | ICD-10-CM | POA: Diagnosis not present

## 2018-11-12 DIAGNOSIS — R531 Weakness: Secondary | ICD-10-CM | POA: Diagnosis not present

## 2018-11-12 DIAGNOSIS — Z7189 Other specified counseling: Secondary | ICD-10-CM | POA: Diagnosis not present

## 2018-11-12 DIAGNOSIS — Z7982 Long term (current) use of aspirin: Secondary | ICD-10-CM | POA: Diagnosis not present

## 2018-11-12 DIAGNOSIS — I129 Hypertensive chronic kidney disease with stage 1 through stage 4 chronic kidney disease, or unspecified chronic kidney disease: Secondary | ICD-10-CM | POA: Diagnosis not present

## 2018-11-12 DIAGNOSIS — G909 Disorder of the autonomic nervous system, unspecified: Secondary | ICD-10-CM | POA: Diagnosis not present

## 2018-11-12 DIAGNOSIS — M545 Low back pain: Secondary | ICD-10-CM | POA: Diagnosis not present

## 2018-11-12 DIAGNOSIS — I350 Nonrheumatic aortic (valve) stenosis: Secondary | ICD-10-CM | POA: Diagnosis not present

## 2018-11-12 DIAGNOSIS — K219 Gastro-esophageal reflux disease without esophagitis: Secondary | ICD-10-CM | POA: Diagnosis not present

## 2018-11-12 DIAGNOSIS — R404 Transient alteration of awareness: Secondary | ICD-10-CM | POA: Diagnosis not present

## 2018-11-12 LAB — RAPID URINE DRUG SCREEN, HOSP PERFORMED
Amphetamines: NOT DETECTED
Barbiturates: NOT DETECTED
Benzodiazepines: NOT DETECTED
Cocaine: NOT DETECTED
Opiates: NOT DETECTED
Tetrahydrocannabinol: NOT DETECTED

## 2018-11-12 MED ORDER — CEPHALEXIN 250 MG PO CAPS
500.0000 mg | ORAL_CAPSULE | Freq: Once | ORAL | Status: AC
  Administered 2018-11-12: 500 mg via ORAL
  Filled 2018-11-12: qty 2

## 2018-11-12 MED ORDER — CEPHALEXIN 500 MG PO CAPS: 500.0000 mg | ORAL_CAPSULE | Freq: Two times a day (BID) | ORAL | 0 refills | Status: AC

## 2018-11-12 NOTE — ED Notes (Signed)
Per Romie Minus at [facility], pt likely going to [facility] instead of [facility], updated POA on change of plan, POA agreeable to this plan

## 2018-11-12 NOTE — Progress Notes (Addendum)
Pt accepted to [facility] WHEN BED IS AVAILABLE THIS AFTERNOON Dr. Geanie Kenning, MD is the accepting/attending provider.  Call report to [phone] [facility] Psych ED notified.   Pt is IVC .  Pt may be transported by [employer] Pt scheduled  to arrive at [facility] as soon as transport can be arranged.  Areatha Keas. Judi Cong, MSW, [place] Disposition Clinical Social Work [phone] (cell) [phone] (office) .  CSW contacted [facility] and advised that patient would not be transferring to them.  Areatha Keas. Judi Cong, MSW, [place] Disposition Clinical Social Work [phone] (cell) [phone] (office)

## 2018-11-12 NOTE — ED Notes (Signed)
IVC paperwork faxed to [facility], initial report called to [facility] unit, full report requested on transport arrival

## 2018-11-12 NOTE — ED Provider Notes (Addendum)
84 year old female being held in psychiatric pod awaiting placement at psychiatric facility.  I have been asked by nursing staff to speak to this patient after she would not take her morning blood pressure medicine.  After reviewing previous providers note patient was here for weakness as well as suicidal threats and was found to have knife hidden in her room.  Patient was evaluated by TTS who advised psychiatric hospitalization.  While reviewing labs it is noted that the patient has urinalysis positive for nitrates, large leukocytes and many bacteria.  Consistent for urinary tract infection.  BMP shows elevated creatinine approximately baseline CBC shows elevated white blood cell count Chest x-ray shows ICD, atelectasis and atherosclerosis EKG reviewed by Dr. Stark Jock Vital signs reviewed, slightly elevated blood pressure noted otherwise unremarkable. Physical Exam  BP (!) 180/64 (BP Location: Right Arm) Comment: Nurse was notified  Pulse 60   Temp 97.8 F (36.6 C) (Oral)   Resp 20   SpO2 97%   Physical Exam  Constitutional: She is oriented to person, place, and time. She appears well-developed and well-nourished. No distress.  Patient sleeping comfortably, easily arousable, no acute distress.  States that she feels well.  Admits to being agitated for having to stay in the hospital.  HENT:  Head: Normocephalic and atraumatic.  Right Ear: External ear normal.  Left Ear: External ear normal.  Nose: Nose normal.  Mouth/Throat: Oropharynx is clear and moist.  Eyes: Pupils are equal, round, and reactive to light. EOM are normal.  Neck: Trachea normal and normal range of motion. Neck supple. No tracheal deviation present.  Pulmonary/Chest: Effort normal. No respiratory distress.  Abdominal: Soft. There is no tenderness. There is no rebound and no guarding.  Musculoskeletal: Normal range of motion.  Neurological: She is alert and oriented to person, place, and time.  Skin: Skin is warm and  dry.  Psychiatric: She has a normal mood and affect. Her behavior is normal.    ED Course/Procedures     Procedures  MDM  84 year old female evaluated in psychiatric holding unit.  Noted to have UTI on urinalysis.  Urine has been sent for culture.  Creatinine clearance calculated using Cockcroft-Gault equation shows patient may receive 500 mg twice daily of Keflex.  This has been ordered.  Patient has been convinced to take daily blood pressure medications by myself.  Chart reviewed shows patient's last acute cystitis with use of Keflex was 09/06/2017.  Patient's case discussed with Dr. Alvino Chapel who agrees with plan to treat patient with Keflex.  Plan of care discussed with patient's daughter at bedside who is in agreement. Keflex Rx given to [facility].  Note: Portions of this report may have been transcribed using voice recognition software. Every effort was made to ensure accuracy; however, inadvertent computerized transcription errors may still be present.     Deliah Boston, PA-C 11/12/18 1330    Gari Crown 11/12/18 1330    Davonna Belling, MD 11/12/18 [phone]

## 2018-11-12 NOTE — Progress Notes (Signed)
Patient previously accepted to [facility].  Family is unhappy with patient being that far away.  This morning CSW received a call from [facility] expressing an interest in accepting the patient.  If she is accepted, this appears to be a better option for patient and family.  CSW sent chest x-ray, EKG, UA, results to [place].  Awaiting possible acception by them.  CSW will contact [facility] when Deport bed is secured. CSW advised RN, [name], who agrees to request a UDS on patient.  UDS will be sent to [facility] asap.  CSW will contact family when outcome is determined.  Areatha Keas. Judi Cong, MSW, [place] Disposition Clinical Social Work [phone] (cell) [phone] (office)

## 2018-11-12 NOTE — ED Notes (Signed)
BREAKFAST TRAY ORDERED 

## 2018-11-12 NOTE — ED Notes (Signed)
Pt gave verbal permission to update other daughter Olin Hauser for care

## 2018-11-14 LAB — URINE CULTURE: Culture: >=100000 — AB

## 2018-11-15 ENCOUNTER — Telehealth: Payer: Self-pay

## 2018-11-15 NOTE — Telephone Encounter (Signed)
Post ED Visit - Positive Culture Follow-up  Culture report reviewed by antimicrobial stewardship pharmacist:  []  Elenor Quinones, Pharm.D. []  Heide Guile, Pharm.D., BCPS AQ-ID []  Parks Neptune, Pharm.D., BCPS []  Alycia Rossetti, Pharm.D., BCPS []  Humphreys, Pharm.D., BCPS, AAHIVP []  Legrand Como, Pharm.D., BCPS, AAHIVP []  Salome Arnt, PharmD, BCPS []  Johnnette Gourd, PharmD, BCPS []  Hughes Better, PharmD, BCPS []  Leeroy Cha, PharmD [facility] Pharm D Positive urine culture Treated with Cephalexin, organism sensitive to the same and no further patient follow-up is required at this time.  Genia Del 11/15/2018, 9:47 AM

## 2018-11-16 ENCOUNTER — Telehealth: Payer: Self-pay | Admitting: Neurology

## 2018-11-16 ENCOUNTER — Telehealth: Payer: Self-pay | Admitting: *Deleted

## 2018-11-16 DIAGNOSIS — R55 Syncope and collapse: Secondary | ICD-10-CM | POA: Diagnosis not present

## 2018-11-16 NOTE — Telephone Encounter (Signed)
Patient's daughter Izora Gala (on [place]) asking if there has ever been any evidence of dementia.

## 2018-11-16 NOTE — Telephone Encounter (Signed)
Patient's daughter called and asked if the pastient has a diagnosis of dementia. Per Dr Melford Aase, he feels the patient has dementia. Izora Gala is aware.

## 2018-11-16 NOTE — Telephone Encounter (Signed)
Called the pt daughter back. In looking in past notes there was never mention of having dementia or dementia like episodes. I reviewed the EEG results from sept again with the patient's daughter. Patient states that she has been rescheduled for the extended EEG in January. Pt's daughter reported last week she went into the ED and had a psych eval completed in which she was involuntary commited. She was placed in [facility] for the psych eval. Her heart monitor that was set up with Dr Tanna Furry office was disconnected early wed am when she went into the hospital. Advised the patient's daughter to contact Dr Tanna Furry office to inform them of that and set up restarting the monitor when she is dc home. Pt's daughter verbalized understanding.

## 2018-11-18 ENCOUNTER — Telehealth: Payer: Self-pay | Admitting: Internal Medicine

## 2018-11-18 NOTE — Telephone Encounter (Signed)
Spoke with Damaris Hippo, NP, at [facility]. Patient is admitted there due to recurrent syncopal episodes. Interpreted [facility] from 11/17/18 via phone. 6 "VT-NS" episodes detected by device since 10/03/18--EGMs/markers appear 1:1 conducted AT. Anza phone number for any additional questions/concerns. She verbalized appreciation of assistance.

## 2018-11-18 NOTE — Telephone Encounter (Signed)
  [name] is requesting information regarding most recent pacemaker interrogation. They would like to no if she has had any recent events.

## 2018-11-21 ENCOUNTER — Other Ambulatory Visit: Payer: Self-pay | Admitting: Internal Medicine

## 2018-11-21 MED ORDER — FAMOTIDINE 20 MG PO TABS: ORAL_TABLET | ORAL | 0 refills | Status: AC

## 2018-11-24 ENCOUNTER — Telehealth: Payer: Self-pay | Admitting: *Deleted

## 2018-11-24 NOTE — Telephone Encounter (Signed)
Patient's daughter called with concerns regarding her mother's health.  She remains in the facility in [place], [place] for her mental health.  The doctors have started her mother on Trazodone, Lexapro, Remeron and Respirodol. She is unsteady on her feet, has fallen and appears bo be in a "zombie like " state. The daughter wants to sign her mother out, since she is the POA.  Per Vicie Mutters, PA, the daughter was advised to speak with house doctor and request the patient be weaned off these medications, prior to discharge, due to the withdrawal symptoms. The daughter is aware and will speak to the doctor.

## 2018-11-27 ENCOUNTER — Other Ambulatory Visit: Payer: Self-pay

## 2018-11-27 ENCOUNTER — Encounter (HOSPITAL_COMMUNITY): Payer: Self-pay

## 2018-11-27 ENCOUNTER — Observation Stay (HOSPITAL_COMMUNITY)
Admission: EM | Admit: 2018-11-27 | Discharge: 2018-11-28 | Disposition: A | Discharge status: Home / Self Care | Payer: Medicare Other | Attending: Internal Medicine | Admitting: Internal Medicine

## 2018-11-27 DIAGNOSIS — I130 Hypertensive heart and chronic kidney disease with heart failure and stage 1 through stage 4 chronic kidney disease, or unspecified chronic kidney disease: Secondary | ICD-10-CM | POA: Diagnosis not present

## 2018-11-27 DIAGNOSIS — E1122 Type 2 diabetes mellitus with diabetic chronic kidney disease: Secondary | ICD-10-CM | POA: Insufficient documentation

## 2018-11-27 DIAGNOSIS — Z7982 Long term (current) use of aspirin: Secondary | ICD-10-CM | POA: Insufficient documentation

## 2018-11-27 DIAGNOSIS — I350 Nonrheumatic aortic (valve) stenosis: Secondary | ICD-10-CM | POA: Insufficient documentation

## 2018-11-27 DIAGNOSIS — E1140 Type 2 diabetes mellitus with diabetic neuropathy, unspecified: Secondary | ICD-10-CM | POA: Insufficient documentation

## 2018-11-27 DIAGNOSIS — I5022 Chronic systolic (congestive) heart failure: Secondary | ICD-10-CM | POA: Insufficient documentation

## 2018-11-27 DIAGNOSIS — I10 Essential (primary) hypertension: Secondary | ICD-10-CM | POA: Diagnosis not present

## 2018-11-27 DIAGNOSIS — F418 Other specified anxiety disorders: Secondary | ICD-10-CM | POA: Insufficient documentation

## 2018-11-27 DIAGNOSIS — R55 Syncope and collapse: Principal | ICD-10-CM | POA: Insufficient documentation

## 2018-11-27 DIAGNOSIS — N183 Chronic kidney disease, stage 3 unspecified: Secondary | ICD-10-CM | POA: Diagnosis present

## 2018-11-27 DIAGNOSIS — Z79899 Other long term (current) drug therapy: Secondary | ICD-10-CM | POA: Insufficient documentation

## 2018-11-27 DIAGNOSIS — Z950 Presence of cardiac pacemaker: Secondary | ICD-10-CM | POA: Insufficient documentation

## 2018-11-27 DIAGNOSIS — I4891 Unspecified atrial fibrillation: Secondary | ICD-10-CM | POA: Insufficient documentation

## 2018-11-27 LAB — CBC WITH DIFFERENTIAL/PLATELET
Abs Immature Granulocytes: 0.04 10*3/uL (ref 0.00–0.07)
Basophils Absolute: 0.1 10*3/uL (ref 0.0–0.1)
Basophils Relative: 1 %
EOS PCT: 2 %
Eosinophils Absolute: 0.2 10*3/uL (ref 0.0–0.5)
HCT: 37.2 % (ref 36.0–46.0)
Hemoglobin: 12.3 g/dL (ref 12.0–15.0)
Immature Granulocytes: 0 %
Lymphocytes Relative: 23 %
Lymphs Abs: 2.5 10*3/uL (ref 0.7–4.0)
MCH: 31 pg (ref 26.0–34.0)
MCHC: 33.1 g/dL (ref 30.0–36.0)
MCV: 93.7 fL (ref 80.0–100.0)
MONO ABS: 0.9 10*3/uL (ref 0.1–1.0)
Monocytes Relative: 8 %
Neutro Abs: 6.9 10*3/uL (ref 1.7–7.7)
Neutrophils Relative %: 66 %
Platelets: 229 10*3/uL (ref 150–400)
RBC: 3.97 MIL/uL (ref 3.87–5.11)
RDW: 11.9 % (ref 11.5–15.5)
WBC: 10.6 10*3/uL — ABNORMAL HIGH (ref 4.0–10.5)
nRBC: 0 % (ref 0.0–0.2)

## 2018-11-27 LAB — BASIC METABOLIC PANEL
Anion gap: 11 (ref 5–15)
BUN: 23 mg/dL (ref 8–23)
CALCIUM: 9.2 mg/dL (ref 8.9–10.3)
CO2: 22 mmol/L (ref 22–32)
CREATININE: 1.22 mg/dL — AB (ref 0.44–1.00)
Chloride: 107 mmol/L (ref 98–111)
GFR calc Af Amer: 45 mL/min — ABNORMAL LOW (ref >60)
GFR calc non Af Amer: 39 mL/min — ABNORMAL LOW (ref >60)
Glucose, Bld: 146 mg/dL — ABNORMAL HIGH (ref 70–99)
Potassium: 4.1 mmol/L (ref 3.5–5.1)
Sodium: 140 mmol/L (ref 135–145)

## 2018-11-27 NOTE — ED Triage Notes (Signed)
Pt from home lives with family, here for eval for a syncopal episode.  Seen here and at [facility] in the month for the same.  A&Ox3 confused at baseline.

## 2018-11-27 NOTE — ED Provider Notes (Signed)
[facility] EMERGENCY DEPARTMENT Provider Note   CSN: [id number] Arrival date & time: 11/27/18  1931     History   Chief Complaint Chief Complaint  Patient presents with  . Loss of Consciousness    HPI [name] is a 84 y.o. female.  HPI   Patient presents for evaluation of recurrent syncope, x2 today.  The first episode occurred while she was hospitalized at [facility].  The second occurred tonight while she was at home sitting on a commode and having a bowel movement.  She was not straining either time.  Each episode was brief.  Her family members feel that she cannot be managed at home because of the recurrent syncope.  She was discharged today from a psychiatric hospitalization, during which time she had multiple episodes of syncope.  She was reportedly evaluated for etiology of syncope and told that she had an aortic valve problem.  She was felt to not be a candidate for operative intervention by report of the daughter.  All history is from her daughter.  Her daughter is also concerned about the mother having "swelling of her legs."  She commonly does not have swelling.  Apparently during hospitalization recently she has been sitting in the chair most of the day because of the fear of her falling secondary to syncope.  The patient is unable to give any history.  She is apparently taking her usual prescribed medications at her facility, and there is no report of vomiting or diarrhea.  There are no other known modifying factors.  Past Medical History:  Diagnosis Date  . A-fib ([place])   . Biventricular cardiac pacemaker in situ    05/06/13  Old AICD explanted and 6949 lead capped  RV lead  Medtronic 5076 [id number]  LV lead  Medtronic 4194  [id number] V Medtronic biventricular pacer  PVX [id number]   . Diabetes mellitus without complication ([place])   . GERD (gastroesophageal reflux disease)   . History of duodenal ulcer   . History of esophageal  stricture   . Hyperlipidemia   . Hypertension   . Occlusion and stenosis of carotid artery without mention of cerebral infarction   . Peripheral neuropathy   . Right groin pain 12/12/2014  . Secondary cardiomyopathy, unspecified   . Transient global amnesia     Patient Active Problem List   Diagnosis Date Noted  . Recurrent syncope 11/28/2018  . Syncope and collapse 09/09/2018  . ASHD (arteriosclerotic heart disease) 12/05/2014  . GERD (gastroesophageal reflux disease) 12/05/2014  . Prediabetes 05/21/2014  . Vitamin D deficiency 05/20/2014  . Medication management 05/20/2014  . CKD (chronic kidney disease), stage III ([place]) 06/21/2013  . History of vasodepressor syncope   . Biventricular cardiac pacemaker in situ   . Mixed hyperlipidemia   . Hypertension   . LBBB   . Chronic systolic heart failure ([place])   . Dilated cardiomyopathy, nonischemic     Past Surgical History:  Procedure Laterality Date  . ABDOMINAL HYSTERECTOMY    . APPENDECTOMY    . BI-VENTRICULAR PACEMAKER UPGRADE N/A 05/05/2013   Procedure: BI-VENTRICULAR PACEMAKER UPGRADE;  Surgeon: Evans Lance, MD;  Location: [facility] CATH LAB;  Service: Cardiovascular;  Laterality: N/A;  . BIV PACEMAKER GENERATOR CHANGE OUT  05/05/2013   MDT CRTP upgrade (previously implanted dual chamber ICD) by Dr Lovena Le.  New RV/LV leads placed.   Marland Kitchen BREAST SURGERY Left 1985   Negative  Biopsy  . CARDIAC CATHETERIZATION  04/2003  . CARDIAC DEFIBRILLATOR PLACEMENT  09/2004   MDT ICD implanted for primary prevention; device upgrade to CRTP in 04-2013  . CARPAL TUNNEL RELEASE    . CHOLECYSTECTOMY       OB History   No obstetric history on file.      Home Medications    Prior to Admission medications   Medication Sig Start Date End Date Taking? Authorizing Provider  aspirin EC 81 MG tablet Take 81 mg by mouth daily.    [provider]  b complex vitamins capsule Take 1 capsule by mouth daily.     [provider]    bumetanide (BUMEX) 1 MG tablet TAKE 1/2 TABLET BY MOUTH AS NEEDED FOR SWELLING Patient taking differently: Take 0.5 mg by mouth daily as needed (Swelling).  07/08/18   Unk Pinto, MD  carvedilol (COREG) 6.25 MG tablet Take 1 tablet (6.25 mg total) by mouth 2 (two) times daily with a meal. 11/09/18   Unk Pinto, MD  Cholecalciferol (VITAMIN D) 2000 units CAPS Take 4,000 Units by mouth daily.     [provider]  clopidogrel (PLAVIX) 75 MG tablet TAKE 1 TABLET BY MOUTH EVERY DAY TO PREVENT HEART ATTACK Patient taking differently: Take 75 mg by mouth daily.  10/08/18   Unk Pinto, MD  famotidine (PEPCID) 20 MG tablet Take 1  Tablet 1 or 2 x / day if needed for Indigestion and Heartburn 11/21/18   Unk Pinto, MD  gabapentin (NEURONTIN) 600 MG tablet TAKE 1 TABLET BY MOUTH EVERY EVENING AT BEDTIME Patient taking differently: Take 600 mg by mouth every evening.  06/01/18   Unk Pinto, MD  levETIRAcetam (KEPPRA) 250 MG tablet Take 1 tablet (250 mg total) by mouth 2 (two) times daily. 10/14/18   Dohmeier, Asencion Partridge, MD  losartan (COZAAR) 50 MG tablet Take 1 tablet 2 x/day  for blood pressure. Patient taking differently: Take 50 mg by mouth 2 (two) times daily.  11/09/18   Unk Pinto, MD  magnesium gluconate (MAGONATE) 500 MG tablet Take 500 mg by mouth daily.     [provider]  ranitidine (ZANTAC) 300 MG tablet TAKE 1 TABLET TWICE A DAY FOR REFLUX Patient taking differently: Take 300 mg by mouth 2 (two) times daily.  06/01/18   Unk Pinto, MD    Family History Family History  Problem Relation Age of Onset  . Dementia Mother   . Diabetes Mother   . Heart Problems Father   . Heart attack Son   . Heart disease Son   . Heart disease Brother   . Heart attack Brother   . Cancer Maternal Grandmother        breast  . Stroke Daughter   . Hypertension Daughter   . Hyperlipidemia Daughter   . Diabetes Daughter   . Diabetes Sister   . Hypertension  Sister   . Hyperlipidemia Sister   . Hypertension Sister   . Hyperlipidemia Sister   . Cancer Sister        Breast  . Heart Problems Sister     Social History Social History   Tobacco Use  . Smoking status: Passive Smoke Exposure - Never Smoker  . Smokeless tobacco: Never Used  . Tobacco comment: worked at [employer] Topics  . Alcohol use: No    Alcohol/week: 0.0 standard drinks  . Drug use: No     Allergies   Nexium [esomeprazole magnesium]   Review of Systems Review of Systems  All  other systems reviewed and are negative.    Physical Exam Updated Vital Signs BP (!) 141/55   Pulse 62   Temp 97.8 F (36.6 C) (Oral)   Resp 17   SpO2 97%   Physical Exam Vitals signs and nursing note reviewed.  Constitutional:      Appearance: Normal appearance. She is well-developed. She is not ill-appearing or diaphoretic.     Comments: Elderly, frail, cooperates with exam  HENT:     Head: Normocephalic and atraumatic.  Eyes:     Conjunctiva/sclera: Conjunctivae normal.     Pupils: Pupils are equal, round, and reactive to light.  Neck:     Musculoskeletal: Normal range of motion and neck supple.     Trachea: Phonation normal.  Cardiovascular:     Rate and Rhythm: Normal rate and regular rhythm.  Pulmonary:     Effort: Pulmonary effort is normal.     Breath sounds: Normal breath sounds.  Chest:     Chest wall: No tenderness.  Abdominal:     General: There is no distension.     Palpations: Abdomen is soft.     Tenderness: There is no abdominal tenderness. There is no guarding.  Musculoskeletal: Normal range of motion.     Comments: 1-2+ lower leg edema bilaterally.  Legs are nontender to palpation.  Skin:    General: Skin is warm and dry.  Neurological:     Mental Status: She is alert.     Motor: No abnormal muscle tone.  Psychiatric:        Mood and Affect: Mood normal.        Behavior: Behavior normal.      ED Treatments / Results   Labs (all labs ordered are listed, but only abnormal results are displayed) Labs Reviewed  BASIC METABOLIC PANEL - Abnormal; Notable for the following components:      Result Value   Glucose, Bld 146 (*)    Creatinine, Ser 1.22 (*)    GFR calc non Af Amer 39 (*)    GFR calc Af Amer 45 (*)    All other components within normal limits  CBC WITH DIFFERENTIAL/PLATELET - Abnormal; Notable for the following components:   WBC 10.6 (*)    All other components within normal limits  URINALYSIS, ROUTINE W REFLEX MICROSCOPIC    EKG EKG Interpretation  Date/Time:  Friday November 27 2018 19:38:11 EST Ventricular Rate:  67 PR Interval:    QRS Duration: 143 QT Interval:  485 QTC Calculation: 513 R Axis:   -78 Text Interpretation:  Sinus rhythm IVCD, consider atypical RBBB LVH with secondary repolarization abnormality Anterior infarct, old since last tracing no significant change Confirmed by Daleen Bo [phone]) on 11/27/2018 7:43:53 PM   Radiology Dg Chest 2 View  Result Date: 11/28/2018 CLINICAL DATA:  Syncope. EXAM: CHEST - 2 VIEW COMPARISON:  11/11/2018 FINDINGS: Minimal motion degradation involves the lateral view. Pacer/AICD device. Patient rotated minimally left. Mild cardiomegaly. Atherosclerosis in the transverse aorta. No pleural effusion or pneumothorax. Left base scarring or subsegmental atelectasis. No congestive failure. Clear right lung. IMPRESSION: 1. No acute cardiopulmonary disease. 2. Cardiomegaly without congestive failure. 3.  Aortic Atherosclerosis (ICD10-I70.0). Electronically Signed   By: Abigail Miyamoto M.D.   On: 11/28/2018 00:59    Procedures Procedures (including critical care time)  Medications Ordered in ED Medications  0.9 %  sodium chloride infusion (has no administration in time range)     Initial Impression / Assessment and Plan / ED  Course  I have reviewed the triage vital signs and the nursing notes.  Pertinent labs & imaging results that were  available during my care of the patient were reviewed by me and considered in my medical decision making (see chart for details).  Clinical Course as of Nov 29 107  Sat Nov 28, 2018  0011 Normal except glucose high, creatinine high, GFR low  Basic metabolic panel(!) [EW]  0093 Normal except white count high  CBC with Differential(!) [EW]  0042 No CHF or infiltrate, images reviewed by me  DG Chest 2 View [EW]    Clinical Course User Index [EW] Daleen Bo, MD     Patient Vitals for the past 24 hrs:  BP Temp Temp src Pulse Resp SpO2  11/28/18 0015 (!) 141/55 - - 62 17 97 %  11/28/18 0000 (!) 133/45 - - 77 17 96 %  11/27/18 2245 (!) 122/47 - - 64 17 95 %  11/27/18 2230 (!) 130/55 - - 65 17 96 %  11/27/18 2200 (!) 129/49 - - 65 17 96 %  11/27/18 2100 (!) 158/63 - - 67 18 99 %  11/27/18 2030 (!) 127/102 - - 70 17 96 %  11/27/18 2015 130/76 - - 69 19 96 %  11/27/18 1945 (!) 128/95 - - 60 17 99 %  11/27/18 1938 (!) 129/46 97.8 F (36.6 C) Oral 70 20 98 %    12:42 AM Reevaluation with update and discussion. After initial assessment and treatment, an updated evaluation reveals no change in status.  Findings discussed with patient's family members, all questions answered. Daleen Bo   Medical Decision Making: Patient with psychiatric disease, recently hospitalized and started on new medications.  During hospitalization found to have significant aortic stenosis is a new finding.  She is likely very susceptible to changes in fluid status which may be contributing to her syncope.  Patient with peripheral edema, nonspecific, felt to be related to recent prolonged hospitalization.  CRITICAL CARE-no Performed by: Daleen Bo  Nursing Notes Reviewed/ Care Coordinated Applicable Imaging Reviewed Interpretation of Laboratory Data incorporated into ED treatment   12:51 AM-Consult complete with hospitalist. Patient case explained and discussed.  He agrees to admit patient for further  evaluation and treatment. Call ended at 1:07 AM   Final Clinical Impressions(s) / ED Diagnoses   Final diagnoses:  Syncope, unspecified syncope type  Aortic valve stenosis, etiology of cardiac valve disease unspecified    ED Discharge Orders    None       Daleen Bo, MD 11/28/18 [phone]

## 2018-11-27 NOTE — ED Notes (Signed)
ED Provider at bedside. 

## 2018-11-28 ENCOUNTER — Encounter (HOSPITAL_COMMUNITY): Payer: Self-pay | Admitting: Family Medicine

## 2018-11-28 ENCOUNTER — Emergency Department (HOSPITAL_COMMUNITY): Payer: Medicare Other

## 2018-11-28 DIAGNOSIS — R55 Syncope and collapse: Secondary | ICD-10-CM | POA: Diagnosis not present

## 2018-11-28 DIAGNOSIS — I5022 Chronic systolic (congestive) heart failure: Secondary | ICD-10-CM | POA: Diagnosis not present

## 2018-11-28 DIAGNOSIS — F418 Other specified anxiety disorders: Secondary | ICD-10-CM | POA: Diagnosis not present

## 2018-11-28 DIAGNOSIS — N183 Chronic kidney disease, stage 3 (moderate): Secondary | ICD-10-CM

## 2018-11-28 DIAGNOSIS — I350 Nonrheumatic aortic (valve) stenosis: Secondary | ICD-10-CM

## 2018-11-28 DIAGNOSIS — I10 Essential (primary) hypertension: Secondary | ICD-10-CM

## 2018-11-28 LAB — URINALYSIS, ROUTINE W REFLEX MICROSCOPIC
Bilirubin Urine: NEGATIVE
Glucose, UA: NEGATIVE mg/dL
Hgb urine dipstick: NEGATIVE
Ketones, ur: NEGATIVE mg/dL
Leukocytes, UA: NEGATIVE
Nitrite: NEGATIVE
Protein, ur: NEGATIVE mg/dL
pH: 5.5 (ref 5.0–8.0)

## 2018-11-28 LAB — GLUCOSE, CAPILLARY: Glucose-Capillary: 95 mg/dL (ref 70–99)

## 2018-11-28 MED ORDER — CLOPIDOGREL BISULFATE 75 MG PO TABS
75.00 | ORAL_TABLET | ORAL | Status: DC
Start: 2018-11-28 — End: 2018-11-28

## 2018-11-28 MED ORDER — FAMOTIDINE 20 MG PO TABS
20.0000 mg | ORAL_TABLET | Freq: Every day | ORAL | Status: DC
  Administered 2018-11-28: 20 mg via ORAL
  Filled 2018-11-28: qty 1

## 2018-11-28 MED ORDER — ACETAMINOPHEN 650 MG RE SUPP: 650.0000 mg | Freq: Four times a day (QID) | RECTAL | Status: DC | PRN

## 2018-11-28 MED ORDER — POLYETHYLENE GLYCOL 3350 17 G PO PACK: 17.0000 g | PACK | Freq: Every day | ORAL | Status: DC | PRN

## 2018-11-28 MED ORDER — ACETAMINOPHEN 325 MG PO TABS: 650.0000 mg | ORAL_TABLET | Freq: Four times a day (QID) | ORAL | Status: DC | PRN

## 2018-11-28 MED ORDER — DOCUSATE SODIUM 100 MG PO CAPS
100.0000 mg | ORAL_CAPSULE | Freq: Every day | ORAL | Status: DC
  Administered 2018-11-28: 100 mg via ORAL
  Filled 2018-11-28: qty 1

## 2018-11-28 MED ORDER — SODIUM CHLORIDE 0.9% FLUSH
3.0000 mL | Freq: Two times a day (BID) | INTRAVENOUS | Status: DC
  Administered 2018-11-28: 3 mL via INTRAVENOUS

## 2018-11-28 MED ORDER — HYDROCODONE-ACETAMINOPHEN 5-325 MG PO TABS: 1.0000 | ORAL_TABLET | ORAL | Status: DC | PRN

## 2018-11-28 MED ORDER — ASPIRIN EC 81 MG PO TBEC
81.0000 mg | DELAYED_RELEASE_TABLET | Freq: Every day | ORAL | Status: DC
  Administered 2018-11-28: 81 mg via ORAL
  Filled 2018-11-28: qty 1

## 2018-11-28 MED ORDER — GABAPENTIN 600 MG PO TABS
600.0000 mg | ORAL_TABLET | Freq: Every day | ORAL | Status: DC
  Filled 2018-11-28: qty 1

## 2018-11-28 MED ORDER — HYDROCODONE-ACETAMINOPHEN 5-325 MG PO TABS: 1.0000 | ORAL_TABLET | ORAL | 0 refills | Status: AC | PRN

## 2018-11-28 MED ORDER — HEPARIN SODIUM (PORCINE) 5000 UNIT/ML IJ SOLN
5000.0000 [IU] | Freq: Three times a day (TID) | INTRAMUSCULAR | Status: DC
  Filled 2018-11-28 (×2): qty 1

## 2018-11-28 MED ORDER — LORAZEPAM 0.5 MG PO TABS: 0.5000 mg | ORAL_TABLET | Freq: Four times a day (QID) | ORAL | 0 refills | Status: DC | PRN

## 2018-11-28 MED ORDER — ENSURE ENLIVE PO LIQD: 237.0000 mL | Freq: Two times a day (BID) | ORAL | Status: DC

## 2018-11-28 MED ORDER — SODIUM CHLORIDE 0.9 % IV SOLN
INTRAVENOUS | Status: DC
  Administered 2018-11-28: 02:00:00 via INTRAVENOUS

## 2018-11-28 MED ORDER — SODIUM CHLORIDE 0.9% FLUSH: 3.0000 mL | INTRAVENOUS | Status: DC | PRN

## 2018-11-28 MED ORDER — SODIUM CHLORIDE 0.9 % IV SOLN: 250.0000 mL | INTRAVENOUS | Status: DC | PRN

## 2018-11-28 MED ORDER — METOPROLOL TARTRATE 25 MG PO TABS
12.50 | ORAL_TABLET | ORAL | Status: DC
Start: 2018-11-27 — End: 2018-11-28

## 2018-11-28 MED ORDER — LOSARTAN POTASSIUM 25 MG PO TABS: 25.0000 mg | ORAL_TABLET | Freq: Every day | ORAL | Status: DC

## 2018-11-28 MED ORDER — LEVETIRACETAM 250 MG PO TABS
250.0000 mg | ORAL_TABLET | Freq: Two times a day (BID) | ORAL | Status: DC
  Administered 2018-11-28: 250 mg via ORAL
  Filled 2018-11-28 (×2): qty 1

## 2018-11-28 MED ORDER — DOCUSATE SODIUM 100 MG PO CAPS
100.00 | ORAL_CAPSULE | ORAL | Status: DC
Start: 2018-11-28 — End: 2018-11-28

## 2018-11-28 MED ORDER — CLOPIDOGREL BISULFATE 75 MG PO TABS
75.0000 mg | ORAL_TABLET | Freq: Every day | ORAL | Status: DC
  Administered 2018-11-28: 75 mg via ORAL
  Filled 2018-11-28: qty 1

## 2018-11-28 MED ORDER — METOPROLOL TARTRATE 25 MG PO TABS
25.0000 mg | ORAL_TABLET | Freq: Two times a day (BID) | ORAL | Status: DC
  Administered 2018-11-28: 25 mg via ORAL
  Filled 2018-11-28: qty 1

## 2018-11-28 MED ORDER — LEVETIRACETAM 500 MG PO TABS
250.00 | ORAL_TABLET | ORAL | Status: DC
Start: 2018-11-27 — End: 2018-11-28

## 2018-11-28 MED ORDER — FAMOTIDINE 20 MG PO TABS
20.00 | ORAL_TABLET | ORAL | Status: DC
Start: 2018-11-27 — End: 2018-11-28

## 2018-11-28 MED ORDER — BUPROPION HCL ER (SR) 100 MG PO TB12
100.00 | ORAL_TABLET | ORAL | Status: DC
Start: 2018-11-28 — End: 2018-11-28

## 2018-11-28 MED ORDER — BUPROPION HCL ER (SR) 100 MG PO TB12
100.0000 mg | ORAL_TABLET | Freq: Every day | ORAL | Status: DC
  Administered 2018-11-28: 100 mg via ORAL
  Filled 2018-11-28: qty 1

## 2018-11-28 MED ORDER — ESCITALOPRAM OXALATE 20 MG PO TABS
20.00 | ORAL_TABLET | ORAL | Status: DC
Start: 2018-11-28 — End: 2018-11-28

## 2018-11-28 MED ORDER — ESCITALOPRAM OXALATE 10 MG PO TABS
20.0000 mg | ORAL_TABLET | Freq: Every day | ORAL | Status: DC
  Administered 2018-11-28: 20 mg via ORAL
  Filled 2018-11-28: qty 2

## 2018-11-28 MED ORDER — MIRTAZAPINE 7.5 MG PO TABS: 7.5000 mg | ORAL_TABLET | Freq: Every day | ORAL | 0 refills | Status: DC

## 2018-11-28 MED ORDER — ACETAMINOPHEN 325 MG PO TABS
650.00 | ORAL_TABLET | ORAL | Status: DC
Start: ? — End: 2018-11-28

## 2018-11-28 MED ORDER — LOSARTAN POTASSIUM 25 MG PO TABS
25.00 | ORAL_TABLET | ORAL | Status: DC
Start: 2018-11-27 — End: 2018-11-28

## 2018-11-28 MED ORDER — ONDANSETRON HCL 4 MG/2ML IJ SOLN: 4.0000 mg | Freq: Four times a day (QID) | INTRAMUSCULAR | Status: DC | PRN

## 2018-11-28 MED ORDER — GABAPENTIN 300 MG PO CAPS
600.00 | ORAL_CAPSULE | ORAL | Status: DC
Start: 2018-11-27 — End: 2018-11-28

## 2018-11-28 MED ORDER — GENERIC EXTERNAL MEDICATION
1.00 | Status: DC
Start: 2018-11-28 — End: 2018-11-28

## 2018-11-28 MED ORDER — ONDANSETRON HCL 4 MG PO TABS: 4.0000 mg | ORAL_TABLET | Freq: Four times a day (QID) | ORAL | Status: DC | PRN

## 2018-11-28 MED ORDER — ASPIRIN EC 81 MG PO TBEC
81.00 | DELAYED_RELEASE_TABLET | ORAL | Status: DC
Start: 2018-11-28 — End: 2018-11-28

## 2018-11-28 NOTE — Discharge Instructions (Signed)
Follow with Primary MD Unk Pinto, MD in 7 days    Activity: As tolerated with Full fall precautions use walker/cane & assistance as needed   Disposition Home    Diet: Heart Healthy , with feeding assistance and aspiration precautions.  For Heart failure patients - Check your Weight same time everyday, if you gain over 2 pounds, or you develop in leg swelling, experience more shortness of breath or chest pain, call your Primary MD immediately. Follow Cardiac Low Salt Diet and 1.5 lit/day fluid restriction.   On your next visit with your primary care physician please Get Medicines reviewed and adjusted.   Please request your Prim.MD to go over all Hospital Tests and Procedure/Radiological results at the follow up, please get all Hospital records sent to your Prim MD by signing hospital release before you go home.   If you experience worsening of your admission symptoms, develop shortness of breath, life threatening emergency, suicidal or homicidal thoughts you must seek medical attention immediately by calling 911 or calling your MD immediately  if symptoms less severe.  You Must read complete instructions/literature along with all the possible adverse reactions/side effects for all the Medicines you take and that have been prescribed to you. Take any new Medicines after you have completely understood and accpet all the possible adverse reactions/side effects.   Do not drive, operating heavy machinery, perform activities at heights, swimming or participation in water activities or provide baby sitting services if your were admitted for syncope or siezures until you have seen by Primary MD or a Neurologist and advised to do so again.  Do not drive when taking Pain medications.    Do not take more than prescribed Pain, Sleep and Anxiety Medications  Special Instructions: If you have smoked or chewed Tobacco  in the last 2 yrs please stop smoking, stop any regular Alcohol  and or any  Recreational drug use.  Wear Seat belts while driving.   Please note  You were cared for by a hospitalist during your hospital stay. If you have any questions about your discharge medications or the care you received while you were in the hospital after you are discharged, you can call the unit and asked to speak with the hospitalist on call if the hospitalist that took care of you is not available. Once you are discharged, your primary care physician will handle any further medical issues. Please note that NO REFILLS for any discharge medications will be authorized once you are discharged, as it is imperative that you return to your primary care physician (or establish a relationship with a primary care physician if you do not have one) for your aftercare needs so that they can reassess your need for medications and monitor your lab values.

## 2018-11-28 NOTE — Progress Notes (Signed)
Patient was discharged home with hospice by MD order; discharged instructions  review and give to patient and her daughter Myrtie Hawk) with care notes and prescriptions; IV DIC; patient will be escorted to the car by nurse tech via wheelchair.

## 2018-11-28 NOTE — H&P (Signed)
History and Physical    [name] DQQ:[id number] DOB: August 21, 1925 DOA: 11/27/2018  PCP: Unk Pinto, MD   Patient coming from: Home   Chief Complaint: Recurrent syncope   HPI: [name] is a 84 y.o. female with medical history significant for diet-controlled diabetes mellitus, hypertension, chronic kidney disease stage III, chronic systolic CHF, aortic stenosis, and depression with anxiety, now presenting to the emergency department for evaluation of recurrent syncopal episodes.  Patient is accompanied by her daughters who assist with the history.  She has been experiencing syncopal episodes since 2012, though these have increased in frequency over the past year.  She had recent evaluation by her cardiologist with event monitor, felt that this was related to autonomic dysfunction.  She was recently evaluated by her neurologist with EEG that featured slowing, and she was started on Keppra with the thought that these could be seizures.  She was just discharged from the hospital yesterday after a 2-week admission to [facility], during which she had multiple syncopal episodes and was evaluated there with carotid ultrasound, head CT, cardiac monitoring, and transthoracic echocardiogram.  She was found to have severe aortic stenosis on her echo, per report of her daughters this was relayed to her primary cardiologist, but daughter states that she has been told she is not a candidate for valve replacement.  Patient was admitted to the outside hospital recently for suicidal ideation, but the patient denies any SI, HI, or hallucinations currently.  Daughters report that she is having paranoid delusions, believes that ED personnel have stolen her close despite them showing the patient her close at the bedside.  Daughters report they are unable to care for her at home due to the frequent syncopal episodes with high fall risk and intensive supervision that is required to avoid  injury.  ED Course: Upon arrival to the ED, patient is found to be afebrile, saturating well on room air, and with vitals otherwise normal.  EKG features a sinus rhythm with IVCD and LVH with repolarization abnormality.  Chest x-ray is negative for acute cardiopulmonary disease.  Chemistry panel is notable for creatinine 1.22, similar to priors.  CBC features a slight leukocytosis.  Patient was given IV fluids in the emergency department and the hospitalist were asked to admit for evaluation of recurrent syncopal episodes.  Review of Systems:  All other systems reviewed and apart from HPI, are negative.  Past Medical History:  Diagnosis Date  . A-fib ([place])   . Biventricular cardiac pacemaker in situ    05/06/13  Old AICD explanted and 6949 lead capped  RV lead  Medtronic 5076 [id number]  LV lead  Medtronic 4194  [id number] V Medtronic biventricular pacer  PVX [id number]   . Diabetes mellitus without complication ([place])   . GERD (gastroesophageal reflux disease)   . History of duodenal ulcer   . History of esophageal stricture   . Hyperlipidemia   . Hypertension   . Occlusion and stenosis of carotid artery without mention of cerebral infarction   . Peripheral neuropathy   . Right groin pain 12/12/2014  . Secondary cardiomyopathy, unspecified   . Transient global amnesia     Past Surgical History:  Procedure Laterality Date  . ABDOMINAL HYSTERECTOMY    . APPENDECTOMY    . BI-VENTRICULAR PACEMAKER UPGRADE N/A 05/05/2013   Procedure: BI-VENTRICULAR PACEMAKER UPGRADE;  Surgeon: Evans Lance, MD;  Location: [facility] C[facility] LAB;  Service: Cardiovascular;  Laterality: N/A;  . BIV PACEMAKER GENERATOR CHANGE OUT  05/05/2013   MDT CRTP upgrade (previously implanted dual chamber ICD) by Dr Lovena Le.  New RV/LV leads placed.   Marland Kitchen BREAST SURGERY Left 1985   Negative  Biopsy  . CARDIAC CATHETERIZATION     04/2003  . CARDIAC DEFIBRILLATOR PLACEMENT  09/2004   MDT ICD implanted for primary prevention; device upgrade  to CRTP in 04-2013  . CARPAL TUNNEL RELEASE    . CHOLECYSTECTOMY       reports that she is a non-smoker but has been exposed to tobacco smoke. She has never used smokeless tobacco. She reports that she does not drink alcohol or use drugs.  Allergies  Allergen Reactions  . Nexium [Esomeprazole Magnesium] Diarrhea    Family History  Problem Relation Age of Onset  . Dementia Mother   . Diabetes Mother   . Heart Problems Father   . Heart attack Son   . Heart disease Son   . Heart disease Brother   . Heart attack Brother   . Cancer Maternal Grandmother        breast  . Stroke Daughter   . Hypertension Daughter   . Hyperlipidemia Daughter   . Diabetes Daughter   . Diabetes Sister   . Hypertension Sister   . Hyperlipidemia Sister   . Hypertension Sister   . Hyperlipidemia Sister   . Cancer Sister        Breast  . Heart Problems Sister      Prior to Admission medications   Medication Sig Start Date End Date Taking? Authorizing Provider  aspirin EC 81 MG tablet Take 81 mg by mouth daily.   Yes [provider]  buPROPion (WELLBUTRIN SR) 100 MG 12 hr tablet Take 100 mg by mouth every morning.   Yes [provider]  clopidogrel (PLAVIX) 75 MG tablet TAKE 1 TABLET BY MOUTH EVERY DAY TO PREVENT HEART ATTACK Patient taking differently: Take 75 mg by mouth daily.  10/08/18  Yes Unk Pinto, MD  docusate sodium (COLACE) 100 MG capsule Take 100 mg by mouth every morning.   Yes [provider]  escitalopram (LEXAPRO) 20 MG tablet Take 20 mg by mouth every morning.   Yes [provider]  famotidine (PEPCID) 20 MG tablet Take 1  Tablet 1 or 2 x / day if needed for Indigestion and Heartburn Patient taking differently: Take 20 mg by mouth 2 (two) times daily.  11/21/18  Yes Unk Pinto, MD  gabapentin (NEURONTIN) 600 MG tablet TAKE 1 TABLET BY MOUTH EVERY EVENING AT BEDTIME Patient taking differently: Take 600 mg by mouth every evening.  06/01/18   Yes Unk Pinto, MD  levETIRAcetam (KEPPRA) 250 MG tablet Take 1 tablet (250 mg total) by mouth 2 (two) times daily. 10/14/18  Yes Dohmeier, Asencion Partridge, MD  losartan (COZAAR) 50 MG tablet Take 1 tablet 2 x/day  for blood pressure. Patient taking differently: Take 25 mg by mouth at bedtime.  11/09/18  Yes Unk Pinto, MD  magnesium gluconate (MAGONATE) 500 MG tablet Take 50 mg by mouth 2 (two) times daily.    Yes [provider]  metoprolol tartrate (LOPRESSOR) 25 MG tablet Take 25 mg by mouth 2 (two) times daily.   Yes [provider]    Physical Exam: Vitals:   11/27/18 2245 11/28/18 0000 11/28/18 0015 11/28/18 0215  BP: (!) 122/47 (!) 133/45 (!) 141/55 119/90  Pulse: 64 77 62 71  Resp: 17 17 17    Temp:      TempSrc:  SpO2: 95% 96% 97% 99%    Constitutional: NAD, cantankerous  Eyes: PERTLA, lids and conjunctivae normal ENMT: Mucous membranes are moist. Posterior pharynx clear of any exudate or lesions.   Neck: normal, supple, no masses, no thyromegaly Respiratory: clear to auscultation bilaterally, no wheezing, no crackles. Normal respiratory effort.    Cardiovascular: S1 & S2 heard, regular rate and rhythm. Trace pretibial edema bilaterally. Abdomen: No distension, no tenderness, soft. Bowel sounds active.  Musculoskeletal: no clubbing / cyanosis. No joint deformity upper and lower extremities.    Skin: no significant rashes, lesions, ulcers. Warm, dry, well-perfused. Neurologic: No facial asymmetry. Sensation intact. Moving all extremities.  Psychiatric: Alert, but not cooperative with interview.     Labs on Admission: I have personally reviewed following labs and imaging studies  CBC: Recent Labs  Lab 11/27/18 2311  WBC 10.6*  NEUTROABS 6.9  HGB 12.3  HCT 37.2  MCV 93.7  PLT 962   Basic Metabolic Panel: Recent Labs  Lab 11/27/18 2311  NA 140  K 4.1  CL 107  CO2 22  GLUCOSE 146*  BUN 23  CREATININE 1.22*  CALCIUM 9.2   GFR: CrCl  cannot be calculated (Unknown ideal weight.). Liver Function Tests: No results for input(s): AST, ALT, ALKPHOS, BILITOT, PROT, ALBUMIN in the last 168 hours. No results for input(s): LIPASE, AMYLASE in the last 168 hours. No results for input(s): AMMONIA in the last 168 hours. Coagulation Profile: No results for input(s): INR, PROTIME in the last 168 hours. Cardiac Enzymes: No results for input(s): CKTOTAL, CKMB, CKMBINDEX, TROPONINI in the last 168 hours. BNP (last 3 results) No results for input(s): PROBNP in the last 8760 hours. HbA1C: No results for input(s): HGBA1C in the last 72 hours. CBG: No results for input(s): GLUCAP in the last 168 hours. Lipid Profile: No results for input(s): CHOL, HDL, LDLCALC, TRIG, CHOLHDL, LDLDIRECT in the last 72 hours. Thyroid Function Tests: No results for input(s): TSH, T4TOTAL, FREET4, T3FREE, THYROIDAB in the last 72 hours. Anemia Panel: No results for input(s): VITAMINB12, FOLATE, FERRITIN, TIBC, IRON, RETICCTPCT in the last 72 hours. Urine analysis:    Component Value Date/Time   COLORURINE YELLOW 11/28/2018 0123   APPEARANCEUR HAZY (A) 11/28/2018 0123   LABSPEC >1.030 (H) 11/28/2018 0123   PHURINE 5.5 11/28/2018 0123   GLUCOSEU NEGATIVE 11/28/2018 0123   HGBUR NEGATIVE 11/28/2018 0123   BILIRUBINUR NEGATIVE 11/28/2018 0123   KETONESUR NEGATIVE 11/28/2018 0123   PROTEINUR NEGATIVE 11/28/2018 0123   UROBILINOGEN 0.2 07/21/2014 1540   NITRITE NEGATIVE 11/28/2018 0123   LEUKOCYTESUR NEGATIVE 11/28/2018 0123   Sepsis Labs: @LABRCNTIP (procalcitonin:4,lacticidven:4) )No results found for this or any previous visit (from the past 240 hour(s)).   Radiological Exams on Admission: Dg Chest 2 View  Result Date: 11/28/2018 CLINICAL DATA:  Syncope. EXAM: CHEST - 2 VIEW COMPARISON:  11/11/2018 FINDINGS: Minimal motion degradation involves the lateral view. Pacer/AICD device. Patient rotated minimally left. Mild cardiomegaly. Atherosclerosis in  the transverse aorta. No pleural effusion or pneumothorax. Left base scarring or subsegmental atelectasis. No congestive failure. Clear right lung. IMPRESSION: 1. No acute cardiopulmonary disease. 2. Cardiomegaly without congestive failure. 3.  Aortic Atherosclerosis (ICD10-I70.0). Electronically Signed   By: Abigail Miyamoto M.D.   On: 11/28/2018 00:59    EKG: Independently reviewed. Sinus rhythm, non-specific IVCD, LVH with repolarization abnormality.   Assessment/Plan   1. Recurrent syncope  - Presents with daughters for evaluation of recurrent syncopal episodes  - She was evaluated by cardiology with event monitor,  felt that this is autonomic dysfunction and recommended avoiding dehydration and letting BP run high  - She was evaluated by neurology with EEG that featured slowing; she started on Keppra in case these are seizures and is planned for a prolonged EEG in January but episodes have increased in frequency despite Keppra  - While admitted to [facility] over the past 2 weeks, she had TTE with severe aortic stenosis (cardiologist aware but deemed not candidate for valve replacement per daughter) and carotid US without hemodynamically significant stenosis  - She was treated with IVF in ED  - Continue cardiac monitoring    2. Depression, anxiety  - Just discharged on 12/20 after admission for depression with SI  - She adamantly denies SI, HI, or hallucinations  - Continue Lexapro and Wellbutrin   3. CKD stage III  - SCr is 1.22, similar to priors  - Renally-dose medications    4. Chronic systolic CHF  - Appears compensated  - TTE (11/17/2018) with EF 40-45%, severe AS, mild AI  - Continue beta-blocker and ARB, follow daily wts    5. Hypertension  - BP at goal, cardiology wants to run high given her suspected autonomic dysfunction with recurrent syncopal episodes  - Continue Lopressor and losartan as tolerated    6. Social  - Patient discharged from [facility] 12/20 after admission for depression and SI, family is unable to care for her at home d/t recurrent syncopal episodes and falls requiring intensive supervision  - Social work consulted at family request for possible placement    DVT prophylaxis: sq heparin  Code Status: DNR; confirmed with daughter (POA) at bedside  Family Communication: Daughters updated at bedside Consults called: None Admission status: Observation     Vianne Bulls, MD [facility] Pager [phone]  If 7PM-7AM, please contact night-coverage [email or web address] Password [facility]  11/28/2018, 2:47 AM

## 2018-11-28 NOTE — Discharge Summary (Signed)
[name], is a 84 y.o. female  DOB Sep 29, 1925  MRN [id number].  Admission date:  11/27/2018  Admitting Physician  Vianne Bulls, MD  Discharge Date:  11/28/2018   Primary MD  Unk Pinto, MD  Recommendations for primary care physician for things to follow:  -Further management per hospital service at home   Admission Diagnosis  Syncope, unspecified syncope type [R55] Aortic valve stenosis, etiology of cardiac valve disease unspecified [I35.0]   Discharge Diagnosis  Syncope, unspecified syncope type [R55] Aortic valve stenosis, etiology of cardiac valve disease unspecified [I35.0]    Principal Problem:   Recurrent syncope Active Problems:   Chronic systolic heart failure ([place])   Hypertension   CKD (chronic kidney disease), stage III ([place])   Depression with anxiety      Past Medical History:  Diagnosis Date  . A-fib ([place])   . Biventricular cardiac pacemaker in situ    05/06/13  Old AICD explanted and 6949 lead capped  RV lead  Medtronic 5076 [id number]  LV lead  Medtronic 4194  [id number] Medtronic biventricular pacer  PVX [id number]   . Diabetes mellitus without complication ([place])   . GERD (gastroesophageal reflux disease)   . History of duodenal ulcer   . History of esophageal stricture   . Hyperlipidemia   . Hypertension   . Occlusion and stenosis of carotid artery without mention of cerebral infarction   . Peripheral neuropathy   . Right groin pain 12/12/2014  . Secondary cardiomyopathy, unspecified   . Transient global amnesia     Past Surgical History:  Procedure Laterality Date  . ABDOMINAL HYSTERECTOMY    . APPENDECTOMY    . BI-VENTRICULAR PACEMAKER UPGRADE N/A 05/05/2013   Procedure: BI-VENTRICULAR PACEMAKER UPGRADE;  Surgeon: Evans Lance, MD;  Location: [facility] CATH LAB;  Service: Cardiovascular;  Laterality: N/A;  . BIV PACEMAKER GENERATOR CHANGE OUT  05/05/2013   MDT  CRTP upgrade (previously implanted dual chamber ICD) by Dr Lovena Le.  New RV/LV leads placed.   Marland Kitchen BREAST SURGERY Left 1985   Negative  Biopsy  . CARDIAC CATHETERIZATION     04/2003  . CARDIAC DEFIBRILLATOR PLACEMENT  09/2004   MDT ICD implanted for primary prevention; device upgrade to CRTP in 04-2013  . CARPAL TUNNEL RELEASE    . CHOLECYSTECTOMY         History of present illness and  Hospital Course:     Kindly see H&P for history of present illness and admission details, please review complete Labs, Consult reports and Test reports for all details in brief  HPI  from the history and physical done on the day of admission 11/28/2018  HPI: [name] is a 84 y.o. female with medical history significant for diet-controlled diabetes mellitus, hypertension, chronic kidney disease stage III, chronic systolic CHF, aortic stenosis, and depression with anxiety, now presenting to the emergency department for evaluation of recurrent syncopal episodes.  Patient is accompanied by her daughters who assist with the history.  She has been experiencing syncopal  episodes since 2012, though these have increased in frequency over the past year.  She had recent evaluation by her cardiologist with event monitor, felt that this was related to autonomic dysfunction.  She was recently evaluated by her neurologist with EEG that featured slowing, and she was started on Keppra with the thought that these could be seizures.  She was just discharged from the hospital yesterday after a 2-week admission to [facility], during which she had multiple syncopal episodes and was evaluated there with carotid ultrasound, head CT, cardiac monitoring, and transthoracic echocardiogram.  She was found to have severe aortic stenosis on her echo, per report of her daughters this was relayed to her primary cardiologist, but daughter states that she has been told she is not a candidate for valve replacement.  Patient was  admitted to the outside hospital recently for suicidal ideation, but the patient denies any SI, HI, or hallucinations currently.  Daughters report that she is having paranoid delusions, believes that ED personnel have stolen her close despite them showing the patient her close at the bedside.  Daughters report they are unable to care for her at home due to the frequent syncopal episodes with high fall risk and intensive supervision that is required to avoid injury.  ED Course: Upon arrival to the ED, patient is found to be afebrile, saturating well on room air, and with vitals otherwise normal.  EKG features a sinus rhythm with IVCD and LVH with repolarization abnormality.  Chest x-ray is negative for acute cardiopulmonary disease.  Chemistry panel is notable for creatinine 1.22, similar to priors.  CBC features a slight leukocytosis.  Patient was given IV fluids in the emergency department and the hospitalist were asked to admit for evaluation of recurrent syncopal episodes.  Hospital Course    1- Recurrent syncope  - Presents with daughters for evaluation of recurrent syncopal episodes  - She was evaluated by cardiology with event monitor, felt that this is autonomic dysfunction and recommended avoiding dehydration and letting BP run high  - She was evaluated by neurology with EEG that featured slowing; she started on Keppra in case these are seizures and is planned for a prolonged EEG in January but episodes have increased in frequency despite Keppra  - While admitted to [facility] over the past 2 weeks, she had TTE with severe aortic stenosis (cardiologist aware but deemed not candidate for valve replacement per daughter) and carotid US without hemodynamically significant stenosis  -Will be given TED hose on discharge -Discussed at length with the daughter, this is a chronic recurrent problem, progressive, it is uncurable, as my discussion with the daughter, patient with very poor life  quality, I have discussed with them hospice option, and they seem to be very interested, and it is very appropriate for this patient with her multiple comorbidities which are chronic, with failure to thrive and poor life quality  2. Depression, anxiety  - Just discharged on 12/20 after admission for depression with SI  - She adamantly denies SI, HI, or hallucinations  - Continue Lexapro and Wellbutrin  -I have added Remeron after discussion with the family, given her poor appetite, and poor night sleep -will Be given prescription for Ativan as well to be provided by hospice services if it felt necessary  3. CKD stage III  - SCr is 1.22, similar to priors  - Renally-dose medications    4. Chronic systolic CHF  - Appears compensated  - TTE (11/17/2018) with EF  40-45%, severe AS, mild AI  - Continue beta-blocker and ARB, follow daily wts    5. Hypertension  - BP at goal, cardiology wants to run high given her suspected autonomic dysfunction with recurrent syncopal episodes  - Continue Lopressor and losartan as tolerated    6. Social  - Patient discharged from [facility] 12/20 after admission for depression and SI, family is unable to care for her at home d/t recurrent syncopal episodes and falls requiring intensive supervision , have discussed goals of care with the family, this point decision has been made for hospice services at home, case manager has been consulted and arranged for hospice at home    Discharge Condition:  Patient is being discharged home with hospice services   Follow UP  Follow-up Information    [facility] Follow up.   Why:  Hospice at home Contact information: [address] [place]            Discharge Instructions  and  Discharge Medications    Discharge Instructions    Discharge instructions   Complete by:  As directed    Follow with Primary MD Unk Pinto, MD in 7 days    Activity: As tolerated with Full fall precautions use walker/cane & assistance as needed   Disposition Home    Diet: Heart Healthy , with feeding assistance and aspiration precautions.  For Heart failure patients - Check your Weight same time everyday, if you gain over 2 pounds, or you develop in leg swelling, experience more shortness of breath or chest pain, call your Primary MD immediately. Follow Cardiac Low Salt Diet and 1.5 lit/day fluid restriction.   On your next visit with your primary care physician please Get Medicines reviewed and adjusted.   Please request your Prim.MD to go over all Hospital Tests and Procedure/Radiological results at the follow up, please get all Hospital records sent to your Prim MD by signing hospital release before you go home.   If you experience worsening of your admission symptoms, develop shortness of breath, life threatening emergency, suicidal or homicidal thoughts you must seek medical attention immediately by calling 911 or calling your MD immediately  if symptoms less severe.  You Must read complete instructions/literature along with all the possible adverse reactions/side effects for all the Medicines you take and that have been prescribed to you. Take any new Medicines after you have completely understood and accpet all the possible adverse reactions/side effects.   Do not drive, operating heavy machinery, perform activities at heights, swimming or participation in water activities or provide baby sitting services if your were admitted for syncope or siezures until you have seen by Primary MD or a Neurologist and advised to do so again.  Do not drive when taking Pain medications.    Do not take more than prescribed Pain, Sleep and Anxiety Medications  Special Instructions: If you have smoked or chewed Tobacco  in the last 2 yrs please stop smoking, stop any regular Alcohol  and or any Recreational drug use.  Wear  Seat belts while driving.   Please note  You were cared for by a hospitalist during your hospital stay. If you have any questions about your discharge medications or the care you received while you were in the hospital after you are discharged, you can call the unit and asked to speak with the hospitalist on call if the hospitalist that took care of you is  not available. Once you are discharged, your primary care physician will handle any further medical issues. Please note that NO REFILLS for any discharge medications will be authorized once you are discharged, as it is imperative that you return to your primary care physician (or establish a relationship with a primary care physician if you do not have one) for your aftercare needs so that they can reassess your need for medications and monitor your lab values.   Increase activity slowly   Complete by:  As directed      Allergies as of 11/28/2018      Reactions   Nexium [esomeprazole Magnesium] Diarrhea      Medication List    TAKE these medications   aspirin EC 81 MG tablet Take 81 mg by mouth daily.   buPROPion 100 MG 12 hr tablet Commonly known as:  WELLBUTRIN SR Take 100 mg by mouth every morning.   clopidogrel 75 MG tablet Commonly known as:  PLAVIX TAKE 1 TABLET BY MOUTH EVERY DAY TO PREVENT HEART ATTACK What changed:    how much to take  how to take this  when to take this  additional instructions   docusate sodium 100 MG capsule Commonly known as:  COLACE Take 100 mg by mouth every morning.   escitalopram 20 MG tablet Commonly known as:  LEXAPRO Take 20 mg by mouth every morning.   famotidine 20 MG tablet Commonly known as:  PEPCID Take 1  Tablet 1 or 2 x / day if needed for Indigestion and Heartburn What changed:    how much to take  how to take this  when to take this  additional instructions   gabapentin 600 MG tablet Commonly known as:  NEURONTIN TAKE 1 TABLET BY MOUTH EVERY EVENING AT  BEDTIME What changed:    how much to take  how to take this  when to take this  additional instructions   HYDROcodone-acetaminophen 5-325 MG tablet Commonly known as:  NORCO/VICODIN Take 1 tablet by mouth every 4 (four) hours as needed for moderate pain.   levETIRAcetam 250 MG tablet Commonly known as:  KEPPRA Take 1 tablet (250 mg total) by mouth 2 (two) times daily.   LORazepam 0.5 MG tablet Commonly known as:  ATIVAN Take 1 tablet (0.5 mg total) by mouth every 6 (six) hours as needed for anxiety.   losartan 50 MG tablet Commonly known as:  COZAAR Take 1 tablet 2 x/day  for blood pressure. What changed:    how much to take  how to take this  when to take this  additional instructions   magnesium gluconate 500 MG tablet Commonly known as:  MAGONATE Take 50 mg by mouth 2 (two) times daily.   metoprolol tartrate 25 MG tablet Commonly known as:  LOPRESSOR Take 25 mg by mouth 2 (two) times daily.   mirtazapine 7.5 MG tablet Commonly known as:  REMERON Take 1 tablet (7.5 mg total) by mouth at bedtime.         Diet and Activity recommendation: See Discharge Instructions above   Consults obtained -  None   Major procedures and Radiology Reports - PLEASE review detailed and final reports for all details, in brief -     Dg Chest 2 View  Result Date: 11/28/2018 CLINICAL DATA:  Syncope. EXAM: CHEST - 2 VIEW COMPARISON:  11/11/2018 FINDINGS: Minimal motion degradation involves the lateral view. Pacer/AICD device. Patient rotated minimally left. Mild cardiomegaly. Atherosclerosis in the transverse aorta. No pleural  effusion or pneumothorax. Left base scarring or subsegmental atelectasis. No congestive failure. Clear right lung. IMPRESSION: 1. No acute cardiopulmonary disease. 2. Cardiomegaly without congestive failure. 3.  Aortic Atherosclerosis (ICD10-I70.0). Electronically Signed   By: Abigail Miyamoto M.D.   On: 11/28/2018 00:59   Dg Chest 2 View  Result  Date: 11/11/2018 CLINICAL DATA:  Syncopal episode on the toilet today. EXAM: CHEST - 2 VIEW COMPARISON:  10/03/2018 FINDINGS: Stable cardiomegaly with tortuous atherosclerotic aorta. ICD device with leads in the right atrium, coronary sinus and right ventricle are noted as before. Left basilar atelectasis is seen. No acute pulmonary consolidation, effusion or pneumothorax. Degenerative changes are stable along the dorsal spine. No acute osseous appearing abnormality. IMPRESSION: Cardiomegaly with aortic atherosclerosis. Left basilar atelectasis. ICD device in place. Electronically Signed   By: Ashley Royalty M.D.   On: 11/11/2018 15:40    Micro Results     No results found for this or any previous visit (from the past 240 hour(s)).     Today   Subjective:   [name] today has no headache,no chest abdominal pain,no new weakness tingling or numbness, feels much better wants to go home today.  He denies any suicidal thoughts or ideations  Objective:   Blood pressure (!) 155/83, pulse 68, temperature (!) 97.1 F (36.2 C), temperature source Oral, resp. rate 17, weight 66.7 kg, SpO2 97 %.   Intake/Output Summary (Last 24 hours) at 11/28/2018 1350 Last data filed at 11/28/2018 0929 Gross per 24 hour  Intake 414.58 ml  Output -  Net 414.58 ml    Exam Awake Alert, Oriented x 3, No new F.N deficits, Normal affect  Symmetrical Chest wall movement, Good air movement bilaterally, CTAB RRR,No Gallops,Rubs ,Murmur +, No Parasternal Heave +ve B.Sounds, Abd Soft, Non tender,No rebound -guarding or rigidity. No Cyanosis, Clubbing or edema, No new Rash or bruise  Data Review   CBC w Diff:  Lab Results  Component Value Date   WBC 10.6 (H) 11/27/2018   HGB 12.3 11/27/2018   HCT 37.2 11/27/2018   PLT 229 11/27/2018   LYMPHOPCT 23 11/27/2018   MONOPCT 8 11/27/2018   EOSPCT 2 11/27/2018   BASOPCT 1 11/27/2018    CMP:  Lab Results  Component Value Date   NA 140 11/27/2018   K 4.1  11/27/2018   CL 107 11/27/2018   CO2 22 11/27/2018   BUN 23 11/27/2018   CREATININE 1.22 (H) 11/27/2018   CREATININE 1.31 (H) 08/28/2018   PROT 5.7 (L) 10/03/2018   ALBUMIN 3.6 10/03/2018   BILITOT 0.7 10/03/2018   ALKPHOS 44 10/03/2018   AST 19 10/03/2018   ALT 12 10/03/2018  .   Total Time in preparing paper work, data evaluation and todays exam - 76 minutes  Phillips Climes M.D on 11/28/2018 at 1:50 PM  [facility]   Office  [phone]

## 2018-11-28 NOTE — Progress Notes (Signed)
Patient arrived to the unit via bed from the emergency department.  Patient is alert and oriented to person and place only/  Skin assessment complete.  Generalized bruises to the arms and legs.  Stage 2 wound to the left buttocks.  Placed the patient on low bed with floor mats.  Placed the patient on cardiac monitoring.  Lowered the bed, activated the bed alarm and placed the call light within reach.  Will continue to monitor the patient and notify as needed

## 2018-11-28 NOTE — Progress Notes (Signed)
Nutrition Brief Note  Patient identified on the Malnutrition Screening Tool (MST) Report. Patient or family had reported poor intake secondary to decreased appetite and weight loss.   Per chart review, pts wt has been 135-155 for >5 years. Also per CSW note, pt planning to D/C in next 1-2 days with home hospice care.   No nutrition assessment warranted at this time. In light of decision to pursue hospice care, would consider liberalizing pt diet to regular for comfort purposes  If nutrition issues arise, please consult RD. Thank you.  Burtis Junes RD, LDN, CNSC Clinical Nutrition Available Tues-Sat via Pager: [phone] 11/28/2018 1:23 PM

## 2018-11-28 NOTE — Care Management Note (Addendum)
Case Management Note  Patient Details  Name: [name] MRN: [id number] Date of Birth: 12-19-1924  Subjective/Objective:     Patient is from home with daghter, NCM offered choice for home hospice to daughter Myrtie Hawk, she chose [facility],  Daughter states she is working on getting 24 hr care at home. Referral given to [facility] with [facility].  Anderson Malta will contact daughter and call this NCM back.  Patient has a Geologist, engineering and a shower seat at home.  It is possible that she will be dc to home today. Awaiting call back from [place] with [facility].  NCM received call back from Raina Mina with [facility], she states patient can dc today she has spoken with daughter ans sitter is at the home.  NCM informed RN and MD of this information, RN states patient will be ready at 2:30 for dc, Network engineer gave daughter Izora Gala this information.               Action/Plan: DC home with Home Hospice with [facility].   Expected Discharge Date:                  Expected Discharge Plan:  Home w Hospice Care  In-House Referral:     Discharge planning Services  CM Consult  Post Acute Care Choice:    Choice offered to:  Adult Children  DME Arranged:    DME Agency:     HH Arranged:   RN [place] Agency:   [facility]  Status of Service:  Completed, signed off  If discussed at [place] of Stay Meetings, dates discussed:    Additional Comments:  Zenon Mayo, RN 11/28/2018, 12:46 PM

## 2018-11-28 NOTE — Evaluation (Signed)
Physical Therapy Evaluation Patient Details Name: [name] MRN: [id number] DOB: 1925/10/08 Today's Date: 11/28/2018   History of Present Illness  84 y.o. female admitted on 11/27/18 for recurrent syncope.  She has been in and out of hospitals recently with recent evaluations by cardiology with event monitor, thought that syncopal episodes were autonomic in nauter (recommended avoiding dehydration and letting her BP run a bit high), recent neurology/EEG showed slowing and she was placed on Keppra as a precaution, and went to [facility] and had TEE that showed severe aortic stenosis (deemed not a candidate for valve replacement) and has hemodynamically significant carotid stenosis).  Pt with other PMH of CKD III, chronic systolic CHF, HTN, peripheral neuropathy, DM, biventricular pacemaker, A-fib, and cardic defibrillator.  Clinical Impression  Pt moved very quickly down the hallway and just as quickly fatigued, requesting to sit down and then starting to sit down before we had a chair behind her.  PT held pt up until sitter could drag a chair over to [place].  She reported she was just "tired" denied lightheadedness, VSS.  Pt gets easily irritated by questions, does not want to be touched.    Follow Up Recommendations Home health PT;Other (comment)(I see planned hospice, if she can have home PT as well)    Equipment Recommendations  None recommended by PT    Recommendations for Other Services   NA    Precautions / Restrictions Precautions Precautions: Fall Precaution Comments: recurrent, multiple bruises      Mobility  Bed Mobility               General bed mobility comments: Pt was OOB in the recliner chair.   Transfers Overall transfer level: Needs assistance Equipment used: Rolling walker (2 wheeled) Transfers: Sit to/from Stand Sit to Stand: Min assist         General transfer comment: Min assist to power up to standing, cues and manual assist to find the  walker handles.   Ambulation/Gait Ambulation/Gait assistance: Min assist Gait Distance (Feet): 120 Feet Assistive device: Rolling walker (2 wheeled) Gait Pattern/deviations: Step-through pattern;Trunk flexed Gait velocity: too fast to be safe and would not slow down with cues.    General Gait Details: Pt walking way to fast initially to be safe, min assist to support trunk, eyes closed much of the walk and session, half way back to the room, pt reported she was tired and wanted to sit down, and then immediately proceeded to start to sit down without waiting for a chair.  Therapist attempted to encourage her to keep going and she continued to keep sitting.  Luickily, the sitter was there and grabbed a chair from the room for [place] to sit down on in the hallway, otherwise, the pt would have been on the ground.  Vitals were stable and she did not report any lightheadedness, just was feeling tired and wanted to sit down.          Balance Overall balance assessment: Needs assistance Sitting-balance support: Feet supported;Bilateral upper extremity supported Sitting balance-Leahy Scale: Fair     Standing balance support: Bilateral upper extremity supported Standing balance-Leahy Scale: Poor                               Pertinent Vitals/Pain Pain Assessment: Faces Faces Pain Scale: Hurts even more Pain Location: generalized body, R wrist, R shoulder Pain Descriptors / Indicators: Grimacing;Guarding Pain Intervention(s): Limited activity within  patient's tolerance;Monitored during session;Repositioned    Home Living Family/patient expects to be discharged to:: Private residence Living Arrangements: Children               Additional Comments: Pt is a poor historian and no family present to report    Prior Function Level of Independence: Independent with assistive device(s)         Comments: uses RW at baseline.          Extremity/Trunk Assessment   Upper  Extremity Assessment Upper Extremity Assessment: RUE deficits/detail RUE Deficits / Details: right wrist with bruising and tenderness and reports of right shoulder tenderness.     Lower Extremity Assessment Lower Extremity Assessment: Generalized weakness    Cervical / Trunk Assessment Cervical / Trunk Assessment: Kyphotic  Communication   Communication: HOH  Cognition Arousal/Alertness: Lethargic(eyes closed quite a bit during our session. ) Behavior During Therapy: Flat affect;Impulsive;Agitated(easily irritated) Overall Cognitive Status: History of cognitive impairments - at baseline(no one to report baseline)                                 General Comments: "Can I have some peace and quiet now?" after asking history questions.               Assessment/Plan    PT Assessment Patient needs continued PT services  PT Problem List Decreased activity tolerance;Decreased strength;Decreased balance;Decreased mobility;Decreased knowledge of use of DME       PT Treatment Interventions DME instruction;Gait training;Stair training;Functional mobility training;Therapeutic exercise;Therapeutic activities;Balance training;Neuromuscular re-education;Cognitive remediation;Patient/family education    PT Goals (Current goals can be found in the Care Plan section)  Acute Rehab PT Goals Patient Stated Goal: unable to state, other than she wants to be left alone PT Goal Formulation: Patient unable to participate in goal setting Time For Goal Achievement: 12/12/18 Potential to Achieve Goals: Good    Frequency Min 3X/week           AM-PAC PT "6 Clicks" Mobility  Outcome Measure Help needed turning from your back to your side while in a flat bed without using bedrails?: A Little Help needed moving from lying on your back to sitting on the side of a flat bed without using bedrails?: A Little Help needed moving to and from a bed to a chair (including a wheelchair)?: A  Little Help needed standing up from a chair using your arms (e.g., wheelchair or bedside chair)?: A Little Help needed to walk in hospital room?: A Little Help needed climbing 3-5 steps with a railing? : A Little 6 Click Score: 18    End of Session Equipment Utilized During Treatment: Gait belt Activity Tolerance: Patient limited by fatigue;Patient limited by lethargy Patient left: in chair;with call bell/phone within reach;with chair alarm set;with nursing/sitter in room Nurse Communication: Mobility status PT Visit Diagnosis: Repeated falls (R29.6);Muscle weakness (generalized) (M62.81);Difficulty in walking, not elsewhere classified (R26.2);Pain Pain - Right/Left: Right Pain - part of body: Arm    Time: 1131-1151 PT Time Calculation (min) (ACUTE ONLY): 20 min   Charges:        Wells Guiles B. Raissa Dam, PT, DPT  Acute Rehabilitation #[phone] pager #[phone] office   PT Evaluation $PT Eval Moderate Complexity: 1 Mod         11/28/2018, 1:32 PM

## 2018-11-30 DIAGNOSIS — N183 Chronic kidney disease, stage 3 (moderate): Secondary | ICD-10-CM | POA: Diagnosis not present

## 2018-11-30 DIAGNOSIS — E785 Hyperlipidemia, unspecified: Secondary | ICD-10-CM | POA: Diagnosis not present

## 2018-11-30 DIAGNOSIS — Z950 Presence of cardiac pacemaker: Secondary | ICD-10-CM | POA: Diagnosis not present

## 2018-11-30 DIAGNOSIS — I5040 Unspecified combined systolic (congestive) and diastolic (congestive) heart failure: Secondary | ICD-10-CM | POA: Diagnosis not present

## 2018-11-30 DIAGNOSIS — I10 Essential (primary) hypertension: Secondary | ICD-10-CM | POA: Diagnosis not present

## 2018-11-30 DIAGNOSIS — R609 Edema, unspecified: Secondary | ICD-10-CM | POA: Diagnosis not present

## 2018-11-30 DIAGNOSIS — L8991 Pressure ulcer of unspecified site, stage 1: Secondary | ICD-10-CM | POA: Diagnosis not present

## 2018-11-30 DIAGNOSIS — L89309 Pressure ulcer of unspecified buttock, unspecified stage: Secondary | ICD-10-CM | POA: Diagnosis not present

## 2018-11-30 DIAGNOSIS — K219 Gastro-esophageal reflux disease without esophagitis: Secondary | ICD-10-CM | POA: Diagnosis not present

## 2018-11-30 DIAGNOSIS — I4891 Unspecified atrial fibrillation: Secondary | ICD-10-CM | POA: Diagnosis not present

## 2018-11-30 DIAGNOSIS — I352 Nonrheumatic aortic (valve) stenosis with insufficiency: Secondary | ICD-10-CM | POA: Diagnosis not present

## 2018-11-30 DIAGNOSIS — E119 Type 2 diabetes mellitus without complications: Secondary | ICD-10-CM | POA: Diagnosis not present

## 2018-11-30 DIAGNOSIS — F418 Other specified anxiety disorders: Secondary | ICD-10-CM | POA: Diagnosis not present

## 2018-12-01 DIAGNOSIS — E785 Hyperlipidemia, unspecified: Secondary | ICD-10-CM | POA: Diagnosis not present

## 2018-12-01 DIAGNOSIS — I5040 Unspecified combined systolic (congestive) and diastolic (congestive) heart failure: Secondary | ICD-10-CM | POA: Diagnosis not present

## 2018-12-01 DIAGNOSIS — I4891 Unspecified atrial fibrillation: Secondary | ICD-10-CM | POA: Diagnosis not present

## 2018-12-01 DIAGNOSIS — Z950 Presence of cardiac pacemaker: Secondary | ICD-10-CM | POA: Diagnosis not present

## 2018-12-01 DIAGNOSIS — I352 Nonrheumatic aortic (valve) stenosis with insufficiency: Secondary | ICD-10-CM | POA: Diagnosis not present

## 2018-12-01 DIAGNOSIS — E119 Type 2 diabetes mellitus without complications: Secondary | ICD-10-CM | POA: Diagnosis not present

## 2018-12-02 ENCOUNTER — Encounter: Payer: Self-pay | Admitting: Internal Medicine

## 2018-12-02 NOTE — Progress Notes (Signed)
     C  A  N  C  E  L  L  E  D  Transferred to [facility]

## 2018-12-03 ENCOUNTER — Ambulatory Visit: Payer: Self-pay | Admitting: Internal Medicine

## 2018-12-03 ENCOUNTER — Telehealth: Payer: Self-pay | Admitting: Internal Medicine

## 2018-12-03 DIAGNOSIS — I5040 Unspecified combined systolic (congestive) and diastolic (congestive) heart failure: Secondary | ICD-10-CM | POA: Diagnosis not present

## 2018-12-03 DIAGNOSIS — E119 Type 2 diabetes mellitus without complications: Secondary | ICD-10-CM | POA: Diagnosis not present

## 2018-12-03 DIAGNOSIS — E785 Hyperlipidemia, unspecified: Secondary | ICD-10-CM | POA: Diagnosis not present

## 2018-12-03 DIAGNOSIS — I352 Nonrheumatic aortic (valve) stenosis with insufficiency: Secondary | ICD-10-CM | POA: Diagnosis not present

## 2018-12-03 DIAGNOSIS — Z950 Presence of cardiac pacemaker: Secondary | ICD-10-CM | POA: Diagnosis not present

## 2018-12-03 DIAGNOSIS — I4891 Unspecified atrial fibrillation: Secondary | ICD-10-CM | POA: Diagnosis not present

## 2018-12-03 NOTE — Telephone Encounter (Signed)
  Daughter is calling to make [place] aware that patient is under Hospice care. Daughter needs to know if patient needs to keep the appts that she has scheduled with Dr Acie Fredrickson and Dr Lovena Le

## 2018-12-03 NOTE — Telephone Encounter (Signed)
Called patient's daughter and advised that appointment with Dr. Acie Fredrickson is not necessary. I advised that Dr. Lovena Le has stated that he will follow the patient remotely as needed. Daughter asks about monitor that was ordered for patient that she wore for approximately 2 weeks. I asked her to box it up and place it in the mail. She states if she cannot find the box that she will drop it off at the office. She thanked me for the call.

## 2018-12-04 DIAGNOSIS — I4891 Unspecified atrial fibrillation: Secondary | ICD-10-CM | POA: Diagnosis not present

## 2018-12-04 DIAGNOSIS — I5040 Unspecified combined systolic (congestive) and diastolic (congestive) heart failure: Secondary | ICD-10-CM | POA: Diagnosis not present

## 2018-12-04 DIAGNOSIS — Z950 Presence of cardiac pacemaker: Secondary | ICD-10-CM | POA: Diagnosis not present

## 2018-12-04 DIAGNOSIS — I352 Nonrheumatic aortic (valve) stenosis with insufficiency: Secondary | ICD-10-CM | POA: Diagnosis not present

## 2018-12-04 DIAGNOSIS — E119 Type 2 diabetes mellitus without complications: Secondary | ICD-10-CM | POA: Diagnosis not present

## 2018-12-04 DIAGNOSIS — E785 Hyperlipidemia, unspecified: Secondary | ICD-10-CM | POA: Diagnosis not present

## 2018-12-07 ENCOUNTER — Other Ambulatory Visit: Payer: Self-pay | Admitting: Internal Medicine

## 2018-12-07 DIAGNOSIS — I4891 Unspecified atrial fibrillation: Secondary | ICD-10-CM | POA: Diagnosis not present

## 2018-12-07 DIAGNOSIS — I352 Nonrheumatic aortic (valve) stenosis with insufficiency: Secondary | ICD-10-CM | POA: Diagnosis not present

## 2018-12-07 DIAGNOSIS — I5040 Unspecified combined systolic (congestive) and diastolic (congestive) heart failure: Secondary | ICD-10-CM | POA: Diagnosis not present

## 2018-12-07 DIAGNOSIS — E785 Hyperlipidemia, unspecified: Secondary | ICD-10-CM | POA: Diagnosis not present

## 2018-12-07 DIAGNOSIS — Z950 Presence of cardiac pacemaker: Secondary | ICD-10-CM | POA: Diagnosis not present

## 2018-12-07 DIAGNOSIS — E119 Type 2 diabetes mellitus without complications: Secondary | ICD-10-CM | POA: Diagnosis not present

## 2018-12-07 MED ORDER — MIRTAZAPINE 7.5 MG PO TABS: ORAL_TABLET | ORAL | 3 refills | Status: AC

## 2018-12-07 MED ORDER — LORAZEPAM 0.5 MG PO TABS: ORAL_TABLET | ORAL | 0 refills | Status: AC

## 2018-12-08 DIAGNOSIS — I352 Nonrheumatic aortic (valve) stenosis with insufficiency: Secondary | ICD-10-CM | POA: Diagnosis not present

## 2018-12-08 DIAGNOSIS — E119 Type 2 diabetes mellitus without complications: Secondary | ICD-10-CM | POA: Diagnosis not present

## 2018-12-08 DIAGNOSIS — I5040 Unspecified combined systolic (congestive) and diastolic (congestive) heart failure: Secondary | ICD-10-CM | POA: Diagnosis not present

## 2018-12-08 DIAGNOSIS — I4891 Unspecified atrial fibrillation: Secondary | ICD-10-CM | POA: Diagnosis not present

## 2018-12-08 DIAGNOSIS — E785 Hyperlipidemia, unspecified: Secondary | ICD-10-CM | POA: Diagnosis not present

## 2018-12-08 DIAGNOSIS — Z950 Presence of cardiac pacemaker: Secondary | ICD-10-CM | POA: Diagnosis not present

## 2018-12-09 DIAGNOSIS — L89309 Pressure ulcer of unspecified buttock, unspecified stage: Secondary | ICD-10-CM | POA: Diagnosis not present

## 2018-12-09 DIAGNOSIS — I4891 Unspecified atrial fibrillation: Secondary | ICD-10-CM | POA: Diagnosis not present

## 2018-12-09 DIAGNOSIS — Z950 Presence of cardiac pacemaker: Secondary | ICD-10-CM | POA: Diagnosis not present

## 2018-12-09 DIAGNOSIS — F418 Other specified anxiety disorders: Secondary | ICD-10-CM | POA: Diagnosis not present

## 2018-12-09 DIAGNOSIS — L8991 Pressure ulcer of unspecified site, stage 1: Secondary | ICD-10-CM | POA: Diagnosis not present

## 2018-12-09 DIAGNOSIS — I352 Nonrheumatic aortic (valve) stenosis with insufficiency: Secondary | ICD-10-CM | POA: Diagnosis not present

## 2018-12-09 DIAGNOSIS — K219 Gastro-esophageal reflux disease without esophagitis: Secondary | ICD-10-CM | POA: Diagnosis not present

## 2018-12-09 DIAGNOSIS — E785 Hyperlipidemia, unspecified: Secondary | ICD-10-CM | POA: Diagnosis not present

## 2018-12-09 DIAGNOSIS — I10 Essential (primary) hypertension: Secondary | ICD-10-CM | POA: Diagnosis not present

## 2018-12-09 DIAGNOSIS — E119 Type 2 diabetes mellitus without complications: Secondary | ICD-10-CM | POA: Diagnosis not present

## 2018-12-09 DIAGNOSIS — R609 Edema, unspecified: Secondary | ICD-10-CM | POA: Diagnosis not present

## 2018-12-09 DIAGNOSIS — N183 Chronic kidney disease, stage 3 (moderate): Secondary | ICD-10-CM | POA: Diagnosis not present

## 2018-12-09 DIAGNOSIS — I5040 Unspecified combined systolic (congestive) and diastolic (congestive) heart failure: Secondary | ICD-10-CM | POA: Diagnosis not present

## 2018-12-15 ENCOUNTER — Other Ambulatory Visit: Payer: Self-pay | Admitting: Internal Medicine

## 2018-12-18 ENCOUNTER — Other Ambulatory Visit: Payer: Self-pay | Admitting: Internal Medicine

## 2018-12-18 ENCOUNTER — Ambulatory Visit: Payer: Medicare Other | Admitting: Cardiovascular Disease

## 2018-12-18 DIAGNOSIS — I10 Essential (primary) hypertension: Secondary | ICD-10-CM

## 2018-12-18 MED ORDER — LOSARTAN POTASSIUM 25 MG PO TABS: ORAL_TABLET | ORAL | 1 refills | Status: DC

## 2018-12-19 ENCOUNTER — Encounter (HOSPITAL_COMMUNITY): Payer: Self-pay

## 2018-12-19 ENCOUNTER — Emergency Department (HOSPITAL_COMMUNITY)
Admission: EM | Admit: 2018-12-19 | Discharge: 2018-12-20 | Disposition: A | Discharge status: Home / Self Care | Attending: Emergency Medicine | Admitting: Emergency Medicine

## 2018-12-19 DIAGNOSIS — Z950 Presence of cardiac pacemaker: Secondary | ICD-10-CM | POA: Insufficient documentation

## 2018-12-19 DIAGNOSIS — N183 Chronic kidney disease, stage 3 (moderate): Secondary | ICD-10-CM | POA: Diagnosis not present

## 2018-12-19 DIAGNOSIS — E1122 Type 2 diabetes mellitus with diabetic chronic kidney disease: Secondary | ICD-10-CM | POA: Diagnosis not present

## 2018-12-19 DIAGNOSIS — R040 Epistaxis: Secondary | ICD-10-CM | POA: Diagnosis not present

## 2018-12-19 DIAGNOSIS — Z79899 Other long term (current) drug therapy: Secondary | ICD-10-CM | POA: Diagnosis not present

## 2018-12-19 DIAGNOSIS — Z7722 Contact with and (suspected) exposure to environmental tobacco smoke (acute) (chronic): Secondary | ICD-10-CM | POA: Insufficient documentation

## 2018-12-19 DIAGNOSIS — Z7982 Long term (current) use of aspirin: Secondary | ICD-10-CM | POA: Insufficient documentation

## 2018-12-19 DIAGNOSIS — I5022 Chronic systolic (congestive) heart failure: Secondary | ICD-10-CM | POA: Diagnosis not present

## 2018-12-19 DIAGNOSIS — R58 Hemorrhage, not elsewhere classified: Secondary | ICD-10-CM | POA: Diagnosis not present

## 2018-12-19 DIAGNOSIS — E1140 Type 2 diabetes mellitus with diabetic neuropathy, unspecified: Secondary | ICD-10-CM | POA: Diagnosis not present

## 2018-12-19 DIAGNOSIS — Z7902 Long term (current) use of antithrombotics/antiplatelets: Secondary | ICD-10-CM | POA: Insufficient documentation

## 2018-12-19 DIAGNOSIS — I130 Hypertensive heart and chronic kidney disease with heart failure and stage 1 through stage 4 chronic kidney disease, or unspecified chronic kidney disease: Secondary | ICD-10-CM | POA: Diagnosis not present

## 2018-12-19 NOTE — ED Triage Notes (Signed)
Pt comes via [facility] for nosebleed that has been going on for a while, slow drip, bleeding controlled, on blood thinners.

## 2018-12-20 ENCOUNTER — Other Ambulatory Visit: Payer: Self-pay | Admitting: Internal Medicine

## 2018-12-20 MED ORDER — LIDOCAINE HCL 4 % EX SOLN
Freq: Once | CUTANEOUS | Status: AC
  Administered 2018-12-20: 01:00:00 via TOPICAL
  Filled 2018-12-20: qty 50

## 2018-12-20 MED ORDER — OXYMETAZOLINE HCL 0.05 % NA SOLN
1.0000 | Freq: Once | NASAL | Status: AC
  Administered 2018-12-20: 1 via NASAL
  Filled 2018-12-20: qty 15

## 2018-12-20 MED ORDER — SILVER NITRATE-POT NITRATE 75-25 % EX MISC
3.0000 | Freq: Once | CUTANEOUS | Status: AC
  Administered 2018-12-20: 3 via TOPICAL
  Filled 2018-12-20: qty 3

## 2018-12-20 NOTE — ED Notes (Signed)
Pt and family updated on plan of care, pt moved to hallway and awaiting disposition home.

## 2018-12-20 NOTE — ED Notes (Signed)
ED secretary requested to contact [facility] to transport patient back home upon discharge due to patient being under hospice of [place]

## 2018-12-20 NOTE — ED Notes (Signed)
EMS at bedside to transport patient home.

## 2018-12-20 NOTE — ED Notes (Signed)
Patient's daughter at bedside verbalized understanding of dc instructions, vss. Pt taken via [facility] stretcher out of ED, resp e/u, nad.

## 2018-12-20 NOTE — ED Provider Notes (Signed)
[facility] EMERGENCY DEPARTMENT Provider Note   CSN: [id number] Arrival date & time: 12/19/18  2354     History   Chief Complaint Chief Complaint  Patient presents with  . Epistaxis    HPI [name] is a 85 y.o. female.  Patient presents for evaluation of nosebleed.  Patient reports that she started having bleeding from the left side of her nose this afternoon.  She has had intermittent slow dripping of blood throughout the day.  Patient denies any direct injury.  She does take aspirin and Plavix.     Past Medical History:  Diagnosis Date  . A-fib ([place])   . Biventricular cardiac pacemaker in situ    05/06/13  Old AICD explanted and 6949 lead capped  RV lead  Medtronic 5076 [id number]  LV lead  Medtronic 4194  [id number] V Medtronic biventricular pacer  PVX [id number]   . Diabetes mellitus without complication ([place])   . GERD (gastroesophageal reflux disease)   . History of duodenal ulcer   . History of esophageal stricture   . Hyperlipidemia   . Hypertension   . Occlusion and stenosis of carotid artery without mention of cerebral infarction   . Peripheral neuropathy   . Right groin pain 12/12/2014  . Secondary cardiomyopathy, unspecified   . Transient global amnesia     Patient Active Problem List   Diagnosis Date Noted  . Recurrent syncope 11/28/2018  . Depression with anxiety 11/28/2018  . Aortic valve stenosis   . Syncope and collapse 09/09/2018  . ASHD (arteriosclerotic heart disease) 12/05/2014  . GERD (gastroesophageal reflux disease) 12/05/2014  . Prediabetes 05/21/2014  . Vitamin D deficiency 05/20/2014  . Medication management 05/20/2014  . CKD (chronic kidney disease), stage III ([place]) 06/21/2013  . History of vasodepressor syncope   . Biventricular cardiac pacemaker in situ   . Mixed hyperlipidemia   . Hypertension   . LBBB   . Chronic systolic heart failure ([place])   . Dilated cardiomyopathy, nonischemic     Past Surgical History:   Procedure Laterality Date  . ABDOMINAL HYSTERECTOMY    . APPENDECTOMY    . BI-VENTRICULAR PACEMAKER UPGRADE N/A 05/05/2013   Procedure: BI-VENTRICULAR PACEMAKER UPGRADE;  Surgeon: Evans Lance, MD;  Location: [facility] CATH LAB;  Service: Cardiovascular;  Laterality: N/A;  . BIV PACEMAKER GENERATOR CHANGE OUT  05/05/2013   MDT CRTP upgrade (previously implanted dual chamber ICD) by Dr Lovena Le.  New RV/LV leads placed.   Marland Kitchen BREAST SURGERY Left 1985   Negative  Biopsy  . CARDIAC CATHETERIZATION     04/2003  . CARDIAC DEFIBRILLATOR PLACEMENT  09/2004   MDT ICD implanted for primary prevention; device upgrade to CRTP in 04-2013  . CARPAL TUNNEL RELEASE    . CHOLECYSTECTOMY       OB History   No obstetric history on file.      Home Medications    Prior to Admission medications   Medication Sig Start Date End Date Taking? Authorizing Provider  aspirin EC 81 MG tablet Take 81 mg by mouth daily.   Yes [provider]  buPROPion (WELLBUTRIN SR) 100 MG 12 hr tablet Take 100 mg by mouth every morning.   Yes [provider]  clopidogrel (PLAVIX) 75 MG tablet TAKE 1 TABLET BY MOUTH EVERY DAY TO PREVENT HEART ATTACK Patient taking differently: Take 75 mg by mouth daily.  10/08/18  Yes Unk Pinto, MD  escitalopram (LEXAPRO) 20 MG tablet Take 20 mg by mouth every  morning.   Yes [provider]  gabapentin (NEURONTIN) 600 MG tablet TAKE 1 TABLET BY MOUTH EVERY EVENING AT BEDTIME Patient taking differently: Take 600 mg by mouth at bedtime.  06/01/18  Yes Unk Pinto, MD  levETIRAcetam (KEPPRA) 250 MG tablet Take 1 tablet (250 mg total) by mouth 2 (two) times daily. 10/14/18  Yes Dohmeier, Asencion Partridge, MD  LORazepam (ATIVAN) 0.5 MG tablet Take 1/2 to 1 tablet    2 to 3 x /day if needed for Anxiety Patient taking differently: Take 0.5 mg by mouth every 6 (six) hours as needed for anxiety.  12/07/18  Yes Unk Pinto, MD  losartan (COZAAR) 25 MG tablet Take 1 tablet 2 x /day  for BP Patient taking differently: Take 25 mg by mouth every evening.  12/18/18  Yes Unk Pinto, MD  metoprolol tartrate (LOPRESSOR) 25 MG tablet Take 12.5 mg by mouth 2 (two) times daily.    Yes [provider]  mirtazapine (REMERON) 7.5 MG tablet Take 1 tablet at bedtime for Sleep Patient taking differently: Take 7.5 mg by mouth at bedtime.  12/07/18  Yes Unk Pinto, MD  senna-docusate (SENOKOT-S) 8.6-50 MG tablet Take 1 tablet by mouth 2 (two) times daily.   Yes [provider]  famotidine (PEPCID) 20 MG tablet Take 1  Tablet 1 or 2 x / day if needed for Indigestion and Heartburn Patient not taking: Reported on 12/20/2018 11/21/18   Unk Pinto, MD  HYDROcodone-acetaminophen (NORCO/VICODIN) 5-325 MG tablet Take 1 tablet by mouth every 4 (four) hours as needed for moderate pain. Patient not taking: Reported on 12/20/2018 11/28/18   Elgergawy, Silver Huguenin, MD    Family History Family History  Problem Relation Age of Onset  . Dementia Mother   . Diabetes Mother   . Heart Problems Father   . Heart attack Son   . Heart disease Son   . Heart disease Brother   . Heart attack Brother   . Cancer Maternal Grandmother        breast  . Stroke Daughter   . Hypertension Daughter   . Hyperlipidemia Daughter   . Diabetes Daughter   . Diabetes Sister   . Hypertension Sister   . Hyperlipidemia Sister   . Hypertension Sister   . Hyperlipidemia Sister   . Cancer Sister        Breast  . Heart Problems Sister     Social History Social History   Tobacco Use  . Smoking status: Passive Smoke Exposure - Never Smoker  . Smokeless tobacco: Never Used  . Tobacco comment: worked at [employer] Topics  . Alcohol use: No    Alcohol/week: 0.0 standard drinks  . Drug use: No     Allergies   Nexium [esomeprazole magnesium]   Review of Systems Review of Systems  HENT: Positive for nosebleeds.   All other systems reviewed and are  negative.    Physical Exam Updated Vital Signs BP (!) 161/71 (BP Location: Right Arm)   Pulse 61   Temp 98.3 F (36.8 C) (Oral)   Resp 12   SpO2 97%   Physical Exam Vitals signs and nursing note reviewed.  Constitutional:      General: She is not in acute distress.    Appearance: Normal appearance. She is well-developed.  HENT:     Head: Normocephalic and atraumatic.     Right Ear: Hearing normal.     Left Ear: Hearing normal.     Nose:  Comments: Small amount of blood present left side of nose, dilated vessels on anterior nasal septum Eyes:     Conjunctiva/sclera: Conjunctivae normal.     Pupils: Pupils are equal, round, and reactive to light.  Neck:     Musculoskeletal: Normal range of motion and neck supple.  Cardiovascular:     Rate and Rhythm: Regular rhythm.     Heart sounds: S1 normal and S2 normal. No murmur. No friction rub. No gallop.   Pulmonary:     Effort: Pulmonary effort is normal. No respiratory distress.     Breath sounds: Normal breath sounds.  Chest:     Chest wall: No tenderness.  Abdominal:     General: Bowel sounds are normal.     Palpations: Abdomen is soft.     Tenderness: There is no abdominal tenderness. There is no guarding or rebound. Negative signs include Murphy's sign and McBurney's sign.     Hernia: No hernia is present.  Musculoskeletal: Normal range of motion.  Skin:    General: Skin is warm and dry.     Findings: No rash.  Neurological:     Mental Status: She is alert and oriented to person, place, and time.     GCS: GCS eye subscore is 4. GCS verbal subscore is 5. GCS motor subscore is 6.     Cranial Nerves: No cranial nerve deficit.     Sensory: No sensory deficit.     Coordination: Coordination normal.  Psychiatric:        Speech: Speech normal.        Behavior: Behavior normal.        Thought Content: Thought content normal.      ED Treatments / Results  Labs (all labs ordered are listed, but only abnormal results  are displayed) Labs Reviewed - No data to display  EKG None  Radiology No results found.  Procedures .Epistaxis Management Date/Time: 12/20/2018 5:22 AM Performed by: Orpah Greek, MD Authorized by: Orpah Greek, MD   Consent:    Consent obtained:  Verbal   Consent given by:  Patient   Risks discussed:  Pain Universal protocol:    Procedure explained and questions answered to patient or proxy's satisfaction: yes     Site/side marked: yes     Time out called: yes     Patient identity confirmed:  Verbally with patient Anesthesia (see MAR for exact dosages):    Anesthesia method:  Topical application   Topical anesthetic:  Lidocaine gel Procedure details:    Treatment site:  L anterior   Treatment method:  Silver nitrate   Treatment complexity:  Limited   Treatment episode: initial   Post-procedure details:    Assessment:  Bleeding stopped   Patient tolerance of procedure:  Tolerated well, no immediate complications   (including critical care time)  Medications Ordered in ED Medications  lidocaine (XYLOCAINE) 4 % external solution ( Topical Given 12/20/18 0055)  silver nitrate applicators applicator 3 Stick (3 Sticks Topical Given by Other 12/20/18 0055)  oxymetazoline (AFRIN) 0.05 % nasal spray 1 spray (1 spray Left Nare Given 12/20/18 0335)     Initial Impression / Assessment and Plan / ED Course  I have reviewed the triage vital signs and the nursing notes.  Pertinent labs & imaging results that were available during my care of the patient were reviewed by me and considered in my medical decision making (see chart for details).     Patient presents to the  emergency department for evaluation of nosebleed.  Family reports that the bleeding began earlier yesterday and has been intermittently bleeding throughout the day and night.  Examination reveals dilated vessels on the septum are the likely cause of the bleeding.  These were cauterized with silver  nitrate and bleeding stopped.  Patient monitored for period of time, no further bleeding, discharge and follow-up with ENT.  Final Clinical Impressions(s) / ED Diagnoses   Final diagnoses:  Epistaxis    ED Discharge Orders    None       Orpah Greek, MD 12/20/18 [id number]

## 2018-12-21 ENCOUNTER — Other Ambulatory Visit: Payer: Self-pay | Admitting: Internal Medicine

## 2018-12-21 DIAGNOSIS — I10 Essential (primary) hypertension: Secondary | ICD-10-CM

## 2018-12-21 MED ORDER — LOSARTAN POTASSIUM 25 MG PO TABS: ORAL_TABLET | ORAL | 1 refills | Status: AC

## 2018-12-22 ENCOUNTER — Other Ambulatory Visit: Payer: Medicare Other

## 2018-12-25 LAB — CUP PACEART REMOTE DEVICE CHECK
Battery Remaining Longevity: 50 mo
Battery Voltage: 2.98 V
Brady Statistic AP VP Percent: 28.05 %
Brady Statistic AP VS Percent: 0.03 %
Brady Statistic AS VP Percent: 71.67 %
Brady Statistic AS VS Percent: 0.24 %
Date Time Interrogation Session: 20191126203849
Implantable Lead Implant Date: 20051007
Implantable Lead Implant Date: 20140528
Implantable Lead Implant Date: 20140528
Implantable Lead Location: 753858
Implantable Lead Location: 753859
Implantable Lead Location: 753860
Implantable Lead Model: 4194
Lead Channel Impedance Value: 285 Ohm
Lead Channel Impedance Value: 399 Ohm
Lead Channel Impedance Value: 399 Ohm
Lead Channel Impedance Value: 418 Ohm
Lead Channel Impedance Value: 456 Ohm
Lead Channel Impedance Value: 456 Ohm
Lead Channel Impedance Value: 475 Ohm
Lead Channel Impedance Value: 551 Ohm
Lead Channel Pacing Threshold Amplitude: 0.375 V
Lead Channel Pacing Threshold Amplitude: 0.625 V
Lead Channel Pacing Threshold Amplitude: 1.125 V
Lead Channel Pacing Threshold Pulse Width: 0.4 ms
Lead Channel Pacing Threshold Pulse Width: 0.4 ms
Lead Channel Sensing Intrinsic Amplitude: 1.25 mV
Lead Channel Sensing Intrinsic Amplitude: 1.25 mV
Lead Channel Sensing Intrinsic Amplitude: 8.625 mV
Lead Channel Sensing Intrinsic Amplitude: 8.625 mV
Lead Channel Setting Pacing Amplitude: 2 V
Lead Channel Setting Pacing Amplitude: 2 V
Lead Channel Setting Pacing Amplitude: 2.25 V
Lead Channel Setting Pacing Pulse Width: 0.4 ms
Lead Channel Setting Pacing Pulse Width: 0.4 ms
Lead Channel Setting Sensing Sensitivity: 0.9 mV
MDC IDC MSMT LEADCHNL LV IMPEDANCE VALUE: 570 Ohm
MDC IDC MSMT LEADCHNL LV PACING THRESHOLD PULSEWIDTH: 0.4 ms
MDC IDC PG IMPLANT DT: 20140528
MDC IDC STAT BRADY RA PERCENT PACED: 27.98 %
MDC IDC STAT BRADY RV PERCENT PACED: 99.38 %

## 2019-01-09 DEATH — deceased

## 2019-02-02 ENCOUNTER — Encounter: Payer: Self-pay | Admitting: *Deleted

## 2019-02-03 ENCOUNTER — Telehealth: Payer: Self-pay

## 2019-02-03 NOTE — Telephone Encounter (Signed)
Left message for patient to remind of missed remote transmission.  

## 2019-02-08 ENCOUNTER — Ambulatory Visit: Payer: Medicare Other | Admitting: Podiatry

## 2019-02-10 ENCOUNTER — Encounter: Payer: Self-pay | Admitting: Cardiology

## 2019-02-17 ENCOUNTER — Ambulatory Visit: Payer: Medicare Other | Admitting: Neurology

## 2019-04-01 ENCOUNTER — Encounter: Payer: Self-pay | Admitting: Internal Medicine

## 2019-04-27 ENCOUNTER — Encounter: Payer: Self-pay | Admitting: Internal Medicine

## 2020-02-03 IMAGING — DX DG CHEST 2V
2 series · 2 of 2 positions shown · non-contrast
Comparison: Chest radiographs 09/06/2017 and earlier.

CLINICAL DATA: [AGE] female with weakness and flu like
symptoms for 1 week. Fever of 101.7.

EXAM:
CHEST - 2 VIEW

[chest lat]
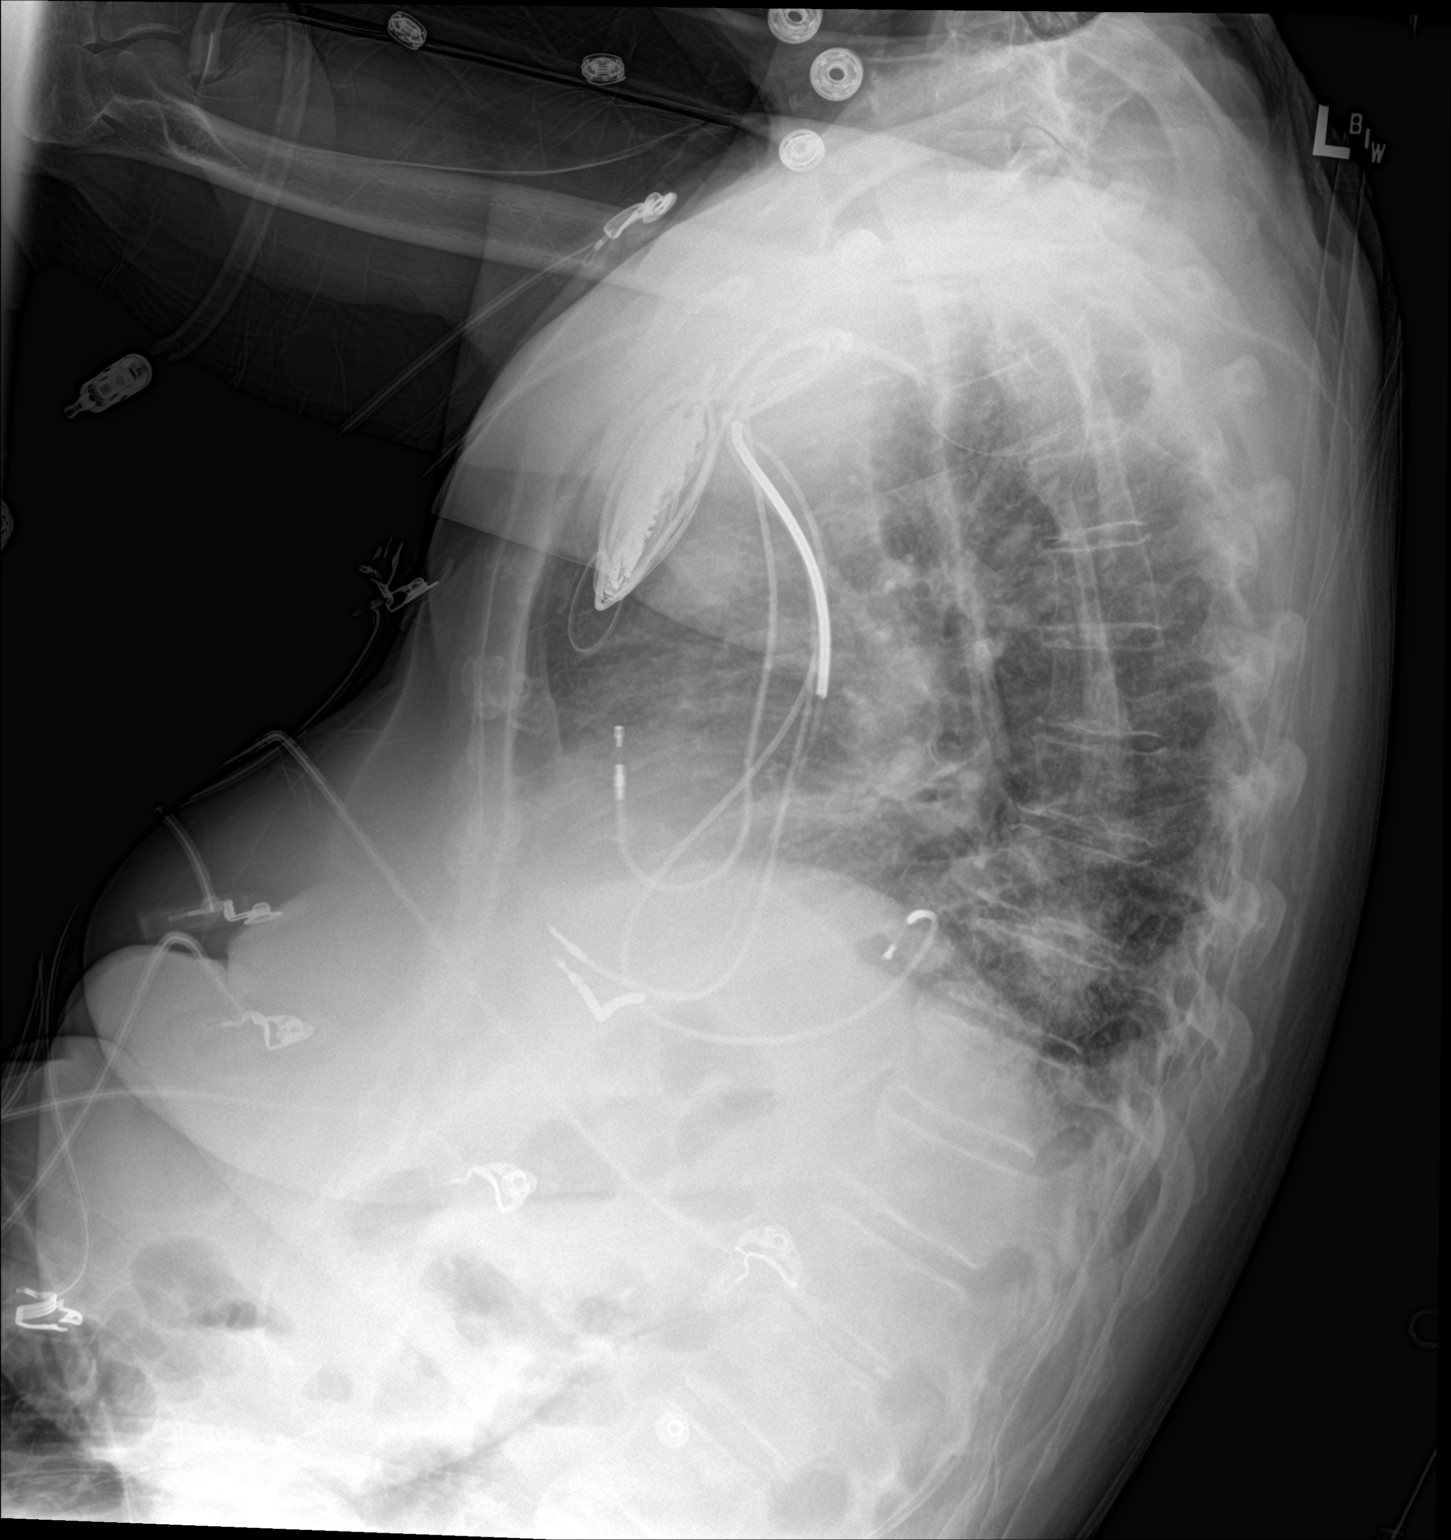

[chest ap]
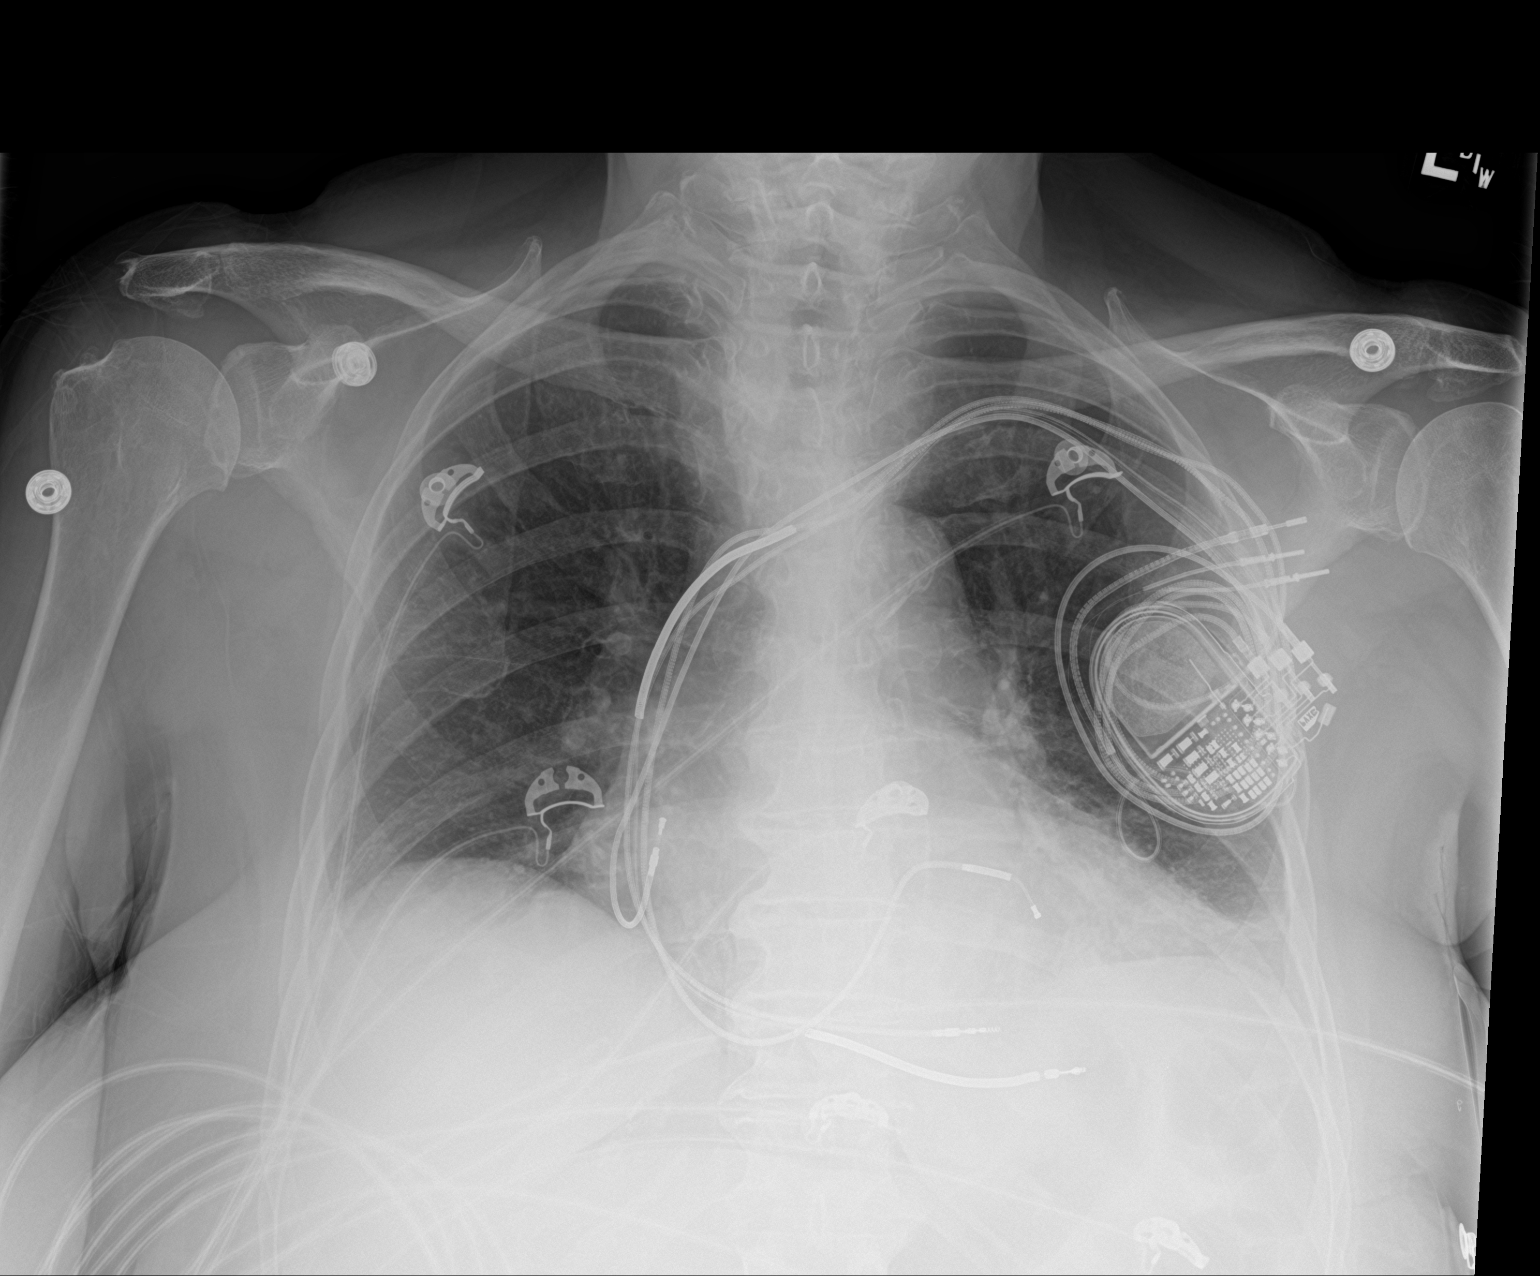

[2 of 2 positions shown; findings below may reference images not displayed]

FINDINGS: Semi upright AP and lateral views of the chest. Stable cardiomegaly
and mediastinal contours. Stable somewhat low lung volumes. Stable
left chest cardiac AICD. No pneumothorax, pulmonary edema, pleural
effusion or consolidation. Mild lower lobe atelectasis suspected and
stable to the 6941 comparison. No acute osseous abnormality
identified. Negative visible bowel gas pattern.
IMPRESSION: Stable.  No acute cardiopulmonary abnormality identified.

## 2020-08-27 IMAGING — DX DG CHEST 2V
2 series · 2 of 2 positions shown · non-contrast
Comparison: Chest radiographs 03/11/2018 and earlier.

CLINICAL DATA: [AGE] female with near-syncope.

EXAM:
CHEST - 2 VIEW

[chest lat]
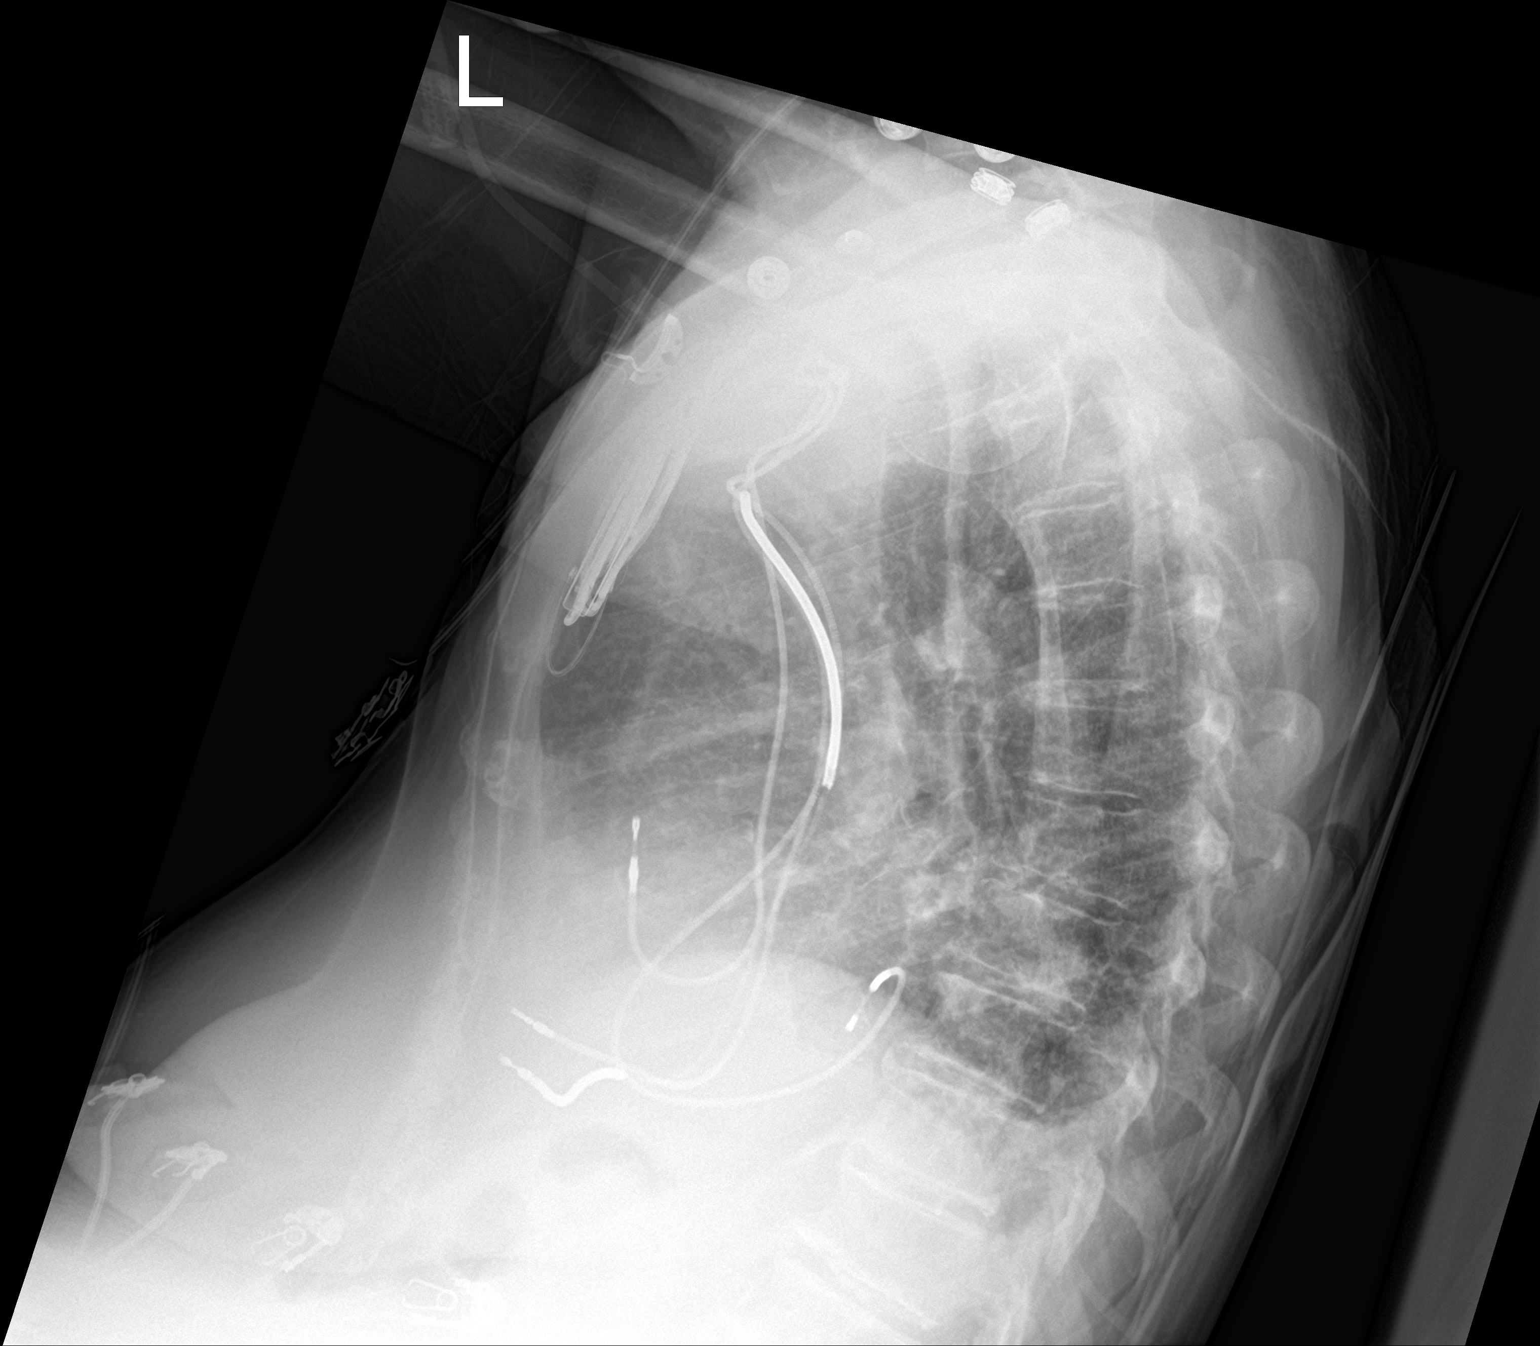

[chest ap]
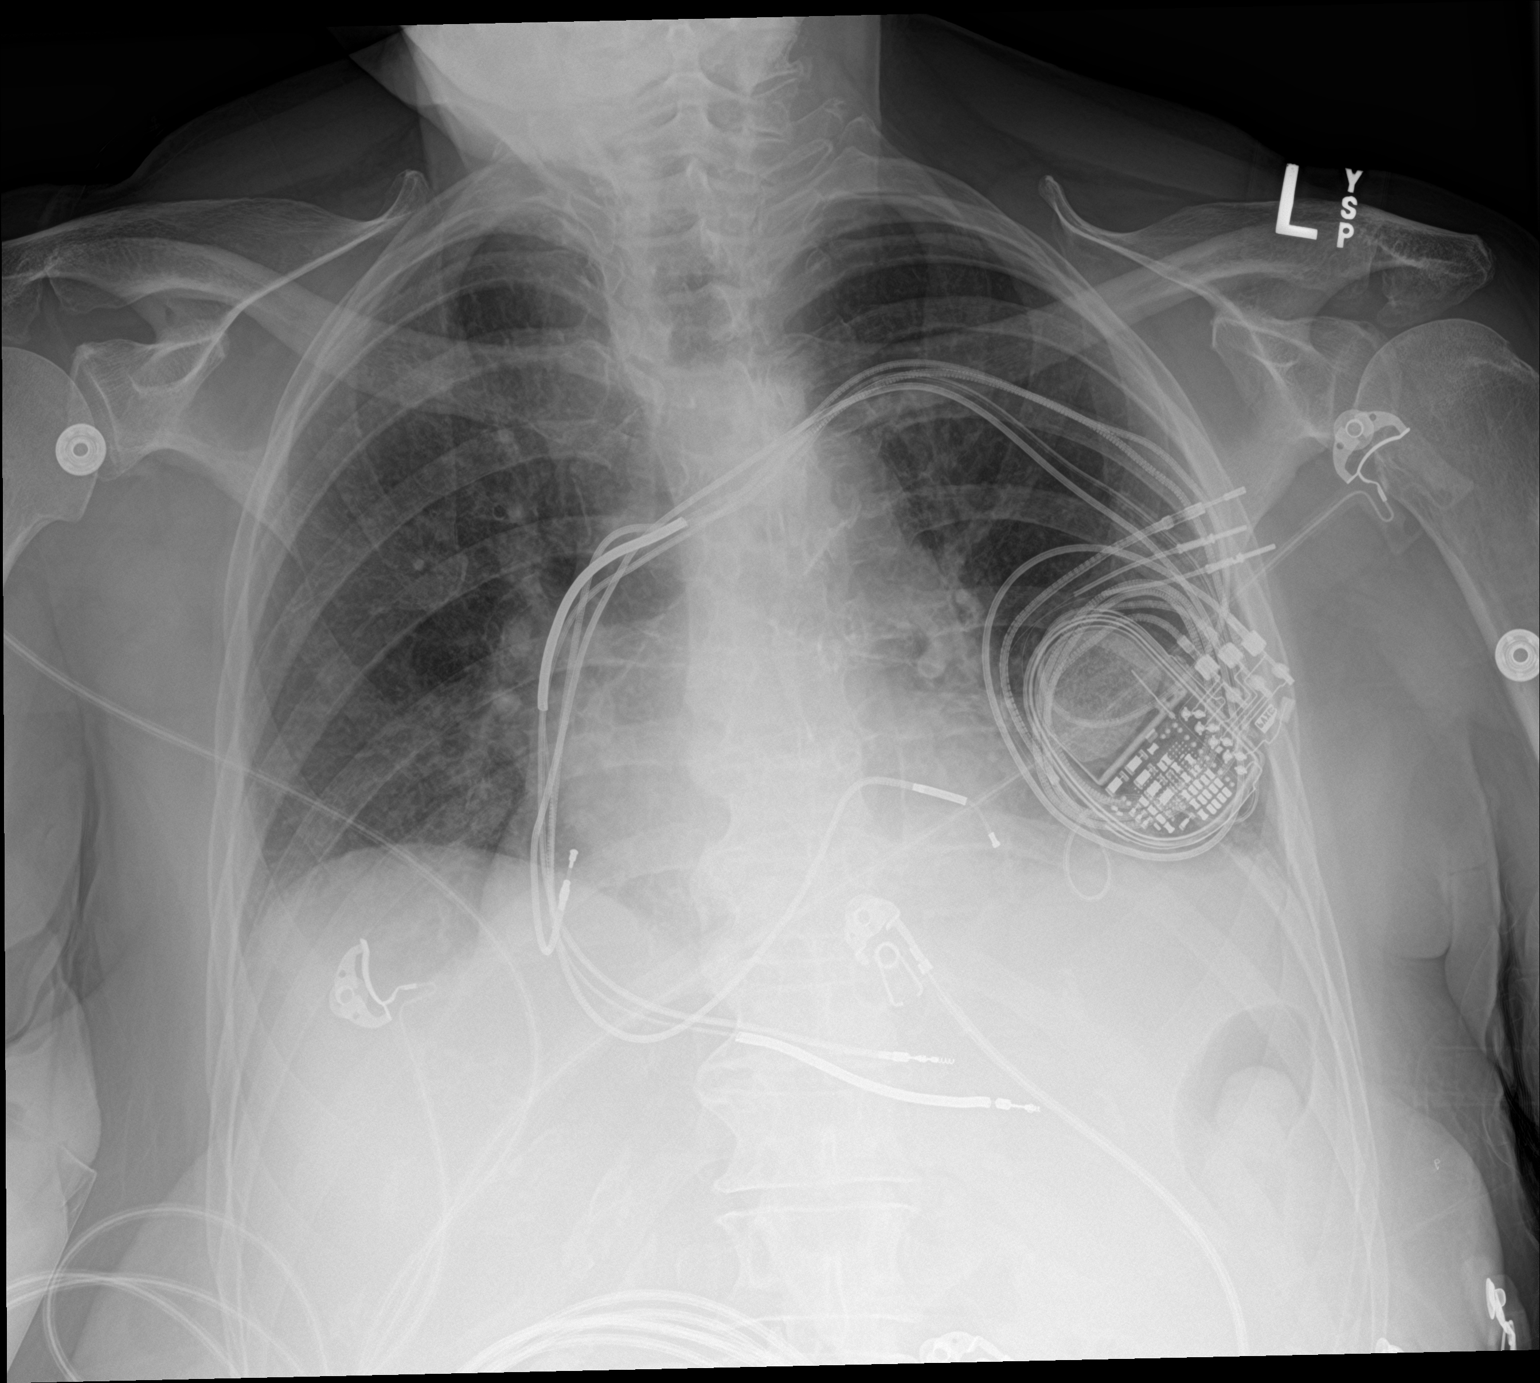

[2 of 2 positions shown; findings below may reference images not displayed]

FINDINGS: Chronic cardiomegaly and low lung volumes. Stable left chest AICD.
No pneumothorax, pulmonary edema, pleural effusion or confluent
pulmonary opacity. No acute osseous abnormality identified. Negative
visible bowel gas pattern.
IMPRESSION: Stable.  No acute cardiopulmonary abnormality.

## 2020-10-05 IMAGING — CR DG CHEST 2V
2 series · 2 of 2 positions shown · non-contrast
Comparison: 10/03/2018

CLINICAL DATA: Syncopal episode on the toilet today.

EXAM:
CHEST - 2 VIEW

[chest ap]
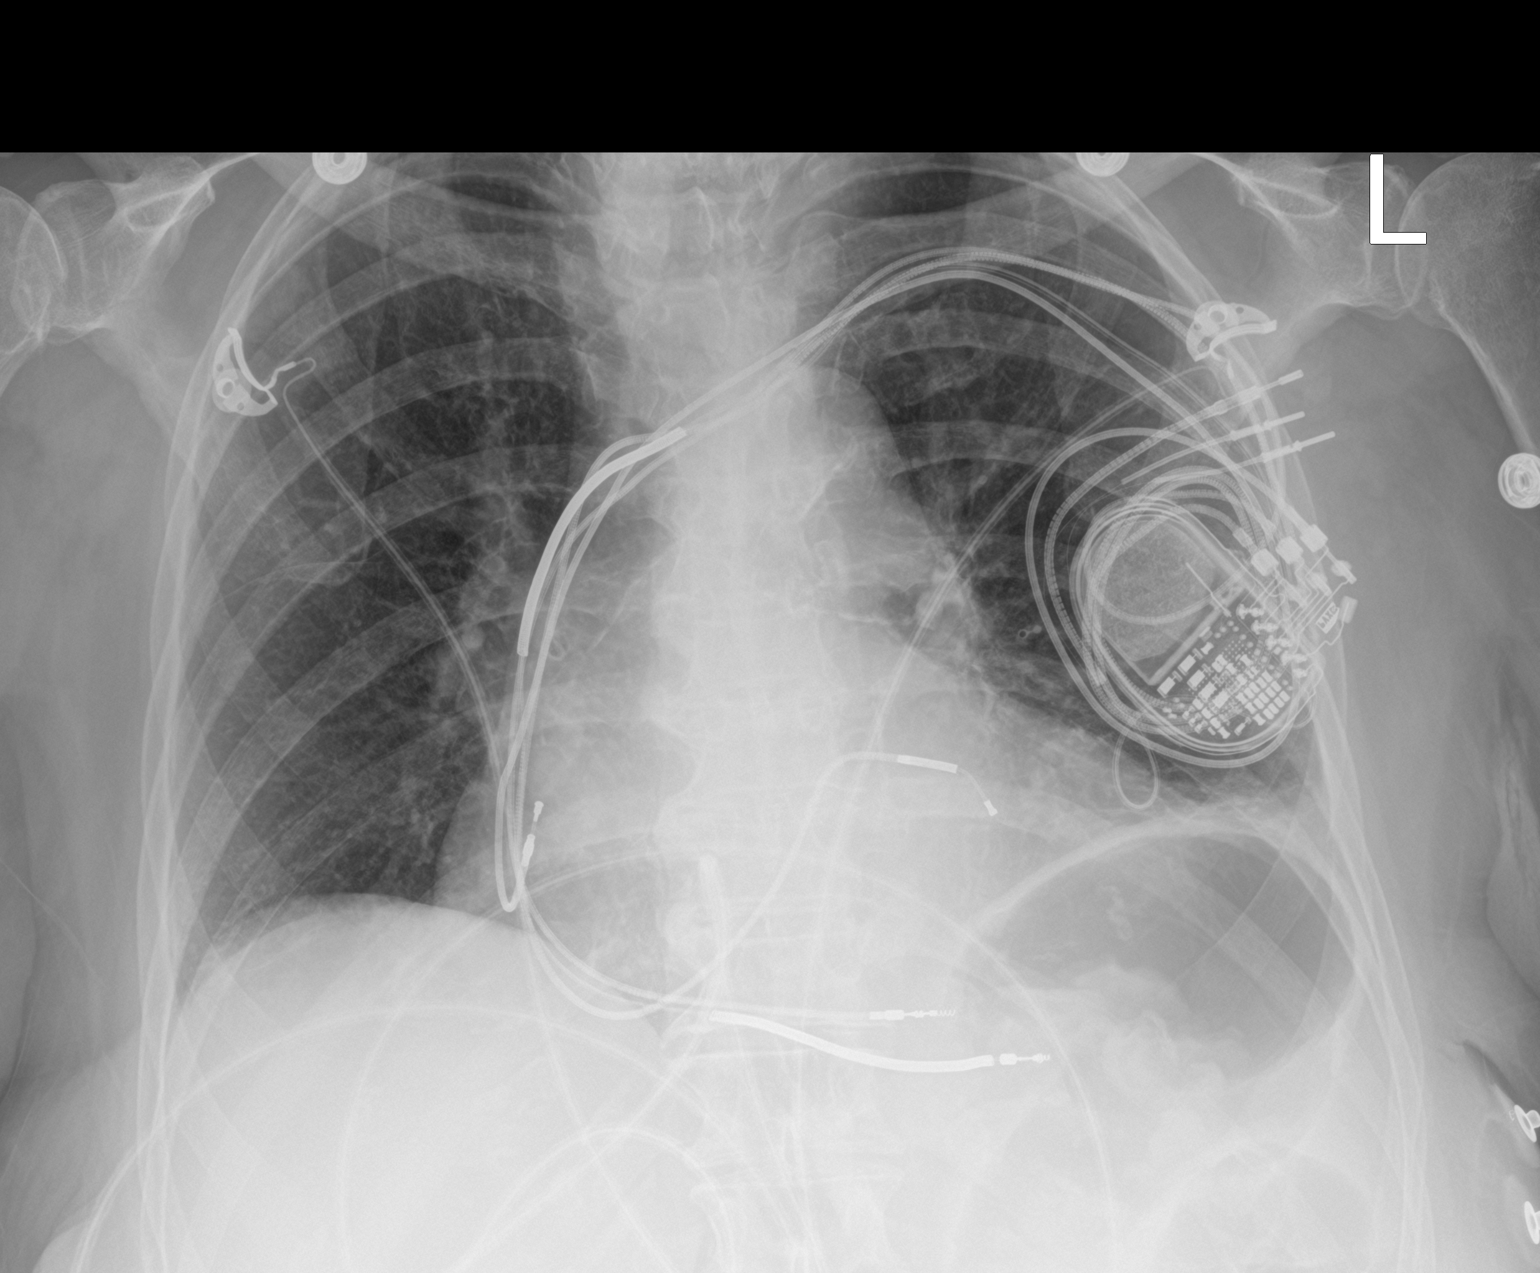

[chest lat]
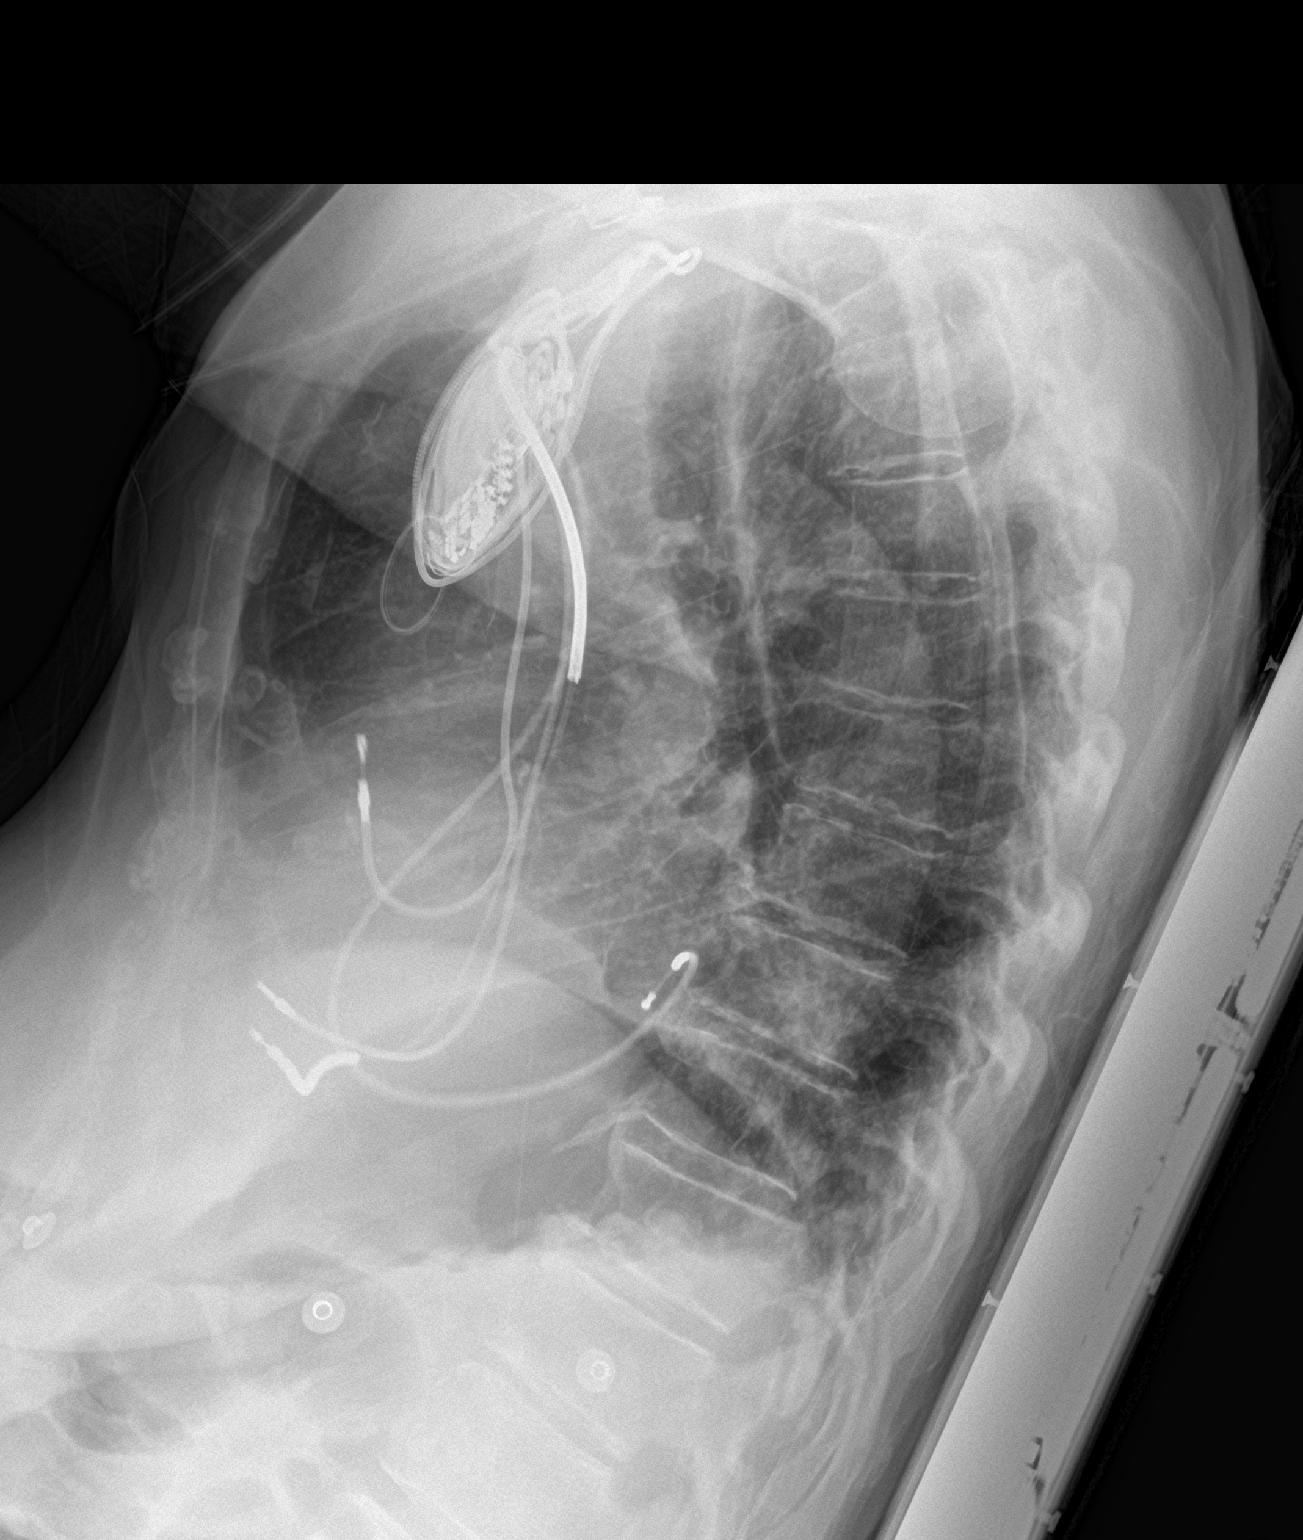

[2 of 2 positions shown; findings below may reference images not displayed]

FINDINGS: Stable cardiomegaly with tortuous atherosclerotic aorta. ICD device
with leads in the right atrium, coronary sinus and right ventricle
are noted as before. Left basilar atelectasis is seen. No acute
pulmonary consolidation, effusion or pneumothorax. Degenerative
changes are stable along the dorsal spine. No acute osseous
appearing abnormality.
IMPRESSION: Cardiomegaly with aortic atherosclerosis. Left basilar atelectasis.
ICD device in place.
